# Patient Record
Sex: Female | Born: 1935
Health system: Southern US, Community
[De-identification: ages and names within clinical notes are randomized; demographics above are authoritative.]

## PROBLEM LIST (undated history)

## (undated) DIAGNOSIS — E7800 Pure hypercholesterolemia, unspecified: Secondary | ICD-10-CM

## (undated) DIAGNOSIS — J849 Interstitial pulmonary disease, unspecified: Secondary | ICD-10-CM

## (undated) DIAGNOSIS — N289 Disorder of kidney and ureter, unspecified: Secondary | ICD-10-CM

## (undated) DIAGNOSIS — J45909 Unspecified asthma, uncomplicated: Secondary | ICD-10-CM

## (undated) DIAGNOSIS — K219 Gastro-esophageal reflux disease without esophagitis: Secondary | ICD-10-CM

## (undated) DIAGNOSIS — J40 Bronchitis, not specified as acute or chronic: Secondary | ICD-10-CM

## (undated) DIAGNOSIS — E079 Disorder of thyroid, unspecified: Secondary | ICD-10-CM

## (undated) DIAGNOSIS — D509 Iron deficiency anemia, unspecified: Secondary | ICD-10-CM

## (undated) DIAGNOSIS — I219 Acute myocardial infarction, unspecified: Secondary | ICD-10-CM

## (undated) DIAGNOSIS — I10 Essential (primary) hypertension: Secondary | ICD-10-CM

## (undated) DIAGNOSIS — J449 Chronic obstructive pulmonary disease, unspecified: Secondary | ICD-10-CM

## (undated) DIAGNOSIS — I2510 Atherosclerotic heart disease of native coronary artery without angina pectoris: Secondary | ICD-10-CM

## (undated) HISTORY — DX: Iron deficiency anemia, unspecified: D50.9

## (undated) HISTORY — DX: Unspecified asthma, uncomplicated: J45.909

## (undated) HISTORY — PX: OTHER SURGICAL HISTORY: SHX169

## (undated) HISTORY — DX: Disorder of kidney and ureter, unspecified: N28.9

## (undated) HISTORY — DX: Acute myocardial infarction, unspecified: I21.9

## (undated) HISTORY — PX: CORONARY ANGIOPLASTY WITH STENT PLACEMENT: SHX49

## (undated) HISTORY — PX: APPENDECTOMY: SHX54

## (undated) HISTORY — PX: BREAST BIOPSY: SHX20

---

## 1978-10-22 HISTORY — PX: PARTIAL HYSTERECTOMY: SHX80

## 1979-09-22 HISTORY — PX: CORONARY ARTERY BYPASS GRAFT: SHX141

## 1998-12-27 ENCOUNTER — Ambulatory Visit (HOSPITAL_BASED_OUTPATIENT_CLINIC_OR_DEPARTMENT_OTHER): Admission: RE | Admit: 1998-12-27 | Discharge: 1998-12-27 | Payer: Self-pay | Admitting: Orthopedic Surgery

## 2000-07-25 ENCOUNTER — Ambulatory Visit (HOSPITAL_COMMUNITY): Admission: RE | Admit: 2000-07-25 | Discharge: 2000-07-25 | Payer: Self-pay | Admitting: Orthopedic Surgery

## 2000-07-25 ENCOUNTER — Encounter: Payer: Self-pay | Admitting: Orthopedic Surgery

## 2000-08-01 ENCOUNTER — Other Ambulatory Visit: Admission: RE | Admit: 2000-08-01 | Discharge: 2000-08-01 | Payer: Self-pay | Admitting: Orthopedic Surgery

## 2000-08-08 ENCOUNTER — Ambulatory Visit (HOSPITAL_COMMUNITY): Admission: RE | Admit: 2000-08-08 | Discharge: 2000-08-08 | Payer: Self-pay | Admitting: Orthopedic Surgery

## 2000-08-08 ENCOUNTER — Encounter: Payer: Self-pay | Admitting: Orthopedic Surgery

## 2000-08-22 ENCOUNTER — Encounter: Payer: Self-pay | Admitting: Orthopedic Surgery

## 2000-08-22 ENCOUNTER — Ambulatory Visit (HOSPITAL_COMMUNITY): Admission: RE | Admit: 2000-08-22 | Discharge: 2000-08-22 | Payer: Self-pay | Admitting: Orthopedic Surgery

## 2000-09-27 ENCOUNTER — Encounter: Payer: Self-pay | Admitting: Neurological Surgery

## 2000-10-01 ENCOUNTER — Inpatient Hospital Stay (HOSPITAL_COMMUNITY): Admission: RE | Admit: 2000-10-01 | Discharge: 2000-10-02 | Payer: Self-pay | Admitting: Neurological Surgery

## 2000-10-02 ENCOUNTER — Encounter: Payer: Self-pay | Admitting: Neurological Surgery

## 2011-10-23 ENCOUNTER — Encounter: Payer: Self-pay | Admitting: Emergency Medicine

## 2011-10-23 ENCOUNTER — Emergency Department (INDEPENDENT_AMBULATORY_CARE_PROVIDER_SITE_OTHER): Payer: Medicare Other

## 2011-10-23 ENCOUNTER — Emergency Department (HOSPITAL_BASED_OUTPATIENT_CLINIC_OR_DEPARTMENT_OTHER)
Admission: EM | Admit: 2011-10-23 | Discharge: 2011-10-23 | Disposition: A | Discharge status: Home / Self Care | Payer: Medicare Other | Attending: Emergency Medicine | Admitting: Emergency Medicine

## 2011-10-23 DIAGNOSIS — E7800 Pure hypercholesterolemia, unspecified: Secondary | ICD-10-CM | POA: Diagnosis not present

## 2011-10-23 DIAGNOSIS — I10 Essential (primary) hypertension: Secondary | ICD-10-CM | POA: Insufficient documentation

## 2011-10-23 DIAGNOSIS — R059 Cough, unspecified: Secondary | ICD-10-CM | POA: Diagnosis not present

## 2011-10-23 DIAGNOSIS — J449 Chronic obstructive pulmonary disease, unspecified: Secondary | ICD-10-CM | POA: Diagnosis not present

## 2011-10-23 DIAGNOSIS — K219 Gastro-esophageal reflux disease without esophagitis: Secondary | ICD-10-CM | POA: Diagnosis not present

## 2011-10-23 DIAGNOSIS — R5381 Other malaise: Secondary | ICD-10-CM | POA: Diagnosis not present

## 2011-10-23 DIAGNOSIS — I2510 Atherosclerotic heart disease of native coronary artery without angina pectoris: Secondary | ICD-10-CM | POA: Diagnosis not present

## 2011-10-23 DIAGNOSIS — R6889 Other general symptoms and signs: Secondary | ICD-10-CM

## 2011-10-23 DIAGNOSIS — E079 Disorder of thyroid, unspecified: Secondary | ICD-10-CM | POA: Insufficient documentation

## 2011-10-23 DIAGNOSIS — R05 Cough: Secondary | ICD-10-CM

## 2011-10-23 DIAGNOSIS — R5383 Other fatigue: Secondary | ICD-10-CM | POA: Diagnosis not present

## 2011-10-23 DIAGNOSIS — Z79899 Other long term (current) drug therapy: Secondary | ICD-10-CM | POA: Insufficient documentation

## 2011-10-23 DIAGNOSIS — R52 Pain, unspecified: Secondary | ICD-10-CM | POA: Diagnosis not present

## 2011-10-23 DIAGNOSIS — J4489 Other specified chronic obstructive pulmonary disease: Secondary | ICD-10-CM | POA: Insufficient documentation

## 2011-10-23 HISTORY — DX: Gastro-esophageal reflux disease without esophagitis: K21.9

## 2011-10-23 HISTORY — DX: Essential (primary) hypertension: I10

## 2011-10-23 HISTORY — DX: Chronic obstructive pulmonary disease, unspecified: J44.9

## 2011-10-23 HISTORY — DX: Atherosclerotic heart disease of native coronary artery without angina pectoris: I25.10

## 2011-10-23 HISTORY — DX: Bronchitis, not specified as acute or chronic: J40

## 2011-10-23 HISTORY — DX: Pure hypercholesterolemia, unspecified: E78.00

## 2011-10-23 HISTORY — DX: Disorder of thyroid, unspecified: E07.9

## 2011-10-23 MED ORDER — PREDNISONE 20 MG PO TABS: 40.0000 mg | ORAL_TABLET | Freq: Two times a day (BID) | ORAL | Status: DC

## 2011-10-23 MED ORDER — HYDROCOD POLST-CHLORPHEN POLST 10-8 MG/5ML PO LQCR: 5.0000 mL | Freq: Two times a day (BID) | ORAL | Status: DC | PRN

## 2011-10-23 NOTE — ED Notes (Addendum)
Runny nose, cough for several days.  Productive white sputum occasional.  Generalized aches.  Decreased energy.  No N/V/D.   Pt also states she has been having left lower arm pain for 2 months.

## 2011-10-23 NOTE — ED Provider Notes (Signed)
Medical screening examination/treatment/procedure(s) were performed by non-physician practitioner and as supervising physician I was immediately available for consultation/collaboration.   Forbes Cellar, MD 10/23/11 [phone]

## 2011-10-23 NOTE — ED Provider Notes (Signed)
History     CSN: [id number]  Arrival date & time 10/23/11  1423   First MD Initiated Contact with Patient 10/23/11 1529      Chief Complaint  Patient presents with  . Cough    (Consider location/radiation/quality/duration/timing/severity/associated sxs/prior treatment) HPI Comments: Pt states that she has a history of this and steroids and tessalon perles is usually what her doctor gives her:pt states that the perles are not working:pt states that she stopped smoking in 1981, but her husband is a smoker:pt states that she will intermittently have pain in her left forearm and it resolves on it own:pt is not having any pain today:pt states that they moved here about a month ago from [place] and they don't have a pcp yet  Patient is a 78 y.o. female presenting with cough. The history is provided by the patient. No language interpreter was used.  Cough This is a new problem. The current episode started more than 2 days ago. The problem occurs hourly. The problem has not changed since onset.The cough is productive of sputum. There has been no fever. Associated symptoms include rhinorrhea, sore throat and wheezing. Pertinent negatives include no headaches and no shortness of breath. She has tried decongestants and cough syrup for the symptoms. She is not a smoker.    Past Medical History  Diagnosis Date  . Bronchitis   . Hypertension   . Thyroid disease   . Coronary artery disease   . Hypercholesteremia   . GERD (gastroesophageal reflux disease)   . COPD (chronic obstructive pulmonary disease)     Past Surgical History  Procedure Date  . Coronary artery bypass graft   . Coronary angioplasty with stent placement     History reviewed. No pertinent family history.  History  Substance Use Topics  . Smoking status: Never Smoker   . Smokeless tobacco: Not on file  . Alcohol Use: Yes     occasional    OB History    Grav Para Term Preterm Abortions TAB SAB Ect Mult Living           Review of Systems  HENT: Positive for sore throat and rhinorrhea.   Respiratory: Positive for cough and wheezing. Negative for shortness of breath.   Neurological: Negative for headaches.  All other systems reviewed and are negative.    Allergies  Codeine  Home Medications   Current Outpatient Rx  Name Route Sig Dispense Refill  . IPRATROPIUM-ALBUTEROL 18-103 MCG/ACT IN AERO Inhalation Inhale 2 puffs into the lungs 4 (four) times daily.      [name] AMITRIPTYLINE HCL 25 MG PO TABS Oral Take 25 mg by mouth at bedtime.     . ASPIRIN 81 MG PO TABS Oral Take 160 mg by mouth daily.      Kimberlee Nearing PERLES PO Oral Take 1 capsule by mouth 2 (two) times daily as needed.      [name] CALCIUM CARBONATE 600 MG PO TABS Oral Take 600 mg by mouth 2 (two) times daily.      [name] CETIRIZINE HCL 10 MG PO TABS Oral Take 10 mg by mouth daily.      . COLESEVELAM HCL 625 MG PO TABS Oral Take 1,875 mg by mouth 2 (two) times daily with a meal.      . ESOMEPRAZOLE MAGNESIUM 40 MG PO CPDR Oral Take 40 mg by mouth 2 (two) times daily.      . FUROSEMIDE 40 MG PO TABS Oral Take 40 mg by mouth daily.  For fluid     . ISOSORBIDE MONONITRATE ER 60 MG PO TB24 Oral Take 60 mg by mouth daily.      [name] LEVOTHYROXINE SODIUM 150 MCG PO TABS Oral Take 150 mcg by mouth daily.      [name] METOPROLOL SUCCINATE ER 50 MG PO TB24 Oral Take 50 mg by mouth daily.      [name] POLYETHYL GLYCOL-PROPYL GLYCOL 0.4-0.3 % OP SOLN Ophthalmic Apply 1 drop to eye daily.      [name] PRAVASTATIN SODIUM 40 MG PO TABS Oral Take 40 mg by mouth at bedtime as needed.     [name] SPIRONOLACTONE-HCTZ 25-25 MG PO TABS Oral Take 1 tablet by mouth daily.      . TRAMADOL HCL 50 MG PO TABS Oral Take 50 mg by mouth 2 (two) times daily as needed. For pain. Maximum dose= 8 tablets per day    . VALSARTAN 320 MG PO TABS Oral Take 320 mg by mouth daily.        BP 135/66  Pulse 79  Temp(Src) 98.7 F (37.1 C) (Oral)  Resp 16  SpO2 96%  Physical Exam  Nursing note and vitals  reviewed. Constitutional: She is oriented to person, place, and time. She appears well-developed and well-nourished.  HENT:  Head: Normocephalic and atraumatic.  Right Ear: External ear normal.  Nose: Rhinorrhea present.  Neck: Neck supple.  Cardiovascular: Normal rate and regular rhythm.   Pulmonary/Chest: No respiratory distress. She has rales.  Musculoskeletal: Normal range of motion.  Neurological: She is alert and oriented to person, place, and time.  Skin: Skin is warm and dry.  Psychiatric: She has a normal mood and affect.    ED Course  Procedures (including critical care time)  Labs Reviewed - No data to display Dg Chest 2 View  10/23/2011  *RADIOLOGY REPORT*  Clinical Data: Cough.  Runny nose for several days.  Productive, white sputum.  Generalized aches.  Decreased energy.  History of bronchitis, hypertension, CAD, COPD.  CHEST - 2 VIEW  Comparison: None.  Findings: The patient has had median sternotomy.  Heart is upper limits normal in size.  There are no focal consolidations or pleural effusions.  No pulmonary edema.  Degenerative changes are seen in the spine.  IMPRESSION: No evidence for acute cardiopulmonary abnormality.  Original Report Authenticated By: Patterson Hammersmith, M.D.     1. Cough       MDM  No acute findings on chest x-ray:will treat symptomatically:no intervention need for the arm pain as pt is not having it today       Teressa Lower, NP 10/23/11 1609

## 2011-10-30 DIAGNOSIS — J449 Chronic obstructive pulmonary disease, unspecified: Secondary | ICD-10-CM | POA: Diagnosis not present

## 2011-10-30 DIAGNOSIS — J40 Bronchitis, not specified as acute or chronic: Secondary | ICD-10-CM | POA: Diagnosis not present

## 2011-10-30 DIAGNOSIS — I10 Essential (primary) hypertension: Secondary | ICD-10-CM | POA: Diagnosis not present

## 2011-10-30 DIAGNOSIS — I2510 Atherosclerotic heart disease of native coronary artery without angina pectoris: Secondary | ICD-10-CM | POA: Diagnosis not present

## 2011-10-30 DIAGNOSIS — E039 Hypothyroidism, unspecified: Secondary | ICD-10-CM | POA: Diagnosis not present

## 2011-10-30 DIAGNOSIS — E785 Hyperlipidemia, unspecified: Secondary | ICD-10-CM | POA: Diagnosis not present

## 2011-10-30 DIAGNOSIS — K219 Gastro-esophageal reflux disease without esophagitis: Secondary | ICD-10-CM | POA: Diagnosis not present

## 2011-11-13 DIAGNOSIS — I10 Essential (primary) hypertension: Secondary | ICD-10-CM | POA: Diagnosis not present

## 2011-11-13 DIAGNOSIS — J40 Bronchitis, not specified as acute or chronic: Secondary | ICD-10-CM | POA: Diagnosis not present

## 2012-02-12 DIAGNOSIS — L821 Other seborrheic keratosis: Secondary | ICD-10-CM | POA: Diagnosis not present

## 2012-02-12 DIAGNOSIS — D235 Other benign neoplasm of skin of trunk: Secondary | ICD-10-CM | POA: Diagnosis not present

## 2012-02-12 DIAGNOSIS — L820 Inflamed seborrheic keratosis: Secondary | ICD-10-CM | POA: Diagnosis not present

## 2012-02-12 DIAGNOSIS — L570 Actinic keratosis: Secondary | ICD-10-CM | POA: Diagnosis not present

## 2012-02-13 DIAGNOSIS — I10 Essential (primary) hypertension: Secondary | ICD-10-CM | POA: Diagnosis not present

## 2012-02-13 DIAGNOSIS — J449 Chronic obstructive pulmonary disease, unspecified: Secondary | ICD-10-CM | POA: Diagnosis not present

## 2012-02-13 DIAGNOSIS — J309 Allergic rhinitis, unspecified: Secondary | ICD-10-CM | POA: Diagnosis not present

## 2012-03-11 DIAGNOSIS — J449 Chronic obstructive pulmonary disease, unspecified: Secondary | ICD-10-CM | POA: Diagnosis not present

## 2012-03-11 DIAGNOSIS — R05 Cough: Secondary | ICD-10-CM | POA: Diagnosis not present

## 2012-03-11 DIAGNOSIS — D649 Anemia, unspecified: Secondary | ICD-10-CM | POA: Diagnosis not present

## 2012-03-11 DIAGNOSIS — I10 Essential (primary) hypertension: Secondary | ICD-10-CM | POA: Diagnosis not present

## 2012-03-11 DIAGNOSIS — R4189 Other symptoms and signs involving cognitive functions and awareness: Secondary | ICD-10-CM | POA: Diagnosis not present

## 2012-03-12 ENCOUNTER — Other Ambulatory Visit: Payer: Self-pay | Admitting: Family Medicine

## 2012-03-12 DIAGNOSIS — L570 Actinic keratosis: Secondary | ICD-10-CM | POA: Diagnosis not present

## 2012-03-12 DIAGNOSIS — R4189 Other symptoms and signs involving cognitive functions and awareness: Secondary | ICD-10-CM

## 2012-03-12 DIAGNOSIS — L821 Other seborrheic keratosis: Secondary | ICD-10-CM | POA: Diagnosis not present

## 2012-03-13 ENCOUNTER — Ambulatory Visit
Admission: RE | Admit: 2012-03-13 | Discharge: 2012-03-13 | Disposition: A | Discharge status: Home / Self Care | Payer: Medicare Other | Source: Ambulatory Visit | Attending: Family Medicine | Admitting: Family Medicine

## 2012-03-13 ENCOUNTER — Telehealth: Payer: Self-pay | Admitting: Oncology

## 2012-03-13 DIAGNOSIS — R4189 Other symptoms and signs involving cognitive functions and awareness: Secondary | ICD-10-CM

## 2012-03-13 DIAGNOSIS — I10 Essential (primary) hypertension: Secondary | ICD-10-CM | POA: Diagnosis not present

## 2012-03-13 DIAGNOSIS — I6350 Cerebral infarction due to unspecified occlusion or stenosis of unspecified cerebral artery: Secondary | ICD-10-CM | POA: Diagnosis not present

## 2012-03-13 DIAGNOSIS — I6529 Occlusion and stenosis of unspecified carotid artery: Secondary | ICD-10-CM | POA: Diagnosis not present

## 2012-03-13 DIAGNOSIS — F29 Unspecified psychosis not due to a substance or known physiological condition: Secondary | ICD-10-CM | POA: Diagnosis not present

## 2012-03-13 DIAGNOSIS — R51 Headache: Secondary | ICD-10-CM | POA: Diagnosis not present

## 2012-03-13 DIAGNOSIS — R413 Other amnesia: Secondary | ICD-10-CM | POA: Diagnosis not present

## 2012-03-13 NOTE — Telephone Encounter (Signed)
called pt and scheduled appt for 06/07.  faxed over a letter to Dr. Barbaraann Barthel

## 2012-03-14 ENCOUNTER — Telehealth: Payer: Self-pay | Admitting: Oncology

## 2012-03-14 NOTE — Telephone Encounter (Signed)
Referred by Dr. Barbaraann Barthel Dx- Chronic Anemia

## 2012-03-20 ENCOUNTER — Ambulatory Visit (INDEPENDENT_AMBULATORY_CARE_PROVIDER_SITE_OTHER): Payer: Medicare Other | Admitting: Pulmonary Disease

## 2012-03-20 ENCOUNTER — Encounter: Payer: Self-pay | Admitting: Oncology

## 2012-03-20 ENCOUNTER — Encounter: Payer: Self-pay | Admitting: Pulmonary Disease

## 2012-03-20 VITALS — BP 102/64 | HR 55 | Temp 98.1°F | Ht 63.0 in | Wt 200.2 lb

## 2012-03-20 DIAGNOSIS — R05 Cough: Secondary | ICD-10-CM | POA: Diagnosis not present

## 2012-03-20 DIAGNOSIS — N1832 Chronic kidney disease, stage 3b: Secondary | ICD-10-CM | POA: Insufficient documentation

## 2012-03-20 DIAGNOSIS — D509 Iron deficiency anemia, unspecified: Secondary | ICD-10-CM | POA: Insufficient documentation

## 2012-03-20 NOTE — Progress Notes (Signed)
  Subjective:    Patient ID: Cindy [name], female    DOB: Jul 22, 1932, 78 y.o.   MRN: [id number]  HPI PCP  [facility]- [name] 78 year old remote ex-smoker for evaluation of persistent wheezing and cough. pt c/o Wheezing and cough w/ occasional yellow-cream color phlem, sob w/ ctivity occasionally x 2 1/2 years. cough worse late afternoon and at night, wheezing last about all day. Had a CABG in 81 and takes occasional sublingual nitroglycerin at night but denies chest pain, orthopnea or paroxysmal nocturnal dyspnea. She is on Lasix for pedal edema . Tessalon Perles have not helped. Tussionex caused her to be drowsy in 911 had to be called. Chest x-ray from 03/11/2012 is reported normal. From 1/13 shows minimal scarring and prominent interstitium. She was recently given a Z-Pak. She's been using Advair for a week with some improvement. She uses Zyrtec for allergies and denies postnasal drip. She uses Nexium twice a day and denies obvious heartburn or belching.  Meds reviewed shows a regimen suggestive of heart failure. She smoked 1.5 packs per day for 25 years before quitting in 83 at the time of her bypass.  Denies occupational or environmental exposure to mold or dust. She has not been formally allergy tested. There is no childhood history of asthma  Past Medical History  Diagnosis Date  . Bronchitis   . Hypertension   . Thyroid disease   . Coronary artery disease     MI age 78  . Hypercholesteremia   . GERD (gastroesophageal reflux disease)   . COPD (chronic obstructive pulmonary disease)   . Iron deficiency anemia     used to see hem in [place], [place].   [name] Renal insufficiency   . Asthma   . Heart attack   . Skin cancer     Past Surgical History  Procedure Date  . Coronary artery bypass graft 09/1979  . Coronary angioplasty with stent placement   . Moh's surgeries     for squamous cell carcinoma  . Partial hysterectomy 10/1978  . Appendectomy     Allergies  Allergen  Reactions  . Codeine Other (See Comments)    Headache from heavy doses of codeine only     Review of Systems  Constitutional: Negative for fever, appetite change and unexpected weight change.  HENT: Positive for congestion and sneezing. Negative for ear pain, sore throat, trouble swallowing and dental problem.   Respiratory: Positive for cough, shortness of breath and wheezing.   Cardiovascular: Positive for leg swelling. Negative for chest pain and palpitations.  Gastrointestinal: Negative for abdominal pain.  Musculoskeletal: Positive for joint swelling.  Skin: Negative for rash.  Neurological: Negative for headaches.  Psychiatric/Behavioral: Negative for dysphoric mood. The patient is not nervous/anxious.        Objective:   Physical Exam  Gen. Pleasant, obese, in no distress, normal affect ENT - no lesions, no post nasal drip, class 2-3 airway Neck: No JVD, no thyromegaly, no carotid bruits Lungs: no use of accessory muscles, no dullness to percussion, decreased with bibasal L >> Rt rales no  rhonchi  Cardiovascular: Rhythm regular, heart sounds  normal, no murmurs or gallops, no peripheral edema Abdomen: soft and non-tender, no hepatosplenomegaly, BS normal. Musculoskeletal: No deformities, no cyanosis or clubbing Neuro:  alert, non focal, no tremors        Assessment & Plan:

## 2012-03-20 NOTE — Patient Instructions (Addendum)
[facility] Your cough & wheeze may be due to sinusitis or reflux or COPD You also have scarring at the bottom of oyur lungs CT chest Breathing test - PFTs Stay on advair twice daily in the meantime

## 2012-03-20 NOTE — Assessment & Plan Note (Signed)
Get records from Dr Barbaraann Barthel & dr Anne FuDeboraha Sprang - regarding her cardiac workup including echo Differential diagnosis of cough & wheeze includes upper airway cough syndrome/ sinusitis or nonacid reflux or COPD Bibasal crackles on exam and chest x-ray in the past showing prominent interstitial raises the possibility of pulmonary fibrosis. Hence a high resolution CT chest will be obtained looking for fibrosis or bronchiectasis PFTs will be obtained- airway obstruction may be due to COPD from her smoking in the past, restriction may be due to obesity a fibrosis.

## 2012-03-25 ENCOUNTER — Ambulatory Visit (INDEPENDENT_AMBULATORY_CARE_PROVIDER_SITE_OTHER)
Admission: RE | Admit: 2012-03-25 | Discharge: 2012-03-25 | Disposition: A | Discharge status: Home / Self Care | Payer: Medicare Other | Source: Ambulatory Visit | Attending: Pulmonary Disease | Admitting: Pulmonary Disease

## 2012-03-25 DIAGNOSIS — R05 Cough: Secondary | ICD-10-CM | POA: Diagnosis not present

## 2012-03-25 DIAGNOSIS — R918 Other nonspecific abnormal finding of lung field: Secondary | ICD-10-CM | POA: Diagnosis not present

## 2012-03-25 DIAGNOSIS — R062 Wheezing: Secondary | ICD-10-CM | POA: Diagnosis not present

## 2012-03-27 ENCOUNTER — Telehealth: Payer: Self-pay | Admitting: *Deleted

## 2012-03-27 ENCOUNTER — Telehealth: Payer: Self-pay | Admitting: Oncology

## 2012-03-27 DIAGNOSIS — R413 Other amnesia: Secondary | ICD-10-CM | POA: Diagnosis not present

## 2012-03-27 NOTE — Telephone Encounter (Signed)
Pt called,  States has conflict w/ her appt here tomorrow.  She cannot make it and needs to r/s.  Informed her to expect call from our scheduling dept to r/s.   I sent POF to scheduling to r/s new pt visit at pt's convenience.

## 2012-03-27 NOTE — Telephone Encounter (Signed)
called pts home [id number] that her appt on 06/07 was cancelled and to rtn call to r/s

## 2012-03-28 ENCOUNTER — Ambulatory Visit: Payer: Medicare Other | Admitting: Oncology

## 2012-03-28 ENCOUNTER — Other Ambulatory Visit (HOSPITAL_BASED_OUTPATIENT_CLINIC_OR_DEPARTMENT_OTHER): Payer: Medicare Other

## 2012-03-28 ENCOUNTER — Telehealth: Payer: Self-pay | Admitting: Oncology

## 2012-03-28 ENCOUNTER — Ambulatory Visit: Payer: Medicare Other

## 2012-03-28 ENCOUNTER — Encounter: Payer: Self-pay | Admitting: Oncology

## 2012-03-28 ENCOUNTER — Other Ambulatory Visit: Payer: Medicare Other | Admitting: Lab

## 2012-03-28 ENCOUNTER — Ambulatory Visit (HOSPITAL_BASED_OUTPATIENT_CLINIC_OR_DEPARTMENT_OTHER): Payer: Medicare Other | Admitting: Oncology

## 2012-03-28 VITALS — BP 137/74 | HR 65 | Temp 97.0°F | Ht 62.0 in | Wt 198.9 lb

## 2012-03-28 DIAGNOSIS — J984 Other disorders of lung: Secondary | ICD-10-CM | POA: Diagnosis not present

## 2012-03-28 DIAGNOSIS — I10 Essential (primary) hypertension: Secondary | ICD-10-CM

## 2012-03-28 DIAGNOSIS — N289 Disorder of kidney and ureter, unspecified: Secondary | ICD-10-CM

## 2012-03-28 DIAGNOSIS — N181 Chronic kidney disease, stage 1: Secondary | ICD-10-CM

## 2012-03-28 DIAGNOSIS — D649 Anemia, unspecified: Secondary | ICD-10-CM

## 2012-03-28 DIAGNOSIS — D509 Iron deficiency anemia, unspecified: Secondary | ICD-10-CM | POA: Diagnosis not present

## 2012-03-28 DIAGNOSIS — R413 Other amnesia: Secondary | ICD-10-CM | POA: Diagnosis not present

## 2012-03-28 DIAGNOSIS — D518 Other vitamin B12 deficiency anemias: Secondary | ICD-10-CM | POA: Diagnosis not present

## 2012-03-28 LAB — CBC WITH DIFFERENTIAL/PLATELET
BASO%: 0.8 % (ref 0.0–2.0)
Basophils Absolute: 0 10*3/uL (ref 0.0–0.1)
EOS%: 2.8 % (ref 0.0–7.0)
Eosinophils Absolute: 0.2 10*3/uL (ref 0.0–0.5)
HCT: 34.9 % (ref 34.8–46.6)
HGB: 12 g/dL (ref 11.6–15.9)
LYMPH%: 24.2 % (ref 14.0–49.7)
MCH: 31.9 pg (ref 25.1–34.0)
MCHC: 34.3 g/dL (ref 31.5–36.0)
MCV: 92.8 fL (ref 79.5–101.0)
MONO#: 0.6 10*3/uL (ref 0.1–0.9)
MONO%: 8.9 % (ref 0.0–14.0)
NEUT#: 4.2 10*3/uL (ref 1.5–6.5)
NEUT%: 63.3 % (ref 38.4–76.8)
Platelets: 209 10*3/uL (ref 145–400)
RBC: 3.76 10*6/uL (ref 3.70–5.45)
RDW: 13.1 % (ref 11.2–14.5)
WBC: 6.6 10*3/uL (ref 3.9–10.3)
lymph#: 1.6 10*3/uL (ref 0.9–3.3)

## 2012-03-28 LAB — MORPHOLOGY: PLT EST: ADEQUATE

## 2012-03-28 LAB — CHCC SMEAR

## 2012-03-28 NOTE — Progress Notes (Signed)
[facility]  [phone] [phone]     INITIAL HEMATOLOGY CONSULTATION    Referral MD:  Dr. Mayo Ao, M.D.  Reason for Referral: history of anemia of iron deficiency.     HPI:  [name] is a 78 year old woman with many medical issues.  She had history of iron deficiency and saw a hematologist in [facility] who gave her occasional iron injection.  She has previous EGD and colonoscopy which were reportedly negative.   She recently moved back to [facility] and established care with Dr. Luciana Axe.  She had regular CBC on 03/11/2012 which showed WBC 9.2; Hgb 11.9; Plt 178.  Her Cr was 1.36. Her TSH was 1.52; Ferrin was 158.  Given the mild anemia, she was kindly referred for evaluation.   Cindy [name] presented to the clinic for the first time to day with her husband.  She reported that she has mild fatigue; however, she is still independent of activities of daily living.  She has mild SOB and DOE.  She was recently seen by Pulmonary and was given a diagnosis of interstitial disease, NOS.  She has good appetite and has not had any weight loss.  In fact, she has problem losing weight.  She has mild nausea and dyspepsia depending on the kind of foods that she eats.   Patient denies headache, visual changes, confusion, drenching night sweats, palpable lymph node swelling, mucositis, odynophagia, dysphagia, nausea vomiting, jaundice, chest pain, palpitation, productive cough, gum bleeding, epistaxis, hematemesis, hemoptysis, abdominal pain, abdominal swelling, early satiety, melena, hematochezia, hematuria, skin rash, spontaneous bleeding, joint swelling, joint pain, heat or cold intolerance, bowel bladder incontinence, back pain, focal motor weakness, paresthesia, depression, suicidal or homocidal ideation, feeling hopelessness.   Past Medical History  Diagnosis Date  . Bronchitis   . Hypertension   . Thyroid disease   . Coronary artery disease     MI age  39  . Hypercholesteremia   . GERD (gastroesophageal reflux disease)   . COPD (chronic obstructive pulmonary disease)   . Iron deficiency anemia     used to see hem in [place], [place].   [name] Renal insufficiency   . Asthma   . Heart attack   . Skin cancer   :    Past Surgical History  Procedure Date  . Coronary artery bypass graft 09/1979  . Coronary angioplasty with stent placement   . Moh's surgeries     for squamous cell carcinoma  . Partial hysterectomy 10/1978  . Appendectomy   :   CURRENT MEDS: Current Outpatient Prescriptions  Medication Sig Dispense Refill  . ADVAIR DISKUS 250-50 MCG/DOSE AEPB 1 puff twice a day      . albuterol-ipratropium (COMBIVENT) 18-103 MCG/ACT inhaler Inhale 2 puffs into the lungs 4 (four) times daily.        [name] amitriptyline (ELAVIL) 25 MG tablet Take 25 mg by mouth at bedtime.       [name] aspirin 81 MG tablet Take 81 mg by mouth daily.       . calcium-vitamin D (OSCAL WITH D) 500-200 MG-UNIT per tablet Take 1 tablet by mouth daily.      . cetirizine (ZYRTEC) 10 MG tablet Take 10 mg by mouth daily.        . colesevelam (WELCHOL) 625 MG tablet Take 1,875 mg by mouth 2 (two) times daily with a meal.        . esomeprazole (NEXIUM) 40 MG capsule Take 40  mg by mouth 2 (two) times daily.        . fluticasone (FLONASE) 50 MCG/ACT nasal spray 2 puffs once a day      . furosemide (LASIX) 40 MG tablet Take 40 mg by mouth daily. For fluid       . isosorbide mononitrate (IMDUR) 60 MG 24 hr tablet Take 60 mg by mouth daily.        [name] levothyroxine (SYNTHROID, LEVOTHROID) 150 MCG tablet Take 150 mcg by mouth daily.        . metoprolol (TOPROL-XL) 50 MG 24 hr tablet Take 50 mg by mouth daily.        Bertram Gala Glycol-Propyl Glycol (SYSTANE) 0.4-0.3 % SOLN Apply 1 drop to eye daily.        . pravastatin (PRAVACHOL) 40 MG tablet Take 40 mg by mouth daily.       [name] spironolactone-hydrochlorothiazide (ALDACTAZIDE) 25-25 MG per tablet Take 1 tablet by mouth daily.        .  traMADol (ULTRAM) 50 MG tablet Take 50 mg by mouth 2 (two) times daily as needed. For pain. Maximum dose= 8 tablets per day      . valsartan (DIOVAN) 320 MG tablet Take 160 mg by mouth daily.           Allergies  Allergen Reactions  . Codeine Other (See Comments)    Headache from heavy doses of codeine only  :  Family History  Problem Relation Age of Onset  . Melanoma Sister     x2  . Heart disease Father   . Heart disease Mother   :  History   Social History  . Marital Status: Married    Spouse Name: N/A    Number of Children: 2  . Years of Education: N/A   Occupational History  . retired     retired   Social History Main Topics  . Smoking status: Former Smoker -- 1.5 packs/day for 22 years    Types: Cigarettes    Quit date: 10/23/1979  . Smokeless tobacco: Never Used  . Alcohol Use: Yes     occasional  . Drug Use: No  . Sexually Active: Not on file   Other Topics Concern  . Not on file   Social History Narrative  . No narrative on file  :  REVIEW OF SYSTEM:  The rest of the 14-point review of sytem was negative.   Exam: ECOG 1  General:  Moderately obese woman, in no acute distress.  Eyes:  no scleral icterus.  ENT:  There were no oropharyngeal lesions.  Neck was without thyromegaly.  Lymphatics:  Negative cervical, supraclavicular or axillary adenopathy.  Respiratory: lungs were clear bilaterally without wheezing or crackles.  Cardiovascular:  Regular rate and rhythm, S1/S2, without murmur, rub or gallop.  There was no pedal edema.  GI:  abdomen was soft, obese, nontender, nondistended, without organomegaly.  Muscoloskeletal:  no spinal tenderness of palpation of vertebral spine.  Skin exam was without echymosis, petichae.  Neuro exam was nonfocal.  Patient was able to get on and off exam table with minor assistance.  Gait was normal.  Patient was alerted and oriented.  Attention was good.   Language was appropriate.  Mood was normal without depression.   Speech was not pressured.  Thought content was not tangential.    LABS:  Lab Results  Component Value Date   WBC 6.6 03/28/2012   HGB 12.0 03/28/2012   HCT 34.9 03/28/2012  PLT 209 03/28/2012     Blood smear review:   I personally reviewed the patient's peripheral blood smear today.  There was isocytosis.  There was no peripheral blast.  There was no schistocytosis, spherocytosis, target cell, rouleaux formation, tear drop cell.  There was no giant platelets or platelet clumps.     ASSESSMENT AND PLAN:   1.  Anemia: - Differential:  History of iron deficiency anemia with negative GI work up.  However, her ferritin in 02/2012 was normal ruling out iron deficiency at this time.  Her Hgb today is within normal range.  Her recent slight elevation of Cr, she may have anemia of chronic kidney disease. - work up:  SPEP/light chain to rule out myeloma. - treatment:  As her Hgb is normal today, no further work up beside myeloma and treatment are needed.   2.  Chronic kidney disease stage I:  (new compared to before).  Most likely due to HTN.  Again, I sent out SPEP/light chain to rule out myeloma.  I advised her to control her BP tightly to prevent worsening of her renal function.    3.  HTN:  She is on Lasix, Metoprolol; Valsartan,  Aldactone/HCTZ per PCP.  4.  Hyperlipidemia:  She is on Pravastatin per PCP.   5.  CAD:  She is on ASA, Metoprolol; Valsartan,  Pravastatin per PCP.   6.  Interstitial lung disease:  Most likely due to history of smoking in the past; combined with possible restrictive lung disease due to obesity.  She follow up with Dr. Vassie Loll.  She is on inhalers prn.  7.  Subcm lung nodules on CT chest on 03/2012.  Recommendation per radiology was for CT chest in about 6 months.  I will defer this to Dr. Vassie Loll who follows her lung disease.     8.  Follow up:  [facility] in about 3, and 6 months.  Return visit in about 9 months.     Thank you for this referral.      The length of time of the face-to-face encounter was 45 minutes. More than 50% of time was spent counseling and coordination of care.

## 2012-03-28 NOTE — Patient Instructions (Signed)
A.  Chronic anemia:  Most likely anemia of chronic kidney disease.  You do not have evidence of iron deficiency anemia at this time (due to normal ferritin).   B.  Treatment for anemia of chronic kidney disease: - Observation with blood check in 3 and 6 months. - Follow up with [place] in about 9 months.

## 2012-03-28 NOTE — Telephone Encounter (Signed)
appts made and printed for pt aom °

## 2012-04-01 ENCOUNTER — Ambulatory Visit (INDEPENDENT_AMBULATORY_CARE_PROVIDER_SITE_OTHER): Payer: Medicare Other | Admitting: Pulmonary Disease

## 2012-04-01 DIAGNOSIS — R05 Cough: Secondary | ICD-10-CM

## 2012-04-01 LAB — KAPPA/LAMBDA LIGHT CHAINS
Kappa:Lambda Ratio: 1.29 (ref 0.26–1.65)
Lambda Free Lght Chn: 1.65 mg/dL (ref 0.57–2.63)

## 2012-04-01 LAB — PROTEIN ELECTROPHORESIS, SERUM
Albumin ELP: 59.6 % (ref 55.8–66.1)
Alpha-2-Globulin: 13.4 % — ABNORMAL HIGH (ref 7.1–11.8)
Beta 2: 4.5 % (ref 3.2–6.5)
Beta Globulin: 6.6 % (ref 4.7–7.2)
Total Protein, Serum Electrophoresis: 6.5 g/dL (ref 6.0–8.3)

## 2012-04-01 LAB — PULMONARY FUNCTION TEST

## 2012-04-01 NOTE — Progress Notes (Signed)
Please see note dated on 03/28/12

## 2012-04-01 NOTE — Progress Notes (Signed)
PFT done today. 

## 2012-04-02 DIAGNOSIS — R413 Other amnesia: Secondary | ICD-10-CM | POA: Diagnosis not present

## 2012-05-06 ENCOUNTER — Ambulatory Visit (INDEPENDENT_AMBULATORY_CARE_PROVIDER_SITE_OTHER): Payer: Medicare Other | Admitting: Pulmonary Disease

## 2012-05-06 ENCOUNTER — Encounter: Payer: Self-pay | Admitting: Pulmonary Disease

## 2012-05-06 VITALS — BP 104/60 | HR 70 | Temp 98.0°F | Ht 63.0 in | Wt 200.8 lb

## 2012-05-06 DIAGNOSIS — R911 Solitary pulmonary nodule: Secondary | ICD-10-CM

## 2012-05-06 DIAGNOSIS — J8410 Pulmonary fibrosis, unspecified: Secondary | ICD-10-CM

## 2012-05-06 DIAGNOSIS — R05 Cough: Secondary | ICD-10-CM

## 2012-05-06 DIAGNOSIS — J849 Interstitial pulmonary disease, unspecified: Secondary | ICD-10-CM

## 2012-05-06 NOTE — Patient Instructions (Addendum)
Stay on tessalon perles at night Can take DELSYM 2 tsp as needed STOP taking advair You have mild scarring at the bases of the lungs with bronchiectasis - this predisposes you to bronchitis - call us if you get a chest cold You have a small nodule in your right lower lung - repeat scan in 6 m You have a collapsed vertebra in your thoracic spine

## 2012-05-06 NOTE — Progress Notes (Signed)
  Subjective:    Patient ID: Cindy [name], female    DOB: 1932/01/08, 78 y.o.   MRN: [id number]  HPI  PCP [facility]  Cards- [name]   78 year old remote ex-smoker for evaluation of persistent wheezing and cough.  pt c/o Wheezing and cough w/ occasional yellow-cream color phlem, sob w/ ctivity occasionally x 2 1/2 years. cough worse late afternoon and at night, wheezing last about all day.  Had a CABG in 81 and takes occasional sublingual nitroglycerin at night but denies chest pain, orthopnea or paroxysmal nocturnal dyspnea. She is on Lasix for pedal edema . Tessalon Perles have not helped. Tussionex caused her to be drowsy in 911 had to be called. Chest x-ray from 03/11/2012 is reported normal. From 1/13 shows minimal scarring and prominent interstitium.  She was recently given a Z-Pak. She's been using Advair for a week with some improvement.  She uses Zyrtec for allergies and denies postnasal drip.  She uses Nexium twice a day and denies obvious heartburn or belching.  Meds reviewed shows a regimen suggestive of heart failure, no ACE.  She smoked 1.5 packs per day for 25 years before quitting in 81 at the time of her bypass.  Denies occupational or environmental exposure to mold or dust. She has not been formally allergy tested. There is no childhood history of asthma   05/06/2012 Pt states her cough is 75% better from last visit, still has some wheezing. breathing has been pretty good  HRCT Appearance of the lung parenchyma is compatible with an underlying interstitial lung disease. the pattern is nonspecific, and could represent either early usual interstitial pneumonia (UIP), or nonspecific interstitial pneumonia (NSIP). Atherosclerosis, 8mm RLL nodule PFTs no airway obstruction, preserved lung volumes, isolated decrease in DLCO to 56% corrects for Va.  Review of Systems Patient denies significant dyspnea,cough, hemoptysis,  chest pain, palpitations, pedal edema, orthopnea, paroxysmal  nocturnal dyspnea, lightheadedness, nausea, vomiting, abdominal or  leg pains      Objective:   Physical Exam  Gen. Pleasant, obese, in no distress ENT - no lesions, no post nasal drip Neck: No JVD, no thyromegaly, no carotid bruits Lungs: no use of accessory muscles, no dullness to percussion, decreased without rales or rhonchi  Cardiovascular: Rhythm regular, heart sounds  normal, no murmurs or gallops, no peripheral edema Musculoskeletal: No deformities, no cyanosis or clubbing , no tremors       Assessment & Plan:

## 2012-05-08 DIAGNOSIS — J849 Interstitial pulmonary disease, unspecified: Secondary | ICD-10-CM | POA: Insufficient documentation

## 2012-05-08 NOTE — Assessment & Plan Note (Signed)
Combination of GERD/ upper airway cough/ ? ILD vs bronchiectasis Ct nexium Tessalon prn

## 2012-05-08 NOTE — Assessment & Plan Note (Signed)
FU CT in 83m

## 2012-05-08 NOTE — Assessment & Plan Note (Addendum)
HRCT 6/13 suggestive of UIP/ NSIP with mild bronchiectasis PFTs no airway obstruction, preserved lung volumes, isolated decrease in DLCO to 56% corrects for Va Observe for now - rpt PFTs in 6 m for worsening OK to stop advair - since no obstruction

## 2012-05-13 DIAGNOSIS — J84115 Respiratory bronchiolitis interstitial lung disease: Secondary | ICD-10-CM | POA: Diagnosis not present

## 2012-05-13 DIAGNOSIS — D638 Anemia in other chronic diseases classified elsewhere: Secondary | ICD-10-CM | POA: Diagnosis not present

## 2012-05-13 DIAGNOSIS — I10 Essential (primary) hypertension: Secondary | ICD-10-CM | POA: Diagnosis not present

## 2012-05-13 DIAGNOSIS — E785 Hyperlipidemia, unspecified: Secondary | ICD-10-CM | POA: Diagnosis not present

## 2012-05-13 DIAGNOSIS — E039 Hypothyroidism, unspecified: Secondary | ICD-10-CM | POA: Diagnosis not present

## 2012-06-09 DIAGNOSIS — I10 Essential (primary) hypertension: Secondary | ICD-10-CM | POA: Diagnosis not present

## 2012-06-09 DIAGNOSIS — E785 Hyperlipidemia, unspecified: Secondary | ICD-10-CM | POA: Diagnosis not present

## 2012-06-09 DIAGNOSIS — I2510 Atherosclerotic heart disease of native coronary artery without angina pectoris: Secondary | ICD-10-CM | POA: Diagnosis not present

## 2012-06-25 ENCOUNTER — Other Ambulatory Visit: Payer: Medicare Other | Admitting: Lab

## 2012-07-09 DIAGNOSIS — E785 Hyperlipidemia, unspecified: Secondary | ICD-10-CM | POA: Diagnosis not present

## 2012-07-15 DIAGNOSIS — D044 Carcinoma in situ of skin of scalp and neck: Secondary | ICD-10-CM | POA: Diagnosis not present

## 2012-07-15 DIAGNOSIS — L570 Actinic keratosis: Secondary | ICD-10-CM | POA: Diagnosis not present

## 2012-07-15 DIAGNOSIS — L280 Lichen simplex chronicus: Secondary | ICD-10-CM | POA: Diagnosis not present

## 2012-07-15 DIAGNOSIS — L820 Inflamed seborrheic keratosis: Secondary | ICD-10-CM | POA: Diagnosis not present

## 2012-07-17 DIAGNOSIS — Z961 Presence of intraocular lens: Secondary | ICD-10-CM | POA: Diagnosis not present

## 2012-07-17 DIAGNOSIS — H4320 Crystalline deposits in vitreous body, unspecified eye: Secondary | ICD-10-CM | POA: Diagnosis not present

## 2012-07-17 DIAGNOSIS — H40019 Open angle with borderline findings, low risk, unspecified eye: Secondary | ICD-10-CM | POA: Diagnosis not present

## 2012-07-17 DIAGNOSIS — H04129 Dry eye syndrome of unspecified lacrimal gland: Secondary | ICD-10-CM | POA: Diagnosis not present

## 2012-08-02 IMAGING — CT CT CHEST W/O CM
3 of 7 series · 13 of 36 positions shown, 15 images · non-contrast
Comparison: No priors.

CLINICAL DATA: History of chronic cough and wheezing for the past
few years.  Evaluate for pulmonary fibrosis or bronchiectasis.

CT CHEST WITHOUT CONTRAST
TECHNIQUE: Multidetector CT imaging of the chest was performed
following the standard protocol without IV contrast.

[Series 5: lung · axial · 0.66mm/px · z∈[-234,-44]mm · 7 of 52 slices shown, 9 images]
[im 7/52  mediastinal]
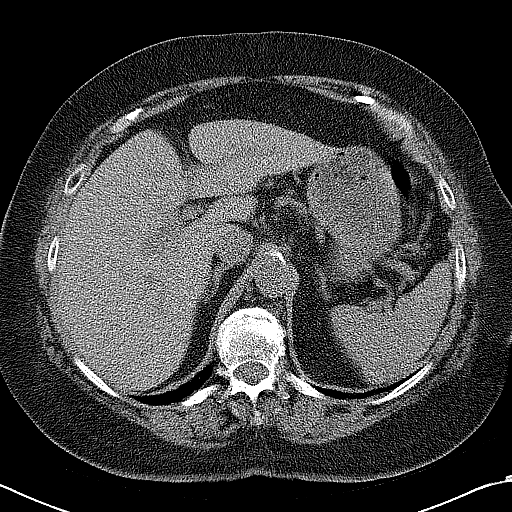
[im 7/52  lung]
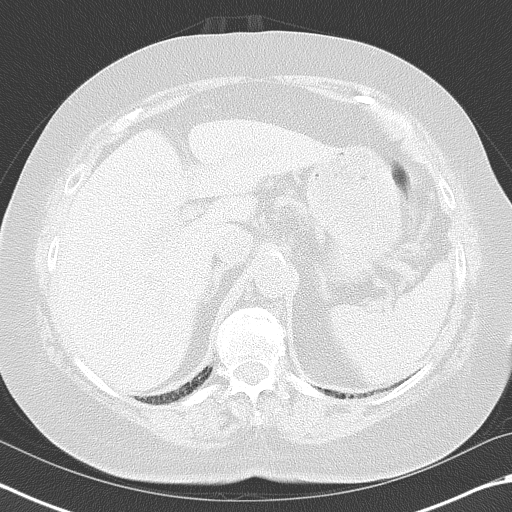
[im 13/52  lung]
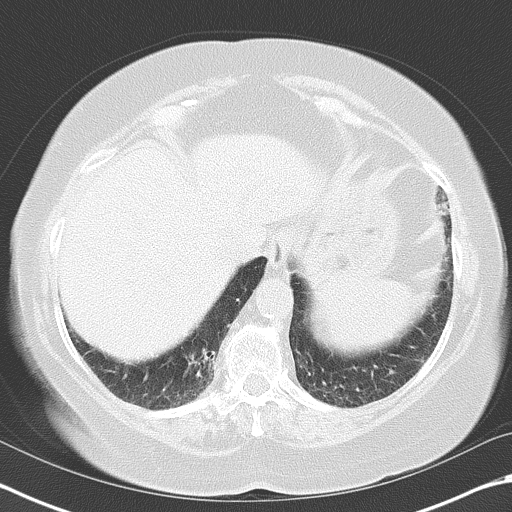
[im 20/52  lung]
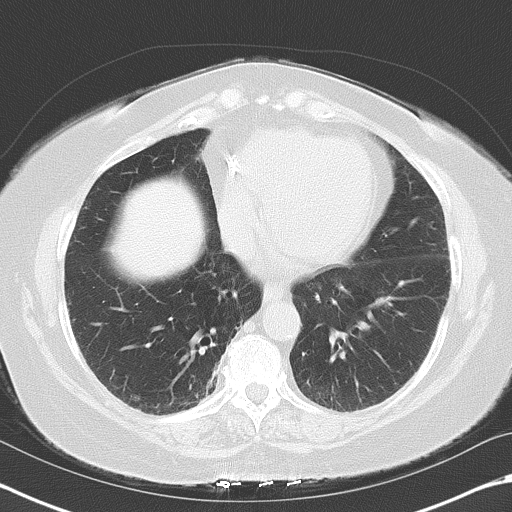
[im 26/52  lung]
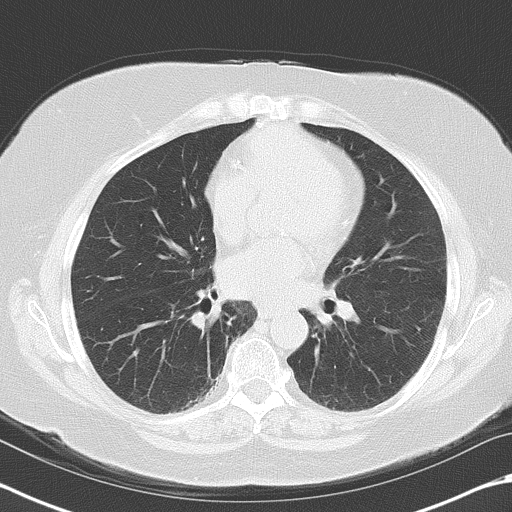
[im 32/52  mediastinal]
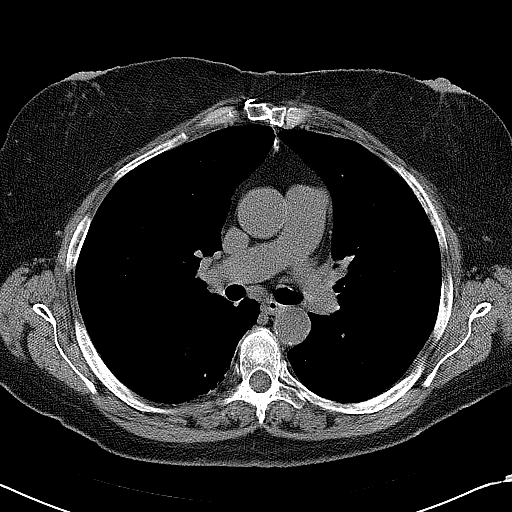
[im 32/52  lung]
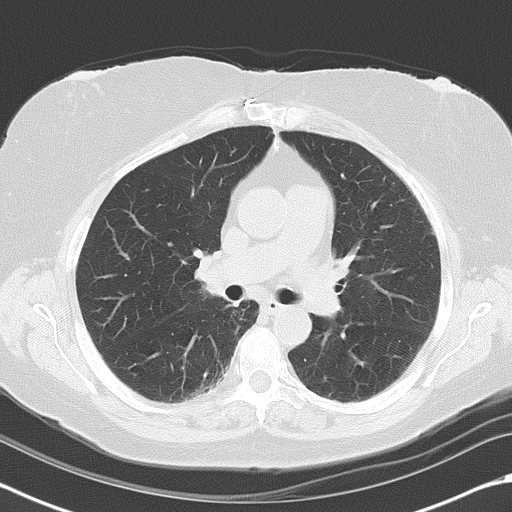
[im 39/52  lung]
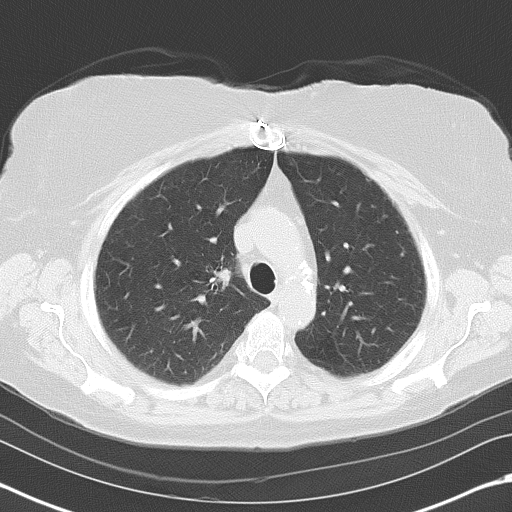
[im 45/52  lung]
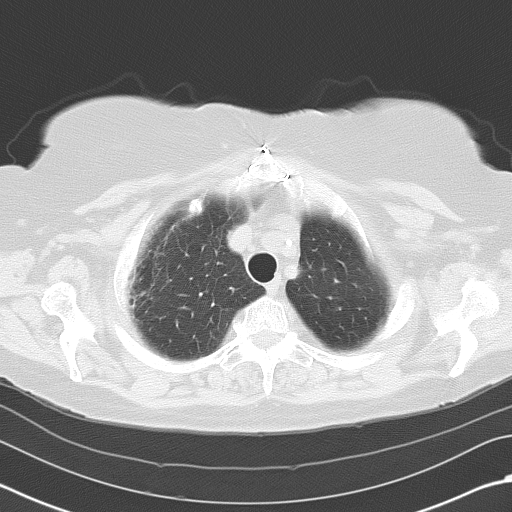

[Series 8: hires retro entire lungs · axial · 0.66mm/px · z∈[-204,-74]mm · 3 of 27 slices shown]
[im 7/27  lung]
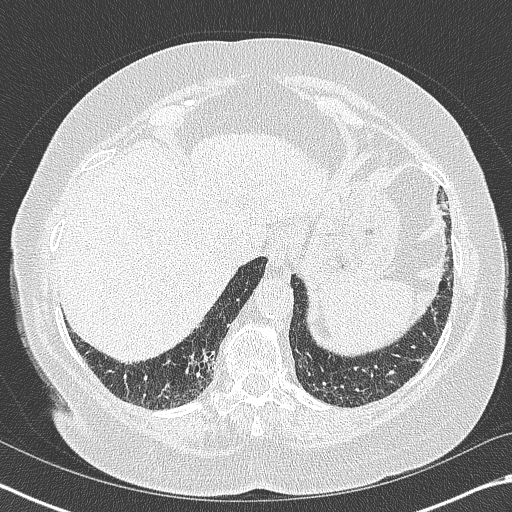
[im 14/27  lung]
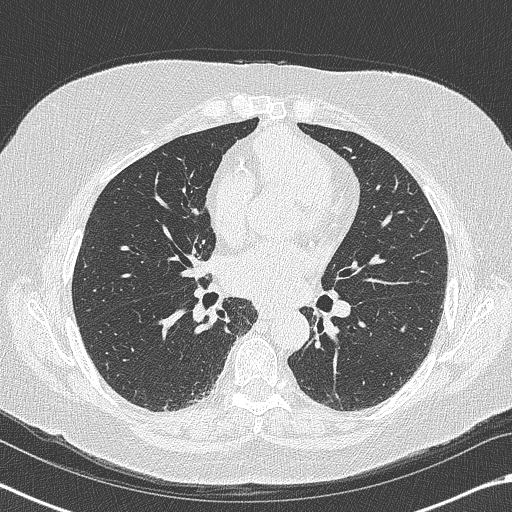
[im 20/27  lung]
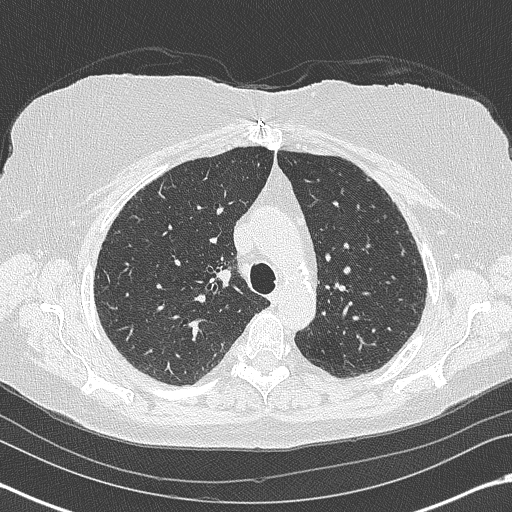

[Series 602: cor · coronal · 0.66mm/px · 3 of 108 slices shown]
[im 22/108  lung]
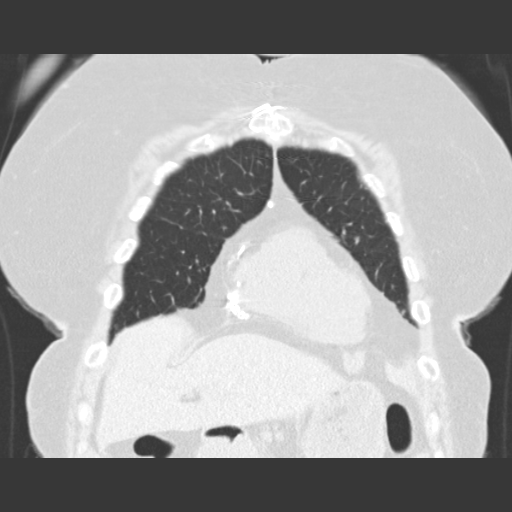
[im 43/108  lung]
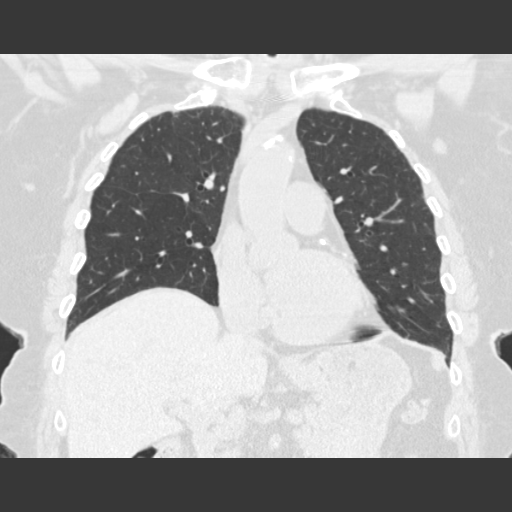
[im 65/108  lung]
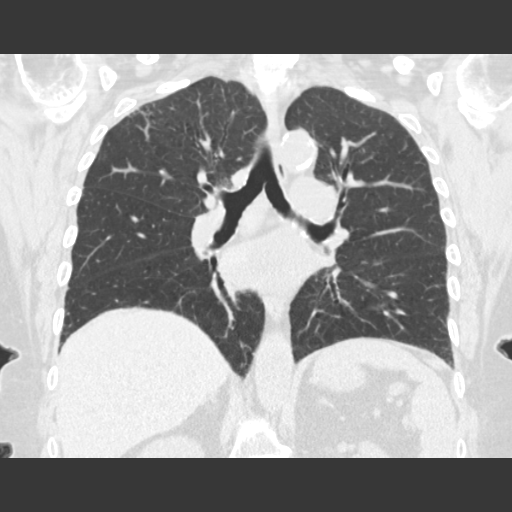

[13 of 36 positions shown; findings below may reference images not displayed]

FINDINGS: Mediastinum: Heart size is normal. There is no significant
pericardial fluid, thickening or pericardial calcification. There
is atherosclerosis of the thoracic aorta, the great vessels of the
mediastinum and the coronary arteries, including calcified
atherosclerotic plaque in the left main, left anterior descending,
left circumflex and right coronary arteries. The patient is status
post median sternotomy for CABG.  There is a bypass graft which has
a numerous stones within the graft extending to the RCA
distribution. No pathologically enlarged mediastinal or hilar lymph
nodes. Please note that accurate exclusion of hilar adenopathy is
limited on noncontrast CT scans.  Borderline enlarged low right
paratracheal lymph node measures 9 mm in short axis and
demonstrates a normal appearing fatty hilum.  Esophagus is
unremarkable in appearance.

Lungs/Pleura: There are patchy areas of peripheral subpleural
reticulation that has a craniocaudal gradient, most severe in the
lung bases bilaterally.  No areas of frank honeycombing are
appreciated at this time.  Some mild bibasilar traction
bronchiectasis and scattered areas of peripheral bronchiolectasis
are noted.  No significant regions of ground-glass attenuation are
appreciated.  There are a few scattered sub centimeter pulmonary
nodules, the largest of which is in the posterior aspect of the
right lower lobe (image 32 of series 5) measuring 8 mm.  No acute
consolidative airspace disease.  No pleural effusions.  Inspiratory
and excretory imaging is unremarkable.

Upper Abdomen: Atherosclerosis.  Otherwise, unremarkable.

Musculoskeletal: Median sternotomy wires. There are no aggressive
appearing lytic or blastic lesions noted in the visualized portions
of the skeleton.  On the sagittal reconstructions there is a
compression fracture of T12 with approximately 20% loss of anterior
vertebral body height.  Additionally, there is 8 mm of
retrolisthesis of T12 upon L1.
IMPRESSION: 1.  Appearance of the lung parenchyma is compatible with an
underlying interstitial lung disease.  At this point, the pattern
is nonspecific, and could represent either early usual interstitial
pneumonia (UIP), or nonspecific interstitial pneumonia (NSIP).

2. Atherosclerosis, including left main and three-vessel coronary
artery disease. Please note that although the presence of coronary
artery calcium documents the presence of coronary artery disease,
the severity of this disease and any potential stenosis cannot be
assessed on this non-gated CT examination. The patient is status
post median sternotomy for CABG.
3.  Old compression fracture at T12 with approximately 20% loss of
anterior vertebral body height, and 8 mm of retrolisthesis T12 upon
L1 is noted.
4.  Scattered small pulmonary nodules, largest of which measures 8
mm in the posterior aspect of the right lower lobe. If the patient
is at high risk for bronchogenic carcinoma, follow-up chest CT at 3-
6 months is recommended.  If the patient is at low risk for
bronchogenic carcinoma, follow-up chest CT at 6-12 months is
recommended.  This recommendation follows the consensus statement:
Guidelines for Management of Small Pulmonary Nodules Detected on CT
Scans: A Statement from the [HOSPITAL] as published in

## 2012-08-05 DIAGNOSIS — L280 Lichen simplex chronicus: Secondary | ICD-10-CM | POA: Diagnosis not present

## 2012-08-05 DIAGNOSIS — L988 Other specified disorders of the skin and subcutaneous tissue: Secondary | ICD-10-CM | POA: Diagnosis not present

## 2012-08-29 DIAGNOSIS — L570 Actinic keratosis: Secondary | ICD-10-CM | POA: Diagnosis not present

## 2012-09-15 ENCOUNTER — Telehealth: Payer: Self-pay | Admitting: Pulmonary Disease

## 2012-09-15 ENCOUNTER — Other Ambulatory Visit: Payer: Self-pay | Admitting: Family Medicine

## 2012-09-15 DIAGNOSIS — E785 Hyperlipidemia, unspecified: Secondary | ICD-10-CM | POA: Diagnosis not present

## 2012-09-15 DIAGNOSIS — Z23 Encounter for immunization: Secondary | ICD-10-CM | POA: Diagnosis not present

## 2012-09-15 DIAGNOSIS — E039 Hypothyroidism, unspecified: Secondary | ICD-10-CM | POA: Diagnosis not present

## 2012-09-15 DIAGNOSIS — J479 Bronchiectasis, uncomplicated: Secondary | ICD-10-CM | POA: Diagnosis not present

## 2012-09-15 DIAGNOSIS — D649 Anemia, unspecified: Secondary | ICD-10-CM | POA: Diagnosis not present

## 2012-09-15 DIAGNOSIS — I10 Essential (primary) hypertension: Secondary | ICD-10-CM | POA: Diagnosis not present

## 2012-09-15 DIAGNOSIS — I6529 Occlusion and stenosis of unspecified carotid artery: Secondary | ICD-10-CM | POA: Diagnosis not present

## 2012-09-15 NOTE — Telephone Encounter (Signed)
Pt last seen in July 2013. She c/o cough and congestion for several weeks now and when she saw Dr. Luciana Axe today, she was told to contact Dr. Vassie Loll. Pt is coming in to see KC in the morning, Tues., 09/16/12 @ 10:45. She will seek emergency help ig her sxs become worse in the meantime.

## 2012-09-16 ENCOUNTER — Encounter: Payer: Self-pay | Admitting: Pulmonary Disease

## 2012-09-16 ENCOUNTER — Ambulatory Visit (INDEPENDENT_AMBULATORY_CARE_PROVIDER_SITE_OTHER): Payer: Medicare Other | Admitting: Pulmonary Disease

## 2012-09-16 VITALS — BP 90/60 | HR 65 | Temp 97.7°F | Ht 64.0 in | Wt 194.4 lb

## 2012-09-16 DIAGNOSIS — J309 Allergic rhinitis, unspecified: Secondary | ICD-10-CM | POA: Insufficient documentation

## 2012-09-16 DIAGNOSIS — R05 Cough: Secondary | ICD-10-CM

## 2012-09-16 DIAGNOSIS — J310 Chronic rhinitis: Secondary | ICD-10-CM | POA: Diagnosis not present

## 2012-09-16 MED ORDER — BENZONATATE 100 MG PO CAPS: 100.0000 mg | ORAL_CAPSULE | Freq: Every day | ORAL | Status: DC

## 2012-09-16 NOTE — Assessment & Plan Note (Signed)
The patient's symptoms are most consistent with rhinitis, possibly allergic in origin.  I cannot rule out an acute sinusitis, but I would like to treat her conservatively first.  I have recommended changing her antihistamine, and using Nasonex on a more consistent basis.  I have also recommended sinus rinses with a neilmed kit.  She is to call us if she has purulence from her nose, or if her symptoms worsen.

## 2012-09-16 NOTE — Addendum Note (Signed)
Addended by: Ozella Almond R on: 09/16/2012 02:00 PM   Modules accepted: Orders

## 2012-09-16 NOTE — Progress Notes (Signed)
  Subjective:    Patient ID: Cindy [name], female    DOB: 25-Jan-1932, 78 y.o.   MRN: [id number]  HPI The patient comes in today for an acute sick visit.  She is normally followed by Dr. Vassie Loll, and gives a one-week history of itchy eyes and nose, worsening postnasal drip, rhinorrhea, and increased cough with classic upper airway pseudo-wheezing.  She has not had increased shortness of breath, nor does she feels she is developing a chest cold.  She will produce slightly discolored mucus at times from her throat, but she is not blowing purulent mucus from her nose.  She is having some sinus pressure.   Review of Systems  Constitutional: Negative for fever and unexpected weight change.  HENT: Positive for congestion, rhinorrhea, sneezing, dental problem and postnasal drip. Negative for ear pain, nosebleeds, sore throat, trouble swallowing and sinus pressure.   Eyes: Negative for redness and itching.  Respiratory: Positive for cough, shortness of breath and wheezing. Negative for chest tightness.   Cardiovascular: Negative for palpitations and leg swelling.  Gastrointestinal: Negative for nausea and vomiting.  Genitourinary: Negative for dysuria.  Musculoskeletal: Negative for joint swelling.  Skin: Negative for rash.  Neurological: Negative for headaches.  Hematological: Bruises/bleeds easily.  Psychiatric/Behavioral: Negative for dysphoric mood. The patient is not nervous/anxious.        Objective:   Physical Exam Overweight female in no acute distress Nose with deviated septum to the left with narrowing, inflamed mucosa bilaterally with no purulence Oropharynx clear with no exudates Neck without lymphadenopathy or thyromegaly Chest totally clear except for a few patchy crackles. Cardiac exam with regular rate and rhythm Lower extremities with mild edema, no cyanosis Alert and oriented, moves all 4 extremities.       Assessment & Plan:

## 2012-09-16 NOTE — Patient Instructions (Addendum)
Stay on nasonex nasal spray every day as directed. Stop zyrtec, and start chlorpheniramine 4mg  otc  And take 2 at bedtime and one at lunch each day. Try neilmed sinus rinses am and pm.  I have had a lot of good feedback from patients this really helps.  followup with Dr. Vassie Loll if you do not improve, and call us if you begin to produce discolored mucus from your nose.

## 2012-09-22 ENCOUNTER — Ambulatory Visit
Admission: RE | Admit: 2012-09-22 | Discharge: 2012-09-22 | Disposition: A | Discharge status: Home / Self Care | Payer: Medicare Other | Source: Ambulatory Visit | Attending: Family Medicine | Admitting: Family Medicine

## 2012-09-22 DIAGNOSIS — I6529 Occlusion and stenosis of unspecified carotid artery: Secondary | ICD-10-CM

## 2012-09-22 DIAGNOSIS — I658 Occlusion and stenosis of other precerebral arteries: Secondary | ICD-10-CM | POA: Diagnosis not present

## 2012-09-25 ENCOUNTER — Ambulatory Visit (INDEPENDENT_AMBULATORY_CARE_PROVIDER_SITE_OTHER)
Admission: RE | Admit: 2012-09-25 | Discharge: 2012-09-25 | Disposition: A | Discharge status: Home / Self Care | Payer: Medicare Other | Source: Ambulatory Visit | Attending: Pulmonary Disease | Admitting: Pulmonary Disease

## 2012-09-25 DIAGNOSIS — J8410 Pulmonary fibrosis, unspecified: Secondary | ICD-10-CM | POA: Diagnosis not present

## 2012-09-25 DIAGNOSIS — J438 Other emphysema: Secondary | ICD-10-CM | POA: Diagnosis not present

## 2012-09-25 DIAGNOSIS — R911 Solitary pulmonary nodule: Secondary | ICD-10-CM

## 2012-09-29 ENCOUNTER — Telehealth: Payer: Self-pay | Admitting: *Deleted

## 2012-09-29 NOTE — Telephone Encounter (Signed)
Pt called to cancel lab appt on 12/11.  She just had labs done at [facility] on 11/26 which included a CBC w/ diff, CMET and Ferritn.  Called pt back and asked her to request [facility] fax [place] the CBC for Dr. Lodema Pilot review.  Informed her will cancel lab here on 12/11.  Pt verbalized understanding.

## 2012-09-30 ENCOUNTER — Telehealth: Payer: Self-pay | Admitting: Oncology

## 2012-09-30 NOTE — Progress Notes (Signed)
Received lab results from [facility]; forwarded to Dr. Gaylyn Rong.

## 2012-09-30 NOTE — Telephone Encounter (Signed)
Per 12.9.13 pof cancel 12.11.13 lab...Marland Kitchenpof states that pt is aware

## 2012-10-01 ENCOUNTER — Other Ambulatory Visit: Payer: Medicare Other | Admitting: Lab

## 2012-10-08 ENCOUNTER — Telehealth: Payer: Self-pay | Admitting: Pulmonary Disease

## 2012-10-08 NOTE — Telephone Encounter (Signed)
Pl see result note 

## 2012-10-08 NOTE — Telephone Encounter (Signed)
Result Note     Similar bilateral pulmonary nodules. No new or enlarging nodules. follow-up at 6 months is recommended.    Will discuss more on OV   ---------  I spoke with patient about results and she verbalized understanding and had no questions.

## 2012-10-08 NOTE — Telephone Encounter (Signed)
Pt is requesting her CT results from 09/25/12. Please advise Dr. Vassie Loll thanks

## 2012-10-23 ENCOUNTER — Ambulatory Visit: Payer: Medicare Other | Admitting: Pulmonary Disease

## 2012-10-24 ENCOUNTER — Ambulatory Visit: Payer: Medicare Other | Admitting: Pulmonary Disease

## 2012-11-21 ENCOUNTER — Ambulatory Visit (INDEPENDENT_AMBULATORY_CARE_PROVIDER_SITE_OTHER): Payer: Medicare Other | Admitting: Pulmonary Disease

## 2012-11-21 ENCOUNTER — Encounter: Payer: Self-pay | Admitting: Pulmonary Disease

## 2012-11-21 VITALS — BP 126/72 | HR 64 | Temp 97.5°F | Ht 63.0 in | Wt 201.6 lb

## 2012-11-21 DIAGNOSIS — R911 Solitary pulmonary nodule: Secondary | ICD-10-CM | POA: Diagnosis not present

## 2012-11-21 DIAGNOSIS — J310 Chronic rhinitis: Secondary | ICD-10-CM

## 2012-11-21 DIAGNOSIS — J849 Interstitial pulmonary disease, unspecified: Secondary | ICD-10-CM

## 2012-11-21 DIAGNOSIS — J8410 Pulmonary fibrosis, unspecified: Secondary | ICD-10-CM | POA: Diagnosis not present

## 2012-11-21 NOTE — Progress Notes (Signed)
  Subjective:    Patient ID: Cindy [name], female    DOB: 1932/03/05, 79 y.o.   MRN: [id number]  HPI PCP [facility]  Cards- [name]   79 year old remote ex-smoker for FU of persistent wheezing and cough.  pt c/o Wheezing and cough w/ occasional yellow-cream color phlem, sob w/ activity occasionally x 2 1/2 years. cough worse late afternoon and at night, wheezing last about all day.  Had a CABG in 83 and takes occasional sublingual nitroglycerin at night but denies chest pain, orthopnea or paroxysmal nocturnal dyspnea. She is on Lasix for pedal edema . Tessalon Perles have not helped. Tussionex caused her to be drowsy in 911 had to be called. Chest x-ray from 03/11/2012 is reported normal. From 1/13 shows minimal scarring and prominent interstitium.  She was recently given a Z-Pak. She's been using Advair for a week with some improvement.  She uses Zyrtec for allergies and denies postnasal drip.  She uses Nexium twice a day and denies obvious heartburn or belching.  Meds reviewed shows a regimen suggestive of heart failure, no ACE.  She smoked 1.5 packs per day for 25 years before quitting in 83 at the time of her bypass.  Denies occupational or environmental exposure to mold or dust. She has not been formally allergy tested. There is no childhood history of asthma   05/06/2012  cough is 75% better from last visit, still has some wheezing. breathing has been pretty good  HRCT Appearance of the lung parenchyma is compatible with an underlying interstitial lung disease. the pattern is nonspecific, and could represent either early usual interstitial pneumonia (UIP), or nonspecific interstitial pneumonia (NSIP). Atherosclerosis, 8mm RLL nodule  PFTs no airway obstruction, preserved lung volumes, isolated decrease in DLCO to 56% corrects for Va.     11/21/2012 CT 12/13 Similar bilateral pulmonary nodules. No new or enlarging nodules  cough is better right now. c/o "terrible" wheezing per spouse,  runny nose "like a faucet", occasional PND( some days worse than others)..  No increase SOB  Past Surgical History  Procedure Date  . Coronary artery bypass graft 09/1979  . Coronary angioplasty with stent placement   . Moh's surgeries     for squamous cell carcinoma  . Partial hysterectomy 10/1978  . Appendectomy      Review of Systems neg for any significant sore throat, dysphagia, itching, sneezing, nasal congestion or excess/ purulent secretions, fever, chills, sweats, unintended wt loss, pleuritic or exertional cp, hempoptysis, orthopnea pnd or change in chronic leg swelling. Also denies presyncope, palpitations, heartburn, abdominal pain, nausea, vomiting, diarrhea or change in bowel or urinary habits, dysuria,hematuria, rash, arthralgias, visual complaints, headache, numbness weakness or ataxia.     Objective:   Physical Exam  Gen. Pleasant, obese, in no distress ENT - no lesions, no post nasal drip Neck: No JVD, no thyromegaly, no carotid bruits Lungs: no use of accessory muscles, no dullness to percussion, decreased without rales or rhonchi  Cardiovascular: Rhythm regular, heart sounds  normal, no murmurs or gallops, no peripheral edema Musculoskeletal: No deformities, no cyanosis or clubbing , no tremors       Assessment & Plan:

## 2012-11-21 NOTE — Patient Instructions (Signed)
Trial of nasonex each nare twice daily If this does not help- call us back for referral to ENT Nodules are stable- FU CT in sep 2014

## 2012-11-21 NOTE — Assessment & Plan Note (Signed)
Persistent upper airway wheezing Trial of nasonex instead of flonase - if persists will try atrovent for vasomotor symptoms & refer to ENT

## 2012-11-21 NOTE — Assessment & Plan Note (Signed)
HRCT 6/13 suggestive of UIP/ NSIP with mild bronchiectasis PFTs no airway obstruction, preserved lung volumes, isolated decrease in DLCO to 56% corrects for Va. No evidence of progression on fu CT

## 2012-11-21 NOTE — Assessment & Plan Note (Signed)
FU CT in 9/14

## 2012-12-08 DIAGNOSIS — I2510 Atherosclerotic heart disease of native coronary artery without angina pectoris: Secondary | ICD-10-CM | POA: Diagnosis not present

## 2012-12-08 DIAGNOSIS — I10 Essential (primary) hypertension: Secondary | ICD-10-CM | POA: Diagnosis not present

## 2012-12-08 DIAGNOSIS — E785 Hyperlipidemia, unspecified: Secondary | ICD-10-CM | POA: Diagnosis not present

## 2012-12-08 DIAGNOSIS — E669 Obesity, unspecified: Secondary | ICD-10-CM | POA: Diagnosis not present

## 2012-12-17 ENCOUNTER — Telehealth: Payer: Self-pay | Admitting: Pulmonary Disease

## 2012-12-17 MED ORDER — IPRATROPIUM-ALBUTEROL 20-100 MCG/ACT IN AERS: 1.0000 | INHALATION_SPRAY | Freq: Four times a day (QID) | RESPIRATORY_TRACT | Status: DC | PRN

## 2012-12-17 NOTE — Telephone Encounter (Signed)
I spoke with pt and is aware combivent has been sent. Nothing further was needed

## 2012-12-24 ENCOUNTER — Telehealth: Payer: Self-pay | Admitting: Oncology

## 2012-12-24 ENCOUNTER — Other Ambulatory Visit (HOSPITAL_BASED_OUTPATIENT_CLINIC_OR_DEPARTMENT_OTHER): Payer: Medicare Other | Admitting: Lab

## 2012-12-24 ENCOUNTER — Ambulatory Visit (HOSPITAL_BASED_OUTPATIENT_CLINIC_OR_DEPARTMENT_OTHER): Payer: Medicare Other | Admitting: Oncology

## 2012-12-24 VITALS — BP 142/73 | HR 72 | Temp 97.6°F | Resp 20 | Ht 63.0 in | Wt 197.4 lb

## 2012-12-24 DIAGNOSIS — D509 Iron deficiency anemia, unspecified: Secondary | ICD-10-CM | POA: Diagnosis not present

## 2012-12-24 DIAGNOSIS — I10 Essential (primary) hypertension: Secondary | ICD-10-CM

## 2012-12-24 DIAGNOSIS — R911 Solitary pulmonary nodule: Secondary | ICD-10-CM | POA: Diagnosis not present

## 2012-12-24 DIAGNOSIS — Z1231 Encounter for screening mammogram for malignant neoplasm of breast: Secondary | ICD-10-CM

## 2012-12-24 DIAGNOSIS — Z862 Personal history of diseases of the blood and blood-forming organs and certain disorders involving the immune mechanism: Secondary | ICD-10-CM | POA: Diagnosis not present

## 2012-12-24 DIAGNOSIS — N181 Chronic kidney disease, stage 1: Secondary | ICD-10-CM | POA: Diagnosis not present

## 2012-12-24 LAB — COMPREHENSIVE METABOLIC PANEL (CC13)
ALT: 15 U/L (ref 0–55)
Alkaline Phosphatase: 82 U/L (ref 40–150)
CO2: 28 mEq/L (ref 22–29)
Creatinine: 1.1 mg/dL (ref 0.6–1.1)
Sodium: 139 mEq/L (ref 136–145)
Total Bilirubin: 0.33 mg/dL (ref 0.20–1.20)
Total Protein: 7.2 g/dL (ref 6.4–8.3)

## 2012-12-24 LAB — CBC WITH DIFFERENTIAL/PLATELET
BASO%: 0.8 % (ref 0.0–2.0)
HCT: 36.6 % (ref 34.8–46.6)
LYMPH%: 31.2 % (ref 14.0–49.7)
MCH: 31.9 pg (ref 25.1–34.0)
MCHC: 35.3 g/dL (ref 31.5–36.0)
MCV: 90.6 fL (ref 79.5–101.0)
MONO#: 0.5 10*3/uL (ref 0.1–0.9)
MONO%: 9.9 % (ref 0.0–14.0)
NEUT%: 52.2 % (ref 38.4–76.8)
Platelets: 180 10*3/uL (ref 145–400)
RBC: 4.04 10*6/uL (ref 3.70–5.45)

## 2012-12-24 LAB — IRON AND TIBC
%SAT: 19 % — ABNORMAL LOW (ref 20–55)
Iron: 70 ug/dL (ref 42–145)
TIBC: 361 ug/dL (ref 250–470)

## 2012-12-24 LAB — FERRITIN: Ferritin: 114 ng/mL (ref 10–291)

## 2012-12-24 NOTE — Progress Notes (Signed)
[facility]  [phone] [phone]   OFFICE PROGRESS NOTE   Cc:  Beverley Fiedler, MD  DIAGNOSIS: history of iron-deficiency anemia.   PAST THERAPY:  IV iron.  She did not have response to oral iron.   CURRENT THERAPY:  watchful observation.   INTERVAL HISTORY: Cindy [name] 79 y.o. female returns for regular follow up with her husband.  She reports feeling slightly fatigue.  However, she is independent of all activities of daily living.  She denied ice-pica.  She denied any visible source of bleeding.   Patient denies fever, anorexia, weight loss, headache, visual changes, confusion, drenching night sweats, palpable lymph node swelling, mucositis, odynophagia, dysphagia, nausea vomiting, jaundice, chest pain, palpitation, shortness of breath, dyspnea on exertion, productive cough, gum bleeding, epistaxis, hematemesis, hemoptysis, abdominal pain, abdominal swelling, early satiety, melena, hematochezia, hematuria, skin rash, spontaneous bleeding, joint swelling, joint pain, heat or cold intolerance, bowel bladder incontinence, back pain, focal motor weakness, paresthesia, depression.    Past Medical History  Diagnosis Date  . Bronchitis   . Hypertension   . Thyroid disease   . Coronary artery disease     MI age 6  . Hypercholesteremia   . GERD (gastroesophageal reflux disease)   . COPD (chronic obstructive pulmonary disease)   . Iron deficiency anemia     used to see hem in [place], [place].   [name] Renal insufficiency   . Asthma   . Heart attack   . Skin cancer     Past Surgical History  Procedure Laterality Date  . Coronary artery bypass graft  09/1979  . Coronary angioplasty with stent placement    . Moh's surgeries      for squamous cell carcinoma  . Partial hysterectomy  10/1978  . Appendectomy      Current Outpatient Prescriptions  Medication Sig Dispense Refill  . amitriptyline (ELAVIL) 25 MG tablet Take 25 mg by mouth at bedtime.        [name] aspirin 81 MG tablet Take 81 mg by mouth daily.       . benzonatate (TESSALON) 100 MG capsule Take 1 capsule (100 mg total) by mouth at bedtime.  20 capsule  2  . calcium-vitamin D (OSCAL WITH D) 500-200 MG-UNIT per tablet Take 1 tablet by mouth daily.      . colesevelam (WELCHOL) 625 MG tablet 3 tablets twice a day      . esomeprazole (NEXIUM) 40 MG capsule Take 40 mg by mouth 2 (two) times daily.        . furosemide (LASIX) 40 MG tablet Take 40 mg by mouth daily. For fluid       . Ipratropium-Albuterol (COMBIVENT RESPIMAT) 20-100 MCG/ACT AERS respimat Inhale 1 puff into the lungs 4 (four) times daily as needed.  1 Inhaler  5  . isosorbide mononitrate (IMDUR) 60 MG 24 hr tablet Take 60 mg by mouth daily.        [name] levothyroxine (SYNTHROID, LEVOTHROID) 150 MCG tablet Take 150 mcg by mouth daily.        . metoprolol (TOPROL-XL) 50 MG 24 hr tablet Take 50 mg by mouth daily.        Bertram Gala Glycol-Propyl Glycol (SYSTANE) 0.4-0.3 % SOLN Apply 1 drop to eye daily.        . pravastatin (PRAVACHOL) 40 MG tablet Take 40 mg by mouth daily.       [name] spironolactone-hydrochlorothiazide (ALDACTAZIDE) 25-25 MG per tablet Take 1 tablet by mouth daily.        [name]  traMADol (ULTRAM) 50 MG tablet Take 50 mg by mouth 2 (two) times daily as needed. For pain. Maximum dose= 8 tablets per day      . valsartan (DIOVAN) 320 MG tablet Take 160 mg by mouth daily.       [name] ZETIA 10 MG tablet Take 10 mg by mouth daily.        No current facility-administered medications for this visit.    ALLERGIES:  is allergic to codeine.  REVIEW OF SYSTEMS:  The rest of the 14-point review of system was negative.   Filed Vitals:   12/24/12 1028  BP: 142/73  Pulse: 72  Temp: 97.6 F (36.4 C)  Resp: 20   Wt Readings from Last 3 Encounters:  12/24/12 197 lb 6.4 oz (89.54 kg)  11/21/12 201 lb 9.6 oz (91.445 kg)  09/16/12 194 lb 6.4 oz (88.179 kg)   ECOG Performance status: 1  PHYSICAL EXAMINATION:   General:   Mildly obese woman, in no acute distress.  Eyes:  no scleral icterus.  ENT:  There were no oropharyngeal lesions.  Neck was without thyromegaly.  Lymphatics:  Negative cervical, supraclavicular or axillary adenopathy.  Respiratory: lungs were clear bilaterally without wheezing or crackles.  Cardiovascular:  Regular rate and rhythm, S1/S2, without murmur, rub or gallop.  There was no pedal edema.  GI:  abdomen was soft, flat, nontender, nondistended, without organomegaly.  Muscoloskeletal:  no spinal tenderness of palpation of vertebral spine.  Skin exam was without echymosis, petichae.  Neuro exam was nonfocal.  Patient was able to get on and off exam table without assistance.  Gait was normal.  Patient was alerted and oriented.  Attention was good.   Language was appropriate.  Mood was normal without depression.  Speech was not pressured.  Thought content was not tangential.  Bilateral breast exam did not show any breast mass, erythema, purulent discharge, nipple inversion.      LABORATORY/RADIOLOGY DATA:  Lab Results  Component Value Date   WBC 5.5 12/24/2012   HGB 12.9 12/24/2012   HCT 36.6 12/24/2012   PLT 180 12/24/2012   GLUCOSE 92 12/24/2012   ALKPHOS 82 12/24/2012   ALT 15 12/24/2012   AST 22 12/24/2012   NA 139 12/24/2012   K 4.4 12/24/2012   CL 102 12/24/2012   CREATININE 1.1 12/24/2012   BUN 16.5 12/24/2012   CO2 28 12/24/2012     ASSESSMENT AND PLAN:   1.  History of iron deficiency anemia:  Resolved at this time.  Her Hgb is normal as is her ferritin.  She had negative GI work up when she was in [place].  She thinks that she is due for follow up colonoscopy this year.  I referred her to [facility] within one month.  She did not have response to oral iron.  Therefore, she will continue to be on observation with CBC and ferritin here at the [facility] every 4 months and IV iron if needed.   2.  Age-appropriate cancer screening:  Her last screening mammogram was about 2 years ago.  [facility]   3. Chronic kidney disease stage I: (new compared to before). Most likely due to HTN. Myeloma work up in 2013 were negative.   4. HTN: She is on Lasix, Metoprolol; Valsartan, Aldactone/HCTZ per PCP.   5. Hyperlipidemia: She is on Pravastatin per PCP.   6. History of CAD: She is on ASA, Metoprolol; Valsartan, Pravastatin  per PCP.   7. Interstitial lung disease: Most likely due to history of smoking in the past; combined with possible restrictive lung disease due to obesity. She follow up with Dr. Vassie Loll. She is on inhalers prn.   8.  Subcm lung nodules on CT chest on 03/2012. Follow up CT in 09/2012 were stable.  I defer to Dr. Vassie Loll to order more chest CT as he sees fit.   9.  Follow up:  In about 1 year.      The length of time of the face-to-face encounter was 25 minutes. More than 50% of time was spent counseling and coordination of care.

## 2012-12-24 NOTE — Patient Instructions (Addendum)
1.  History of iron deficiency anemia. 2.  Follow up:  Every 4 months lab test with CBC and ferritin level.  If Ferritin extremely low and also anemia, will repeat IV iron.  3.  Return visit in about 1 year.

## 2012-12-24 NOTE — Telephone Encounter (Signed)
Gave pt appt for labs and ML, schduled pt for mammogram and GI  appt with De. Schooler on 01/05/13 consultation  @ 2:15, gave referral tio HIM to fax to GI MD

## 2012-12-25 ENCOUNTER — Telehealth: Payer: Self-pay | Admitting: Oncology

## 2012-12-25 NOTE — Telephone Encounter (Signed)
Faxed pt medical records to Dr. Schooler °

## 2012-12-26 ENCOUNTER — Encounter: Payer: Self-pay | Admitting: Oncology

## 2012-12-31 ENCOUNTER — Telehealth: Payer: Self-pay | Admitting: Pulmonary Disease

## 2012-12-31 MED ORDER — MOMETASONE FUROATE 50 MCG/ACT NA SUSP: NASAL | Status: DC

## 2012-12-31 NOTE — Telephone Encounter (Signed)
i spoke with pt and is aware rx has been sent. Nothing further was needed 

## 2013-01-14 DIAGNOSIS — M19049 Primary osteoarthritis, unspecified hand: Secondary | ICD-10-CM | POA: Diagnosis not present

## 2013-01-14 DIAGNOSIS — M6530 Trigger finger, unspecified finger: Secondary | ICD-10-CM | POA: Diagnosis not present

## 2013-01-21 ENCOUNTER — Ambulatory Visit: Payer: Medicare Other

## 2013-02-05 ENCOUNTER — Ambulatory Visit
Admission: RE | Admit: 2013-02-05 | Discharge: 2013-02-05 | Disposition: A | Discharge status: Home / Self Care | Payer: Medicare Other | Source: Ambulatory Visit | Attending: Oncology | Admitting: Oncology

## 2013-02-05 DIAGNOSIS — Z1231 Encounter for screening mammogram for malignant neoplasm of breast: Secondary | ICD-10-CM | POA: Diagnosis not present

## 2013-02-11 DIAGNOSIS — K219 Gastro-esophageal reflux disease without esophagitis: Secondary | ICD-10-CM | POA: Diagnosis not present

## 2013-02-11 DIAGNOSIS — K648 Other hemorrhoids: Secondary | ICD-10-CM | POA: Diagnosis not present

## 2013-02-11 DIAGNOSIS — R12 Heartburn: Secondary | ICD-10-CM | POA: Diagnosis not present

## 2013-02-11 DIAGNOSIS — K5520 Angiodysplasia of colon without hemorrhage: Secondary | ICD-10-CM | POA: Diagnosis not present

## 2013-02-11 DIAGNOSIS — R198 Other specified symptoms and signs involving the digestive system and abdomen: Secondary | ICD-10-CM | POA: Diagnosis not present

## 2013-02-13 ENCOUNTER — Other Ambulatory Visit: Payer: Self-pay | Admitting: Oncology

## 2013-02-13 DIAGNOSIS — L821 Other seborrheic keratosis: Secondary | ICD-10-CM | POA: Diagnosis not present

## 2013-02-13 DIAGNOSIS — C44721 Squamous cell carcinoma of skin of unspecified lower limb, including hip: Secondary | ICD-10-CM | POA: Diagnosis not present

## 2013-02-13 DIAGNOSIS — L570 Actinic keratosis: Secondary | ICD-10-CM | POA: Diagnosis not present

## 2013-02-13 DIAGNOSIS — D0470 Carcinoma in situ of skin of unspecified lower limb, including hip: Secondary | ICD-10-CM | POA: Diagnosis not present

## 2013-03-20 DIAGNOSIS — D0470 Carcinoma in situ of skin of unspecified lower limb, including hip: Secondary | ICD-10-CM | POA: Diagnosis not present

## 2013-03-20 DIAGNOSIS — Z85828 Personal history of other malignant neoplasm of skin: Secondary | ICD-10-CM | POA: Diagnosis not present

## 2013-04-13 DIAGNOSIS — I2510 Atherosclerotic heart disease of native coronary artery without angina pectoris: Secondary | ICD-10-CM | POA: Diagnosis not present

## 2013-04-13 DIAGNOSIS — D638 Anemia in other chronic diseases classified elsewhere: Secondary | ICD-10-CM | POA: Diagnosis not present

## 2013-04-13 DIAGNOSIS — I10 Essential (primary) hypertension: Secondary | ICD-10-CM | POA: Diagnosis not present

## 2013-04-13 DIAGNOSIS — K219 Gastro-esophageal reflux disease without esophagitis: Secondary | ICD-10-CM | POA: Diagnosis not present

## 2013-04-13 DIAGNOSIS — M67919 Unspecified disorder of synovium and tendon, unspecified shoulder: Secondary | ICD-10-CM | POA: Diagnosis not present

## 2013-04-13 DIAGNOSIS — Z23 Encounter for immunization: Secondary | ICD-10-CM | POA: Diagnosis not present

## 2013-04-13 DIAGNOSIS — J479 Bronchiectasis, uncomplicated: Secondary | ICD-10-CM | POA: Diagnosis not present

## 2013-04-13 DIAGNOSIS — E785 Hyperlipidemia, unspecified: Secondary | ICD-10-CM | POA: Diagnosis not present

## 2013-04-13 DIAGNOSIS — E039 Hypothyroidism, unspecified: Secondary | ICD-10-CM | POA: Diagnosis not present

## 2013-04-14 ENCOUNTER — Telehealth: Payer: Self-pay | Admitting: Oncology

## 2013-04-15 ENCOUNTER — Other Ambulatory Visit: Payer: Self-pay | Admitting: Family Medicine

## 2013-04-15 DIAGNOSIS — E2839 Other primary ovarian failure: Secondary | ICD-10-CM

## 2013-04-23 ENCOUNTER — Other Ambulatory Visit: Payer: Medicare Other

## 2013-04-30 ENCOUNTER — Ambulatory Visit
Admission: RE | Admit: 2013-04-30 | Discharge: 2013-04-30 | Disposition: A | Discharge status: Home / Self Care | Payer: Medicare Other | Source: Ambulatory Visit | Attending: Family Medicine | Admitting: Family Medicine

## 2013-04-30 DIAGNOSIS — E2839 Other primary ovarian failure: Secondary | ICD-10-CM

## 2013-05-21 ENCOUNTER — Ambulatory Visit (INDEPENDENT_AMBULATORY_CARE_PROVIDER_SITE_OTHER): Payer: Medicare Other | Admitting: Adult Health

## 2013-05-21 ENCOUNTER — Encounter: Payer: Self-pay | Admitting: Adult Health

## 2013-05-21 VITALS — BP 104/60 | HR 62 | Temp 97.1°F | Ht 61.5 in | Wt 196.8 lb

## 2013-05-21 DIAGNOSIS — R911 Solitary pulmonary nodule: Secondary | ICD-10-CM

## 2013-05-21 DIAGNOSIS — J849 Interstitial pulmonary disease, unspecified: Secondary | ICD-10-CM

## 2013-05-21 DIAGNOSIS — R05 Cough: Secondary | ICD-10-CM | POA: Diagnosis not present

## 2013-05-21 DIAGNOSIS — J8410 Pulmonary fibrosis, unspecified: Secondary | ICD-10-CM | POA: Diagnosis not present

## 2013-05-21 NOTE — Assessment & Plan Note (Signed)
followup CT scan in September of this year. Follow with Dr. Vassie Loll in 6 months and as needed

## 2013-05-21 NOTE — Addendum Note (Signed)
Addended by: Boone Master E on: 05/21/2013 05:34 PM   Modules accepted: Orders

## 2013-05-21 NOTE — Patient Instructions (Addendum)
Begin Zyrtec 10mg  daily in am for nasal drip.  Begin Chlortrimeton 4mg  1 At bedtime  As needed  For drainage.  Add Pepcid 20mg  At bedtime   Restart Nexium Twice daily  (may use otc brand until drug assistance comes in)  follow up for CT chest in September as recommended.  follow up Dr. Vassie Loll  In 6 months and As needed

## 2013-05-21 NOTE — Progress Notes (Signed)
Subjective:    Patient ID: Cindy [name], female    DOB: July 17, 1932, 79 y.o.   MRN: [id number]  HPI  PCP [facility]  Cards- [name]   79 year old remote ex-smoker for FU of persistent wheezing and cough.  pt c/o Wheezing and cough w/ occasional yellow-cream color phlem, sob w/ activity occasionally x 2 1/2 years. cough worse late afternoon and at night, wheezing last about all day.  Had a CABG in 83 and takes occasional sublingual nitroglycerin at night but denies chest pain, orthopnea or paroxysmal nocturnal dyspnea. She is on Lasix for pedal edema . Tessalon Perles have not helped. Tussionex caused her to be drowsy in 911 had to be called. Chest x-ray from 03/11/2012 is reported normal. From 1/13 shows minimal scarring and prominent interstitium.  She was recently given a Z-Pak. She's been using Advair for a week with some improvement.  She uses Zyrtec for allergies and denies postnasal drip.  She uses Nexium twice a day and denies obvious heartburn or belching.  Meds reviewed shows a regimen suggestive of heart failure, no ACE.  She smoked 1.5 packs per day for 25 years before quitting in 83 at the time of her bypass.  Denies occupational or environmental exposure to mold or dust. She has not been formally allergy tested. There is no childhood history of asthma   05/06/2012  cough is 75% better from last visit, still has some wheezing. breathing has been pretty good  HRCT Appearance of the lung parenchyma is compatible with an underlying interstitial lung disease. the pattern is nonspecific, and could represent either early usual interstitial pneumonia (UIP), or nonspecific interstitial pneumonia (NSIP). Atherosclerosis, 8mm RLL nodule  PFTs no airway obstruction, preserved lung volumes, isolated decrease in DLCO to 56% corrects for Va.   11/21/12  CT 12/13 Similar bilateral pulmonary nodules. No new or enlarging nodules  cough is better right now. c/o "terrible" wheezing per spouse, runny  nose "like a faucet", occasional PND( some days worse than others)..  No increase SOB >>rec CT 06/2013 ,  Trial of nasonex.   05/21/2013 Follow up pulmonary nodules/ILD / Returns for follow up says she is doing okay except still can herself wheeze at night. Husband says he can wheezing when she lies down. Does have drippy nose.  Is currently in gap coverage for meds -very expensive. Has applied to drug assistance program.  Has bad reflux to point in throat , Is on nexium Twice daily  But off for a while due to expense.  Spirometry today shows preserved lung function w/ no airflow obstruction. Last CT 09/2012  Showed stable nodules.  No hemoptyiss, wt loss, fever  Or edema.     Past Surgical History  Procedure Laterality Date  . Coronary artery bypass graft  09/1979  . Coronary angioplasty with stent placement    . Moh's surgeries      for squamous cell carcinoma  . Partial hysterectomy  10/1978  . Appendectomy       Review of Systems  neg for any significant sore throat, dysphagia, itching, sneezing, nasal congestion or excess/ purulent secretions, fever, chills, sweats, unintended wt loss, pleuritic or exertional cp, hempoptysis, orthopnea pnd or change in chronic leg swelling. Also denies presyncope, palpitations, heartburn, abdominal pain, nausea, vomiting, diarrhea or change in bowel or urinary habits, dysuria,hematuria, rash, arthralgias, visual complaints, headache, numbness weakness or ataxia.     Objective:   Physical Exam   Gen. Pleasant, obese, in no distress ENT - no lesions,  no post nasal drip Neck: No JVD, no thyromegaly, no carotid bruits Lungs: no use of accessory muscles, no dullness to percussion, decreased without rales or rhonchi  Cardiovascular: Rhythm regular, heart sounds  normal, no murmurs or gallops, no peripheral edema Musculoskeletal: No deformities, no cyanosis or clubbing , no tremors       Assessment & Plan:

## 2013-05-21 NOTE — Assessment & Plan Note (Signed)
Spirometry today shows preserved lung function with a normal. FEV1 and ratio. Patient has an upcoming. CT scheduled for September 2014. Patient has had intermittent wheezing, but sounds more like upper airway irritation. Have recommended that she treats her triggers as postnasal drip and for reflux prevention.  Plan Begin Zyrtec 10mg  daily in am for nasal drip.  Begin Chlortrimeton 4mg  1 At bedtime  As needed  For drainage.  Add Pepcid 20mg  At bedtime   Restart Nexium Twice daily  (may use otc brand until drug assistance comes in)  follow up for CT chest in September as recommended.  follow up Dr. Vassie Loll  In 6 months and As needed

## 2013-06-04 DIAGNOSIS — M779 Enthesopathy, unspecified: Secondary | ICD-10-CM | POA: Diagnosis not present

## 2013-06-04 DIAGNOSIS — M25579 Pain in unspecified ankle and joints of unspecified foot: Secondary | ICD-10-CM | POA: Diagnosis not present

## 2013-06-04 DIAGNOSIS — G5760 Lesion of plantar nerve, unspecified lower limb: Secondary | ICD-10-CM | POA: Diagnosis not present

## 2013-06-04 DIAGNOSIS — M79609 Pain in unspecified limb: Secondary | ICD-10-CM | POA: Diagnosis not present

## 2013-06-08 DIAGNOSIS — E669 Obesity, unspecified: Secondary | ICD-10-CM | POA: Diagnosis not present

## 2013-06-08 DIAGNOSIS — I2510 Atherosclerotic heart disease of native coronary artery without angina pectoris: Secondary | ICD-10-CM | POA: Diagnosis not present

## 2013-06-08 DIAGNOSIS — I10 Essential (primary) hypertension: Secondary | ICD-10-CM | POA: Diagnosis not present

## 2013-06-08 DIAGNOSIS — E785 Hyperlipidemia, unspecified: Secondary | ICD-10-CM | POA: Diagnosis not present

## 2013-06-11 DIAGNOSIS — G5760 Lesion of plantar nerve, unspecified lower limb: Secondary | ICD-10-CM | POA: Diagnosis not present

## 2013-06-19 DIAGNOSIS — M65979 Unspecified synovitis and tenosynovitis, unspecified ankle and foot: Secondary | ICD-10-CM | POA: Diagnosis not present

## 2013-06-19 DIAGNOSIS — G5760 Lesion of plantar nerve, unspecified lower limb: Secondary | ICD-10-CM | POA: Diagnosis not present

## 2013-06-19 DIAGNOSIS — M659 Synovitis and tenosynovitis, unspecified: Secondary | ICD-10-CM | POA: Diagnosis not present

## 2013-06-19 DIAGNOSIS — M779 Enthesopathy, unspecified: Secondary | ICD-10-CM | POA: Diagnosis not present

## 2013-06-26 ENCOUNTER — Inpatient Hospital Stay: Admission: RE | Admit: 2013-06-26 | Payer: Medicare Other | Source: Ambulatory Visit

## 2013-06-30 ENCOUNTER — Other Ambulatory Visit: Payer: Medicare Other

## 2013-07-03 ENCOUNTER — Other Ambulatory Visit: Payer: Self-pay | Admitting: Pulmonary Disease

## 2013-07-03 ENCOUNTER — Ambulatory Visit (INDEPENDENT_AMBULATORY_CARE_PROVIDER_SITE_OTHER)
Admission: RE | Admit: 2013-07-03 | Discharge: 2013-07-03 | Disposition: A | Discharge status: Home / Self Care | Payer: Medicare Other | Source: Ambulatory Visit | Attending: Pulmonary Disease | Admitting: Pulmonary Disease

## 2013-07-03 DIAGNOSIS — R911 Solitary pulmonary nodule: Secondary | ICD-10-CM

## 2013-07-03 DIAGNOSIS — J984 Other disorders of lung: Secondary | ICD-10-CM | POA: Diagnosis not present

## 2013-07-03 DIAGNOSIS — J849 Interstitial pulmonary disease, unspecified: Secondary | ICD-10-CM

## 2013-08-25 ENCOUNTER — Telehealth: Payer: Self-pay | Admitting: Oncology

## 2013-08-25 NOTE — Telephone Encounter (Signed)
s.w. pt and r/s lab to the 1st of the year per pt request

## 2013-08-26 ENCOUNTER — Other Ambulatory Visit: Payer: Medicare Other

## 2013-08-26 DIAGNOSIS — M2040 Other hammer toe(s) (acquired), unspecified foot: Secondary | ICD-10-CM | POA: Diagnosis not present

## 2013-08-26 DIAGNOSIS — M779 Enthesopathy, unspecified: Secondary | ICD-10-CM | POA: Diagnosis not present

## 2013-08-26 DIAGNOSIS — G5760 Lesion of plantar nerve, unspecified lower limb: Secondary | ICD-10-CM | POA: Diagnosis not present

## 2013-09-02 ENCOUNTER — Encounter: Payer: Self-pay | Admitting: Interventional Cardiology

## 2013-09-02 DIAGNOSIS — Z951 Presence of aortocoronary bypass graft: Secondary | ICD-10-CM | POA: Diagnosis not present

## 2013-09-02 DIAGNOSIS — Z5320 Procedure and treatment not carried out because of patient's decision for unspecified reasons: Secondary | ICD-10-CM | POA: Diagnosis not present

## 2013-09-02 DIAGNOSIS — R079 Chest pain, unspecified: Secondary | ICD-10-CM | POA: Diagnosis not present

## 2013-09-02 DIAGNOSIS — Z9861 Coronary angioplasty status: Secondary | ICD-10-CM | POA: Diagnosis not present

## 2013-09-06 DIAGNOSIS — H00019 Hordeolum externum unspecified eye, unspecified eyelid: Secondary | ICD-10-CM | POA: Diagnosis not present

## 2013-09-07 DIAGNOSIS — H00019 Hordeolum externum unspecified eye, unspecified eyelid: Secondary | ICD-10-CM | POA: Diagnosis not present

## 2013-09-14 DIAGNOSIS — H04329 Acute dacryocystitis of unspecified lacrimal passage: Secondary | ICD-10-CM | POA: Diagnosis not present

## 2013-09-28 DIAGNOSIS — H04329 Acute dacryocystitis of unspecified lacrimal passage: Secondary | ICD-10-CM | POA: Diagnosis not present

## 2013-10-07 DIAGNOSIS — D638 Anemia in other chronic diseases classified elsewhere: Secondary | ICD-10-CM | POA: Diagnosis not present

## 2013-10-07 DIAGNOSIS — J8410 Pulmonary fibrosis, unspecified: Secondary | ICD-10-CM | POA: Diagnosis not present

## 2013-10-07 DIAGNOSIS — Z23 Encounter for immunization: Secondary | ICD-10-CM | POA: Diagnosis not present

## 2013-10-07 DIAGNOSIS — E039 Hypothyroidism, unspecified: Secondary | ICD-10-CM | POA: Diagnosis not present

## 2013-10-07 DIAGNOSIS — E785 Hyperlipidemia, unspecified: Secondary | ICD-10-CM | POA: Diagnosis not present

## 2013-10-07 DIAGNOSIS — I10 Essential (primary) hypertension: Secondary | ICD-10-CM | POA: Diagnosis not present

## 2013-10-23 ENCOUNTER — Telehealth: Payer: Self-pay | Admitting: Hematology and Oncology

## 2013-10-23 ENCOUNTER — Other Ambulatory Visit: Payer: Self-pay | Admitting: Hematology and Oncology

## 2013-10-23 NOTE — Telephone Encounter (Signed)
I sw pt at home number and adv per Dr Alvy Bimler labs good and no need to return shh

## 2013-10-23 NOTE — Telephone Encounter (Signed)
Dr Alvy Bimler handed me a note requesting I call pt and tell her no lab needed in future that her last labs werer god and there was no need for pt to come back shh

## 2013-10-26 ENCOUNTER — Other Ambulatory Visit: Payer: Medicare Other

## 2013-11-24 ENCOUNTER — Telehealth: Payer: Self-pay | Admitting: *Deleted

## 2013-11-24 MED ORDER — LOSARTAN POTASSIUM 100 MG PO TABS: 100.0000 mg | ORAL_TABLET | Freq: Every day | ORAL | Status: DC

## 2013-11-24 NOTE — Telephone Encounter (Signed)
Reviewed by Sally Putt Earl, Pharm D 

## 2013-12-02 ENCOUNTER — Telehealth: Payer: Self-pay | Admitting: Cardiology

## 2013-12-02 ENCOUNTER — Encounter: Payer: Self-pay | Admitting: *Deleted

## 2013-12-02 NOTE — Telephone Encounter (Signed)
New message    Husband picked up medications today and one of the medications was lorsartan.  She has never taken this medication before.  Did Dr Marlou Porch want her to take this medication and if yes, why?

## 2013-12-09 ENCOUNTER — Ambulatory Visit: Payer: Medicare Other | Admitting: Cardiology

## 2013-12-10 ENCOUNTER — Other Ambulatory Visit: Payer: Self-pay

## 2013-12-10 ENCOUNTER — Other Ambulatory Visit: Payer: Self-pay | Admitting: *Deleted

## 2013-12-10 MED ORDER — ISOSORBIDE MONONITRATE ER 60 MG PO TB24
60.0000 mg | ORAL_TABLET | Freq: Every day | ORAL | Status: DC
Start: 2013-12-10 — End: 2013-12-10

## 2013-12-10 MED ORDER — ISOSORBIDE MONONITRATE ER 60 MG PO TB24
60.0000 mg | ORAL_TABLET | Freq: Every day | ORAL | Status: DC
Start: 2013-12-10 — End: 2018-11-28

## 2013-12-10 NOTE — Telephone Encounter (Signed)
Spoke with patient and explained the medication change reviewed with Porfirio Oar, Pharm D, patient has a formulary change as noted 11/24/2013, changed diovan to losartan. She states understands that. Patient for refill on imdur.

## 2013-12-10 NOTE — Telephone Encounter (Signed)
I took care of this by talking with patient.

## 2013-12-11 ENCOUNTER — Ambulatory Visit (INDEPENDENT_AMBULATORY_CARE_PROVIDER_SITE_OTHER): Payer: Medicare Other | Admitting: Cardiology

## 2013-12-11 ENCOUNTER — Encounter: Payer: Self-pay | Admitting: Cardiology

## 2013-12-11 VITALS — BP 112/70 | HR 68 | Ht 61.5 in | Wt 199.0 lb

## 2013-12-11 DIAGNOSIS — I2510 Atherosclerotic heart disease of native coronary artery without angina pectoris: Secondary | ICD-10-CM | POA: Insufficient documentation

## 2013-12-11 DIAGNOSIS — E785 Hyperlipidemia, unspecified: Secondary | ICD-10-CM | POA: Insufficient documentation

## 2013-12-11 DIAGNOSIS — J849 Interstitial pulmonary disease, unspecified: Secondary | ICD-10-CM | POA: Insufficient documentation

## 2013-12-11 DIAGNOSIS — I208 Other forms of angina pectoris: Secondary | ICD-10-CM | POA: Diagnosis not present

## 2013-12-11 DIAGNOSIS — I252 Old myocardial infarction: Secondary | ICD-10-CM | POA: Diagnosis not present

## 2013-12-11 DIAGNOSIS — J8410 Pulmonary fibrosis, unspecified: Secondary | ICD-10-CM

## 2013-12-11 DIAGNOSIS — R0989 Other specified symptoms and signs involving the circulatory and respiratory systems: Secondary | ICD-10-CM

## 2013-12-11 DIAGNOSIS — Z87891 Personal history of nicotine dependence: Secondary | ICD-10-CM | POA: Diagnosis not present

## 2013-12-11 DIAGNOSIS — K219 Gastro-esophageal reflux disease without esophagitis: Secondary | ICD-10-CM

## 2013-12-11 NOTE — Patient Instructions (Signed)
Your physician recommends that you continue on your current medications as directed. Please refer to the Current Medication list given to you today.  Your physician has requested that you have a lexiscan myoview. For further information please visit [email or web address]. Please follow instruction sheet, as given.  Your physician has requested that you have a carotid duplex. This test is an ultrasound of the carotid arteries in your neck. It looks at blood flow through these arteries that supply the brain with blood. Allow one hour for this exam. There are no restrictions or special instructions.  Your physician wants you to follow-up in: 6 months with Dr. Marlou Porch. You will receive a reminder letter in the mail two months in advance. If you don't receive a letter, please call our office to schedule the follow-up appointment.

## 2013-12-11 NOTE — Progress Notes (Signed)
[facility] [id number], [address],   [address] Phone: [phone] Fax:  [phone]  Date:  12/11/2013   ID:  Cindy [name], DOB [birth year], MRN [id number]  PCP:  Milagros Evener, MD   History of Present Illness: Cindy [name] is a 80 y.o. female with coronary artery disease status post myocardial infarction at age 19, hypertension, hyperlipidemia, nonspecific interstitial lung disease, with bypass surgery in [place] in 1981 here for six-month followup. 2 previous stents were placed in 2005, kissing stents, one in 2009 and a PTCA in 2010. Had episode of jaw pain and back pain in [place]. Took NTG. No change. Went to ER. EKG and blood work normal. Did not want to stay overnight. Been OK since. At night will feel mild discomfort in chest. She has to sit up at times needing to sit up to eat. NTG can help.   Last stress test approximately 2008. She states that she has "been induced for her stress test ". Pharmacologic.   Wt Readings from Last 3 Encounters:  12/11/13 199 lb (90.266 kg)  05/21/13 196 lb 12.8 oz (89.268 kg)  12/24/12 197 lb 6.4 oz (89.54 kg)     Past Medical History  Diagnosis Date  . Bronchitis   . Hypertension   . Thyroid disease   . Coronary artery disease     MI age 40  . Hypercholesteremia   . GERD (gastroesophageal reflux disease)   . COPD (chronic obstructive pulmonary disease)   . Iron deficiency anemia     used to see hem in [place], [place].   [name] Renal insufficiency   . Asthma   . Heart attack   . Skin cancer     Past Surgical History  Procedure Laterality Date  . Coronary artery bypass graft  09/1979  . Coronary angioplasty with stent placement    . Moh's surgeries      for squamous cell carcinoma  . Partial hysterectomy  10/1978  . Appendectomy      Current Outpatient Prescriptions  Medication Sig Dispense Refill  . amitriptyline (ELAVIL) 25 MG tablet Take 25 mg by mouth at bedtime.       [name] aspirin 81 MG tablet Take 81  mg by mouth daily.       . benzonatate (TESSALON) 100 MG capsule Take 1 capsule (100 mg total) by mouth at bedtime.  20 capsule  2  . calcium-vitamin D (OSCAL WITH D) 500-200 MG-UNIT per tablet Take 1 tablet by mouth daily.      [name] esomeprazole (NEXIUM) 40 MG capsule Take 40 mg by mouth 2 (two) times daily.        . furosemide (LASIX) 40 MG tablet Take 40 mg by mouth daily. For fluid       . Ipratropium-Albuterol (COMBIVENT RESPIMAT) 20-100 MCG/ACT AERS respimat Inhale 1 puff into the lungs 4 (four) times daily as needed.  1 Inhaler  5  . isosorbide mononitrate (IMDUR) 60 MG 24 hr tablet Take 1 tablet (60 mg total) by mouth daily.  90 tablet  1  . levothyroxine (SYNTHROID, LEVOTHROID) 150 MCG tablet Take 150 mcg by mouth daily.        [name] losartan (COZAAR) 100 MG tablet Take 1 tablet (100 mg total) by mouth daily.  30 tablet  3  . metoprolol (TOPROL-XL) 50 MG 24 hr tablet Take 50 mg by mouth daily.        . mometasone (NASONEX) 50 MCG/ACT nasal spray  1 spray each nostril twice a day  52 g  3  . Polyethyl Glycol-Propyl Glycol (SYSTANE) 0.4-0.3 % SOLN Apply 1 drop to eye daily.        . pravastatin (PRAVACHOL) 40 MG tablet Take 80 mg by mouth daily.       [name] spironolactone-hydrochlorothiazide (ALDACTAZIDE) 25-25 MG per tablet Take 1 tablet by mouth daily.        . traMADol (ULTRAM) 50 MG tablet Take 50 mg by mouth 2 (two) times daily as needed. For pain. Maximum dose= 8 tablets per day       No current facility-administered medications for this visit.    Allergies:    Allergies  Allergen Reactions  . Codeine Other (See Comments)    Headache from heavy doses of codeine only    Social History:  The patient  reports that she quit smoking about 34 years ago. Her smoking use included Cigarettes. She has a 33 pack-year smoking history. She has never used smokeless tobacco. She reports that she drinks alcohol. She reports that she does not use illicit drugs.   ROS:  Please see the history of present  illness.   Denies any fevers, chills, orthopnea, PND.    PHYSICAL EXAM: VS:  BP 112/70  Pulse 68  Ht 5' 1.5" (1.562 m)  Wt 199 lb (90.266 kg)  BMI 37.00 kg/m2 Well nourished, well developed, in no acute distress HEENT: normal Neck: no JVDLeft sided bruit.  Cardiac:  normal S1, S2; RRR; soft systolic murmur Lungs:  clear to auscultation bilaterally, no wheezing, rhonchi or rales Abd: soft, nontender, no hepatomegalyOverweight Ext: no edema Skin: warm and dry Neuro: no focal abnormalities noted  EKG:  Sinus rhythm, first degree AV block, nonspecific intraventricular conduction delay, QRS duration 146 ms. Nonspecific ST-T wave changes.  LABS: 10/07/13- LDL 142, hemoglobin 12.5, creatinine 1.1  ASSESSMENT AND PLAN:  1. Coronary artery disease-status post bypass surgery in 1981. Subsequent stent placement.  2. Old myocardial infarction-age 71. Continue with aggressive secondary prevention. 3. History of tobacco use-quit smoking several years ago. 4. Angina-previously under good control. Currently on both metoprolol as well as isosorbide. However now is experiencing some central chest discomfort which may be GERD related or possible angina given her prior coronary artery disease. We will check a pharmacologic stress test. 5. Hyperlipidemia-LDL goal is 70. Currently on pravastatin 80 from 40. In the past, she took herself off of Lipitor thinking that this was contributing to inner thigh cramping. She is now not completely convinced that the Lipitor was causing her cramping. She still has some hand cramps. She does not take WelChol anymore because of cost. Dr. Zadie Rhine is repeating the profile seen. If LDL is not at goal, 70 which I do not believe that pravastatin we'll get HER-2, strongly encourage use of either atorvastatin or perhaps Crestor if necessary. 6. Obesity-encourage weight loss 7. GERD-continue with Nexium. Could be her symptoms at night. 8. First degree AVB - stable.  9. COPD-per  primary team.  Signed, Candee Furbish, MD [facility]  12/11/2013 10:20 AM

## 2013-12-21 ENCOUNTER — Ambulatory Visit (HOSPITAL_COMMUNITY): Payer: Medicare Other | Attending: Cardiology

## 2013-12-21 ENCOUNTER — Other Ambulatory Visit (HOSPITAL_COMMUNITY): Payer: Self-pay | Admitting: Cardiology

## 2013-12-21 DIAGNOSIS — I6529 Occlusion and stenosis of unspecified carotid artery: Secondary | ICD-10-CM

## 2013-12-21 DIAGNOSIS — R0989 Other specified symptoms and signs involving the circulatory and respiratory systems: Secondary | ICD-10-CM | POA: Diagnosis not present

## 2013-12-24 ENCOUNTER — Other Ambulatory Visit: Payer: Medicare Other

## 2013-12-24 ENCOUNTER — Ambulatory Visit: Payer: Medicare Other | Admitting: Oncology

## 2013-12-29 ENCOUNTER — Encounter (HOSPITAL_COMMUNITY): Payer: Medicare Other

## 2013-12-30 ENCOUNTER — Ambulatory Visit (HOSPITAL_COMMUNITY): Payer: Medicare Other | Attending: Cardiology | Admitting: Radiology

## 2013-12-30 VITALS — BP 102/54 | Ht 62.0 in | Wt 200.0 lb

## 2013-12-30 DIAGNOSIS — I2510 Atherosclerotic heart disease of native coronary artery without angina pectoris: Secondary | ICD-10-CM | POA: Diagnosis not present

## 2013-12-30 DIAGNOSIS — R079 Chest pain, unspecified: Secondary | ICD-10-CM | POA: Diagnosis not present

## 2013-12-30 DIAGNOSIS — I447 Left bundle-branch block, unspecified: Secondary | ICD-10-CM

## 2013-12-30 DIAGNOSIS — I2089 Other forms of angina pectoris: Secondary | ICD-10-CM | POA: Insufficient documentation

## 2013-12-30 DIAGNOSIS — I208 Other forms of angina pectoris: Secondary | ICD-10-CM | POA: Diagnosis not present

## 2013-12-30 MED ORDER — TECHNETIUM TC 99M SESTAMIBI GENERIC - CARDIOLITE
30.0000 | Freq: Once | INTRAVENOUS | Status: AC | PRN
  Administered 2013-12-30: 30 via INTRAVENOUS

## 2013-12-30 MED ORDER — TECHNETIUM TC 99M SESTAMIBI GENERIC - CARDIOLITE
10.0000 | Freq: Once | INTRAVENOUS | Status: AC | PRN
  Administered 2013-12-30: 10 via INTRAVENOUS

## 2013-12-30 MED ORDER — REGADENOSON 0.4 MG/5ML IV SOLN
0.4000 mg | Freq: Once | INTRAVENOUS | Status: AC
  Administered 2013-12-30: 0.4 mg via INTRAVENOUS

## 2013-12-30 NOTE — Progress Notes (Signed)
[facility] [address] [place], [place] [address] [phone]    Cardiology Nuclear Med Study  Cindy [name] is a 80 y.o. female     MRN : [id number]     DOB: 01-Jun-1932  Procedure Date: 12/30/2013  Nuclear Med Background Indication for Stress Test:  Evaluation for Ischemia, Graft Patency and Stent Patency History:  COPD and CAD-MI-Cath-CABG-STENT-PTCA, '08 [place] per pt interstitual lung Dz Cardiac Risk Factors: Carotid Disease, Family History - CAD, History of Smoking, Hypertension and Lipids  Symptoms:  Chest Pain   Nuclear Pre-Procedure Caffeine/Decaff Intake:  None > 12 hrs NPO After: 10:00pm   Lungs:  clear O2 Sat: 98% on room air. IV 0.9% NS with Angio Cath:  24g  IV Site: L Hand x 1, tolerated well IV Started by:  Irven Baltimore, RN  Chest Size (in):  40 Cup Size: C  Height: 5\' 2"  (1.575 m)  Weight:  200 lb (90.719 kg)  BMI:  Body mass index is 36.57 kg/(m^2). Tech Comments:  Took Toprol this am    Nuclear Med Study 1 or 2 day study: 1 day  Stress Test Type:  Lexiscan  Reading MD: N/A  Order Authorizing Provider:  Candee Furbish, MD  Resting Radionuclide: Technetium 51m Sestamibi  Resting Radionuclide Dose: 11.0 mCi   Stress Radionuclide:  Technetium 74m Sestamibi  Stress Radionuclide Dose: 33.0 mCi           Stress Protocol Rest HR: 58 Stress HR: 74  Rest BP: 102/54 Stress BP: 133/68  Exercise Time (min): n/a METS: n/a   Predicted Max HR: 141 bpm % Max HR: 52.48 bpm Rate Pressure Product: 9842   Dose of Adenosine (mg):  n/a Dose of Lexiscan: 0.4 mg  Dose of Atropine (mg): n/a Dose of Dobutamine: n/a mcg/kg/min (at max HR)  Stress Test Technologist: Perrin Maltese, EMT-P  Nuclear Technologist:  Charlton Amor, CNMT     Rest Procedure:  Myocardial perfusion imaging was performed at rest 45 minutes following the intravenous administration of Technetium 70m Sestamibi. Rest ECG: NSR-LBBB  Stress Procedure:  The patient  received IV Lexiscan 0.4 mg over 15-seconds.  Technetium 25m Sestamibi injected at 30-seconds. This patient had sob, felt weird, chest pressure/tightness, and had a headache with the Lexiscan injection. Quantitative spect images were obtained after a 45 minute delay. Stress ECG: No significant change from baseline ECG  QPS Raw Data Images:  There is interference from nuclear activity from structures below the diaphragm. This does not affect the ability to read the study. Stress Images:  There is moderately reduced tracer uptake in the inferior wall. There is mildly reduced uptake in the apex. Rest Images:  There is limited improvement at the periphery of the inferior wall defect. Subtraction (SDS):  There is a fixed defect that is most consistent with a previous infarction. There is mild peri-infarct ischemia Transient Ischemic Dilatation (Normal <1.22):  0.95 Lung/Heart Ratio (Normal <0.45):  0.37  Quantitative Gated Spect Images QGS EDV:  84 ml QGS ESV:  27 ml  Impression Exercise Capacity:  Lexiscan with no exercise. BP Response:  Normal blood pressure response. Clinical Symptoms:  No significant symptoms noted. ECG Impression:  No significant ECG changes with Lexiscan. Comparison with Prior Nuclear Study: No previous nuclear study performed  Overall Impression:  Low risk stress nuclear study with a moderate inferior wall scar and a mild apical scar. There is limited per-infarct ischemia..  LV Ejection Fraction: 68%.  LV  Wall Motion:  Mild inferior wall hypokinesis. Normal overall systolic function.  Sanda Klein, MD, [facility] [phone] office [phone] pager

## 2014-01-01 ENCOUNTER — Telehealth: Payer: Self-pay | Admitting: Cardiology

## 2014-01-01 NOTE — Telephone Encounter (Signed)
Follow up    Pt is going out of town Sunday 3/15 and would like to know the results of Stress test 3/11 if possible.  Please call cell if returning call on Mon 3/17.

## 2014-01-01 NOTE — Telephone Encounter (Signed)
Preliminary report of stress test was reviewed with DOD Dr Lovena Le, no new ischemia noted. Pt was informed she will get a call with final report once Dr Etter Sjogren reviewed/ pt agreed to plan.

## 2014-01-04 NOTE — Telephone Encounter (Signed)
Agree. Reassuring stress test. Low risk.

## 2014-01-05 NOTE — Telephone Encounter (Signed)
Spoke with patient advised that Echo was reassuring and low risk.

## 2014-01-11 ENCOUNTER — Ambulatory Visit (INDEPENDENT_AMBULATORY_CARE_PROVIDER_SITE_OTHER): Payer: Medicare Other | Admitting: Pulmonary Disease

## 2014-01-11 ENCOUNTER — Encounter: Payer: Self-pay | Admitting: Pulmonary Disease

## 2014-01-11 VITALS — BP 104/62 | HR 57 | Temp 97.7°F | Wt 200.6 lb

## 2014-01-11 DIAGNOSIS — R911 Solitary pulmonary nodule: Secondary | ICD-10-CM

## 2014-01-11 DIAGNOSIS — J849 Interstitial pulmonary disease, unspecified: Secondary | ICD-10-CM

## 2014-01-11 DIAGNOSIS — I208 Other forms of angina pectoris: Secondary | ICD-10-CM | POA: Diagnosis not present

## 2014-01-11 DIAGNOSIS — J8410 Pulmonary fibrosis, unspecified: Secondary | ICD-10-CM

## 2014-01-11 NOTE — Assessment & Plan Note (Signed)
1 yr FU CT chest prior to next visit

## 2014-01-11 NOTE — Assessment & Plan Note (Signed)
Appear stable by history. Can monitor FVC here. Continue antitussive therapy

## 2014-01-11 NOTE — Patient Instructions (Signed)
Call if breathing or cough worse CT chest prior to next visit

## 2014-01-11 NOTE — Progress Notes (Signed)
   Subjective:    Patient ID: Cindy [name], female    DOB: 07-06-1932, 80 y.o.   MRN: [id number]  HPI  PCP [facility]  Cards- Skains   80 year old remote ex-smoker for FU of ILD & stable RLL pulmonary nodule. She presented in 2013 with persistent wheezing and cough.  x 2 1/2 years.  Had a CABG in 74, Tessalon Perles have not helped. Tussionex caused her to be drowsy in 911 had to be called. Chest x-ray from 03/11/2012 is reported normal. From 10/2011 shows minimal scarring and prominent interstitium.  She smoked 1.5 packs per day for 25 years before quitting in '81 at the time of her bypass.   HRCT 03/2012 Appearance of the lung parenchyma is compatible with an underlying interstitial lung disease. the pattern is nonspecific, and could represent either early usual interstitial pneumonia (UIP), or nonspecific interstitial pneumonia (NSIP). 55mm RLL nodule  PFTs no airway obstruction, preserved lung volumes, isolated decrease in DLCO to 56% corrects for Va.    01/11/2014  Husband says he can hear wheezing when she lies down. Has bad reflux to point in throat , was on nexium Twice daily  Spirometry 04/2013 - preserved lung function w/ no airflow obstruction.  Cough improved - tessalon helps CT chest 06/2013 Unchanged nodules  & scarring Does not desatn on walking  Chief Complaint  Patient presents with  . Follow-up    Pt reports her breathing has been good. Has not had much coughing lately. C/o wheezing all the time. Occas chest tx.      Review of Systems neg for any significant sore throat, dysphagia, itching, sneezing, nasal congestion or excess/ purulent secretions, fever, chills, sweats, unintended wt loss, pleuritic or exertional cp, hempoptysis, orthopnea pnd or change in chronic leg swelling. Also denies presyncope, palpitations, heartburn, abdominal pain, nausea, vomiting, diarrhea or change in bowel or urinary habits, dysuria,hematuria, rash, arthralgias, visual complaints, headache,  numbness weakness or ataxia.     Objective:   Physical Exam  Gen. Pleasant, obese, in no distress ENT - no lesions, no post nasal drip Neck: No JVD, no thyromegaly, no carotid bruits Lungs: no use of accessory muscles, no dullness to percussion, decreased without rales or rhonchi  Cardiovascular: Rhythm regular, heart sounds  normal, no murmurs or gallops, no peripheral edema Musculoskeletal: No deformities, no cyanosis or clubbing , no tremors       Assessment & Plan:

## 2014-01-14 DIAGNOSIS — H43819 Vitreous degeneration, unspecified eye: Secondary | ICD-10-CM | POA: Diagnosis not present

## 2014-01-18 ENCOUNTER — Encounter (INDEPENDENT_AMBULATORY_CARE_PROVIDER_SITE_OTHER): Payer: Medicare Other | Admitting: Ophthalmology

## 2014-01-18 DIAGNOSIS — E039 Hypothyroidism, unspecified: Secondary | ICD-10-CM | POA: Diagnosis not present

## 2014-01-18 DIAGNOSIS — H4320 Crystalline deposits in vitreous body, unspecified eye: Secondary | ICD-10-CM

## 2014-01-18 DIAGNOSIS — J449 Chronic obstructive pulmonary disease, unspecified: Secondary | ICD-10-CM | POA: Diagnosis not present

## 2014-01-18 DIAGNOSIS — E785 Hyperlipidemia, unspecified: Secondary | ICD-10-CM | POA: Diagnosis not present

## 2014-01-18 DIAGNOSIS — H3530 Unspecified macular degeneration: Secondary | ICD-10-CM | POA: Diagnosis not present

## 2014-01-18 DIAGNOSIS — K429 Umbilical hernia without obstruction or gangrene: Secondary | ICD-10-CM | POA: Diagnosis not present

## 2014-01-18 DIAGNOSIS — D638 Anemia in other chronic diseases classified elsewhere: Secondary | ICD-10-CM | POA: Diagnosis not present

## 2014-01-18 DIAGNOSIS — H35039 Hypertensive retinopathy, unspecified eye: Secondary | ICD-10-CM

## 2014-01-18 DIAGNOSIS — I10 Essential (primary) hypertension: Secondary | ICD-10-CM | POA: Diagnosis not present

## 2014-01-18 DIAGNOSIS — H43819 Vitreous degeneration, unspecified eye: Secondary | ICD-10-CM

## 2014-01-19 ENCOUNTER — Other Ambulatory Visit: Payer: Self-pay | Admitting: Family Medicine

## 2014-01-19 ENCOUNTER — Telehealth: Payer: Self-pay | Admitting: Pulmonary Disease

## 2014-01-19 DIAGNOSIS — K429 Umbilical hernia without obstruction or gangrene: Secondary | ICD-10-CM

## 2014-01-19 NOTE — Telephone Encounter (Signed)
I spoke with the pt and she states her CT was scheduled for sept and I advised that per dr. Elsworth Soho last note this is correct. Nothing further needed. [place], CMA

## 2014-01-20 DIAGNOSIS — D638 Anemia in other chronic diseases classified elsewhere: Secondary | ICD-10-CM | POA: Diagnosis not present

## 2014-01-20 DIAGNOSIS — E039 Hypothyroidism, unspecified: Secondary | ICD-10-CM | POA: Diagnosis not present

## 2014-01-20 DIAGNOSIS — E785 Hyperlipidemia, unspecified: Secondary | ICD-10-CM | POA: Diagnosis not present

## 2014-01-20 DIAGNOSIS — I10 Essential (primary) hypertension: Secondary | ICD-10-CM | POA: Diagnosis not present

## 2014-01-20 DIAGNOSIS — K429 Umbilical hernia without obstruction or gangrene: Secondary | ICD-10-CM | POA: Diagnosis not present

## 2014-01-20 DIAGNOSIS — J449 Chronic obstructive pulmonary disease, unspecified: Secondary | ICD-10-CM | POA: Diagnosis not present

## 2014-01-22 ENCOUNTER — Other Ambulatory Visit: Payer: Self-pay | Admitting: Family Medicine

## 2014-01-22 ENCOUNTER — Ambulatory Visit
Admission: RE | Admit: 2014-01-22 | Discharge: 2014-01-22 | Disposition: A | Discharge status: Home / Self Care | Payer: Medicare Other | Source: Ambulatory Visit | Attending: Family Medicine | Admitting: Family Medicine

## 2014-01-22 DIAGNOSIS — R1909 Other intra-abdominal and pelvic swelling, mass and lump: Secondary | ICD-10-CM | POA: Diagnosis not present

## 2014-01-22 DIAGNOSIS — K429 Umbilical hernia without obstruction or gangrene: Secondary | ICD-10-CM

## 2014-02-04 ENCOUNTER — Other Ambulatory Visit (INDEPENDENT_AMBULATORY_CARE_PROVIDER_SITE_OTHER): Payer: Self-pay

## 2014-02-04 ENCOUNTER — Encounter (INDEPENDENT_AMBULATORY_CARE_PROVIDER_SITE_OTHER): Payer: Self-pay | Admitting: General Surgery

## 2014-02-04 ENCOUNTER — Ambulatory Visit (INDEPENDENT_AMBULATORY_CARE_PROVIDER_SITE_OTHER): Payer: Medicare Other | Admitting: General Surgery

## 2014-02-04 VITALS — BP 130/75 | HR 61 | Temp 97.6°F | Resp 16 | Ht 61.5 in | Wt 205.4 lb

## 2014-02-04 DIAGNOSIS — K429 Umbilical hernia without obstruction or gangrene: Secondary | ICD-10-CM | POA: Diagnosis not present

## 2014-02-04 DIAGNOSIS — N6452 Nipple discharge: Secondary | ICD-10-CM

## 2014-02-04 MED ORDER — HYDROCORTISONE ACETATE 25 MG RE SUPP: 25.0000 mg | Freq: Two times a day (BID) | RECTAL | Status: DC

## 2014-02-04 NOTE — Progress Notes (Signed)
Patient ID: Cindy [name], female   DOB: Mar 09, 1932, 80 y.o.   MRN: [id number]  Chief Complaint  Patient presents with  . Umbilical Hernia    HPI Cindy [name] is a 80 y.o. female.   HPI  She is referred by Dr. Dahlia Bailiff for further evaluation and treatment of an umbilical hernia. She has had occasional pain and swelling from the site. Ultrasound was performed which demonstrated fatty tissue but no intestine. She doesn't have to strain to urinate or to have a bowel movement.  Past Medical History  Diagnosis Date  . Bronchitis   . Hypertension   . Thyroid disease   . Coronary artery disease     MI age 3  . Hypercholesteremia   . GERD (gastroesophageal reflux disease)   . COPD (chronic obstructive pulmonary disease)   . Iron deficiency anemia     used to see hem in [place], [place].   [name] Renal insufficiency   . Asthma   . Heart attack   . Skin cancer     Past Surgical History  Procedure Laterality Date  . Coronary artery bypass graft  09/1979  . Coronary angioplasty with stent placement    . Moh's surgeries      for squamous cell carcinoma  . Partial hysterectomy  10/1978  . Appendectomy      Family History  Problem Relation Age of Onset  . Melanoma Sister     x2  . Heart disease Father   . Heart disease Mother     Social History History  Substance Use Topics  . Smoking status: Former Smoker -- 1.50 packs/day for 22 years    Types: Cigarettes    Quit date: 10/23/1979  . Smokeless tobacco: Never Used  . Alcohol Use: Yes     Comment: occasional    Allergies  Allergen Reactions  . Codeine Other (See Comments)    Headache from heavy doses of codeine only    Current Outpatient Prescriptions  Medication Sig Dispense Refill  . amitriptyline (ELAVIL) 25 MG tablet Take 25 mg by mouth at bedtime.       [name] aspirin 81 MG tablet Take 81 mg by mouth daily.       . calcium-vitamin D (OSCAL WITH D) 500-200 MG-UNIT per tablet Take 1 tablet by mouth daily.      [name] esomeprazole  (NEXIUM) 40 MG capsule Take 40 mg by mouth 2 (two) times daily.        . furosemide (LASIX) 40 MG tablet Take 40 mg by mouth daily. For fluid       . Ipratropium-Albuterol (COMBIVENT RESPIMAT) 20-100 MCG/ACT AERS respimat Inhale 1 puff into the lungs 4 (four) times daily as needed.  1 Inhaler  5  . isosorbide mononitrate (IMDUR) 60 MG 24 hr tablet Take 1 tablet (60 mg total) by mouth daily.  90 tablet  1  . levothyroxine (SYNTHROID, LEVOTHROID) 150 MCG tablet Take 150 mcg by mouth daily.        . metoprolol (TOPROL-XL) 50 MG 24 hr tablet Take 50 mg by mouth daily.        . mometasone (NASONEX) 50 MCG/ACT nasal spray As needed      . pravastatin (PRAVACHOL) 40 MG tablet Take 80 mg by mouth daily.       [name] spironolactone-hydrochlorothiazide (ALDACTAZIDE) 25-25 MG per tablet Take 1 tablet by mouth daily.        . traMADol (ULTRAM) 50 MG tablet Take 50 mg by mouth  2 (two) times daily as needed. For pain. Maximum dose= 8 tablets per day      . hydrocortisone (ANUSOL-HC) 25 MG suppository Place 1 suppository (25 mg total) rectally 2 (two) times daily.  12 suppository  0   No current facility-administered medications for this visit.    Review of Systems Review of Systems  Constitutional: Negative.   HENT: Positive for congestion.   Respiratory: Positive for wheezing.   Cardiovascular: Positive for leg swelling.  Gastrointestinal:       Small anal mass that she noted yesterday.  Musculoskeletal: Positive for arthralgias.  Hematological: Bruises/bleeds easily.    Blood pressure 130/75, pulse 61, temperature 97.6 F (36.4 C), resp. rate 16, height 5' 1.5" (1.562 m), weight 205 lb 6.4 oz (93.169 kg).  Physical Exam Physical Exam  Constitutional:  Obese elderly female. She is here with her husband.  Cardiovascular: Normal rate and regular rhythm.   Abdominal: Soft.  There is a tender reducible umbilical bulge. There is a small fascial defect.  Genitourinary:  No palpable inguinal  hernias.  Anorectal-reducible, prolapsed right anterior hemorrhoid.    Data Reviewed Note from Dr. Radene Ou' office.  Assessment    1. Umbilical hernia that is reducible and intermittently symptomatic. Hernia contains fat.  2. Prolapsing right anterior internal hemorrhoid.     Plan    We discussed repairing the umbilical hernia versus expected management. I. Recommendation to her was that it was uncomfortable and interfering with her lifestyle that she have it repaired. We discussed the procedure and risks.  Risks include but are not limited to bleeding, infection, wound problems, anesthesia, recurrence, injury to intestine, mesh problems.   She is not sure she wants to have it done at this time. She will call back if she is interested in surgery. We talked about the small chance of incarceration.  As for her prolapsing internal hemorrhoid, I recommended Anusol suppositories and will prescribe these for her.       Rhunette Croft Alajah Witman 02/04/2014, 10:53 AM

## 2014-02-04 NOTE — Patient Instructions (Signed)
Take a suppository for your hemorrhoid as directed. If you decide you want to have hernia surgery, call us back.

## 2014-02-24 DIAGNOSIS — L84 Corns and callosities: Secondary | ICD-10-CM | POA: Diagnosis not present

## 2014-02-24 DIAGNOSIS — M2040 Other hammer toe(s) (acquired), unspecified foot: Secondary | ICD-10-CM | POA: Diagnosis not present

## 2014-02-24 DIAGNOSIS — M7750 Other enthesopathy of unspecified foot: Secondary | ICD-10-CM | POA: Diagnosis not present

## 2014-03-17 DIAGNOSIS — H0019 Chalazion unspecified eye, unspecified eyelid: Secondary | ICD-10-CM | POA: Diagnosis not present

## 2014-03-17 DIAGNOSIS — M7750 Other enthesopathy of unspecified foot: Secondary | ICD-10-CM | POA: Diagnosis not present

## 2014-03-24 ENCOUNTER — Telehealth: Payer: Self-pay

## 2014-03-24 DIAGNOSIS — H0019 Chalazion unspecified eye, unspecified eyelid: Secondary | ICD-10-CM | POA: Diagnosis not present

## 2014-03-24 NOTE — Telephone Encounter (Signed)
[facility] records show pt was not on losartan, but it is on her medication list here. I am not sure if she should be taking this medication or not. We will need to confirm with pt that she is taking medication and then check with Dr. Marlou Porch after we speak with patient.

## 2014-04-07 ENCOUNTER — Telehealth: Payer: Self-pay | Admitting: Cardiology

## 2014-04-07 DIAGNOSIS — E785 Hyperlipidemia, unspecified: Secondary | ICD-10-CM | POA: Diagnosis not present

## 2014-04-07 DIAGNOSIS — K429 Umbilical hernia without obstruction or gangrene: Secondary | ICD-10-CM | POA: Diagnosis not present

## 2014-04-07 MED ORDER — LOSARTAN POTASSIUM 100 MG PO TABS: 100.0000 mg | ORAL_TABLET | Freq: Every day | ORAL | Status: DC

## 2014-04-07 NOTE — Telephone Encounter (Signed)
New Prob    Pt has some questions regarding her medications. Please call.

## 2014-04-07 NOTE — Telephone Encounter (Signed)
Patient called to find out why the pharmacy hasn't refilled her losartan. Patient was taking this when she came for her visit with Dr. Marlou Porch in February. He made no changes to this medications during that visit. On 4/16 losartan was removed from the medication list as a discontinued medication when patient was at appointment to have her umbilical hernia evaluated.   Losartan 100 mg once daily re ordered at this time. Will forward to Dr. Marlou Porch to review.

## 2014-04-07 NOTE — Telephone Encounter (Signed)
Agree. Wells Mabe, MD  

## 2014-04-14 DIAGNOSIS — C44529 Squamous cell carcinoma of skin of other part of trunk: Secondary | ICD-10-CM | POA: Diagnosis not present

## 2014-04-14 DIAGNOSIS — L570 Actinic keratosis: Secondary | ICD-10-CM | POA: Diagnosis not present

## 2014-04-14 DIAGNOSIS — D235 Other benign neoplasm of skin of trunk: Secondary | ICD-10-CM | POA: Diagnosis not present

## 2014-04-14 DIAGNOSIS — L820 Inflamed seborrheic keratosis: Secondary | ICD-10-CM | POA: Diagnosis not present

## 2014-05-07 ENCOUNTER — Telehealth: Payer: Self-pay | Admitting: Pulmonary Disease

## 2014-05-07 ENCOUNTER — Other Ambulatory Visit: Payer: Self-pay | Admitting: Pulmonary Disease

## 2014-05-07 MED ORDER — IPRATROPIUM-ALBUTEROL 20-100 MCG/ACT IN AERS: 1.0000 | INHALATION_SPRAY | Freq: Four times a day (QID) | RESPIRATORY_TRACT | Status: DC | PRN

## 2014-05-07 NOTE — Telephone Encounter (Signed)
Spoke with the pt to verify msg  Rx was sent to pharm for combivent  Nothing further needed per pt

## 2014-05-14 DIAGNOSIS — Z85828 Personal history of other malignant neoplasm of skin: Secondary | ICD-10-CM | POA: Diagnosis not present

## 2014-05-14 DIAGNOSIS — L570 Actinic keratosis: Secondary | ICD-10-CM | POA: Diagnosis not present

## 2014-05-14 DIAGNOSIS — L820 Inflamed seborrheic keratosis: Secondary | ICD-10-CM | POA: Diagnosis not present

## 2014-06-16 ENCOUNTER — Other Ambulatory Visit: Payer: Self-pay

## 2014-06-16 DIAGNOSIS — Z1231 Encounter for screening mammogram for malignant neoplasm of breast: Secondary | ICD-10-CM

## 2014-07-07 ENCOUNTER — Ambulatory Visit: Payer: Medicare Other

## 2014-07-08 ENCOUNTER — Ambulatory Visit
Admission: RE | Admit: 2014-07-08 | Discharge: 2014-07-08 | Disposition: A | Discharge status: Home / Self Care | Payer: Medicare Other | Source: Ambulatory Visit

## 2014-07-08 DIAGNOSIS — Z1231 Encounter for screening mammogram for malignant neoplasm of breast: Secondary | ICD-10-CM | POA: Diagnosis not present

## 2014-07-09 ENCOUNTER — Other Ambulatory Visit: Payer: Self-pay | Admitting: Family Medicine

## 2014-07-09 DIAGNOSIS — R928 Other abnormal and inconclusive findings on diagnostic imaging of breast: Secondary | ICD-10-CM

## 2014-07-14 ENCOUNTER — Ambulatory Visit: Payer: Medicare Other | Admitting: Adult Health

## 2014-07-14 ENCOUNTER — Other Ambulatory Visit: Payer: Medicare Other

## 2014-07-19 ENCOUNTER — Ambulatory Visit
Admission: RE | Admit: 2014-07-19 | Discharge: 2014-07-19 | Disposition: A | Discharge status: Home / Self Care | Payer: Medicare Other | Source: Ambulatory Visit | Attending: Family Medicine | Admitting: Family Medicine

## 2014-07-19 ENCOUNTER — Ambulatory Visit (INDEPENDENT_AMBULATORY_CARE_PROVIDER_SITE_OTHER)
Admission: RE | Admit: 2014-07-19 | Discharge: 2014-07-19 | Disposition: A | Discharge status: Home / Self Care | Payer: Medicare Other | Source: Ambulatory Visit | Attending: Pulmonary Disease | Admitting: Pulmonary Disease

## 2014-07-19 DIAGNOSIS — R928 Other abnormal and inconclusive findings on diagnostic imaging of breast: Secondary | ICD-10-CM

## 2014-07-19 DIAGNOSIS — R911 Solitary pulmonary nodule: Secondary | ICD-10-CM | POA: Diagnosis not present

## 2014-07-19 DIAGNOSIS — N649 Disorder of breast, unspecified: Secondary | ICD-10-CM | POA: Diagnosis not present

## 2014-07-19 DIAGNOSIS — J8410 Pulmonary fibrosis, unspecified: Secondary | ICD-10-CM | POA: Diagnosis not present

## 2014-07-19 DIAGNOSIS — R918 Other nonspecific abnormal finding of lung field: Secondary | ICD-10-CM | POA: Diagnosis not present

## 2014-07-21 ENCOUNTER — Telehealth: Payer: Self-pay | Admitting: *Deleted

## 2014-07-21 ENCOUNTER — Other Ambulatory Visit: Payer: Medicare Other

## 2014-07-21 NOTE — Telephone Encounter (Signed)
OK to fill - pt has a HX of CAD.

## 2014-07-21 NOTE — Telephone Encounter (Signed)
Patient requests nitro refill. This is not on her med list. Ok to refill? Please advise. Thanks, MI

## 2014-07-22 ENCOUNTER — Other Ambulatory Visit: Payer: Self-pay | Admitting: *Deleted

## 2014-07-22 MED ORDER — NITROGLYCERIN 0.4 MG SL SUBL: 0.4000 mg | SUBLINGUAL_TABLET | SUBLINGUAL | Status: DC | PRN

## 2014-07-23 ENCOUNTER — Ambulatory Visit (INDEPENDENT_AMBULATORY_CARE_PROVIDER_SITE_OTHER): Payer: Medicare Other | Admitting: Adult Health

## 2014-07-23 ENCOUNTER — Encounter: Payer: Self-pay | Admitting: Adult Health

## 2014-07-23 VITALS — BP 112/62 | HR 65 | Temp 97.4°F | Wt 199.2 lb

## 2014-07-23 DIAGNOSIS — R059 Cough, unspecified: Secondary | ICD-10-CM

## 2014-07-23 DIAGNOSIS — J309 Allergic rhinitis, unspecified: Secondary | ICD-10-CM | POA: Diagnosis not present

## 2014-07-23 DIAGNOSIS — J849 Interstitial pulmonary disease, unspecified: Secondary | ICD-10-CM

## 2014-07-23 DIAGNOSIS — R05 Cough: Secondary | ICD-10-CM

## 2014-07-23 DIAGNOSIS — R911 Solitary pulmonary nodule: Secondary | ICD-10-CM

## 2014-07-23 DIAGNOSIS — Z23 Encounter for immunization: Secondary | ICD-10-CM

## 2014-07-23 DIAGNOSIS — I208 Other forms of angina pectoris: Secondary | ICD-10-CM

## 2014-07-23 NOTE — Patient Instructions (Signed)
Flu shot Begin Delsym 2 tsp Twice daily  For cough  Tessalon Three times a day  As needed  Cough.  Continue  Zyrtec 10mg  daily in am for nasal drip.  Continue Chlortrimeton 4mg  1 At bedtime  As needed  For drainage.  Add Pepcid 20mg  At bedtime   Continue Nexium Twice daily  (may use otc brand until drug assistance comes in)  follow up Dr. Elsworth Soho  In 6 weeks and As needed

## 2014-07-23 NOTE — Progress Notes (Signed)
   Subjective:    Patient ID: Cindy [name], female    DOB: [birth year], 80 y.o.   MRN: [id number]  HPI   PCP [facility]  Cards- Skains   80 year-old remote ex-smoker for FU of ILD & stable RLL pulmonary nodule. She presented in 2013 with persistent wheezing and cough.  x 2 1/2 years.  Had a CABG in 64, Tessalon Perles have not helped. Tussionex caused her to be drowsy in 911 had to be called. Chest x-ray from 03/11/2012 is reported normal. From 10/2011 shows minimal scarring and prominent interstitium.  She smoked 1.5 packs per day for 25 years before quitting in '81 at the time of her bypass.   HRCT 03/2012 Appearance of the lung parenchyma is compatible with an underlying interstitial lung disease. the pattern is nonspecific, and could represent either early usual interstitial pneumonia (UIP), or nonspecific interstitial pneumonia (NSIP). 46mm RLL nodule  PFTs no airway obstruction, preserved lung volumes, isolated decrease in DLCO to 56% corrects for [place].      Husband says he can hear wheezing when she lies down. Has bad reflux to point in throat , was on nexium Twice daily  Spirometry 04/2013 - preserved lung function w/ no airflow obstruction.  Cough improved - tessalon helps CT chest 06/2013 Unchanged nodules  & scarring Does not desatn on walking  07/23/2014 Follow ILD and CT results  Returns for 6 month follow up for ILD and to review CT results .  Pt has hx of Mild ILD changes on CT w/ previous PFT showing preserved lung volumes. O2 sats have been normal .  Pt underwent CT chest 07/19/14 that showed mild subpleural fibroiss unchanged from 03/2012. Scattered pulmonary nodules are unchanged (considered benign with 2 yr serial follow up ) .  Says overall she is doing well , does have daily dry cough, audible wheezing in throat on/off. Does complain of constant nasal drip and drainage.  No chest pain, orthopnea, edema , hemoptysis, n/v/d.  Does have bad reflux takes nexium Twice daily    Was recommeneded to add chlortab and pepcid but did not do this.      Review of Systems  neg for any significant sore throat, dysphagia, itching, sneezing, nasal congestion or excess/ purulent secretions, fever, chills, sweats, unintended wt loss, pleuritic or exertional cp, hempoptysis, orthopnea pnd or change in chronic leg swelling. Also denies presyncope, palpitations, heartburn, abdominal pain, nausea, vomiting, diarrhea or change in bowel or urinary habits, dysuria,hematuria, rash, arthralgias, visual complaints, headache, numbness weakness or ataxia.     Objective:   Physical Exam   Gen. Pleasant, obese, in no distress ENT - no lesions, no post nasal drip Neck: No JVD, no thyromegaly, no carotid bruits Lungs: no use of accessory muscles, no dullness to percussion, decreased without rales or rhonchi  Cardiovascular: Rhythm regular, heart sounds  normal, no murmurs or gallops, no peripheral edema Musculoskeletal: No deformities, no cyanosis or clubbing , no tremors       Assessment & Plan:

## 2014-07-23 NOTE — Assessment & Plan Note (Addendum)
Stable changes on CT  No desaturations noted.  Symptomatic cough ? Triggers of AR/GERD making worse.   Plan  Flu shot Begin Delsym 2 tsp Twice daily  For cough  Tessalon Three times a day  As needed  Cough.  Continue  Zyrtec 10mg  daily in am for nasal drip.  Continue Chlortrimeton 4mg  1 At bedtime  As needed  For drainage.  Add Pepcid 20mg  At bedtime   Continue Nexium Twice daily  (may use otc brand until drug assistance comes in)  follow up Dr. Elsworth Soho  In 6 weeks and As needed

## 2014-07-23 NOTE — Assessment & Plan Note (Signed)
Continue  Zyrtec 10mg  daily in am for nasal drip.  Continue Chlortrimeton 4mg  1 At bedtime  As needed  For drainage.  follow up Dr. Elsworth Soho  In 6 weeks and As needed

## 2014-07-23 NOTE — Assessment & Plan Note (Signed)
Combination of GERD/ upper airway cough/ ? ILD vs bronchiectasis tx for triggers  Delsym /zyrtec /chlortabl and PPI /pepcid  If not improving will need further consideration

## 2014-07-24 NOTE — Progress Notes (Signed)
Reviewed & agree with plan  

## 2014-07-29 ENCOUNTER — Ambulatory Visit: Payer: Medicare Other | Admitting: Cardiology

## 2014-07-29 ENCOUNTER — Other Ambulatory Visit: Payer: Medicare Other

## 2014-08-18 DIAGNOSIS — H3531 Nonexudative age-related macular degeneration: Secondary | ICD-10-CM | POA: Diagnosis not present

## 2014-09-03 ENCOUNTER — Encounter: Payer: Self-pay | Admitting: Cardiology

## 2014-09-03 ENCOUNTER — Ambulatory Visit (INDEPENDENT_AMBULATORY_CARE_PROVIDER_SITE_OTHER): Payer: Medicare Other | Admitting: Cardiology

## 2014-09-03 VITALS — BP 122/54 | HR 59 | Ht 61.5 in | Wt 179.0 lb

## 2014-09-03 DIAGNOSIS — I252 Old myocardial infarction: Secondary | ICD-10-CM | POA: Diagnosis not present

## 2014-09-03 DIAGNOSIS — I2510 Atherosclerotic heart disease of native coronary artery without angina pectoris: Secondary | ICD-10-CM

## 2014-09-03 DIAGNOSIS — J849 Interstitial pulmonary disease, unspecified: Secondary | ICD-10-CM

## 2014-09-03 DIAGNOSIS — Z87891 Personal history of nicotine dependence: Secondary | ICD-10-CM | POA: Diagnosis not present

## 2014-09-03 DIAGNOSIS — I208 Other forms of angina pectoris: Secondary | ICD-10-CM

## 2014-09-03 NOTE — Progress Notes (Signed)
[name]. [address], [address], [place]  [address] Phone: [phone] Fax:  [phone]  Date:  09/03/2014   ID:  Cindy [name], DOB 08-Jan-1932, MRN [id number]  PCP:  Milagros Evener, MD   History of Present Illness: Cindy [name] is a 80 y.o. female with coronary artery disease status post myocardial infarction at age 87, hypertension, hyperlipidemia, nonspecific interstitial lung disease, with bypass surgery in [place] in 1981 here for six-month followup.  2 previous stents were placed in 2005, kissing stents, one in 2009 and a PTCA in 2010.  Had episode of jaw pain and back pain in [place]. Took NTG. No change. Went to ER. EKG and blood work normal. Did not want to stay overnight. Been OK since.   At night will feel mild discomfort in chest (GERD). She has to sit up at times needing to sit up to eat. NTG can help.   She has lost approximate 10 pounds. Doing great. Keep up the good work.    Wt Readings from Last 3 Encounters:  09/03/14 179 lb (81.194 kg)  07/23/14 199 lb 3.2 oz (90.357 kg)  02/04/14 205 lb 6.4 oz (93.169 kg)     Past Medical History  Diagnosis Date  . Bronchitis   . Hypertension   . Thyroid disease   . Coronary artery disease     MI age 44  . Hypercholesteremia   . GERD (gastroesophageal reflux disease)   . COPD (chronic obstructive pulmonary disease)   . Iron deficiency anemia     used to see hem in [place], [place].   [name] Renal insufficiency   . Asthma   . Heart attack   . Skin cancer     Past Surgical History  Procedure Laterality Date  . Coronary artery bypass graft  09/1979  . Coronary angioplasty with stent placement    . Moh's surgeries      for squamous cell carcinoma  . Partial hysterectomy  10/1978  . Appendectomy      Current Outpatient Prescriptions  Medication Sig Dispense Refill  . amitriptyline (ELAVIL) 25 MG tablet Take 25 mg by mouth at bedtime.     [name] aspirin 81 MG tablet Take 81 mg by mouth daily.       . calcium-vitamin D (OSCAL WITH D) 500-200 MG-UNIT per tablet Take 1 tablet by mouth daily.    . COMBIVENT RESPIMAT 20-100 MCG/ACT AERS respimat INHALE ONE PUFF INTO THEN LUNGS 4 TIMES DAILY AS NEEDED 1 Inhaler 3  . CRESTOR 20 MG tablet Take 1 tablet by mouth daily.    [name] esomeprazole (NEXIUM) 40 MG capsule Take 40 mg by mouth 2 (two) times daily.      . furosemide (LASIX) 40 MG tablet Take 40 mg by mouth daily. For fluid     . isosorbide mononitrate (IMDUR) 60 MG 24 hr tablet Take 1 tablet (60 mg total) by mouth daily. 90 tablet 1  . levothyroxine (SYNTHROID, LEVOTHROID) 150 MCG tablet Take 150 mcg by mouth daily.      [name] losartan (COZAAR) 100 MG tablet Take 1 tablet (100 mg total) by mouth daily. 90 tablet 3  . metoprolol (TOPROL-XL) 50 MG 24 hr tablet Take 50 mg by mouth daily.      . nitroGLYCERIN (NITROSTAT) 0.4 MG SL tablet Place 1 tablet (0.4 mg total) under the tongue every 5 (five) minutes as needed for chest pain. 25 tablet 1  . spironolactone-hydrochlorothiazide (ALDACTAZIDE) 25-25 MG per  tablet Take 1 tablet by mouth daily.      . traMADol (ULTRAM) 50 MG tablet Take 50 mg by mouth 2 (two) times daily as needed. For pain. Maximum dose= 8 tablets per day     No current facility-administered medications for this visit.    Allergies:    Allergies  Allergen Reactions  . Codeine Other (See Comments)    Headache from heavy doses of codeine only    Social History:  The patient  reports that she quit smoking about 34 years ago. Her smoking use included Cigarettes. She has a 33 pack-year smoking history. She has never used smokeless tobacco. She reports that she drinks alcohol. She reports that she does not use illicit drugs.   ROS:  Please see the history of present illness.   Denies any fevers, chills, orthopnea, PND.    PHYSICAL EXAM: VS:  BP 122/54 mmHg  Pulse 59  Ht 5' 1.5" (1.562 m)  Wt 179 lb (81.194 kg)  BMI 33.28 kg/m2 Well nourished, well developed, in no acute  distress HEENT: normal Neck: no JVDLeft sided bruit.  Cardiac:  normal S1, S2; RRR; soft systolic murmur Lungs:  clear to auscultation bilaterally, no wheezing, rhonchi or rales Abd: soft, nontender, no hepatomegalyOverweight Ext: no edema Skin: warm and dry Neuro: no focal abnormalities noted  EKG:  Sinus rhythm, first degree AV block, nonspecific intraventricular conduction delay, QRS duration 146 ms. Nonspecific ST-T wave changes.  Nuclear stress test: 12/31/13-moderate inferior scar as well as apical scar. No significant ischemia.  LABS: 10/07/13- LDL 142, hemoglobin 12.5, creatinine 1.1  ASSESSMENT AND PLAN:  1. Coronary artery disease-status post bypass surgery in 1981. Subsequent stent placement. Nuclear stress test demonstrated inferior apical scar. No ischemia. Continue with medical management. 2. Old myocardial infarction-age 60. Continue with aggressive secondary prevention. 3. History of tobacco use-quit smoking several years ago. 4. Angina- under good control.  5. Hyperlipidemia-LDL goal is 70. Back on Crestor. Dr. Radene Ou has checked her LDL. Excellent. 6. Obesity-encourage weight loss 7. GERD-continue with Nexium. Likely her prior symptoms at night. 8. First degree AVB - stable.  9. COPD/ILD-per primary team. Stable. Reviewed pulmonology note.  Signed, Candee Furbish, MD [facility]  09/03/2014 11:37 AM

## 2014-09-03 NOTE — Patient Instructions (Signed)
The current medical regimen is effective;  continue present plan and medications.  Follow up in 6 months with Dr. Skains.  You will receive a letter in the mail 2 months before you are due.  Please call us when you receive this letter to schedule your follow up appointment.  

## 2014-09-08 ENCOUNTER — Ambulatory Visit (INDEPENDENT_AMBULATORY_CARE_PROVIDER_SITE_OTHER): Payer: Medicare Other | Admitting: Pulmonary Disease

## 2014-09-08 ENCOUNTER — Encounter: Payer: Self-pay | Admitting: Pulmonary Disease

## 2014-09-08 ENCOUNTER — Encounter (INDEPENDENT_AMBULATORY_CARE_PROVIDER_SITE_OTHER): Payer: Self-pay

## 2014-09-08 VITALS — BP 102/62 | HR 63 | Ht 61.5 in | Wt 199.4 lb

## 2014-09-08 DIAGNOSIS — J849 Interstitial pulmonary disease, unspecified: Secondary | ICD-10-CM | POA: Diagnosis not present

## 2014-09-08 DIAGNOSIS — I208 Other forms of angina pectoris: Secondary | ICD-10-CM | POA: Diagnosis not present

## 2014-09-08 DIAGNOSIS — R911 Solitary pulmonary nodule: Secondary | ICD-10-CM | POA: Diagnosis not present

## 2014-09-08 DIAGNOSIS — R05 Cough: Secondary | ICD-10-CM | POA: Diagnosis not present

## 2014-09-08 DIAGNOSIS — R059 Cough, unspecified: Secondary | ICD-10-CM

## 2014-09-08 MED ORDER — PANTOPRAZOLE SODIUM 40 MG PO TBEC: 40.0000 mg | DELAYED_RELEASE_TABLET | Freq: Two times a day (BID) | ORAL | Status: DC

## 2014-09-08 NOTE — Progress Notes (Signed)
   Subjective:    Patient ID: Cindy [name], female    DOB: 01/27/1932, 80 y.o.   MRN: [id number]  HPI  PCP [facility]  Cards- Skains   80 year-old remote ex-smoker for FU of ILD & stable RLL pulmonary nodule. She presented in 2013 with persistent wheezing and cough x 2 1/2 years.  Had a CABG in 36, Tessalon Perles have not helped. Tussionex caused her to be drowsy in 911 had to be called.  She smoked 1.5 packs per day for 25 years before quitting in '81 at the time of her bypass.    Significant tests/ events  HRCT 03/2012 Appearance of the lung parenchyma is compatible with an underlying interstitial lung disease. the pattern is nonspecific, and could represent either early usual interstitial pneumonia (UIP), or nonspecific interstitial pneumonia (NSIP). 67mm RLL nodule  PFTs 03/2012 no airway obstruction, preserved lung volumes, isolated decrease in DLCO to 56% corrects for [place].   Spirometry 04/2013 - preserved lung function w/ no airflow obstruction.  CT chest 06/2013 Unchanged nodules & scarring  CT chest 07/19/14 -mild subpleural fibroiss unchanged from 03/2012. Scattered pulmonary nodules are unchanged (considered benign with 2 yr serial follow up ) .   09/08/2014  Chief Complaint  Patient presents with  . Follow-up    F/U Pulmonary Nodule; wheezing, was told it is coming from her throat and not lungs; some cough   Had bad reflux , was on nexium Twice daily  Will not be able to get nexium anymore  Says overall she is doing well , does have daily dry cough, audible wheezing in throat on/off. Does complain of constant nasal drip and drainage.  No chest pain, orthopnea, edema , hemoptysis, n/v/d.    Review of Systems neg for any significant sore throat, dysphagia, itching, sneezing, nasal congestion or excess/ purulent secretions, fever, chills, sweats, unintended wt loss, pleuritic or exertional cp, hempoptysis, orthopnea pnd or change in chronic leg swelling. Also denies  presyncope, palpitations, heartburn, abdominal pain, nausea, vomiting, diarrhea or change in bowel or urinary habits, dysuria,hematuria, rash, arthralgias, visual complaints, headache, numbness weakness or ataxia.     Objective:   Physical Exam  Gen. Pleasant, obese, in no distress ENT - no lesions, no post nasal drip Neck: No JVD, no thyromegaly, no carotid bruits Lungs: no use of accessory muscles, no dullness to percussion, decreased without rales or rhonchi  Cardiovascular: Rhythm regular, heart sounds  normal, no murmurs or gallops, no peripheral edema Musculoskeletal: No deformities, no cyanosis or clubbing , no tremors       Assessment & Plan:

## 2014-09-08 NOTE — Assessment & Plan Note (Signed)
Appears stable  - ? Post inflammatory scarring vs early IPD, appears to be no progressive

## 2014-09-08 NOTE — Assessment & Plan Note (Signed)
Protonix 40 twice daily x 1 month x 3 refills -instead of nexium

## 2014-09-08 NOTE — Patient Instructions (Signed)
Protonix 40 twice daily x 1 month x 3 refills Scar tissue in your lungs is stable

## 2014-09-08 NOTE — Assessment & Plan Note (Signed)
Stable x 2 yrs, no further imaging required

## 2014-11-01 DIAGNOSIS — E039 Hypothyroidism, unspecified: Secondary | ICD-10-CM | POA: Diagnosis not present

## 2014-11-01 DIAGNOSIS — E785 Hyperlipidemia, unspecified: Secondary | ICD-10-CM | POA: Diagnosis not present

## 2014-11-01 DIAGNOSIS — Z23 Encounter for immunization: Secondary | ICD-10-CM | POA: Diagnosis not present

## 2014-11-01 DIAGNOSIS — M67439 Ganglion, unspecified wrist: Secondary | ICD-10-CM | POA: Diagnosis not present

## 2014-11-29 DIAGNOSIS — N189 Chronic kidney disease, unspecified: Secondary | ICD-10-CM | POA: Diagnosis not present

## 2014-12-15 DIAGNOSIS — X32XXXD Exposure to sunlight, subsequent encounter: Secondary | ICD-10-CM | POA: Diagnosis not present

## 2014-12-15 DIAGNOSIS — D225 Melanocytic nevi of trunk: Secondary | ICD-10-CM | POA: Diagnosis not present

## 2014-12-15 DIAGNOSIS — L570 Actinic keratosis: Secondary | ICD-10-CM | POA: Diagnosis not present

## 2015-01-06 DIAGNOSIS — M19072 Primary osteoarthritis, left ankle and foot: Secondary | ICD-10-CM | POA: Diagnosis not present

## 2015-03-04 ENCOUNTER — Ambulatory Visit (INDEPENDENT_AMBULATORY_CARE_PROVIDER_SITE_OTHER): Payer: Medicare Other | Admitting: Cardiology

## 2015-03-04 ENCOUNTER — Encounter: Payer: Self-pay | Admitting: Cardiology

## 2015-03-04 VITALS — BP 148/62 | HR 66 | Ht 61.5 in | Wt 189.8 lb

## 2015-03-04 DIAGNOSIS — I208 Other forms of angina pectoris: Secondary | ICD-10-CM | POA: Diagnosis not present

## 2015-03-04 DIAGNOSIS — J849 Interstitial pulmonary disease, unspecified: Secondary | ICD-10-CM | POA: Diagnosis not present

## 2015-03-04 DIAGNOSIS — I2510 Atherosclerotic heart disease of native coronary artery without angina pectoris: Secondary | ICD-10-CM | POA: Diagnosis not present

## 2015-03-04 DIAGNOSIS — I252 Old myocardial infarction: Secondary | ICD-10-CM

## 2015-03-04 NOTE — Progress Notes (Signed)
[facility] [address], [address] [place], [place]  [address] Phone: [phone] Fax:  [phone]  Date:  03/04/2015   ID:  Cindy [name], DOB [birth year], MRN [id number]  PCP:  Milagros Evener, MD   History of Present Illness: Cindy [name] is a 81 y.o. female with coronary artery disease status post myocardial infarction at age 46, hypertension, hyperlipidemia, nonspecific interstitial lung disease, with bypass surgery in [place] in 1981 here for six-month followup.  2 previous stents were placed in 2005, kissing stents, one in 2009 and a PTCA in 2010.  Had episode of jaw pain and back pain in [place]. Took NTG. No change. Went to ER. EKG and blood work normal. Did not want to stay overnight. Been OK since.   At night will feel mild discomfort in chest (GERD). She has to sit up at times needing to sit up to eat. NTG can help.   She has lost approximate 10 pounds. Doing great. Keep up the good work.  Lost son 58. Died. Sudden death. He was resuscitated briefly but then died in the ambulance.    Wt Readings from Last 3 Encounters:  03/04/15 189 lb 12.8 oz (86.093 kg)  09/08/14 199 lb 6.4 oz (90.447 kg)  09/03/14 179 lb (81.194 kg)     Past Medical History  Diagnosis Date  . Bronchitis   . Hypertension   . Thyroid disease   . Coronary artery disease     MI age 72  . Hypercholesteremia   . GERD (gastroesophageal reflux disease)   . COPD (chronic obstructive pulmonary disease)   . Iron deficiency anemia     used to see hem in [place], [place].   [name] Renal insufficiency   . Asthma   . Heart attack   . Skin cancer     Past Surgical History  Procedure Laterality Date  . Coronary artery bypass graft  09/1979  . Coronary angioplasty with stent placement    . Moh's surgeries      for squamous cell carcinoma  . Partial hysterectomy  10/1978  . Appendectomy      Current Outpatient Prescriptions  Medication Sig Dispense Refill  . amitriptyline (ELAVIL) 25 MG  tablet Take 25 mg by mouth at bedtime.     [name] aspirin 81 MG tablet Take 81 mg by mouth daily.     . calcium-vitamin D (OSCAL WITH D) 500-200 MG-UNIT per tablet Take 1 tablet by mouth daily.    . CRESTOR 20 MG tablet Take 1 tablet by mouth daily.    [name] esomeprazole (NEXIUM) 40 MG capsule Take 40 mg by mouth 2 (two) times daily.      . furosemide (LASIX) 40 MG tablet Take 40 mg by mouth daily. For fluid     . isosorbide mononitrate (IMDUR) 60 MG 24 hr tablet Take 1 tablet (60 mg total) by mouth daily. 90 tablet 1  . levothyroxine (SYNTHROID, LEVOTHROID) 150 MCG tablet Take 150 mcg by mouth daily.      [name] losartan (COZAAR) 100 MG tablet Take 1 tablet (100 mg total) by mouth daily. 90 tablet 3  . metoprolol (TOPROL-XL) 50 MG 24 hr tablet Take 50 mg by mouth daily.      . nitroGLYCERIN (NITROSTAT) 0.4 MG SL tablet Place 1 tablet (0.4 mg total) under the tongue every 5 (five) minutes as needed for chest pain. 25 tablet 1  . pantoprazole (PROTONIX) 40 MG tablet Take 1 tablet (40 mg total)  by mouth 2 (two) times daily. 60 tablet 5  . spironolactone-hydrochlorothiazide (ALDACTAZIDE) 25-25 MG per tablet Take 1 tablet by mouth daily.      . traMADol (ULTRAM) 50 MG tablet Take 50 mg by mouth 2 (two) times daily. For pain. Maximum dose= 8 tablets per day     No current facility-administered medications for this visit.    Allergies:    Allergies  Allergen Reactions  . Codeine Other (See Comments)    Headache from heavy doses of codeine only    Social History:  The patient  reports that she quit smoking about 35 years ago. Her smoking use included Cigarettes. She has a 33 pack-year smoking history. She has never used smokeless tobacco. She reports that she drinks alcohol. She reports that she does not use illicit drugs.   ROS:  Please see the history of present illness.   Denies any fevers, chills, orthopnea, PND.    PHYSICAL EXAM: VS:  BP 148/62 mmHg  Pulse 66  Ht 5' 1.5" (1.562 m)  Wt 189 lb 12.8  oz (86.093 kg)  BMI 35.29 kg/m2 Well nourished, well developed, in no acute distress HEENT: normal Neck: no JVDLeft sided bruit.  Cardiac:  normal S1, S2; RRR; soft systolic murmur Lungs:  clear to auscultation bilaterally, no wheezing, rhonchi or rales Abd: soft, nontender, no hepatomegalyOverweight Ext: no edema Skin: warm and dry Neuro: no focal abnormalities noted  EKG:  03/04/15-sinus rhythm, first-degree AV block, left bundle branch block.-Her Sinus rhythm, first degree AV block, nonspecific intraventricular conduction delay, QRS duration 146 ms. Nonspecific ST-T wave changes.  Nuclear stress test: 12/31/13-moderate inferior scar as well as apical scar. No significant ischemia.  LABS: 10/07/13- LDL 142, hemoglobin 12.5, creatinine 1.1  ASSESSMENT AND PLAN:  1. Coronary artery disease-status post bypass surgery in 1981. Subsequent stent placement. Nuclear stress test demonstrated inferior apical scar. No ischemia. Continue with medical management. 2. Old myocardial infarction-age 35. Continue with aggressive secondary prevention. 3. History of tobacco use-quit smoking several years ago. 4. Angina- under good control.  5. Hyperlipidemia-LDL goal is 70. Back on Crestor. Dr. Radene Ou has checked her LDL. Excellent. 6. Obesity-encourage weight loss 7. GERD-continue with Nexium. Likely her prior symptoms at night. 8. First degree AVB - stable. Left bundle branch block. No syncope. 9. COPD/ILD-per primary team. Stable. Reviewed pulmonology note.  Long discussion with she and her husband about the loss of her son. My condolences.  Signed, Candee Furbish, MD [facility]  03/04/2015 4:12 PM

## 2015-03-04 NOTE — Patient Instructions (Signed)
Medication Instructions:  Your physician recommends that you continue on your current medications as directed. Please refer to the Current Medication list given to you today.  Follow-Up: Follow up in 6 months with Dr. Skains.  You will receive a letter in the mail 2 months before you are due.  Please call us when you receive this letter to schedule your follow up appointment.  Thank you for choosing  [facility]!!     

## 2015-03-10 DIAGNOSIS — R195 Other fecal abnormalities: Secondary | ICD-10-CM | POA: Diagnosis not present

## 2015-03-11 DIAGNOSIS — N289 Disorder of kidney and ureter, unspecified: Secondary | ICD-10-CM | POA: Diagnosis not present

## 2015-03-11 DIAGNOSIS — E860 Dehydration: Secondary | ICD-10-CM | POA: Diagnosis not present

## 2015-03-11 DIAGNOSIS — R197 Diarrhea, unspecified: Secondary | ICD-10-CM | POA: Diagnosis not present

## 2015-03-14 DIAGNOSIS — R197 Diarrhea, unspecified: Secondary | ICD-10-CM | POA: Diagnosis not present

## 2015-03-14 DIAGNOSIS — N189 Chronic kidney disease, unspecified: Secondary | ICD-10-CM | POA: Diagnosis not present

## 2015-03-14 DIAGNOSIS — N289 Disorder of kidney and ureter, unspecified: Secondary | ICD-10-CM | POA: Diagnosis not present

## 2015-03-18 ENCOUNTER — Ambulatory Visit: Payer: Medicare Other | Admitting: Adult Health

## 2015-03-25 ENCOUNTER — Other Ambulatory Visit: Payer: Self-pay | Admitting: *Deleted

## 2015-03-25 MED ORDER — LOSARTAN POTASSIUM 100 MG PO TABS: 100.0000 mg | ORAL_TABLET | Freq: Every day | ORAL | Status: DC

## 2015-03-30 DIAGNOSIS — N189 Chronic kidney disease, unspecified: Secondary | ICD-10-CM | POA: Diagnosis not present

## 2015-03-30 DIAGNOSIS — E871 Hypo-osmolality and hyponatremia: Secondary | ICD-10-CM | POA: Diagnosis not present

## 2015-03-30 DIAGNOSIS — R197 Diarrhea, unspecified: Secondary | ICD-10-CM | POA: Diagnosis not present

## 2015-04-01 ENCOUNTER — Encounter: Payer: Self-pay | Admitting: Adult Health

## 2015-04-01 ENCOUNTER — Ambulatory Visit (INDEPENDENT_AMBULATORY_CARE_PROVIDER_SITE_OTHER): Payer: Medicare Other | Admitting: Adult Health

## 2015-04-01 VITALS — BP 112/58 | HR 72 | Temp 97.6°F | Wt 185.0 lb

## 2015-04-01 DIAGNOSIS — J849 Interstitial pulmonary disease, unspecified: Secondary | ICD-10-CM

## 2015-04-01 DIAGNOSIS — I208 Other forms of angina pectoris: Secondary | ICD-10-CM | POA: Diagnosis not present

## 2015-04-01 NOTE — Progress Notes (Signed)
   Subjective:    Patient ID: Cindy [name], female    DOB: [birth year], 81 y.o.   MRN: [id number]  HPI  PCP [facility]  Cards- Skains   81 year-old remote ex-smoker for FU of ILD & stable RLL pulmonary nodule. She presented in 2013 with persistent wheezing and cough x 2 1/2 years.  Had a CABG in 45, Tessalon Perles have not helped. Tussionex caused her to be drowsy in 911 had to be called.  She smoked 1.5 packs per day for 25 years before quitting in '81 at the time of her bypass.    Significant tests/ events  HRCT 03/2012 Appearance of the lung parenchyma is compatible with an underlying interstitial lung disease. the pattern is nonspecific, and could represent either early usual interstitial pneumonia (UIP), or nonspecific interstitial pneumonia (NSIP). 23mm RLL nodule  PFTs 03/2012 no airway obstruction, preserved lung volumes, isolated decrease in DLCO to 56% corrects for [place].   Spirometry 04/2013 - preserved lung function w/ no airflow obstruction.  CT chest 06/2013 Unchanged nodules & scarring  CT chest 07/19/14 -mild subpleural fibroiss unchanged from 03/2012. Scattered pulmonary nodules are unchanged (considered benign with 2 yr serial follow up ) .   09/08/2014  Chief Complaint  Patient presents with  . Follow-up    F/U Pulmonary Nodule; wheezing, was told it is coming from her throat and not lungs; some cough   Had bad reflux , was on nexium Twice daily  Will not be able to get nexium anymore  Says overall she is doing well , does have daily dry cough, audible wheezing in throat on/off. Does complain of constant nasal drip and drainage.  No chest pain, orthopnea, edema , hemoptysis, n/v/d.    04/01/2015 Follow up : ILD  Patient returns for six-month follow-up She says overall that her breathing is doing about the same She does get winded intermittently with activity. No flare of cough or shortness of breath. CT chest in September showed mild subpleural fibrosis Scattered  pulmonary nodules were unchanged from 2013. She denies any chest pain, orthopnea, PND, leg swelling or orthopnea.   Review of Systems neg for any significant sore throat, dysphagia, itching, sneezing, nasal congestion or excess/ purulent secretions, fever, chills, sweats, unintended wt loss, pleuritic or exertional cp, hempoptysis, orthopnea pnd or change in chronic leg swelling. Also denies presyncope, palpitations, heartburn, abdominal pain, nausea, vomiting, diarrhea or change in bowel or urinary habits, dysuria,hematuria, rash, arthralgias, visual complaints, headache, numbness weakness or ataxia.     Objective:   Physical Exam  Gen. Pleasant, obese, in no distress ENT - no lesions, no post nasal drip Neck: No JVD, no thyromegaly, no carotid bruits Lungs: no use of accessory muscles, no dullness to percussion, decreased without rales or rhonchi  Cardiovascular: Rhythm regular, heart sounds  normal, no murmurs or gallops,tr  peripheral edema Musculoskeletal: No deformities, no cyanosis or clubbing , no tremors       Assessment & Plan:

## 2015-04-01 NOTE — Assessment & Plan Note (Signed)
Compensated without flare   Plan Cont on current regimen  

## 2015-04-01 NOTE — Patient Instructions (Signed)
Continue on current regimen .  follow up Dr. Elsworth Soho  In 6 months

## 2015-04-04 DIAGNOSIS — D638 Anemia in other chronic diseases classified elsewhere: Secondary | ICD-10-CM | POA: Diagnosis not present

## 2015-04-04 DIAGNOSIS — E039 Hypothyroidism, unspecified: Secondary | ICD-10-CM | POA: Diagnosis not present

## 2015-04-04 DIAGNOSIS — I2510 Atherosclerotic heart disease of native coronary artery without angina pectoris: Secondary | ICD-10-CM | POA: Diagnosis not present

## 2015-04-04 DIAGNOSIS — N189 Chronic kidney disease, unspecified: Secondary | ICD-10-CM | POA: Diagnosis not present

## 2015-04-04 DIAGNOSIS — J449 Chronic obstructive pulmonary disease, unspecified: Secondary | ICD-10-CM | POA: Diagnosis not present

## 2015-04-04 DIAGNOSIS — R197 Diarrhea, unspecified: Secondary | ICD-10-CM | POA: Diagnosis not present

## 2015-04-04 DIAGNOSIS — E785 Hyperlipidemia, unspecified: Secondary | ICD-10-CM | POA: Diagnosis not present

## 2015-04-04 DIAGNOSIS — K219 Gastro-esophageal reflux disease without esophagitis: Secondary | ICD-10-CM | POA: Diagnosis not present

## 2015-04-04 DIAGNOSIS — I10 Essential (primary) hypertension: Secondary | ICD-10-CM | POA: Diagnosis not present

## 2015-04-04 NOTE — Progress Notes (Signed)
Reviewed & agree with plan  

## 2015-04-11 DIAGNOSIS — I10 Essential (primary) hypertension: Secondary | ICD-10-CM | POA: Diagnosis not present

## 2015-04-11 DIAGNOSIS — E039 Hypothyroidism, unspecified: Secondary | ICD-10-CM | POA: Diagnosis not present

## 2015-04-11 DIAGNOSIS — N189 Chronic kidney disease, unspecified: Secondary | ICD-10-CM | POA: Diagnosis not present

## 2015-04-11 DIAGNOSIS — E785 Hyperlipidemia, unspecified: Secondary | ICD-10-CM | POA: Diagnosis not present

## 2015-04-11 DIAGNOSIS — D638 Anemia in other chronic diseases classified elsewhere: Secondary | ICD-10-CM | POA: Diagnosis not present

## 2015-04-18 ENCOUNTER — Other Ambulatory Visit: Payer: Self-pay

## 2015-04-30 DIAGNOSIS — R600 Localized edema: Secondary | ICD-10-CM | POA: Diagnosis not present

## 2015-05-11 DIAGNOSIS — J849 Interstitial pulmonary disease, unspecified: Secondary | ICD-10-CM | POA: Diagnosis not present

## 2015-05-11 DIAGNOSIS — E039 Hypothyroidism, unspecified: Secondary | ICD-10-CM | POA: Diagnosis not present

## 2015-05-11 DIAGNOSIS — K219 Gastro-esophageal reflux disease without esophagitis: Secondary | ICD-10-CM | POA: Diagnosis not present

## 2015-05-11 DIAGNOSIS — D638 Anemia in other chronic diseases classified elsewhere: Secondary | ICD-10-CM | POA: Diagnosis not present

## 2015-05-11 DIAGNOSIS — E785 Hyperlipidemia, unspecified: Secondary | ICD-10-CM | POA: Diagnosis not present

## 2015-05-11 DIAGNOSIS — N189 Chronic kidney disease, unspecified: Secondary | ICD-10-CM | POA: Diagnosis not present

## 2015-05-11 DIAGNOSIS — I6529 Occlusion and stenosis of unspecified carotid artery: Secondary | ICD-10-CM | POA: Diagnosis not present

## 2015-05-11 DIAGNOSIS — I10 Essential (primary) hypertension: Secondary | ICD-10-CM | POA: Diagnosis not present

## 2015-06-10 ENCOUNTER — Other Ambulatory Visit: Payer: Self-pay | Admitting: Pulmonary Disease

## 2015-06-11 ENCOUNTER — Other Ambulatory Visit: Payer: Self-pay | Admitting: Pulmonary Disease

## 2015-06-16 ENCOUNTER — Other Ambulatory Visit: Payer: Self-pay | Admitting: Pulmonary Disease

## 2015-06-16 MED ORDER — PANTOPRAZOLE SODIUM 40 MG PO TBEC
40.0000 mg | DELAYED_RELEASE_TABLET | Freq: Two times a day (BID) | ORAL | Status: DC
Start: 2015-06-16 — End: 2015-12-29

## 2015-06-16 NOTE — Telephone Encounter (Signed)
Rx refilled  Nothing further needed.  

## 2015-06-22 DIAGNOSIS — K429 Umbilical hernia without obstruction or gangrene: Secondary | ICD-10-CM | POA: Diagnosis not present

## 2015-06-22 DIAGNOSIS — J309 Allergic rhinitis, unspecified: Secondary | ICD-10-CM | POA: Diagnosis not present

## 2015-06-22 DIAGNOSIS — Z23 Encounter for immunization: Secondary | ICD-10-CM | POA: Diagnosis not present

## 2015-06-22 DIAGNOSIS — R05 Cough: Secondary | ICD-10-CM | POA: Diagnosis not present

## 2015-06-22 DIAGNOSIS — H578 Other specified disorders of eye and adnexa: Secondary | ICD-10-CM | POA: Diagnosis not present

## 2015-07-28 ENCOUNTER — Other Ambulatory Visit: Payer: Self-pay

## 2015-07-28 DIAGNOSIS — Z1231 Encounter for screening mammogram for malignant neoplasm of breast: Secondary | ICD-10-CM

## 2015-08-19 ENCOUNTER — Ambulatory Visit: Payer: Medicare Other

## 2015-08-24 ENCOUNTER — Other Ambulatory Visit: Payer: Self-pay | Admitting: *Deleted

## 2015-08-24 ENCOUNTER — Ambulatory Visit
Admission: RE | Admit: 2015-08-24 | Discharge: 2015-08-24 | Disposition: A | Discharge status: Home / Self Care | Payer: Medicare Other | Source: Ambulatory Visit

## 2015-08-24 DIAGNOSIS — Z1231 Encounter for screening mammogram for malignant neoplasm of breast: Secondary | ICD-10-CM

## 2015-08-24 NOTE — Telephone Encounter (Signed)
recieved refill request for losartan 50 mg qd, not in pt record that this was changed, Magda Paganini said Dr. Mardene Sayer called it in , found that this was [facility], advised her to send the request to them.

## 2015-09-22 DIAGNOSIS — M315 Giant cell arteritis with polymyalgia rheumatica: Secondary | ICD-10-CM | POA: Diagnosis not present

## 2015-09-22 DIAGNOSIS — H04123 Dry eye syndrome of bilateral lacrimal glands: Secondary | ICD-10-CM | POA: Diagnosis not present

## 2015-09-23 ENCOUNTER — Encounter: Payer: Self-pay | Admitting: Cardiology

## 2015-09-23 ENCOUNTER — Ambulatory Visit (INDEPENDENT_AMBULATORY_CARE_PROVIDER_SITE_OTHER): Payer: Medicare Other | Admitting: Cardiology

## 2015-09-23 VITALS — BP 128/76 | HR 66 | Ht 61.5 in | Wt 182.4 lb

## 2015-09-23 DIAGNOSIS — I2510 Atherosclerotic heart disease of native coronary artery without angina pectoris: Secondary | ICD-10-CM

## 2015-09-23 DIAGNOSIS — I252 Old myocardial infarction: Secondary | ICD-10-CM

## 2015-09-23 DIAGNOSIS — I208 Other forms of angina pectoris: Secondary | ICD-10-CM | POA: Diagnosis not present

## 2015-09-23 DIAGNOSIS — E785 Hyperlipidemia, unspecified: Secondary | ICD-10-CM

## 2015-09-23 DIAGNOSIS — R51 Headache: Secondary | ICD-10-CM | POA: Diagnosis not present

## 2015-09-23 NOTE — Patient Instructions (Signed)

## 2015-09-23 NOTE — Progress Notes (Signed)
[name]. [address], [address] [place], [place]  [address] Phone: [phone] Fax:  [phone]  Date:  09/23/2015   ID:  Cindy [name], DOB 05-May-1932, MRN [id number]  PCP:  Aretta Nip, MD   History of Present Illness: Cindy [name] is a 81 y.o. female with coronary artery disease status post myocardial infarction at age 12, hypertension, hyperlipidemia, nonspecific interstitial lung disease, with bypass surgery in [place] in 1981 here for six-month followup.  2 previous stents were placed in 2005, kissing stents, one in 2009 and a PTCA in 2010.  Had episode of jaw pain and back pain in [place]. Took NTG. No change. Went to ER. EKG and blood work normal. Did not want to stay overnight. Been OK since.   At night will feel mild discomfort in chest (GERD). She has to sit up at times needing to sit up to eat. NTG can help. [place] uses this.  Lost son 37. Died. Sudden death. He was resuscitated briefly but then died in the ambulance.   Saw eye doctor, headaches, left temporal discomfort, setting up for evaluation with Dr. Harrington Challenger for exclusion of temporal arteritis.  Wt Readings from Last 3 Encounters:  09/23/15 182 lb 6.4 oz (82.736 kg)  04/01/15 185 lb (83.915 kg)  03/04/15 189 lb 12.8 oz (86.093 kg)     Past Medical History  Diagnosis Date  . Bronchitis   . Hypertension   . Thyroid disease   . Coronary artery disease     MI age 49  . Hypercholesteremia   . GERD (gastroesophageal reflux disease)   . COPD (chronic obstructive pulmonary disease) ([place])   . Iron deficiency anemia     used to see hem in [place], [place].   [name] Renal insufficiency   . Asthma   . Heart attack ([place])   . Skin cancer     Past Surgical History  Procedure Laterality Date  . Coronary artery bypass graft  09/1979  . Coronary angioplasty with stent placement    . Moh's surgeries      for squamous cell carcinoma  . Partial hysterectomy  10/1978  . Appendectomy      Current  Outpatient Prescriptions  Medication Sig Dispense Refill  . amitriptyline (ELAVIL) 25 MG tablet Take 25 mg by mouth at bedtime.     [name] aspirin 81 MG tablet Take 81 mg by mouth daily.     [name] atorvastatin (LIPITOR) 40 MG tablet     . calcium-vitamin D (OSCAL WITH D) 500-200 MG-UNIT per tablet Take 1 tablet by mouth daily.    . Cetirizine HCl (ZYRTEC ALLERGY PO) Take 10 mg by mouth daily.    . isosorbide mononitrate (IMDUR) 60 MG 24 hr tablet Take 1 tablet (60 mg total) by mouth daily. 90 tablet 1  . levothyroxine (SYNTHROID, LEVOTHROID) 150 MCG tablet Take 150 mcg by mouth daily.      [name] losartan (COZAAR) 100 MG tablet Take 1 tablet (100 mg total) by mouth daily. 90 tablet 3  . losartan (COZAAR) 50 MG tablet     . metoprolol (TOPROL-XL) 50 MG 24 hr tablet Take 50 mg by mouth daily.      . Multiple Vitamins-Minerals (PRESERVISION AREDS 2 PO) Take 1 capsule by mouth 2 (two) times daily.    . nitroGLYCERIN (NITROSTAT) 0.4 MG SL tablet Place 1 tablet (0.4 mg total) under the tongue every 5 (five) minutes as needed for chest pain. 25 tablet 1  .  pantoprazole (PROTONIX) 40 MG tablet Take 1 tablet (40 mg total) by mouth 2 (two) times daily. 60 tablet 5  . spironolactone-hydrochlorothiazide (ALDACTAZIDE) 25-25 MG per tablet Take 1 tablet by mouth daily.      . traMADol (ULTRAM) 50 MG tablet Take 50 mg by mouth 2 (two) times daily. For pain. Maximum dose= 8 tablets per day     No current facility-administered medications for this visit.    Allergies:    Allergies  Allergen Reactions  . Codeine Other (See Comments)    Headache from heavy doses of codeine only    Social History:  The patient  reports that she quit smoking about 35 years ago. Her smoking use included Cigarettes. She has a 33 pack-year smoking history. She has never used smokeless tobacco. She reports that she drinks alcohol. She reports that she does not use illicit drugs.   ROS:  Please see the history of present illness.   Denies any  fevers, chills, orthopnea, PND.    PHYSICAL EXAM: VS:  BP 128/76 mmHg  Pulse 66  Ht 5' 1.5" (1.562 m)  Wt 182 lb 6.4 oz (82.736 kg)  BMI 33.91 kg/m2  SpO2 97% Well nourished, well developed, in no acute distress HEENT: normal, very minimal tenderness over left temporal region Neck: no JVDLeft sided bruit.  Cardiac:  normal S1, S2; RRR; soft systolic murmur Lungs:  clear to auscultation bilaterally, no wheezing, rhonchi or rales Abd: soft, nontender, no hepatomegalyOverweight Ext: no edema Skin: warm and dry Neuro: no focal abnormalities noted  EKG:  03/04/15-sinus rhythm, first-degree AV block, left bundle branch block.-Her Sinus rhythm, first degree AV block, nonspecific intraventricular conduction delay, QRS duration 146 ms. Nonspecific ST-T wave changes.  Nuclear stress test: 12/31/13-moderate inferior scar as well as apical scar. No significant ischemia.  LABS: 10/07/13- LDL 142, hemoglobin 12.5, creatinine 1.1  ASSESSMENT AND PLAN:  1. Coronary artery disease-status post bypass surgery in 1981. Subsequent stent placement. Nuclear stress test demonstrated inferior apical scar. No ischemia. Continue with medical management. 2. Old myocardial infarction-age 50. Continue with aggressive secondary prevention. 3. History of tobacco use-quit smoking several years ago. 4. Angina- under good control.  5. Hyperlipidemia-LDL goal is 70. Back on Crestor. Dr. Radene Ou has checked her LDL. Excellent. 6. Obesity-encourage weight loss 7. GERD-continue with Nexium. Likely her prior symptoms at night. 8. First degree AVB - stable. Left bundle branch block. No syncope. 9. COPD/ILD-per primary team. Stable. Reviewed pulmonology note. 10. Headaches, mild left temporal pain. Mild temporal pain upon palpation. Saw the eye doctor earlier today who is referring to Dr. Harrington Challenger to make sure that she does not have any evidence of temporal arteritis.  Long discussion previously with she and her husband about  the loss of her son. My condolences. Her husband is a patient of Dr. Thompson Caul. Gave him a DVD [place] about [place] heart [place]  Signed, Candee Furbish, MD [facility]  09/23/2015 11:15 AM

## 2015-10-09 ENCOUNTER — Encounter (HOSPITAL_COMMUNITY): Payer: Self-pay | Admitting: *Deleted

## 2015-10-09 ENCOUNTER — Emergency Department (HOSPITAL_COMMUNITY): Payer: Medicare Other

## 2015-10-09 ENCOUNTER — Emergency Department (HOSPITAL_COMMUNITY)
Admission: EM | Admit: 2015-10-09 | Discharge: 2015-10-09 | Disposition: A | Discharge status: Home / Self Care | Payer: Medicare Other | Attending: Emergency Medicine | Admitting: Emergency Medicine

## 2015-10-09 DIAGNOSIS — R42 Dizziness and giddiness: Secondary | ICD-10-CM | POA: Diagnosis present

## 2015-10-09 DIAGNOSIS — Z7982 Long term (current) use of aspirin: Secondary | ICD-10-CM | POA: Diagnosis not present

## 2015-10-09 DIAGNOSIS — Z951 Presence of aortocoronary bypass graft: Secondary | ICD-10-CM | POA: Insufficient documentation

## 2015-10-09 DIAGNOSIS — Z79899 Other long term (current) drug therapy: Secondary | ICD-10-CM | POA: Diagnosis not present

## 2015-10-09 DIAGNOSIS — K219 Gastro-esophageal reflux disease without esophagitis: Secondary | ICD-10-CM | POA: Diagnosis not present

## 2015-10-09 DIAGNOSIS — Z9861 Coronary angioplasty status: Secondary | ICD-10-CM | POA: Diagnosis not present

## 2015-10-09 DIAGNOSIS — Z87891 Personal history of nicotine dependence: Secondary | ICD-10-CM | POA: Insufficient documentation

## 2015-10-09 DIAGNOSIS — E079 Disorder of thyroid, unspecified: Secondary | ICD-10-CM | POA: Diagnosis not present

## 2015-10-09 DIAGNOSIS — I252 Old myocardial infarction: Secondary | ICD-10-CM | POA: Diagnosis not present

## 2015-10-09 DIAGNOSIS — Z85828 Personal history of other malignant neoplasm of skin: Secondary | ICD-10-CM | POA: Diagnosis not present

## 2015-10-09 DIAGNOSIS — E7800 Pure hypercholesterolemia, unspecified: Secondary | ICD-10-CM | POA: Insufficient documentation

## 2015-10-09 DIAGNOSIS — R531 Weakness: Secondary | ICD-10-CM | POA: Diagnosis not present

## 2015-10-09 DIAGNOSIS — I10 Essential (primary) hypertension: Secondary | ICD-10-CM | POA: Insufficient documentation

## 2015-10-09 DIAGNOSIS — J449 Chronic obstructive pulmonary disease, unspecified: Secondary | ICD-10-CM | POA: Diagnosis not present

## 2015-10-09 DIAGNOSIS — D649 Anemia, unspecified: Secondary | ICD-10-CM | POA: Diagnosis not present

## 2015-10-09 DIAGNOSIS — R110 Nausea: Secondary | ICD-10-CM | POA: Diagnosis not present

## 2015-10-09 DIAGNOSIS — I2510 Atherosclerotic heart disease of native coronary artery without angina pectoris: Secondary | ICD-10-CM | POA: Diagnosis not present

## 2015-10-09 LAB — CBC WITH DIFFERENTIAL/PLATELET
Basophils Absolute: 0 10*3/uL (ref 0.0–0.1)
Basophils Relative: 0 %
EOS PCT: 6 %
Eosinophils Absolute: 0.4 10*3/uL (ref 0.0–0.7)
HCT: 32.7 % — ABNORMAL LOW (ref 36.0–46.0)
Hemoglobin: 10.7 g/dL — ABNORMAL LOW (ref 12.0–15.0)
LYMPHS ABS: 1 10*3/uL (ref 0.7–4.0)
Lymphocytes Relative: 18 %
MCH: 30.7 pg (ref 26.0–34.0)
MCHC: 32.7 g/dL (ref 30.0–36.0)
MCV: 93.7 fL (ref 78.0–100.0)
MONO ABS: 0.6 10*3/uL (ref 0.1–1.0)
Monocytes Relative: 10 %
Neutro Abs: 3.8 10*3/uL (ref 1.7–7.7)
Neutrophils Relative %: 66 %
PLATELETS: 148 10*3/uL — AB (ref 150–400)
RBC: 3.49 MIL/uL — ABNORMAL LOW (ref 3.87–5.11)
RDW: 13 % (ref 11.5–15.5)
WBC: 5.8 10*3/uL (ref 4.0–10.5)

## 2015-10-09 LAB — I-STAT TROPONIN, ED
TROPONIN I, POC: 0.01 ng/mL (ref 0.00–0.08)
TROPONIN I, POC: 0 ng/mL (ref 0.00–0.08)

## 2015-10-09 LAB — URINALYSIS, ROUTINE W REFLEX MICROSCOPIC
Bilirubin Urine: NEGATIVE
GLUCOSE, UA: NEGATIVE mg/dL
Hgb urine dipstick: NEGATIVE
Ketones, ur: NEGATIVE mg/dL
NITRITE: NEGATIVE
Protein, ur: NEGATIVE mg/dL
Specific Gravity, Urine: 1.011 (ref 1.005–1.030)
pH: 6.5 (ref 5.0–8.0)

## 2015-10-09 LAB — COMPREHENSIVE METABOLIC PANEL
ALT: 12 U/L — ABNORMAL LOW (ref 14–54)
ANION GAP: 8 (ref 5–15)
AST: 20 U/L (ref 15–41)
Albumin: 3.5 g/dL (ref 3.5–5.0)
Alkaline Phosphatase: 59 U/L (ref 38–126)
BUN: 28 mg/dL — ABNORMAL HIGH (ref 6–20)
CHLORIDE: 101 mmol/L (ref 101–111)
CO2: 28 mmol/L (ref 22–32)
CREATININE: 1.48 mg/dL — AB (ref 0.44–1.00)
Calcium: 9.9 mg/dL (ref 8.9–10.3)
GFR, EST AFRICAN AMERICAN: 37 mL/min — AB (ref >60)
GFR, EST NON AFRICAN AMERICAN: 32 mL/min — AB (ref >60)
Glucose, Bld: 108 mg/dL — ABNORMAL HIGH (ref 65–99)
POTASSIUM: 4.5 mmol/L (ref 3.5–5.1)
Sodium: 137 mmol/L (ref 135–145)
Total Bilirubin: 0.6 mg/dL (ref 0.3–1.2)
Total Protein: 6.7 g/dL (ref 6.5–8.1)

## 2015-10-09 LAB — URINE MICROSCOPIC-ADD ON

## 2015-10-09 LAB — LIPASE, BLOOD: Lipase: 36 U/L (ref 11–51)

## 2015-10-09 MED ORDER — SODIUM CHLORIDE 0.9 % IV BOLUS (SEPSIS)
500.0000 mL | Freq: Once | INTRAVENOUS | Status: AC
  Administered 2015-10-09: 500 mL via INTRAVENOUS

## 2015-10-09 NOTE — ED Notes (Signed)
Pt able to ambulate to bathroom with stand-by assistance.  States her weakness is greatly improved.

## 2015-10-09 NOTE — ED Notes (Signed)
Pt reports feeling fine this am, then progressively having increase in generalized weakness. Reports recent diarrhea x 2 days, denies vomiting. Pt had onset of nausea at time of weakness and felt near syncopal. Hx of similar in past which led to cardiac stent. ekg done at triage.

## 2015-10-09 NOTE — ED Provider Notes (Addendum)
Pt seen and examined. Describ[place] lightheaded in church.  Now after IVF feels "great". Had diarrhea for 2 days, ended yesterday.  EKG with left bundle branch block. Most recent comparison 2001 does not show bundle. However, her cardiologist note from November of this year describes prior left bundle-branch block. She states this does not in any way remind her of her previous cardiac difficulties. She is ambulatory here in the emergency room without symptoms. [place]. troponin planned. If negative would be acceptable for discharge.    Tanna Furry, MD 10/09/15 1431  Tanna Furry, MD 10/19/15 [phone]

## 2015-10-09 NOTE — ED Provider Notes (Signed)
CSN: [id number]     Arrival date & time 10/09/15  1031 History   First MD Initiated Contact with Patient 10/09/15 1055     Chief Complaint  Patient presents with  . Dizziness  . Nausea     (Consider location/radiation/quality/duration/timing/severity/associated sxs/prior Treatment)  Patient is a 81 y.o. female presenting with dizziness.  Dizziness Quality:  Lightheadedness Severity:  Mild Onset quality:  Sudden Progression:  Improving Chronicity:  New Risk factors: heart disease and multiple medications   Risk factors: no hx of stroke and no new medications      Cindy [name] is a 81 y.o. female with PMH significant for HTN, CAD s/p MI age 34, HLD, GERD, COPD, IDA, renal insufficiency, asthma who presents with generalized weakness.  Patient reports that around 9 AM this morning she felt "a hunger pain" (as she points to to her epigastric region) and then felt nauseated.  She then went to church, and throughout the service she began to feel generally weak and lightheaded.  She states that she felt like she was going to pass out; however, she never syncopized or had LOC.  She denies slurred speech, facial droop, unilateral weakness, gait abnormalities, vertigo, CP, SOB, bloody stools, fever, chills, dizziness, hematuria, or dysuria.  Denies recent medication changes.  She reports she recently finished a course of amoxicillin, and now 2 day history of diarrhea.  She reports she only had eaten half a banana this morning when her symptoms began.   Past Medical History  Diagnosis Date  . Bronchitis   . Hypertension   . Thyroid disease   . Coronary artery disease     MI age 26  . Hypercholesteremia   . GERD (gastroesophageal reflux disease)   . COPD (chronic obstructive pulmonary disease) ([place])   . Iron deficiency anemia     used to see hem in [place], [place].   [name] Renal insufficiency   . Asthma   . Heart attack ([place])   . Skin cancer    Past Surgical History  Procedure Laterality  Date  . Coronary artery bypass graft  09/1979  . Coronary angioplasty with stent placement    . Moh's surgeries      for squamous cell carcinoma  . Partial hysterectomy  10/1978  . Appendectomy     Family History  Problem Relation Age of Onset  . Melanoma Sister     x2  . Heart disease Father   . Heart disease Mother    Social History  Substance Use Topics  . Smoking status: Former Smoker -- 1.50 packs/day for 22 years    Types: Cigarettes    Quit date: 10/23/1979  . Smokeless tobacco: Never Used  . Alcohol Use: Yes     Comment: occasional   OB History    No data available     Review of Systems  Neurological: Positive for dizziness.   All other systems negative unless otherwise stated in HPI    Allergies  Codeine  Home Medications   Prior to Admission medications   Medication Sig Start Date End Date Taking? Authorizing Provider  amitriptyline (ELAVIL) 25 MG tablet Take 25 mg by mouth at bedtime.    Yes Historical Provider, MD  aspirin 81 MG tablet Take 81 mg by mouth daily.    Yes Historical Provider, MD  atorvastatin (LIPITOR) 40 MG tablet Take 40 mg by mouth daily at 6 PM.  07/08/15  Yes Historical Provider, MD  benzonatate (TESSALON) 100 MG capsule Take  100 mg by mouth 3 (three) times daily.   Yes Historical Provider, MD  Calcium Carbonate-Vitamin D (CALCIUM 600+D) 600-200 MG-UNIT TABS Take 1 tablet by mouth 2 (two) times daily.   Yes Historical Provider, MD  Cetirizine HCl (ZYRTEC ALLERGY PO) Take 10 mg by mouth daily.   Yes Historical Provider, MD  ferrous sulfate 325 (65 FE) MG tablet Take 325 mg by mouth daily with breakfast.   Yes Historical Provider, MD  isosorbide mononitrate (IMDUR) 60 MG 24 hr tablet Take 1 tablet (60 mg total) by mouth daily. 12/10/13  Yes Jerline Pain, MD  levothyroxine (SYNTHROID, LEVOTHROID) 150 MCG tablet Take 150 mcg by mouth daily.     Yes Historical Provider, MD  losartan (COZAAR) 100 MG tablet Take 1 tablet (100 mg total) by mouth  daily. 03/25/15  Yes Jerline Pain, MD  metoprolol (TOPROL-XL) 50 MG 24 hr tablet Take 50 mg by mouth daily.     Yes Historical Provider, MD  Multiple Vitamins-Minerals (PRESERVISION AREDS 2 PO) Take 1 capsule by mouth 2 (two) times daily.   Yes Historical Provider, MD  nitroGLYCERIN (NITROSTAT) 0.4 MG SL tablet Place 1 tablet (0.4 mg total) under the tongue every 5 (five) minutes as needed for chest pain. 07/22/14  Yes Jerline Pain, MD  pantoprazole (PROTONIX) 40 MG tablet Take 1 tablet (40 mg total) by mouth 2 (two) times daily. 06/16/15  Yes Rigoberto Noel, MD  spironolactone-hydrochlorothiazide (ALDACTAZIDE) 25-25 MG per tablet Take 1 tablet by mouth daily.     Yes Historical Provider, MD  traMADol (ULTRAM) 50 MG tablet Take 50 mg by mouth 2 (two) times daily. For pain. Maximum dose= 8 tablets per day   Yes Historical Provider, MD   BP 136/49 mmHg  Pulse 64  Temp(Src) 97.5 F (36.4 C) (Oral)  Resp 20  SpO2 95% Physical Exam  Constitutional: She is oriented to person, place, and time. She appears well-developed and well-nourished.  Non-toxic appearance. She does not have a sickly appearance. She does not appear ill. No distress.  HENT:  Head: Normocephalic and atraumatic.  Mouth/Throat: Oropharynx is clear and moist.  Eyes: Conjunctivae are normal. Pupils are equal, round, and reactive to light.  Neck: Normal range of motion. Neck supple.  Cardiovascular: Normal rate, regular rhythm, normal heart sounds and intact distal pulses.   No murmur heard. Pulmonary/Chest: Effort normal and breath sounds normal. No accessory muscle usage or stridor. No respiratory distress. She has no wheezes. She has no rhonchi. She has no rales.  Abdominal: Soft. Bowel sounds are normal. She exhibits no distension. There is no tenderness. There is no rebound and no guarding.  Musculoskeletal: Normal range of motion.  Lymphadenopathy:    She has no cervical adenopathy.  Neurological: She is alert and oriented to  person, place, and time.  Mental Status:   AOx3.  Speech clear without dysarthria. Cranial Nerves:  I-not tested  II-PERRLA  III, IV, VI-EOMs intact  V-temporal and masseter strength intact  VII-symmetrical facial movements intact, no facial droop  VIII-hearing grossly intact bilaterally  IX, X-gag intact  XI-strength of sternomastoid and trapezius muscles 5/5  XII-tongue midline Motor:   Good muscle bulk and tone  Strength 5/5 bilaterally in upper and lower extremities   Cerebellar--RAMs, finger to nose intact without dysmetria  No pronator drift Sensory:  Intact in upper and lower extremities   Skin: Skin is warm and dry.  Psychiatric: She has a normal mood and affect. Her behavior is  normal.    ED Course  Procedures (including critical care time) Labs Review Labs Reviewed  CBC WITH DIFFERENTIAL/PLATELET - Abnormal; Notable for the following:    RBC 3.49 (*)    Hemoglobin 10.7 (*)    HCT 32.7 (*)    Platelets 148 (*)    All other components within normal limits  COMPREHENSIVE METABOLIC PANEL - Abnormal; Notable for the following:    Glucose, Bld 108 (*)    BUN 28 (*)    Creatinine, Ser 1.48 (*)    ALT 12 (*)    GFR calc non Af Amer 32 (*)    GFR calc Af Amer 37 (*)    All other components within normal limits  URINALYSIS, ROUTINE W REFLEX MICROSCOPIC (NOT AT [facility]) - Abnormal; Notable for the following:    Leukocytes, UA SMALL (*)    All other components within normal limits  URINE MICROSCOPIC-ADD ON - Abnormal; Notable for the following:    Squamous Epithelial / LPF 0-5 (*)    Bacteria, UA RARE (*)    Casts HYALINE CASTS (*)    All other components within normal limits  C DIFFICILE QUICK SCREEN W PCR REFLEX  LIPASE, BLOOD  POCT CBG (FASTING - GLUCOSE)-MANUAL ENTRY  I-STAT TROPOININ, ED  I-STAT TROPOININ, ED    Imaging Review Dg Chest 2 View  10/09/2015  CLINICAL DATA:  Dizziness and nausea EXAM: CHEST - 2 VIEW COMPARISON:  07/19/2014 FINDINGS: Cardiac  shadow is within normal limits. The lungs are well aerated bilaterally with mild interstitial changes. No focal infiltrate or sizable effusion is seen. IMPRESSION: No acute abnormality noted. Electronically Signed   By: Inez Catalina M.D.   On: 10/09/2015 13:04   I have personally reviewed and evaluated these images and lab results as part of my medical decision-making.   EKG Interpretation   Date/Time:  Sunday October 09 2015 10:37:04 EST Ventricular Rate:  59 PR Interval:  222 QRS Duration: 150 QT Interval:  478 QTC Calculation: 473 R Axis:   46 Text Interpretation:  Sinus bradycardia with 1st degree A-V block Left  bundle branch block Abnormal ECG Confirmed by Jeneen Rinks  MD, [place] Neighbors ([id number]) on  10/09/2015 2:24:59 PM      MDM   Final diagnoses:  Lightheadedness  Nausea    Patient presents with feeling lightheaded with nausea and generalized weakness that began this morning about 9 AM.  No facial droop, slurred speech, gait abnormality, vertigo, CP, SOB, or fever.  Blood pressure 106/49, pulse 58, temperature 97.5 F (36.4 C), temperature source Oral, resp. rate 18, SpO2 94 %.  On exam, heart RRR, lungs CTAB, abdomen soft and benign.  Neuro exam unremarkable without focal findings.  DDx includes cardiac, infectious, hypoglycemia, electrolyte.  Doubt CVA/TIA.  Will obtain labs, EKG, CXR.  Patient much improved after receiving fluids.  Will delta troponin at 3 PM, and anticipate discharge.  EKG shows sinus bradycardia with 1st degree AV block, LBBB.  Troponin x 2, 0.00. CXR negative. CBC shows hgb 10.7 and WBC 5.8 CMP shows Cr 1.48, previous reference 1.1 from 2 years ago. UA negative.   Evaluation does not show pathology requring ongoing emergent intervention or admission. Pt is hemodynamically stable and mentating appropriately. Discussed findings/results and plan with patient/guardian, who agrees with plan. All questions answered. Return precautions discussed and outpatient follow up  given.   Case has been discussed with and seen by Dr. Jeneen Rinks who agrees with the above plan for discharge.  Gloriann Loan, PA-C 10/09/15 1555  Tanna Furry, MD 10/19/15 [phone]

## 2015-10-09 NOTE — Discharge Instructions (Signed)
Nausea, Adult Nausea is the feeling that you have an upset stomach or have to vomit. Nausea by itself is not likely a serious concern, but it may be an early sign of more serious medical problems. As nausea gets worse, it can lead to vomiting. If vomiting develops, there is the risk of dehydration.  CAUSES   Viral infections.  Food poisoning.  Medicines.  Pregnancy.  Motion sickness.  Migraine headaches.  Emotional distress.  Severe pain from any source.  Alcohol intoxication. HOME CARE INSTRUCTIONS  Get plenty of rest.  Ask your caregiver about specific rehydration instructions.  Eat small amounts of food and sip liquids more often.  Take all medicines as told by your caregiver. SEEK MEDICAL CARE IF:  You have not improved after 2 days, or you get worse.  You have a headache. SEEK IMMEDIATE MEDICAL CARE IF:   You have a fever.  You faint.  You keep vomiting or have blood in your vomit.  You are extremely weak or dehydrated.  You have dark or bloody stools.  You have severe chest or abdominal pain. MAKE SURE YOU:  Understand these instructions.  Will watch your condition.  Will get help right away if you are not doing well or get worse.   This information is not intended to replace advice given to you by your health care provider. Make sure you discuss any questions you have with your health care provider.   Document Released: 11/15/2004 Document Revised: 10/29/2014 Document Reviewed: 06/20/2011 Elsevier Interactive Patient Education 2016 [employer].  Near-Syncope Near-syncope (commonly known as near fainting) is sudden weakness, dizziness, or feeling like you might pass out. During an episode of near-syncope, you may also develop pale skin, have tunnel vision, or feel sick to your stomach (nauseous). Near-syncope may occur when getting up after sitting or while standing for a long time. It is caused by a sudden decrease in blood flow to the brain.  This decrease can result from various causes or triggers, most of which are not serious. However, because near-syncope can sometimes be a sign of something serious, a medical evaluation is required. The specific cause is often not determined. HOME CARE INSTRUCTIONS  Monitor your condition for any changes. The following actions may help to alleviate any discomfort you are experiencing:  Have someone stay with you until you feel stable.  Lie down right away and prop your feet up if you start feeling like you might faint. Breathe deeply and steadily. Wait until all the symptoms have passed. Most of these episodes last only a few minutes. You may feel tired for several hours.   Drink enough fluids to keep your urine clear or pale yellow.   If you are taking blood pressure or heart medicine, get up slowly when seated or lying down. Take several minutes to sit and then stand. This can reduce dizziness.  Follow up with your health care provider as directed. SEEK IMMEDIATE MEDICAL CARE IF:   You have a severe headache.   You have unusual pain in the chest, abdomen, or back.   You are bleeding from the mouth or rectum, or you have black or tarry stool.   You have an irregular or very fast heartbeat.   You have repeated fainting or have seizure-like jerking during an episode.   You faint when sitting or lying down.   You have confusion.   You have difficulty walking.   You have severe weakness.   You have vision problems.  MAKE SURE YOU:   Understand these instructions.  Will watch your condition.  Will get help right away if you are not doing well or get worse.   This information is not intended to replace advice given to you by your health care provider. Make sure you discuss any questions you have with your health care provider.   Document Released: 10/08/2005 Document Revised: 10/13/2013 Document Reviewed: 03/13/2013 Elsevier Interactive Patient Education [employer]

## 2015-11-25 DIAGNOSIS — K429 Umbilical hernia without obstruction or gangrene: Secondary | ICD-10-CM | POA: Diagnosis not present

## 2015-11-25 DIAGNOSIS — R159 Full incontinence of feces: Secondary | ICD-10-CM | POA: Diagnosis not present

## 2015-11-28 ENCOUNTER — Ambulatory Visit: Payer: Medicare Other | Admitting: Pulmonary Disease

## 2015-12-21 ENCOUNTER — Ambulatory Visit: Payer: Medicare Other | Admitting: Pulmonary Disease

## 2015-12-29 ENCOUNTER — Other Ambulatory Visit: Payer: Self-pay | Admitting: Pulmonary Disease

## 2016-02-11 ENCOUNTER — Other Ambulatory Visit: Payer: Self-pay | Admitting: Pulmonary Disease

## 2016-02-15 DIAGNOSIS — Z08 Encounter for follow-up examination after completed treatment for malignant neoplasm: Secondary | ICD-10-CM | POA: Diagnosis not present

## 2016-02-15 DIAGNOSIS — D0422 Carcinoma in situ of skin of left ear and external auricular canal: Secondary | ICD-10-CM | POA: Diagnosis not present

## 2016-02-15 DIAGNOSIS — L820 Inflamed seborrheic keratosis: Secondary | ICD-10-CM | POA: Diagnosis not present

## 2016-02-15 DIAGNOSIS — Z1283 Encounter for screening for malignant neoplasm of skin: Secondary | ICD-10-CM | POA: Diagnosis not present

## 2016-02-15 DIAGNOSIS — L570 Actinic keratosis: Secondary | ICD-10-CM | POA: Diagnosis not present

## 2016-02-15 DIAGNOSIS — Z85828 Personal history of other malignant neoplasm of skin: Secondary | ICD-10-CM | POA: Diagnosis not present

## 2016-02-15 DIAGNOSIS — X32XXXD Exposure to sunlight, subsequent encounter: Secondary | ICD-10-CM | POA: Diagnosis not present

## 2016-02-21 ENCOUNTER — Ambulatory Visit (INDEPENDENT_AMBULATORY_CARE_PROVIDER_SITE_OTHER): Payer: Medicare Other | Admitting: Cardiology

## 2016-02-21 ENCOUNTER — Encounter: Payer: Self-pay | Admitting: Cardiology

## 2016-02-21 VITALS — BP 118/70 | HR 60 | Ht 61.0 in | Wt 179.1 lb

## 2016-02-21 DIAGNOSIS — Z87891 Personal history of nicotine dependence: Secondary | ICD-10-CM

## 2016-02-21 DIAGNOSIS — I208 Other forms of angina pectoris: Secondary | ICD-10-CM

## 2016-02-21 DIAGNOSIS — E785 Hyperlipidemia, unspecified: Secondary | ICD-10-CM | POA: Diagnosis not present

## 2016-02-21 DIAGNOSIS — I2510 Atherosclerotic heart disease of native coronary artery without angina pectoris: Secondary | ICD-10-CM | POA: Diagnosis not present

## 2016-02-21 DIAGNOSIS — I252 Old myocardial infarction: Secondary | ICD-10-CM

## 2016-02-21 NOTE — Patient Instructions (Signed)
Medication Instructions:  The current medical regimen is effective;  continue present plan and medications.  Testing/Procedures: Your physician has requested that you have a lexiscan myoview. For further information please visit [email or web address]. Please follow instruction sheet, as given.  Follow-Up: Follow up in 6 months with Dr. Skains.  You will receive a letter in the mail 2 months before you are due.  Please call us when you receive this letter to schedule your follow up appointment.  If you need a refill on your cardiac medications before your next appointment, please call your pharmacy.  Thank you for choosing [facility]!!     

## 2016-02-21 NOTE — Progress Notes (Signed)
[facility]. [address], [address], [place]  [address] Phone: [phone] Fax:  [phone]  Date:  02/21/2016   ID:  Cindy [name], DOB 11/07/1931, MRN [id number]  PCP:  Harle Battiest, MD   History of Present Illness: Cindy [name] is a 82 y.o. female with coronary artery disease status post myocardial infarction at age 19, hypertension, hyperlipidemia, nonspecific interstitial lung disease, with bypass surgery in [place] in 1981 here for six-month followup.  2 previous stents were placed in 2005, kissing stents, one in 2009 and a PTCA in 2010.  Had episode of jaw pain and back pain in [place]. Took NTG. No change. Went to ER. EKG and blood work normal. Did not want to stay overnight. Been OK since.   At night will feel mild discomfort in chest (GERD). She has to sit up at times needing to sit up to eat. NTG can help. Sparingly uses this. Sometimes pain in back between shoulder blades and below. NTG would ease. Happened 2 times last week, harder than usual. Sometimes she also has right posterior back pain when she is standing up, doing ironing for instance or laundry. His been consistent over the past several years.  Lost son 22. Died. Sudden death. He was resuscitated briefly but then died in the ambulance.    Wt Readings from Last 3 Encounters:  02/21/16 179 lb 1.9 oz (81.248 kg)  09/23/15 182 lb 6.4 oz (82.736 kg)  04/01/15 185 lb (83.915 kg)     Past Medical History  Diagnosis Date  . Bronchitis   . Hypertension   . Thyroid disease   . Coronary artery disease     MI age 43  . Hypercholesteremia   . GERD (gastroesophageal reflux disease)   . COPD (chronic obstructive pulmonary disease) ([place])   . Iron deficiency anemia     used to see hem in [place], [place].   [name] Renal insufficiency   . Asthma   . Heart attack ([place])   . Skin cancer     Past Surgical History  Procedure Laterality Date  . Coronary artery bypass graft  09/1979  . Coronary angioplasty with  stent placement    . Moh's surgeries      for squamous cell carcinoma  . Partial hysterectomy  10/1978  . Appendectomy      Current Outpatient Prescriptions  Medication Sig Dispense Refill  . aspirin 81 MG tablet Take 81 mg by mouth daily.     [name] atorvastatin (LIPITOR) 40 MG tablet Take 40 mg by mouth daily at 6 PM.     . benzonatate (TESSALON) 100 MG capsule Take 100 mg by mouth 3 (three) times daily.    . Calcium Carbonate-Vitamin D (CALCIUM 600+D) 600-200 MG-UNIT TABS Take 1 tablet by mouth 2 (two) times daily.    . Cetirizine HCl (ZYRTEC ALLERGY PO) Take 10 mg by mouth daily.    . ferrous sulfate 325 (65 FE) MG tablet Take 325 mg by mouth daily with breakfast.    . isosorbide mononitrate (IMDUR) 60 MG 24 hr tablet Take 1 tablet (60 mg total) by mouth daily. 90 tablet 1  . levothyroxine (SYNTHROID, LEVOTHROID) 150 MCG tablet Take 150 mcg by mouth daily.      [name] losartan (COZAAR) 100 MG tablet Take 1 tablet (100 mg total) by mouth daily. 90 tablet 3  . metoprolol (TOPROL-XL) 50 MG 24 hr tablet Take 50 mg by mouth daily.      [name]  Multiple Vitamins-Minerals (PRESERVISION AREDS 2 PO) Take 1 capsule by mouth 2 (two) times daily.    . nitroGLYCERIN (NITROSTAT) 0.4 MG SL tablet Place 1 tablet (0.4 mg total) under the tongue every 5 (five) minutes as needed for chest pain. 25 tablet 1  . spironolactone-hydrochlorothiazide (ALDACTAZIDE) 25-25 MG per tablet Take 1 tablet by mouth daily.      . traMADol (ULTRAM) 50 MG tablet Take 50 mg by mouth 2 (two) times daily. For pain. Maximum dose= 8 tablets per day     No current facility-administered medications for this visit.    Allergies:    Allergies  Allergen Reactions  . Codeine Other (See Comments)    Headache from heavy doses of codeine only    Social History:  The patient  reports that she quit smoking about 36 years ago. Her smoking use included Cigarettes. She has a 33 pack-year smoking history. She has never used smokeless tobacco. She  reports that she drinks alcohol. She reports that she does not use illicit drugs.   ROS:  Please see the history of present illness.   Denies any fevers, chills, orthopnea, PND.    PHYSICAL EXAM: VS:  BP 118/70 mmHg  Pulse 60  Ht 5\' 1"  (1.549 m)  Wt 179 lb 1.9 oz (81.248 kg)  BMI 33.86 kg/m2 Well nourished, well developed, in no acute distress HEENT: normal, very minimal tenderness over left temporal region Neck: no JVDLeft sided bruit.  Cardiac:  normal S1, S2; RRR; soft systolic murmur Lungs:  clear to auscultation bilaterally, no wheezing, rhonchi or rales Abd: soft, nontender, no hepatomegalyOverweight Ext: no edema Skin: warm and dry Neuro: no focal abnormalities noted  EKG:  03/04/15-sinus rhythm, first-degree AV block, left bundle branch block.-Her Sinus rhythm, first degree AV block, nonspecific intraventricular conduction delay, QRS duration 146 ms. Nonspecific ST-T wave changes.  Nuclear stress test: 12/31/13-moderate inferior scar as well as apical scar. No significant ischemia.  LABS: 10/07/13- LDL 142, hemoglobin 12.5, creatinine 1.1  ASSESSMENT AND PLAN:  1. Coronary artery disease-status post bypass surgery in 1981. Subsequent stent placement. Nuclear stress test demonstrated inferior apical scar. No ischemia. Continue with medical management. 2. Old myocardial infarction-age 47. Continue with aggressive secondary prevention. 3. History of tobacco use-quit smoking several years ago. 4. Angina- she's been experiencing more chest discomfort recently, sitting up at night, relieved with nitroglycerin. Sometimes radiates up the center of her chest to her jaw. She has experienced GERD in the past as well as back pain. She has a small hiatal hernia. It is hard for her to discern the difference between the 2. Last stress test showed inferior scar but no ischemia. I think it would be reasonable given her recent increase in symptoms to repeat stress test.  5. Hyperlipidemia-LDL  goal is 70. Back on Crestor.  LDL. Excellent. 6. Obesity-encourage weight loss 7. GERD-continue with PPI Likely her prior symptoms at night. 8. First degree AVB - stable. Left bundle branch block. No syncope. 9. COPD/ILD-per primary team. Stable. Reviewed pulmonology note. 10. Six-month follow-up   Signed, Candee Furbish, MD [facility]  02/21/2016 3:15 PM

## 2016-02-22 ENCOUNTER — Telehealth (HOSPITAL_COMMUNITY): Payer: Self-pay | Admitting: *Deleted

## 2016-02-22 NOTE — Telephone Encounter (Signed)
Patient given detailed instructions per Myocardial Perfusion Study Information Sheet for the test on 02/28/16 Patient notified to arrive 15 minutes early and that it is imperative to arrive on time for appointment to keep from having the test rescheduled.  If you need to cancel or reschedule your appointment, please call the office within 24 hours of your appointment. Failure to do so may result in a cancellation of your appointment, and a $50 no show fee. Patient verbalized understanding. Nguyet Mercer J Annalisse Minkoff, RN  

## 2016-02-28 ENCOUNTER — Ambulatory Visit (HOSPITAL_COMMUNITY): Payer: Medicare Other | Attending: Cardiology

## 2016-02-28 DIAGNOSIS — R079 Chest pain, unspecified: Secondary | ICD-10-CM | POA: Insufficient documentation

## 2016-02-28 DIAGNOSIS — I2510 Atherosclerotic heart disease of native coronary artery without angina pectoris: Secondary | ICD-10-CM | POA: Diagnosis not present

## 2016-02-28 DIAGNOSIS — I10 Essential (primary) hypertension: Secondary | ICD-10-CM | POA: Diagnosis not present

## 2016-02-28 LAB — MYOCARDIAL PERFUSION IMAGING
CHL CUP RESTING HR STRESS: 69 {beats}/min
LHR: 0.32
LV dias vol: 84 mL (ref 46–106)
LVSYSVOL: 32 mL
Peak HR: 88 {beats}/min
SDS: 5
SRS: 6
SSS: 9
TID: 0.84

## 2016-02-28 MED ORDER — TECHNETIUM TC 99M SESTAMIBI GENERIC - CARDIOLITE
10.4000 | Freq: Once | INTRAVENOUS | Status: AC | PRN
  Administered 2016-02-28: 10 via INTRAVENOUS

## 2016-02-28 MED ORDER — TECHNETIUM TC 99M SESTAMIBI GENERIC - CARDIOLITE
31.3000 | Freq: Once | INTRAVENOUS | Status: AC | PRN
  Administered 2016-02-28: 31.3 via INTRAVENOUS

## 2016-02-28 MED ORDER — REGADENOSON 0.4 MG/5ML IV SOLN
0.4000 mg | Freq: Once | INTRAVENOUS | Status: AC
  Administered 2016-02-28: 0.4 mg via INTRAVENOUS

## 2016-02-28 MED ORDER — AMINOPHYLLINE 25 MG/ML IV SOLN
75.0000 mg | Freq: Once | INTRAVENOUS | Status: AC
  Administered 2016-02-28: 75 mg via INTRAVENOUS

## 2016-02-28 MED ORDER — AMINOPHYLLINE 25 MG/ML IV SOLN
150.0000 mg | Freq: Once | INTRAVENOUS | Status: AC
  Administered 2016-02-28: 150 mg via INTRAVENOUS

## 2016-02-29 ENCOUNTER — Encounter (HOSPITAL_COMMUNITY): Payer: Medicare Other

## 2016-03-21 ENCOUNTER — Telehealth: Payer: Self-pay | Admitting: Cardiology

## 2016-03-21 NOTE — Telephone Encounter (Signed)
I returned call to pt. She is requesting Dr. Marlou Porch call her.

## 2016-03-21 NOTE — Telephone Encounter (Signed)
Discussed with her stress test. Inferior infarct pattern. No ischemia. Overall reassuring study with no change from prior.  Called her on the phone personally to discuss.  Candee Furbish, MD

## 2016-03-21 NOTE — Telephone Encounter (Signed)
New message   Pt wants to speak to Dr.Skains about a stress test results

## 2016-03-27 ENCOUNTER — Ambulatory Visit (INDEPENDENT_AMBULATORY_CARE_PROVIDER_SITE_OTHER): Payer: Medicare Other | Admitting: Pulmonary Disease

## 2016-03-27 ENCOUNTER — Encounter: Payer: Self-pay | Admitting: Pulmonary Disease

## 2016-03-27 VITALS — BP 144/76 | HR 50 | Ht 61.5 in | Wt 184.2 lb

## 2016-03-27 DIAGNOSIS — J849 Interstitial pulmonary disease, unspecified: Secondary | ICD-10-CM

## 2016-03-27 DIAGNOSIS — I208 Other forms of angina pectoris: Secondary | ICD-10-CM

## 2016-03-27 MED ORDER — BENZONATATE 100 MG PO CAPS: 100.0000 mg | ORAL_CAPSULE | Freq: Three times a day (TID) | ORAL | Status: DC | PRN

## 2016-03-27 NOTE — Assessment & Plan Note (Signed)
Refills on Tessalon for 3 months with 3 refills Stay on reflux medication Call us if worse

## 2016-03-27 NOTE — Progress Notes (Signed)
   Subjective:    Patient ID: Cindy [name], female    DOB: 03-Oct-1932, 81 y.o.   MRN: [id number]  HPI  PCP [facility]- [facility]   62@ year-old remote ex-smoker for FU of ILD, chronic cough  & stable RLL pulmonary nodule. She presented in 2013 with persistent wheezing and cough x 2 1/2 years.2 Had a CABG in 77, Tessalon Perles have not helped. Tussionex caused her to be drowsy & 911 had to be called.  She smoked 1.5 packs per day for 25 years before quitting in '81 at the time of her bypass.    03/27/2016  Chief Complaint  Patient presents with  . Follow-up    breathing has been doing okay, coughing a lot, sometimes productive.  a lot of mucus build up in throat and chest.  wants to switch her medications to 90 day supply instead of 60.    Her cough persists-this occasionally flares up and seems to respond to [employer] Her breathing has been stable She has pedal edema and is on a diuretic, she is concerned about her renal function Her sister who is a smoker was diagnosed with lung cancer and underwent surgery  Chest x-ray from 09/2015 was reviewed which does not show any increase in scarring  Significant tests/ events  HRCT 03/2012 ILD pattern is nonspecific, and could represent either early usual interstitial pneumonia (UIP), or nonspecific interstitial pneumonia (NSIP). 45mm RLL nodule  PFTs 03/2012 no airway obstruction, preserved lung volumes, isolated decrease in DLCO to 56% corrects for [place].   Spirometry 04/2013 - preserved lung function w/ no airflow obstruction.  CT chest 06/2013 Unchanged nodules & scarring  CT chest 07/19/14 -mild subpleural fibroiss unchanged from 03/2012. Scattered pulmonary nodules are unchanged (considered benign with 2 yr serial follow up ) .     Review of Systems Patient denies significant dyspnea,cough, hemoptysis,  chest pain, palpitations, pedal edema, orthopnea, paroxysmal nocturnal dyspnea, lightheadedness, nausea, vomiting, abdominal or   leg pains      Objective:   Physical Exam  Gen. Pleasant, well-nourished, in no distress ENT - no lesions, no post nasal drip Neck: No JVD, no thyromegaly, no carotid bruits Lungs: no use of accessory muscles, no dullness to percussion,bibasal scattered rales, no rhonchi  Cardiovascular: Rhythm regular, heart sounds  normal, no murmurs or gallops, 1+ peripheral edema Musculoskeletal: No deformities, no cyanosis or clubbing        Assessment & Plan:

## 2016-03-27 NOTE — Assessment & Plan Note (Signed)
Appears stable by imaging and history

## 2016-03-27 NOTE — Patient Instructions (Signed)
Refills on Tessalon for 3 months with 3 refills Stay on reflux medication Call us if worse

## 2016-03-28 ENCOUNTER — Telehealth: Payer: Self-pay | Admitting: Pulmonary Disease

## 2016-03-28 MED ORDER — PANTOPRAZOLE SODIUM 40 MG PO TBEC: 40.0000 mg | DELAYED_RELEASE_TABLET | Freq: Two times a day (BID) | ORAL | Status: DC

## 2016-03-28 NOTE — Telephone Encounter (Signed)
Rx for 90 days supply of protonix was sent  Pt aware and nothing further needed

## 2016-04-09 DIAGNOSIS — M25519 Pain in unspecified shoulder: Secondary | ICD-10-CM | POA: Diagnosis not present

## 2016-04-09 DIAGNOSIS — M1990 Unspecified osteoarthritis, unspecified site: Secondary | ICD-10-CM | POA: Diagnosis not present

## 2016-04-09 DIAGNOSIS — J3489 Other specified disorders of nose and nasal sinuses: Secondary | ICD-10-CM | POA: Diagnosis not present

## 2016-05-02 DIAGNOSIS — M25511 Pain in right shoulder: Secondary | ICD-10-CM | POA: Diagnosis not present

## 2016-05-08 DIAGNOSIS — M25511 Pain in right shoulder: Secondary | ICD-10-CM | POA: Diagnosis not present

## 2016-05-08 DIAGNOSIS — M25611 Stiffness of right shoulder, not elsewhere classified: Secondary | ICD-10-CM | POA: Diagnosis not present

## 2016-05-09 DIAGNOSIS — M25511 Pain in right shoulder: Secondary | ICD-10-CM | POA: Diagnosis not present

## 2016-05-09 DIAGNOSIS — M25611 Stiffness of right shoulder, not elsewhere classified: Secondary | ICD-10-CM | POA: Diagnosis not present

## 2016-05-14 DIAGNOSIS — M25511 Pain in right shoulder: Secondary | ICD-10-CM | POA: Diagnosis not present

## 2016-05-14 DIAGNOSIS — M25611 Stiffness of right shoulder, not elsewhere classified: Secondary | ICD-10-CM | POA: Diagnosis not present

## 2016-05-17 DIAGNOSIS — M25511 Pain in right shoulder: Secondary | ICD-10-CM | POA: Diagnosis not present

## 2016-05-17 DIAGNOSIS — M25611 Stiffness of right shoulder, not elsewhere classified: Secondary | ICD-10-CM | POA: Diagnosis not present

## 2016-06-13 DIAGNOSIS — M19011 Primary osteoarthritis, right shoulder: Secondary | ICD-10-CM | POA: Diagnosis not present

## 2016-06-19 DIAGNOSIS — M25611 Stiffness of right shoulder, not elsewhere classified: Secondary | ICD-10-CM | POA: Diagnosis not present

## 2016-06-19 DIAGNOSIS — M25511 Pain in right shoulder: Secondary | ICD-10-CM | POA: Diagnosis not present

## 2016-06-27 DIAGNOSIS — M25511 Pain in right shoulder: Secondary | ICD-10-CM | POA: Diagnosis not present

## 2016-06-27 DIAGNOSIS — M25611 Stiffness of right shoulder, not elsewhere classified: Secondary | ICD-10-CM | POA: Diagnosis not present

## 2016-06-28 DIAGNOSIS — M25511 Pain in right shoulder: Secondary | ICD-10-CM | POA: Diagnosis not present

## 2016-06-28 DIAGNOSIS — M25611 Stiffness of right shoulder, not elsewhere classified: Secondary | ICD-10-CM | POA: Diagnosis not present

## 2016-07-02 DIAGNOSIS — M25611 Stiffness of right shoulder, not elsewhere classified: Secondary | ICD-10-CM | POA: Diagnosis not present

## 2016-07-02 DIAGNOSIS — M25511 Pain in right shoulder: Secondary | ICD-10-CM | POA: Diagnosis not present

## 2016-07-09 DIAGNOSIS — M25511 Pain in right shoulder: Secondary | ICD-10-CM | POA: Diagnosis not present

## 2016-07-09 DIAGNOSIS — M25611 Stiffness of right shoulder, not elsewhere classified: Secondary | ICD-10-CM | POA: Diagnosis not present

## 2016-07-11 DIAGNOSIS — M25511 Pain in right shoulder: Secondary | ICD-10-CM | POA: Diagnosis not present

## 2016-07-11 DIAGNOSIS — M25611 Stiffness of right shoulder, not elsewhere classified: Secondary | ICD-10-CM | POA: Diagnosis not present

## 2016-07-17 DIAGNOSIS — Z0000 Encounter for general adult medical examination without abnormal findings: Secondary | ICD-10-CM | POA: Diagnosis not present

## 2016-07-17 DIAGNOSIS — R51 Headache: Secondary | ICD-10-CM | POA: Diagnosis not present

## 2016-07-17 DIAGNOSIS — H6123 Impacted cerumen, bilateral: Secondary | ICD-10-CM | POA: Diagnosis not present

## 2016-07-17 DIAGNOSIS — E039 Hypothyroidism, unspecified: Secondary | ICD-10-CM | POA: Diagnosis not present

## 2016-07-17 DIAGNOSIS — M25511 Pain in right shoulder: Secondary | ICD-10-CM | POA: Diagnosis not present

## 2016-07-17 DIAGNOSIS — E785 Hyperlipidemia, unspecified: Secondary | ICD-10-CM | POA: Diagnosis not present

## 2016-07-17 DIAGNOSIS — I10 Essential (primary) hypertension: Secondary | ICD-10-CM | POA: Diagnosis not present

## 2016-07-17 DIAGNOSIS — M25611 Stiffness of right shoulder, not elsewhere classified: Secondary | ICD-10-CM | POA: Diagnosis not present

## 2016-07-17 DIAGNOSIS — R040 Epistaxis: Secondary | ICD-10-CM | POA: Diagnosis not present

## 2016-07-17 DIAGNOSIS — Z23 Encounter for immunization: Secondary | ICD-10-CM | POA: Diagnosis not present

## 2016-07-17 DIAGNOSIS — H6120 Impacted cerumen, unspecified ear: Secondary | ICD-10-CM | POA: Diagnosis not present

## 2016-07-18 DIAGNOSIS — M25611 Stiffness of right shoulder, not elsewhere classified: Secondary | ICD-10-CM | POA: Diagnosis not present

## 2016-07-18 DIAGNOSIS — M25511 Pain in right shoulder: Secondary | ICD-10-CM | POA: Diagnosis not present

## 2016-07-19 DIAGNOSIS — Z23 Encounter for immunization: Secondary | ICD-10-CM | POA: Diagnosis not present

## 2016-07-19 DIAGNOSIS — H6120 Impacted cerumen, unspecified ear: Secondary | ICD-10-CM | POA: Diagnosis not present

## 2016-07-19 DIAGNOSIS — I10 Essential (primary) hypertension: Secondary | ICD-10-CM | POA: Diagnosis not present

## 2016-07-19 DIAGNOSIS — R040 Epistaxis: Secondary | ICD-10-CM | POA: Diagnosis not present

## 2016-07-19 DIAGNOSIS — E039 Hypothyroidism, unspecified: Secondary | ICD-10-CM | POA: Diagnosis not present

## 2016-07-19 DIAGNOSIS — R51 Headache: Secondary | ICD-10-CM | POA: Diagnosis not present

## 2016-07-19 DIAGNOSIS — Z0000 Encounter for general adult medical examination without abnormal findings: Secondary | ICD-10-CM | POA: Diagnosis not present

## 2016-07-19 DIAGNOSIS — E785 Hyperlipidemia, unspecified: Secondary | ICD-10-CM | POA: Diagnosis not present

## 2016-07-24 DIAGNOSIS — L570 Actinic keratosis: Secondary | ICD-10-CM | POA: Diagnosis not present

## 2016-07-24 DIAGNOSIS — L723 Sebaceous cyst: Secondary | ICD-10-CM | POA: Diagnosis not present

## 2016-07-24 DIAGNOSIS — X32XXXD Exposure to sunlight, subsequent encounter: Secondary | ICD-10-CM | POA: Diagnosis not present

## 2016-07-25 DIAGNOSIS — M25511 Pain in right shoulder: Secondary | ICD-10-CM | POA: Diagnosis not present

## 2016-08-31 ENCOUNTER — Ambulatory Visit: Payer: Medicare Other | Admitting: Cardiology

## 2016-08-31 NOTE — Progress Notes (Deleted)
[facility]. [address] [place], [place]  [address] Phone: [phone] Fax:  [phone]  Date:  08/31/2016   ID:  Cindy [name], DOB 29-Jul-1932, MRN [id number]  PCP:  Melinda Crutch, MD   History of Present Illness: Cindy [name] is a 82 y.o. female with coronary artery disease status post myocardial infarction at age 17, hypertension, hyperlipidemia, nonspecific interstitial lung disease, with bypass surgery in [place] in 1981 here for six-month followup.  2 previous stents were placed in 2005, kissing stents, one in 2009 and a PTCA in 2010.  Had episode of jaw pain and back pain in [place]. Took NTG. No change. Went to ER. EKG and blood work normal. Did not want to stay overnight. Been OK since.   At night will feel mild discomfort in chest (GERD). She has to sit up at times needing to sit up to eat. NTG can help. Sparingly uses this. Sometimes pain in back between shoulder blades and below. NTG would ease. Happened 2 times last week, harder than usual. Sometimes she also has right posterior back pain when she is standing up, doing ironing for instance or laundry. His been consistent over the past several years.  Lost son 26. Died. Sudden death. He was resuscitated briefly but then died in the ambulance.    Wt Readings from Last 3 Encounters:  03/27/16 184 lb 3.2 oz (83.6 kg)  02/21/16 179 lb 1.9 oz (81.2 kg)  09/23/15 182 lb 6.4 oz (82.7 kg)     Past Medical History:  Diagnosis Date  . Asthma   . Bronchitis   . COPD (chronic obstructive pulmonary disease) ([place])   . Coronary artery disease    MI age 21  . GERD (gastroesophageal reflux disease)   . Heart attack   . Hypercholesteremia   . Hypertension   . Iron deficiency anemia    used to see hem in [place], [place].   [name] Renal insufficiency   . Skin cancer   . Thyroid disease     Past Surgical History:  Procedure Laterality Date  . APPENDECTOMY    . CORONARY ANGIOPLASTY WITH STENT PLACEMENT    .  CORONARY ARTERY BYPASS GRAFT  09/1979  . Moh's Surgeries     for squamous cell carcinoma  . PARTIAL HYSTERECTOMY  10/1978    Current Outpatient Prescriptions  Medication Sig Dispense Refill  . aspirin 81 MG tablet Take 81 mg by mouth daily.     [name] atorvastatin (LIPITOR) 40 MG tablet     . benzonatate (TESSALON) 100 MG capsule Take 1 capsule (100 mg total) by mouth 3 (three) times daily as needed for cough. 270 capsule 3  . Calcium Carbonate-Vitamin D (CALCIUM 600+D) 600-200 MG-UNIT TABS Take 1 tablet by mouth 2 (two) times daily.    . Cetirizine HCl (ZYRTEC ALLERGY PO) Take 10 mg by mouth daily.    . ferrous sulfate 325 (65 FE) MG tablet Take 325 mg by mouth daily with breakfast.    . isosorbide mononitrate (IMDUR) 60 MG 24 hr tablet Take 1 tablet (60 mg total) by mouth daily. 90 tablet 1  . levothyroxine (SYNTHROID, LEVOTHROID) 150 MCG tablet Take 150 mcg by mouth daily.      [name] losartan (COZAAR) 100 MG tablet Take 1 tablet (100 mg total) by mouth daily. (Patient taking differently: Take 50 mg by mouth daily. ) 90 tablet 3  . metoprolol (TOPROL-XL) 50 MG 24 hr tablet Take 50 mg  by mouth daily.      . Multiple Vitamins-Minerals (PRESERVISION AREDS 2 PO) Take 1 capsule by mouth 2 (two) times daily.    . nitroGLYCERIN (NITROSTAT) 0.4 MG SL tablet Place 1 tablet (0.4 mg total) under the tongue every 5 (five) minutes as needed for chest pain. 25 tablet 1  . pantoprazole (PROTONIX) 40 MG tablet Take 1 tablet (40 mg total) by mouth 2 (two) times daily. 180 tablet 1  . spironolactone-hydrochlorothiazide (ALDACTAZIDE) 25-25 MG per tablet Take 1 tablet by mouth daily.      . traMADol (ULTRAM) 50 MG tablet Take 50 mg by mouth 2 (two) times daily. For pain. Maximum dose= 8 tablets per day     No current facility-administered medications for this visit.     Allergies:    Allergies  Allergen Reactions  . Codeine Other (See Comments)    Headache from heavy doses of codeine only    Social History:   The patient  reports that she quit smoking about 36 years ago. Her smoking use included Cigarettes. She has a 33.00 pack-year smoking history. She has never used smokeless tobacco. She reports that she drinks alcohol. She reports that she does not use drugs.   ROS:  Please see the history of present illness.   Denies any fevers, chills, orthopnea, PND.    PHYSICAL EXAM: VS:  There were no vitals taken for this visit. Well nourished, well developed, in no acute distress HEENT: normal, very minimal tenderness over left temporal region Neck: no JVDLeft sided bruit.  Cardiac:  normal S1, S2; RRR; soft systolic murmur Lungs:  clear to auscultation bilaterally, no wheezing, rhonchi or rales Abd: soft, nontender, no hepatomegalyOverweight Ext: no edema Skin: warm and dry Neuro: no focal abnormalities noted  EKG:  03/04/15-sinus rhythm, first-degree AV block, left bundle branch block.-Her Sinus rhythm, first degree AV block, nonspecific intraventricular conduction delay, QRS duration 146 ms. Nonspecific ST-T wave changes.  Nuclear stress test: 12/31/13 and 02/2016-moderate inferior scar as well as apical scar. No significant ischemia.  LABS: 10/07/13- LDL 142, hemoglobin 12.5, creatinine 1.1  ASSESSMENT AND PLAN:  1. Coronary artery disease-status post bypass surgery in 1981. Subsequent stent placement. Nuclear stress test demonstrated inferior apical scar. No ischemia. Continue with medical management. 2. Old myocardial infarction-age 27. Continue with aggressive secondary prevention. 3. History of tobacco use-quit smoking several years ago. 4. Angina- she's been experiencing more chest discomfort recently, sitting up at night, relieved with nitroglycerin. Sometimes radiates up the center of her chest to her jaw. She has experienced GERD in the past as well as back pain. She has a small hiatal hernia. It is hard for her to discern the difference between the two. Two prior stress tests showed  inferior scar but no ischemia.  5. Hyperlipidemia-LDL goal is 70. Back on Crestor.  LDL. Excellent. 6. Obesity-encourage weight loss 7. GERD-continue with PPI Likely her prior symptoms at night. 8. First degree AVB - stable. Left bundle branch block. No syncope. 9. COPD/ILD-per primary team. Stable. Reviewed pulmonology note. 10. Six-month follow-up   Signed, Candee Furbish, MD [facility]  08/31/2016 6:45 AM

## 2016-09-03 DIAGNOSIS — R05 Cough: Secondary | ICD-10-CM | POA: Diagnosis not present

## 2016-09-22 ENCOUNTER — Other Ambulatory Visit: Payer: Self-pay | Admitting: Pulmonary Disease

## 2016-09-26 ENCOUNTER — Ambulatory Visit: Payer: Medicare Other | Admitting: Adult Health

## 2016-10-17 ENCOUNTER — Other Ambulatory Visit: Payer: Self-pay | Admitting: Family Medicine

## 2016-10-17 DIAGNOSIS — Z1231 Encounter for screening mammogram for malignant neoplasm of breast: Secondary | ICD-10-CM

## 2016-11-20 ENCOUNTER — Telehealth: Payer: Self-pay | Admitting: Cardiology

## 2016-11-20 NOTE — Telephone Encounter (Signed)
Agree, sounds musculoskeletal. Candee Furbish, MD

## 2016-11-20 NOTE — Telephone Encounter (Signed)
Spoke with pt who is reporting a few weeks ago she had a throbbing pain in her elbow down to her wrist.  She rubbed it with BenGay and had almost immediate relief.  Today she has had the same thing occur.  She play games on her iPad a lot - holding it with her left hand/arm.  She did this today and then peeled potatoes and it occurred.  It has resolved mostly on its own today.  She denies any SOB, CP, diaphoresis or increased fatigue.  She reports the only time she takes SL Nitro is when she feels like she has indigestion and it reliefs it.  She has scheduled an appt for f/u.  She will c/b if further s/s.  Aware I will review this information with Dr Marlou Porch and c/b if further instructions.

## 2016-11-20 NOTE — Telephone Encounter (Signed)
Cindy [name] is calling because she has a pain that it going down to her elbow down . Please call

## 2016-11-26 ENCOUNTER — Ambulatory Visit
Admission: RE | Admit: 2016-11-26 | Discharge: 2016-11-26 | Disposition: A | Discharge status: Home / Self Care | Payer: Medicare Other | Source: Ambulatory Visit | Attending: Family Medicine | Admitting: Family Medicine

## 2016-11-26 DIAGNOSIS — Z1231 Encounter for screening mammogram for malignant neoplasm of breast: Secondary | ICD-10-CM

## 2016-12-11 ENCOUNTER — Ambulatory Visit (INDEPENDENT_AMBULATORY_CARE_PROVIDER_SITE_OTHER): Payer: Medicare Other | Admitting: Cardiology

## 2016-12-11 ENCOUNTER — Encounter: Payer: Self-pay | Admitting: Cardiology

## 2016-12-11 VITALS — BP 124/86 | HR 56 | Ht 61.5 in | Wt 188.2 lb

## 2016-12-11 DIAGNOSIS — I2510 Atherosclerotic heart disease of native coronary artery without angina pectoris: Secondary | ICD-10-CM

## 2016-12-11 DIAGNOSIS — I252 Old myocardial infarction: Secondary | ICD-10-CM

## 2016-12-11 DIAGNOSIS — E7800 Pure hypercholesterolemia, unspecified: Secondary | ICD-10-CM | POA: Diagnosis not present

## 2016-12-11 DIAGNOSIS — J849 Interstitial pulmonary disease, unspecified: Secondary | ICD-10-CM | POA: Diagnosis not present

## 2016-12-11 NOTE — Patient Instructions (Addendum)
Medication Instructions:  Please hold your atorvastatin for 1 month to see if this helps your leg pain (3/21).  Call us back to let us know. [phone]. Continue all other medications as listed.  Follow-Up: Follow up in 6 months with Dr. Marlou Porch.  You will receive a letter in the mail 2 months before you are due.  Please call us when you receive this letter to schedule your follow up appointment.  If you need a refill on your cardiac medications before your next appointment, please call your pharmacy.  Thank you for choosing [facility]!!

## 2016-12-11 NOTE — Progress Notes (Signed)
[facility]. [address] [place], [place]  [address] Phone: [phone] Fax:  [phone]  Date:  12/11/2016   ID:  Cindy [name], DOB November 20, 1931, MRN [id number]  PCP:  Melinda Crutch, MD   History of Present Illness: Cindy [name] is a 83 y.o. female with coronary artery disease status post myocardial infarction at age 79, hypertension, hyperlipidemia, nonspecific interstitial lung disease, with bypass surgery in [place] in 1981 here for six-month followup.  2 previous stents were placed in 2005, kissing stents, one in 2009 and a PTCA in 2010.  Overall she's been worried about some leg weakness bilaterally. No claudication/burning. We are giving her statin holiday. No chest pain, no shortness of breath, no syncope  Left bundle branch block has been chronic. Nuclear stress test in 2017 showed no ischemia.  Lost son 35. Died. Sudden death. He was resuscitated briefly but then died in the ambulance.     Wt Readings from Last 3 Encounters:  12/11/16 188 lb 3.2 oz (85.4 kg)  03/27/16 184 lb 3.2 oz (83.6 kg)  02/21/16 179 lb 1.9 oz (81.2 kg)     Past Medical History:  Diagnosis Date  . Asthma   . Bronchitis   . COPD (chronic obstructive pulmonary disease) ([place])   . Coronary artery disease    MI age 18  . GERD (gastroesophageal reflux disease)   . Heart attack   . Hypercholesteremia   . Hypertension   . Iron deficiency anemia    used to see hem in [place], [place].   [name] Renal insufficiency   . Thyroid disease     Past Surgical History:  Procedure Laterality Date  . APPENDECTOMY    . BREAST BIOPSY Right    benign  . CORONARY ANGIOPLASTY WITH STENT PLACEMENT    . CORONARY ARTERY BYPASS GRAFT  09/1979  . Moh's Surgeries     for squamous cell carcinoma  . PARTIAL HYSTERECTOMY  10/1978    Current Outpatient Prescriptions  Medication Sig Dispense Refill  . aspirin 81 MG tablet Take 81 mg by mouth daily.     [name] atorvastatin (LIPITOR) 40 MG tablet     .  benzonatate (TESSALON) 100 MG capsule Take 1 capsule (100 mg total) by mouth 3 (three) times daily as needed for cough. 270 capsule 3  . Calcium Carbonate-Vitamin D (CALCIUM 600+D) 600-200 MG-UNIT TABS Take 1 tablet by mouth 2 (two) times daily.    . Cetirizine HCl (ZYRTEC ALLERGY PO) Take 10 mg by mouth daily.    . ferrous sulfate 325 (65 FE) MG tablet Take 325 mg by mouth daily with breakfast.    . isosorbide mononitrate (IMDUR) 60 MG 24 hr tablet Take 1 tablet (60 mg total) by mouth daily. 90 tablet 1  . levothyroxine (SYNTHROID, LEVOTHROID) 150 MCG tablet Take 150 mcg by mouth daily.      [name] losartan (COZAAR) 50 MG tablet Take 50 mg by mouth daily.    . metoprolol (TOPROL-XL) 50 MG 24 hr tablet Take 50 mg by mouth daily.      . Multiple Vitamins-Minerals (PRESERVISION AREDS 2 PO) Take 1 capsule by mouth 2 (two) times daily.    . nitroGLYCERIN (NITROSTAT) 0.4 MG SL tablet Place 1 tablet (0.4 mg total) under the tongue every 5 (five) minutes as needed for chest pain. 25 tablet 1  . pantoprazole (PROTONIX) 40 MG tablet TAKE ONE TABLET BY MOUTH TWICE DAILY 180 tablet 1  . spironolactone-hydrochlorothiazide (  ALDACTAZIDE) 25-25 MG per tablet Take 1 tablet by mouth daily.      . traMADol (ULTRAM) 50 MG tablet Take 50 mg by mouth 2 (two) times daily. For pain. Maximum dose= 8 tablets per day     No current facility-administered medications for this visit.     Allergies:    Allergies  Allergen Reactions  . Codeine Other (See Comments)    Headache from heavy doses of codeine only    Social History:  The patient  reports that she quit smoking about 37 years ago. Her smoking use included Cigarettes. She has a 33.00 pack-year smoking history. She has never used smokeless tobacco. She reports that she drinks alcohol. She reports that she does not use drugs.   ROS:  Please see the history of present illness.   Denies any fevers, chills, orthopnea, PND.    PHYSICAL EXAM: VS:  BP 124/86   Pulse (!)  56   Ht 5' 1.5" (1.562 m)   Wt 188 lb 3.2 oz (85.4 kg)   LMP  (LMP Unknown)   BMI 34.98 kg/m  Well nourished, well developed, in no acute distress HEENT: normal, very minimal tenderness over left temporal region Neck: no JVDLeft sided bruit.  Cardiac:  normal S1, S2; RRR; soft systolic murmur Lungs:  Mild Velcro-like crackles bases bilaterally Abd: soft, nontender, no hepatomegalyOverweight Ext: no edema Skin: warm and dry Neuro: no focal abnormalities noted  EKG:  EKG today 12/11/16-sinus bradycardia rate 56 with nonspecific interventricular conduction delay, left bundle branch block like appearance personally viewed 03/04/15-sinus rhythm, first-degree AV block, left bundle branch block.-Her Sinus rhythm, first degree AV block, nonspecific intraventricular conduction delay, QRS duration 146 ms. Nonspecific ST-T wave changes.  Nuclear stress test: 12/31/13-moderate inferior scar as well as apical scar. No significant ischemia.   02/28/16:Nuclear stress EF: 62%.  The study is normal.  This is a low risk study.  The left ventricular ejection fraction is normal (55-65%). Paradoxical septal motion consistent with LBBB.      LABS: 10/07/13- LDL 142, hemoglobin 12.5, creatinine 1.1  ASSESSMENT AND PLAN:  1. Coronary artery disease-status post bypass surgery in 1981. Subsequent stent placement. Nuclear stress test demonstrated inferior apical scar. No ischemia. Continue with medical management. 2. Old myocardial infarction-age 50. Continue with aggressive secondary prevention. 3. History of tobacco use-quit smoking several years ago. 4. Hyperlipidemia-LDL goal is 70. Back on atorvastatin.  LDL. Excellent. She has been having some leg weakness she states or she just has to stop, she continues she feels as if she is staggering. I will give her a statin holiday for the next month and she will call me up to let me know if any changes occurred. We will likely restart the statin at 20  mg. 5. Obesity-encourage weight loss 6. GERD-continue with PPI Likely her prior symptoms at night. 7. First degree AVB - stable. Left bundle branch block. No syncope. May need a pacemaker if conduction disorder deteriorates in the future, discussed. 8. COPD/ILD-per primary team. Stable. Reviewed pulmonology note. Dr. Elsworth Soho 9. 45-month follow-up   Signed, Candee Furbish, MD [facility]  12/11/2016 3:18 PM

## 2017-01-05 ENCOUNTER — Emergency Department (HOSPITAL_COMMUNITY): Payer: Medicare Other

## 2017-01-05 ENCOUNTER — Encounter (HOSPITAL_COMMUNITY): Payer: Self-pay

## 2017-01-05 ENCOUNTER — Inpatient Hospital Stay (HOSPITAL_COMMUNITY)
Admission: EM | Admit: 2017-01-05 | Discharge: 2017-01-07 | DRG: 871 | Disposition: A | Discharge status: Home / Self Care | Payer: Medicare Other | Attending: Internal Medicine | Admitting: Internal Medicine

## 2017-01-05 DIAGNOSIS — D649 Anemia, unspecified: Secondary | ICD-10-CM | POA: Diagnosis present

## 2017-01-05 DIAGNOSIS — R918 Other nonspecific abnormal finding of lung field: Secondary | ICD-10-CM | POA: Diagnosis not present

## 2017-01-05 DIAGNOSIS — Z885 Allergy status to narcotic agent status: Secondary | ICD-10-CM

## 2017-01-05 DIAGNOSIS — J449 Chronic obstructive pulmonary disease, unspecified: Secondary | ICD-10-CM | POA: Diagnosis present

## 2017-01-05 DIAGNOSIS — E039 Hypothyroidism, unspecified: Secondary | ICD-10-CM | POA: Diagnosis not present

## 2017-01-05 DIAGNOSIS — J849 Interstitial pulmonary disease, unspecified: Secondary | ICD-10-CM | POA: Diagnosis not present

## 2017-01-05 DIAGNOSIS — R402411 Glasgow coma scale score 13-15, in the field [EMT or ambulance]: Secondary | ICD-10-CM | POA: Diagnosis not present

## 2017-01-05 DIAGNOSIS — G92 Toxic encephalopathy: Secondary | ICD-10-CM | POA: Diagnosis not present

## 2017-01-05 DIAGNOSIS — N183 Chronic kidney disease, stage 3 (moderate): Secondary | ICD-10-CM | POA: Diagnosis present

## 2017-01-05 DIAGNOSIS — N390 Urinary tract infection, site not specified: Secondary | ICD-10-CM | POA: Diagnosis present

## 2017-01-05 DIAGNOSIS — Z79899 Other long term (current) drug therapy: Secondary | ICD-10-CM

## 2017-01-05 DIAGNOSIS — K219 Gastro-esophageal reflux disease without esophagitis: Secondary | ICD-10-CM | POA: Diagnosis present

## 2017-01-05 DIAGNOSIS — I2510 Atherosclerotic heart disease of native coronary artery without angina pectoris: Secondary | ICD-10-CM | POA: Diagnosis present

## 2017-01-05 DIAGNOSIS — I129 Hypertensive chronic kidney disease with stage 1 through stage 4 chronic kidney disease, or unspecified chronic kidney disease: Secondary | ICD-10-CM | POA: Diagnosis present

## 2017-01-05 DIAGNOSIS — R109 Unspecified abdominal pain: Secondary | ICD-10-CM | POA: Diagnosis not present

## 2017-01-05 DIAGNOSIS — Z96653 Presence of artificial knee joint, bilateral: Secondary | ICD-10-CM | POA: Diagnosis present

## 2017-01-05 DIAGNOSIS — R4182 Altered mental status, unspecified: Secondary | ICD-10-CM

## 2017-01-05 DIAGNOSIS — G9340 Encephalopathy, unspecified: Secondary | ICD-10-CM | POA: Diagnosis not present

## 2017-01-05 DIAGNOSIS — Z90711 Acquired absence of uterus with remaining cervical stump: Secondary | ICD-10-CM

## 2017-01-05 DIAGNOSIS — D696 Thrombocytopenia, unspecified: Secondary | ICD-10-CM | POA: Diagnosis not present

## 2017-01-05 DIAGNOSIS — A419 Sepsis, unspecified organism: Secondary | ICD-10-CM | POA: Diagnosis not present

## 2017-01-05 DIAGNOSIS — Z886 Allergy status to analgesic agent status: Secondary | ICD-10-CM

## 2017-01-05 DIAGNOSIS — I252 Old myocardial infarction: Secondary | ICD-10-CM

## 2017-01-05 DIAGNOSIS — Z87891 Personal history of nicotine dependence: Secondary | ICD-10-CM

## 2017-01-05 DIAGNOSIS — Z8249 Family history of ischemic heart disease and other diseases of the circulatory system: Secondary | ICD-10-CM

## 2017-01-05 DIAGNOSIS — Z9049 Acquired absence of other specified parts of digestive tract: Secondary | ICD-10-CM

## 2017-01-05 DIAGNOSIS — Z808 Family history of malignant neoplasm of other organs or systems: Secondary | ICD-10-CM

## 2017-01-05 DIAGNOSIS — R000 Tachycardia, unspecified: Secondary | ICD-10-CM | POA: Diagnosis present

## 2017-01-05 DIAGNOSIS — Z7982 Long term (current) use of aspirin: Secondary | ICD-10-CM

## 2017-01-05 DIAGNOSIS — Z955 Presence of coronary angioplasty implant and graft: Secondary | ICD-10-CM

## 2017-01-05 DIAGNOSIS — Z951 Presence of aortocoronary bypass graft: Secondary | ICD-10-CM

## 2017-01-05 LAB — COMPREHENSIVE METABOLIC PANEL
ALK PHOS: 49 U/L (ref 38–126)
ALT: 15 U/L (ref 14–54)
ANION GAP: 10 (ref 5–15)
AST: 24 U/L (ref 15–41)
Albumin: 3.7 g/dL (ref 3.5–5.0)
BILIRUBIN TOTAL: 0.4 mg/dL (ref 0.3–1.2)
BUN: 27 mg/dL — ABNORMAL HIGH (ref 6–20)
CALCIUM: 9.8 mg/dL (ref 8.9–10.3)
CO2: 26 mmol/L (ref 22–32)
CREATININE: 1.37 mg/dL — AB (ref 0.44–1.00)
Chloride: 101 mmol/L (ref 101–111)
GFR, EST AFRICAN AMERICAN: 40 mL/min — AB (ref >60)
GFR, EST NON AFRICAN AMERICAN: 35 mL/min — AB (ref >60)
Glucose, Bld: 114 mg/dL — ABNORMAL HIGH (ref 65–99)
Potassium: 4.4 mmol/L (ref 3.5–5.1)
SODIUM: 137 mmol/L (ref 135–145)
TOTAL PROTEIN: 6.3 g/dL — AB (ref 6.5–8.1)

## 2017-01-05 LAB — I-STAT CHEM 8, ED
BUN: 29 mg/dL — ABNORMAL HIGH (ref 6–20)
CREATININE: 1.4 mg/dL — AB (ref 0.44–1.00)
Calcium, Ion: 1.24 mmol/L (ref 1.15–1.40)
Chloride: 104 mmol/L (ref 101–111)
Glucose, Bld: 112 mg/dL — ABNORMAL HIGH (ref 65–99)
HEMATOCRIT: 32 % — AB (ref 36.0–46.0)
HEMOGLOBIN: 10.9 g/dL — AB (ref 12.0–15.0)
POTASSIUM: 4.4 mmol/L (ref 3.5–5.1)
Sodium: 140 mmol/L (ref 135–145)
TCO2: 26 mmol/L (ref 0–100)

## 2017-01-05 LAB — CBC WITH DIFFERENTIAL/PLATELET
Basophils Absolute: 0 10*3/uL (ref 0.0–0.1)
Basophils Relative: 0 %
EOS ABS: 0.2 10*3/uL (ref 0.0–0.7)
Eosinophils Relative: 3 %
HEMATOCRIT: 33 % — AB (ref 36.0–46.0)
HEMOGLOBIN: 11.3 g/dL — AB (ref 12.0–15.0)
LYMPHS ABS: 0.8 10*3/uL (ref 0.7–4.0)
LYMPHS PCT: 12 %
MCH: 32.2 pg (ref 26.0–34.0)
MCHC: 34.2 g/dL (ref 30.0–36.0)
MCV: 94 fL (ref 78.0–100.0)
MONOS PCT: 10 %
Monocytes Absolute: 0.7 10*3/uL (ref 0.1–1.0)
NEUTROS PCT: 75 %
Neutro Abs: 4.9 10*3/uL (ref 1.7–7.7)
Platelets: 136 10*3/uL — ABNORMAL LOW (ref 150–400)
RBC: 3.51 MIL/uL — AB (ref 3.87–5.11)
RDW: 12.9 % (ref 11.5–15.5)
WBC: 6.6 10*3/uL (ref 4.0–10.5)

## 2017-01-05 LAB — URINALYSIS, ROUTINE W REFLEX MICROSCOPIC
Bilirubin Urine: NEGATIVE
Glucose, UA: NEGATIVE mg/dL
Hgb urine dipstick: NEGATIVE
Ketones, ur: NEGATIVE mg/dL
Nitrite: NEGATIVE
PROTEIN: NEGATIVE mg/dL
Specific Gravity, Urine: 1.013 (ref 1.005–1.030)
pH: 7 (ref 5.0–8.0)

## 2017-01-05 LAB — I-STAT TROPONIN, ED: Troponin i, poc: 0.01 ng/mL (ref 0.00–0.08)

## 2017-01-05 LAB — I-STAT CG4 LACTIC ACID, ED: LACTIC ACID, VENOUS: 2.49 mmol/L — AB (ref 0.5–1.9)

## 2017-01-05 LAB — LACTIC ACID, PLASMA: Lactic Acid, Venous: 0.8 mmol/L (ref 0.5–1.9)

## 2017-01-05 MED ORDER — IOPAMIDOL (ISOVUE-300) INJECTION 61%: 75.0000 mL | Freq: Once | INTRAVENOUS | Status: DC | PRN

## 2017-01-05 MED ORDER — VANCOMYCIN HCL IN DEXTROSE 1-5 GM/200ML-% IV SOLN
1000.0000 mg | Freq: Once | INTRAVENOUS | Status: DC
  Filled 2017-01-05: qty 200

## 2017-01-05 MED ORDER — VANCOMYCIN HCL 10 G IV SOLR
2000.0000 mg | Freq: Once | INTRAVENOUS | Status: AC
  Administered 2017-01-05: 2000 mg via INTRAVENOUS
  Filled 2017-01-05: qty 2000

## 2017-01-05 MED ORDER — PIPERACILLIN-TAZOBACTAM 3.375 G IVPB 30 MIN
3.3750 g | Freq: Once | INTRAVENOUS | Status: AC
  Administered 2017-01-05: 3.375 g via INTRAVENOUS
  Filled 2017-01-05: qty 50

## 2017-01-05 MED ORDER — ACETAMINOPHEN 325 MG PO TABS
650.0000 mg | ORAL_TABLET | Freq: Once | ORAL | Status: AC
  Administered 2017-01-05: 650 mg via ORAL
  Filled 2017-01-05: qty 2

## 2017-01-05 MED ORDER — VANCOMYCIN HCL IN DEXTROSE 750-5 MG/150ML-% IV SOLN
750.0000 mg | Freq: Two times a day (BID) | INTRAVENOUS | Status: DC
  Administered 2017-01-06 (×2): 750 mg via INTRAVENOUS
  Filled 2017-01-05 (×3): qty 150

## 2017-01-05 MED ORDER — SODIUM CHLORIDE 0.9 % IV BOLUS (SEPSIS)
1000.0000 mL | Freq: Once | INTRAVENOUS | Status: AC
Start: 2017-01-05 — End: 2017-01-05
  Administered 2017-01-05: 1000 mL via INTRAVENOUS

## 2017-01-05 MED ORDER — SODIUM CHLORIDE 0.9 % IV SOLN: Freq: Once | INTRAVENOUS | Status: DC

## 2017-01-05 MED ORDER — PIPERACILLIN-TAZOBACTAM 3.375 G IVPB
3.3750 g | Freq: Three times a day (TID) | INTRAVENOUS | Status: DC
  Administered 2017-01-06 – 2017-01-07 (×4): 3.375 g via INTRAVENOUS
  Filled 2017-01-05 (×5): qty 50

## 2017-01-05 NOTE — H&P (Addendum)
History and Physical    Cindy [name] BTD:[id number] DOB: Feb 13, 1932 DOA: 01/05/2017  Referring MD/NP/PA: Jeannett Senior, PA-C PCP: Melinda Crutch, MD  Patient coming from: home via EMS  Chief Complaint: Disoriented  HPI: Cindy [name] is a 83 y.o. female with medical history significant of HTN, asthma/COPD, CAD, hypothyroidism, and CKD stage III; presents after being found acutely disoriented.  At baseline she has no history of dementia. History is obtained from the patient's husband and she does not recall events in question. Her husband notes that she was confused and agitated this afternoon and it progressively worsened. When he went wake her up from a nap found her shaking with chills and very disoriented. She was complaining of having to use the restroom. He assisted her to the restroom, but would not get off the toilet seat and did not appear well. Therefore he called EMS. Associated symptoms include a chronic cough (present for at least one year), subjective fever, poor oral intake of fluids, and urinary urgency. denies any complaints of chest pain, diarrhea, focal weakness, slurred speech, nausea, or vomiting.   ED Course: Upon admission to the emergency department patient was noted to have a temperature up to 103.73F, pulse 86-100s, respirations 21-26, and all other vitals are relatively maintained. Lab work revealed WBC 6.6, hemoglobin 11.3, platelets 136, BUN 27, creatinine 1.37, lactic acid 2.49. Urinalysis showed many bacteria with trace leukocyte estrace, negative nitrites, 0-5 squamous epithelial cells, and 0-5 WBCs. CT scan of her abdomen showed no acute abnormalities. She was empirically started on vancomycin and Zosyn as initially there was no clear source of infection.  Review of Systems: As per HPI otherwise 10 point review of systems negative.   Past Medical History:  Diagnosis Date  . Asthma   . Bronchitis   . COPD (chronic obstructive pulmonary disease) ([place])   .  Coronary artery disease    MI age 76  . GERD (gastroesophageal reflux disease)   . Heart attack   . Hypercholesteremia   . Hypertension   . Iron deficiency anemia    used to see hem in [place], [place].   [name] Renal insufficiency   . Thyroid disease     Past Surgical History:  Procedure Laterality Date  . APPENDECTOMY    . BREAST BIOPSY Right    benign  . CORONARY ANGIOPLASTY WITH STENT PLACEMENT    . CORONARY ARTERY BYPASS GRAFT  09/1979  . Moh's Surgeries     for squamous cell carcinoma  . PARTIAL HYSTERECTOMY  10/1978     reports that she quit smoking about 37 years ago. Her smoking use included Cigarettes. She has a 33.00 pack-year smoking history. She has never used smokeless tobacco. She reports that she drinks alcohol. She reports that she does not use drugs.  Allergies  Allergen Reactions  . Codeine Other (See Comments)    Headache from heavy doses of codeine only    Family History  Problem Relation Age of Onset  . Melanoma Sister     x2  . Heart disease Father   . Heart disease Mother     Prior to Admission medications   Medication Sig Start Date End Date Taking? Authorizing Provider  aspirin 81 MG tablet Take 81 mg by mouth daily.     Historical Provider, MD  atorvastatin (LIPITOR) 40 MG tablet  12/29/15   Historical Provider, MD  benzonatate (TESSALON) 100 MG capsule Take 1 capsule (100 mg total) by mouth 3 (three) times daily as  needed for cough. 03/27/16   Rigoberto Noel, MD  Calcium Carbonate-Vitamin D (CALCIUM 600+D) 600-200 MG-UNIT TABS Take 1 tablet by mouth 2 (two) times daily.    Historical Provider, MD  Cetirizine HCl (ZYRTEC ALLERGY PO) Take 10 mg by mouth daily.    Historical Provider, MD  ferrous sulfate 325 (65 FE) MG tablet Take 325 mg by mouth daily with breakfast.    Historical Provider, MD  isosorbide mononitrate (IMDUR) 60 MG 24 hr tablet Take 1 tablet (60 mg total) by mouth daily. 12/10/13   Jerline Pain, MD  levothyroxine (SYNTHROID, LEVOTHROID) 150 MCG  tablet Take 150 mcg by mouth daily.      Historical Provider, MD  losartan (COZAAR) 50 MG tablet Take 50 mg by mouth daily.    Historical Provider, MD  metoprolol (TOPROL-XL) 50 MG 24 hr tablet Take 50 mg by mouth daily.      Historical Provider, MD  Multiple Vitamins-Minerals (PRESERVISION AREDS 2 PO) Take 1 capsule by mouth 2 (two) times daily.    Historical Provider, MD  nitroGLYCERIN (NITROSTAT) 0.4 MG SL tablet Place 1 tablet (0.4 mg total) under the tongue every 5 (five) minutes as needed for chest pain. 07/22/14   Jerline Pain, MD  pantoprazole (PROTONIX) 40 MG tablet TAKE ONE TABLET BY MOUTH TWICE DAILY 09/25/16   Rigoberto Noel, MD  spironolactone-hydrochlorothiazide (ALDACTAZIDE) 25-25 MG per tablet Take 1 tablet by mouth daily.      Historical Provider, MD  traMADol (ULTRAM) 50 MG tablet Take 50 mg by mouth 2 (two) times daily. For pain. Maximum dose= 8 tablets per day    Historical Provider, MD    Physical Exam:   Constitutional: Elderly female who appears acutely sick, but nontoxic and follow commands  Vitals:   01/05/17 2034 01/05/17 2115 01/05/17 2201 01/05/17 2204  BP:  (!) 151/56    Pulse:  93  86  Resp:  (!) 26  (!) 22  Temp:   100.2 F (37.9 C)   TempSrc:   Oral   SpO2:  97%  92%  Weight: 83.9 kg (185 lb)      Eyes: PERRL, lids and conjunctivae normal ENMT: Mucous membranes are dry. Posterior pharynx clear of any exudate or lesions.  Neck: normal, supple, no masses, no thyromegaly Respiratory: tachypneic with no significant wheezes appreciated Cardiovascular: Regular rate and rhythm, no murmurs / rubs / gallops. Trace extremity edema. 2+ pedal pulses. No carotid bruits.  Abdomen: Lower abdominal tenderness noted, no masses palpated. No hepatosplenomegaly. Bowel sounds positive.  Musculoskeletal: no clubbing / cyanosis. No joint deformity upper and lower extremities. Good ROM, no contractures. Normal muscle tone.  Skin: no rashes, lesions, ulcers. No  induration Neurologic: CN 2-12 grossly intact. Sensation intact, DTR normal. Strength 5/5 in all 4.  Psychiatric: Normal judgment and insight. Lethargic, but easily arousable and oriented x 3. Normal mood.     Labs on Admission: I have personally reviewed following labs and imaging studies  CBC:  Recent Labs Lab 01/05/17 1950 01/05/17 2010  WBC 6.6  --   NEUTROABS 4.9  --   HGB 11.3* 10.9*  HCT 33.0* 32.0*  MCV 94.0  --   PLT 136*  --    Basic Metabolic Panel:  Recent Labs Lab 01/05/17 1950 01/05/17 2010  NA 137 140  K 4.4 4.4  CL 101 104  CO2 26  --   GLUCOSE 114* 112*  BUN 27* 29*  CREATININE 1.37* 1.40*  CALCIUM 9.8  --  GFR: Estimated Creatinine Clearance: 30.8 mL/min (A) (by C-G formula based on SCr of 1.4 mg/dL (H)). Liver Function Tests:  Recent Labs Lab 01/05/17 1950  AST 24  ALT 15  ALKPHOS 49  BILITOT 0.4  PROT 6.3*  ALBUMIN 3.7   No results for input(s): LIPASE, AMYLASE in the last 168 hours. No results for input(s): AMMONIA in the last 168 hours. Coagulation Profile: No results for input(s): INR, PROTIME in the last 168 hours. Cardiac Enzymes: No results for input(s): CKTOTAL, CKMB, CKMBINDEX, TROPONINI in the last 168 hours. BNP (last 3 results) No results for input(s): PROBNP in the last 8760 hours. HbA1C: No results for input(s): HGBA1C in the last 72 hours. CBG: No results for input(s): GLUCAP in the last 168 hours. Lipid Profile: No results for input(s): CHOL, HDL, LDLCALC, TRIG, CHOLHDL, LDLDIRECT in the last 72 hours. Thyroid Function Tests: No results for input(s): TSH, T4TOTAL, FREET4, T3FREE, THYROIDAB in the last 72 hours. Anemia Panel: No results for input(s): VITAMINB12, FOLATE, FERRITIN, TIBC, IRON, RETICCTPCT in the last 72 hours. Urine analysis:    Component Value Date/Time   COLORURINE YELLOW 01/05/2017 2018   APPEARANCEUR CLEAR 01/05/2017 2018   LABSPEC 1.013 01/05/2017 2018   PHURINE 7.0 01/05/2017 2018    GLUCOSEU NEGATIVE 01/05/2017 2018   HGBUR NEGATIVE 01/05/2017 2018   BILIRUBINUR NEGATIVE 01/05/2017 2018   KETONESUR NEGATIVE 01/05/2017 2018   PROTEINUR NEGATIVE 01/05/2017 2018   NITRITE NEGATIVE 01/05/2017 2018   LEUKOCYTESUR TRACE (A) 01/05/2017 2018   Sepsis Labs: No results found for this or any previous visit (from the past 240 hour(s)).   Radiological Exams on Admission: Dg Chest 2 View  Result Date: 01/05/2017 CLINICAL DATA:  Reason for exam: AMS, confusion and increased agitation. Medical hx: asthma, HTN, renal insufficiency, bronchitis, CAD, COPD, MI. EXAM: CHEST  2 VIEW COMPARISON:  10/09/2015 FINDINGS: Sternotomy wires overlie normal cardiac silhouette. No effusion, infiltrate pneumothorax. Degenerative osteophytosis of the spine. Mild interstitial pattern similar prior. IMPRESSION: No clear acute findings. Mild chronic bronchitic markings similar prior. Electronically Signed   By: Suzy Bouchard M.D.   On: 01/05/2017 21:58   Ct Abdomen Pelvis W Contrast  Result Date: 01/05/2017 CLINICAL DATA:  83 year old female with altered mental status and abdominal pain. EXAM: CT ABDOMEN AND PELVIS WITH CONTRAST TECHNIQUE: Multidetector CT imaging of the abdomen and pelvis was performed using the standard protocol following bolus administration of intravenous contrast. CONTRAST:  75 cc Isovue 300 COMPARISON:  None. FINDINGS: Lower chest: Mild emphysematous changes of the visualized lung bases with mild chronic interstitial coarsening. No focal consolidation. There is coronary vascular calcification. No intra-abdominal free air or free fluid. Hepatobiliary: No focal liver abnormality is seen. No gallstones, gallbladder wall thickening, or biliary dilatation. Pancreas: Unremarkable. No pancreatic ductal dilatation or surrounding inflammatory changes. Spleen: Normal in size without focal abnormality. Adrenals/Urinary Tract: The adrenal glands are unremarkable. The kidneys, visualized ureters, and  urinary bladder appear unremarkable as well. There is symmetric uptake and excretion of the intravenous contrast by kidneys. Stomach/Bowel: There is thickened appearance of the proximal stomach likely related to underdistention. No associated inflammatory changes. There is no evidence of bowel obstruction or active inflammation. Multiple normal caliber fecalized loops of distal small bowel, likely representing chronic stasis and increased transit time. There is no evidence of bowel obstruction or active inflammation. Appendectomy. Vascular/Lymphatic: There is advanced aortoiliac atherosclerotic disease. The origins of the celiac axis, SMA, IMA as well as the origins of the renal arteries appear patent.  The SMV, splenic vein, and main portal vein are patent. No portal venous gas identified. There is no adenopathy. Reproductive: Hysterectomy. Other: Small fat containing umbilical hernia. Musculoskeletal: Osteopenia with degenerative changes of the spine and hips. No acute fracture. Grade 1 L1-L2 retrolisthesis with near complete disc space height at this level. Grade 1 L5-S1 anterolisthesis. There is a transitional vertebra with lumbarization of the S1. IMPRESSION: No acute intra-abdominopelvic pathology. No evidence of bowel obstruction or active inflammation. Electronically Signed   By: Anner Crete M.D.   On: 01/05/2017 23:06    EKG: Independently reviewed. Sinus rhythm with signs of LVH  Assessment/Plan Sepsis 2/2 possible urinary tract infection: Acute. History of urinary urgency with abnormal UA. Patient presents with fever up to 103.59F, tachycardia, tachypnea, and lactic acid elevated at 2.49. Sepsis protocol was initiated patient received broad-spectrum antibiotics. Suspect possible urinary tract infection as cause but not totally sure at this time. Influenza screen pending. - Admit to telemetry attributed  - Follow-up blood/urine cultures and respiratory studies  - Continue empiric antibiotics  of vancomycin and Zosyn, de-escalate antibiotics when medically appropriate  - Tylenol prn fever - Husband would like patient's PCP notified on Monday if patient still hospitalized  Acute encephalopathy: Now resolved. After initial therapies patient more so back to baseline. - Continue to monitor   Essential hypertension - Continue metoprolol   Chronic kidney disease stage III: Patient presents with Cr 1.37 and BUN 27.  Husband gives history of decreased fluid intake at baseline. Question if there is an aspect of dehydration. - IVF NS at 75 ml/hr - Follow-up for repeat bmp in a.m.  History of COPD/asthma/ILD: Family reports patient with moreso of a chronic cough over the last year. - Continue Claritin, Mucinex,  - Duonebs prn sob/wheezing  Hypothyroidism  - Check TSH - Continue levothyroxine  Coronary artery disease - ASA   Anemia hemoglobin 11.3 on admission. - Continue to monitor  Thrombocytopenia: Platelet count on admission 136, but no active signs of bleeding. - continue to monitor DVT prophylaxis: Lovenox Code Status: Full  Family Communication: Discussed plan of care with the patient and family present at bedside Disposition Plan:  Likely discharge home once medically stable Consults called: none Admission status: Inpatient  Norval Morton MD [facility] Pager [phone]  If 7PM-7AM, please contact night-coverage [email or web address] Password [facility]  01/05/2017, 11:34 PM

## 2017-01-05 NOTE — ED Notes (Signed)
Pt to xray via stretcher

## 2017-01-05 NOTE — ED Provider Notes (Signed)
8:46 PM Patient seen together with pain McDonald. Patient with confusion, fever, urinary symptoms from home. Patient is noted to be febrile presentation, 102 rectally. She is alert and oriented at this time, however according to her husband was confused and combative at home which is unusual for her. The only complaint patient has at this time is generalized malaise and urinary urgency. We will check labs, urinalysis, chest x-ray. Patient does meet sepsis criteria, will place order set including antibiotics and blood cultures. Will hydrate carefully.  11:36 PM On reassessment patient did have some abdominal tenderness. CT abdomen and pelvis was obtained and is unremarkable. She does have many bacteria on her urinalysis. Patient already received vancomycin and Zosyn for unknown source of infection during sepsis order set. Patient's temperature improved with Tylenol. She is feeling slightly improved. I will admit her for UTI and altered mental status.  Spoke with [facility], will admit.    Cindy Senior, PA-C 01/06/17 [place], DO 01/07/17 0011

## 2017-01-05 NOTE — ED Provider Notes (Signed)
[place] DEPT Provider Note   CSN: [id number] Arrival date & time: 01/05/17  1935  History   Chief Complaint Chief Complaint  Patient presents with  . Altered Mental Status    HPI Cindy [name] is a 83 y.o. female who presents to the Emergency Department by EMS for altered mental status. Her husband reports increasing agitation and confusion that began this afternoon and worsened throughout the evening. He noted that she repeatedly turned on the fireplace in their living room after he would turn it off because she felt cold. EMS was called after she told her husband several times that she needed to void, but could not remember that she had just been to the restroom and became increasingly agitated.   PMH includes CAD, MI x2, CKD stage II, hypothyroidism, HTN, COPD, arthritis  Allergic to codeine. Surgical hx include bilateral TKA, partial hysterectomy, and coronary angioplasty. She is a former smoker and quit in 1981.   HPI  Past Medical History:  Diagnosis Date  . Asthma   . Bronchitis   . COPD (chronic obstructive pulmonary disease) ([place])   . Coronary artery disease    MI age 43  . GERD (gastroesophageal reflux disease)   . Heart attack   . Hypercholesteremia   . Hypertension   . Iron deficiency anemia    used to see hem in [place], [place].   [name] Renal insufficiency   . Thyroid disease     Patient Active Problem List   Diagnosis Date Noted  . Sepsis ([place]) 01/06/2017  . Umbilical hernia 51/88/4166  . Coronary atherosclerosis of native coronary artery 12/11/2013  . Old MI (myocardial infarction) 12/11/2013  . History of tobacco use 12/11/2013  . Angina decubitus ([place]) 12/11/2013  . Hyperlipidemia 12/11/2013  . Rhinitis 09/16/2012  . ILD (interstitial lung disease) ([place]) 05/08/2012  . Pulmonary nodule, right 05/06/2012  . Cough 03/20/2012  . Iron deficiency anemia   . Renal insufficiency     Past Surgical History:  Procedure Laterality Date  . APPENDECTOMY    .  BREAST BIOPSY Right    benign  . CORONARY ANGIOPLASTY WITH STENT PLACEMENT    . CORONARY ARTERY BYPASS GRAFT  09/1979  . Moh's Surgeries     for squamous cell carcinoma  . PARTIAL HYSTERECTOMY  10/1978    OB History    No data available     Home Medications    Prior to Admission medications   Medication Sig Start Date End Date Taking? Authorizing Provider  aspirin 81 MG tablet Take 81 mg by mouth daily.    Yes Historical Provider, MD  atorvastatin (LIPITOR) 40 MG tablet Take 40 mg by mouth every evening.  12/29/15  Yes Historical Provider, MD  benzonatate (TESSALON) 100 MG capsule Take 1 capsule (100 mg total) by mouth 3 (three) times daily as needed for cough. 03/27/16  Yes Rigoberto Noel, MD  Calcium Carbonate-Vitamin D (CALCIUM 600+D) 600-200 MG-UNIT TABS Take 1 tablet by mouth 2 (two) times daily.   Yes Historical Provider, MD  Cetirizine HCl (ZYRTEC ALLERGY PO) Take 10 mg by mouth daily.   Yes Historical Provider, MD  ferrous sulfate 325 (65 FE) MG tablet Take 325 mg by mouth daily with breakfast.   Yes Historical Provider, MD  guaiFENesin (MUCINEX) 600 MG 12 hr tablet Take 600 mg by mouth 2 (two) times daily as needed for cough or to loosen phlegm.   Yes Historical Provider, MD  isosorbide mononitrate (IMDUR) 60 MG 24 hr  tablet Take 1 tablet (60 mg total) by mouth daily. 12/10/13  Yes Jerline Pain, MD  levothyroxine (SYNTHROID, LEVOTHROID) 150 MCG tablet Take 175 mcg by mouth daily.    Yes Historical Provider, MD  losartan (COZAAR) 50 MG tablet Take 50 mg by mouth daily.   Yes Historical Provider, MD  metoprolol (TOPROL-XL) 50 MG 24 hr tablet Take 50 mg by mouth daily.     Yes Historical Provider, MD  Multiple Vitamins-Minerals (PRESERVISION AREDS 2 PO) Take 1 capsule by mouth 2 (two) times daily.   Yes Historical Provider, MD  nitroGLYCERIN (NITROSTAT) 0.4 MG SL tablet Place 1 tablet (0.4 mg total) under the tongue every 5 (five) minutes as needed for chest pain. 07/22/14  Yes Jerline Pain, MD  pantoprazole (PROTONIX) 40 MG tablet TAKE ONE TABLET BY MOUTH TWICE DAILY 09/25/16  Yes Rigoberto Noel, MD  Polyethyl Glycol-Propyl Glycol (SYSTANE OP) Apply 1-2 drops to eye at bedtime.   Yes Historical Provider, MD  spironolactone-hydrochlorothiazide (ALDACTAZIDE) 25-25 MG per tablet Take 1 tablet by mouth daily.     Yes Historical Provider, MD  traMADol (ULTRAM) 50 MG tablet Take 50 mg by mouth 2 (two) times daily. For pain. Maximum dose= 8 tablets per day   Yes Historical Provider, MD    Family History Family History  Problem Relation Age of Onset  . Melanoma Sister     x2  . Heart disease Father   . Heart disease Mother     Social History Social History  Substance Use Topics  . Smoking status: Former Smoker    Packs/day: 1.50    Years: 22.00    Types: Cigarettes    Quit date: 10/23/1979  . Smokeless tobacco: Never Used  . Alcohol use Yes     Comment: occasional   Allergies   Codeine  Review of Systems Review of Systems  Constitutional: Positive for activity change and fever.  HENT: Negative for congestion.   Respiratory: Positive for cough (chronic). Negative for shortness of breath.   Cardiovascular: Negative for chest pain and leg swelling.  Gastrointestinal: Negative for abdominal distention and abdominal pain.  Genitourinary: Positive for difficulty urinating and urgency.       Urinary frequency and hesitancy  Musculoskeletal: Positive for arthralgias (chronic).  Skin: Negative for rash.  Neurological: Negative for syncope and headaches.  Psychiatric/Behavioral: Positive for agitation and confusion.   Physical Exam Updated Vital Signs BP (!) 151/56   Pulse 86   Temp 100.2 F (37.9 C) (Oral)   Resp (!) 22   Wt 83.9 kg   LMP  (LMP Unknown)   SpO2 92%   BMI 34.39 kg/m   Physical Exam  Constitutional: She is oriented to person, place, and time.  HENT:  Head: Normocephalic and atraumatic.  Eyes: Conjunctivae are normal. Pupils are equal,  round, and reactive to light.  Cardiovascular: Normal rate, regular rhythm, normal heart sounds and intact distal pulses.  Exam reveals no gallop and no friction rub.   No murmur heard. Pulmonary/Chest: Effort normal and breath sounds normal. No respiratory distress. She has no wheezes. She has no rales. She exhibits no tenderness.  Abdominal: Soft. She exhibits no distension. There is tenderness in the epigastric area and periumbilical area.  Neurological: She is alert and oriented to person, place, and time. She has normal strength. No sensory deficit. She exhibits normal muscle tone. She displays no seizure activity. Coordination normal. GCS eye subscore is 4. GCS verbal subscore is 5. GCS  motor subscore is 6.  Patient was initially alert and oriented x2, which improved to a&o x3 through the ED.   Skin: No rash noted.  Nursing note and vitals reviewed.  ED Treatments / Results  Labs (all labs ordered are listed, but only abnormal results are displayed) Labs Reviewed  CBC WITH DIFFERENTIAL/PLATELET - Abnormal; Notable for the following:       Result Value   RBC 3.51 (*)    Hemoglobin 11.3 (*)    HCT 33.0 (*)    Platelets 136 (*)    All other components within normal limits  COMPREHENSIVE METABOLIC PANEL - Abnormal; Notable for the following:    Glucose, Bld 114 (*)    BUN 27 (*)    Creatinine, Ser 1.37 (*)    Total Protein 6.3 (*)    GFR calc non Af Amer 35 (*)    GFR calc Af Amer 40 (*)    All other components within normal limits  URINALYSIS, ROUTINE W REFLEX MICROSCOPIC - Abnormal; Notable for the following:    Leukocytes, UA TRACE (*)    Bacteria, UA MANY (*)    Squamous Epithelial / LPF 0-5 (*)    All other components within normal limits  I-STAT CHEM 8, ED - Abnormal; Notable for the following:    BUN 29 (*)    Creatinine, Ser 1.40 (*)    Glucose, Bld 112 (*)    Hemoglobin 10.9 (*)    HCT 32.0 (*)    All other components within normal limits  I-STAT CG4 LACTIC ACID,  ED - Abnormal; Notable for the following:    Lactic Acid, Venous 2.49 (*)    All other components within normal limits  URINE CULTURE  CULTURE, BLOOD (ROUTINE X 2)  CULTURE, BLOOD (ROUTINE X 2)  LACTIC ACID, PLASMA  INFLUENZA PANEL BY PCR (TYPE A & B)  PROCALCITONIN  CBC  BASIC METABOLIC PANEL  TSH  I-STAT TROPOININ, ED   EKG  EKG Interpretation  Date/Time:  Saturday January 05 2017 19:45:48 EDT Ventricular Rate:  95 PR Interval:    QRS Duration: 141 QT Interval:  339 QTC Calculation: 427 R Axis:   81 Text Interpretation:  Sinus rhythm Borderline prolonged PR interval LVH with secondary repolarization abnormality Anterior Q waves, possibly due to LVH No significant change since last tracing Confirmed by FLOYD MD, DANIEL [phone]) on 01/05/2017 7:49:14 PM      Radiology Dg Chest 2 View  Result Date: 01/05/2017 CLINICAL DATA:  Reason for exam: AMS, confusion and increased agitation. Medical hx: asthma, HTN, renal insufficiency, bronchitis, CAD, COPD, MI. EXAM: CHEST  2 VIEW COMPARISON:  10/09/2015 FINDINGS: Sternotomy wires overlie normal cardiac silhouette. No effusion, infiltrate pneumothorax. Degenerative osteophytosis of the spine. Mild interstitial pattern similar prior. IMPRESSION: No clear acute findings. Mild chronic bronchitic markings similar prior. Electronically Signed   By: Suzy Bouchard M.D.   On: 01/05/2017 21:58   Ct Abdomen Pelvis W Contrast  Result Date: 01/05/2017 CLINICAL DATA:  83 year old female with altered mental status and abdominal pain. EXAM: CT ABDOMEN AND PELVIS WITH CONTRAST TECHNIQUE: Multidetector CT imaging of the abdomen and pelvis was performed using the standard protocol following bolus administration of intravenous contrast. CONTRAST:  75 cc Isovue 300 COMPARISON:  None. FINDINGS: Lower chest: Mild emphysematous changes of the visualized lung bases with mild chronic interstitial coarsening. No focal consolidation. There is coronary vascular  calcification. No intra-abdominal free air or free fluid. Hepatobiliary: No focal liver abnormality is seen. No  gallstones, gallbladder wall thickening, or biliary dilatation. Pancreas: Unremarkable. No pancreatic ductal dilatation or surrounding inflammatory changes. Spleen: Normal in size without focal abnormality. Adrenals/Urinary Tract: The adrenal glands are unremarkable. The kidneys, visualized ureters, and urinary bladder appear unremarkable as well. There is symmetric uptake and excretion of the intravenous contrast by kidneys. Stomach/Bowel: There is thickened appearance of the proximal stomach likely related to underdistention. No associated inflammatory changes. There is no evidence of bowel obstruction or active inflammation. Multiple normal caliber fecalized loops of distal small bowel, likely representing chronic stasis and increased transit time. There is no evidence of bowel obstruction or active inflammation. Appendectomy. Vascular/Lymphatic: There is advanced aortoiliac atherosclerotic disease. The origins of the celiac axis, SMA, IMA as well as the origins of the renal arteries appear patent. The SMV, splenic vein, and main portal vein are patent. No portal venous gas identified. There is no adenopathy. Reproductive: Hysterectomy. Other: Small fat containing umbilical hernia. Musculoskeletal: Osteopenia with degenerative changes of the spine and hips. No acute fracture. Grade 1 L1-L2 retrolisthesis with near complete disc space height at this level. Grade 1 L5-S1 anterolisthesis. There is a transitional vertebra with lumbarization of the S1. IMPRESSION: No acute intra-abdominopelvic pathology. No evidence of bowel obstruction or active inflammation. Electronically Signed   By: Anner Crete M.D.   On: 01/05/2017 23:06    Procedures Procedures (including critical care time)  Medications Ordered in ED Medications  piperacillin-tazobactam (ZOSYN) IVPB 3.375 g (not administered)    vancomycin (VANCOCIN) IVPB 750 mg/150 ml premix (not administered)  iopamidol (ISOVUE-300) 61 % injection 75 mL (not administered)  guaiFENesin (MUCINEX) 12 hr tablet 600 mg (not administered)  pantoprazole (PROTONIX) EC tablet 40 mg (not administered)  benzonatate (TESSALON) capsule 200 mg (not administered)  cetirizine (ZYRTEC) tablet 10 mg (not administered)  nitroGLYCERIN (NITROSTAT) SL tablet 0.4 mg (not administered)  levothyroxine (SYNTHROID, LEVOTHROID) tablet 175 mcg (not administered)  metoprolol succinate (TOPROL-XL) 24 hr tablet 50 mg (not administered)  aspirin EC tablet 81 mg (not administered)  enoxaparin (LOVENOX) injection 40 mg (not administered)  ondansetron (ZOFRAN) tablet 4 mg (not administered)    Or  ondansetron (ZOFRAN) injection 4 mg (not administered)  acetaminophen (TYLENOL) tablet 650 mg (not administered)    Or  acetaminophen (TYLENOL) suppository 650 mg (not administered)  0.9 %  sodium chloride infusion (not administered)  ipratropium-albuterol (DUONEB) 0.5-2.5 (3) MG/3ML nebulizer solution 3 mL (not administered)  sodium chloride 0.9 % bolus 1,000 mL (0 mLs Intravenous Stopped 01/05/17 2200)  piperacillin-tazobactam (ZOSYN) IVPB 3.375 g (0 g Intravenous Stopped 01/05/17 2200)  acetaminophen (TYLENOL) tablet 650 mg (650 mg Oral Given 01/05/17 2015)  vancomycin (VANCOCIN) 2,000 mg in sodium chloride 0.9 % 500 mL IVPB (2,000 mg Intravenous New Bag/Given 01/05/17 2201)   Initial Impression / Assessment and Plan / ED Course  I have reviewed the triage vital signs and the nursing notes.  Pertinent labs & imaging results that were available during my care of the patient were reviewed by me and considered in my medical decision making (see chart for details).     - 20:10 Code Sepsis initiated.   CRITICAL CARE Performed by: Christ Kick Total critical care time: 30 minutes Critical care time was exclusive of separately billable procedures and treating  other patients. Critical care was necessary to treat or prevent imminent or life-threatening deterioration. Critical care was time spent personally by me on the following activities: development of treatment plan with patient and/or surrogate as well as nursing,  discussions with consultants, evaluation of patient's response to treatment, examination of patient, obtaining history from patient or surrogate, ordering and performing treatments and interventions, ordering and review of laboratory studies, ordering and review of radiographic studies, pulse oximetry and re-evaluation of patient's condition.  Final Clinical Impressions(s) / ED Diagnoses   Final diagnoses:  Altered mental status, unspecified altered mental status type  Lower urinary tract infectious disease   83 y.o. female presents to the Emergency Department with altered mental status.  Husband reports agitation and confusion that began this afternoon and continued to worsen this evening. No history of dementia.   Upon arrival to the ED, the patient was noted to have a rectal temperature of 103.4 F, tachycardia, and systolic BP in the 324M. The patient met the sepsis criteria, and the order set was placed, including antibiotics and blood cultures.     Review of systems was positive for urinary urgency and frequency. Lab work up revealed lactic acid 2.49, WBC 6.6. UA showed trace leukocyte esterase and many bacteria. Nitrite negative. CT A/P and CXR were unremarkable. Zosyn and Vancomycin were initiated in the ED for unknown source of infection during sepsis work up.   The patient's fever improved with Tylenol. Lactic acid improved to 0.8 in the ED.   Will admit the patient with [facility] for continued work up of UTI and altered mental status.    New Prescriptions New Prescriptions   No medications on file     [place], PA-C 01/06/17 [place], DO 01/07/17 0011

## 2017-01-05 NOTE — Progress Notes (Signed)
Pharmacy Antibiotic Note  Cindy [name] is a 83 y.o. female admitted on 01/05/2017 with sepsis.  Pharmacy has been consulted for vancomycin/zosyn dosing. SCr 1.4 on admit, CrCl~41.  Plan: Zosyn 3.375g IV (14min inf) x1; then 3.375g IV q8h (4h inf) Vancomycin 2g IV x1; then 750mg  IV q12h Monitor clinical progress, c/s, renal function, abx plan/LOT Vancomycin trough as indicated      No data recorded.   Recent Labs Lab 01/05/17 2010  CREATININE 1.40*    CrCl cannot be calculated (Unknown ideal weight.).    Allergies  Allergen Reactions  . Codeine Other (See Comments)    Headache from heavy doses of codeine only    Elicia Lamp, PharmD, BCPS Clinical Pharmacist 01/05/2017 8:39 PM

## 2017-01-05 NOTE — ED Triage Notes (Signed)
Patient comes from home for new onset agitation and confusion.  Patient is warm to the touch and has periods of being confused and then able to answer questions appropriately. Husband is on the way, he can help with better history.

## 2017-01-06 DIAGNOSIS — D649 Anemia, unspecified: Secondary | ICD-10-CM | POA: Diagnosis present

## 2017-01-06 DIAGNOSIS — D696 Thrombocytopenia, unspecified: Secondary | ICD-10-CM | POA: Diagnosis present

## 2017-01-06 DIAGNOSIS — Z951 Presence of aortocoronary bypass graft: Secondary | ICD-10-CM | POA: Diagnosis not present

## 2017-01-06 DIAGNOSIS — R4182 Altered mental status, unspecified: Secondary | ICD-10-CM | POA: Diagnosis not present

## 2017-01-06 DIAGNOSIS — J449 Chronic obstructive pulmonary disease, unspecified: Secondary | ICD-10-CM | POA: Diagnosis present

## 2017-01-06 DIAGNOSIS — Z87891 Personal history of nicotine dependence: Secondary | ICD-10-CM | POA: Diagnosis not present

## 2017-01-06 DIAGNOSIS — N390 Urinary tract infection, site not specified: Secondary | ICD-10-CM | POA: Diagnosis not present

## 2017-01-06 DIAGNOSIS — I252 Old myocardial infarction: Secondary | ICD-10-CM | POA: Diagnosis not present

## 2017-01-06 DIAGNOSIS — K219 Gastro-esophageal reflux disease without esophagitis: Secondary | ICD-10-CM | POA: Diagnosis present

## 2017-01-06 DIAGNOSIS — Z90711 Acquired absence of uterus with remaining cervical stump: Secondary | ICD-10-CM | POA: Diagnosis not present

## 2017-01-06 DIAGNOSIS — I129 Hypertensive chronic kidney disease with stage 1 through stage 4 chronic kidney disease, or unspecified chronic kidney disease: Secondary | ICD-10-CM | POA: Diagnosis present

## 2017-01-06 DIAGNOSIS — G9340 Encephalopathy, unspecified: Secondary | ICD-10-CM | POA: Diagnosis present

## 2017-01-06 DIAGNOSIS — Z79899 Other long term (current) drug therapy: Secondary | ICD-10-CM | POA: Diagnosis not present

## 2017-01-06 DIAGNOSIS — G92 Toxic encephalopathy: Secondary | ICD-10-CM | POA: Diagnosis present

## 2017-01-06 DIAGNOSIS — Z9049 Acquired absence of other specified parts of digestive tract: Secondary | ICD-10-CM | POA: Diagnosis not present

## 2017-01-06 DIAGNOSIS — J849 Interstitial pulmonary disease, unspecified: Secondary | ICD-10-CM | POA: Diagnosis present

## 2017-01-06 DIAGNOSIS — Z7982 Long term (current) use of aspirin: Secondary | ICD-10-CM | POA: Diagnosis not present

## 2017-01-06 DIAGNOSIS — A419 Sepsis, unspecified organism: Secondary | ICD-10-CM | POA: Diagnosis not present

## 2017-01-06 DIAGNOSIS — I2510 Atherosclerotic heart disease of native coronary artery without angina pectoris: Secondary | ICD-10-CM | POA: Diagnosis not present

## 2017-01-06 DIAGNOSIS — Z8249 Family history of ischemic heart disease and other diseases of the circulatory system: Secondary | ICD-10-CM | POA: Diagnosis not present

## 2017-01-06 DIAGNOSIS — Z808 Family history of malignant neoplasm of other organs or systems: Secondary | ICD-10-CM | POA: Diagnosis not present

## 2017-01-06 DIAGNOSIS — Z955 Presence of coronary angioplasty implant and graft: Secondary | ICD-10-CM | POA: Diagnosis not present

## 2017-01-06 DIAGNOSIS — Z886 Allergy status to analgesic agent status: Secondary | ICD-10-CM | POA: Diagnosis not present

## 2017-01-06 DIAGNOSIS — R000 Tachycardia, unspecified: Secondary | ICD-10-CM | POA: Diagnosis present

## 2017-01-06 DIAGNOSIS — N183 Chronic kidney disease, stage 3 (moderate): Secondary | ICD-10-CM | POA: Diagnosis present

## 2017-01-06 DIAGNOSIS — E039 Hypothyroidism, unspecified: Secondary | ICD-10-CM | POA: Diagnosis present

## 2017-01-06 LAB — BASIC METABOLIC PANEL
Anion gap: 6 (ref 5–15)
BUN: 24 mg/dL — AB (ref 6–20)
CHLORIDE: 109 mmol/L (ref 101–111)
CO2: 24 mmol/L (ref 22–32)
CREATININE: 1.32 mg/dL — AB (ref 0.44–1.00)
Calcium: 9.2 mg/dL (ref 8.9–10.3)
GFR calc Af Amer: 42 mL/min — ABNORMAL LOW (ref >60)
GFR calc non Af Amer: 36 mL/min — ABNORMAL LOW (ref >60)
Glucose, Bld: 98 mg/dL (ref 65–99)
Potassium: 3.9 mmol/L (ref 3.5–5.1)
Sodium: 139 mmol/L (ref 135–145)

## 2017-01-06 LAB — CBC
HEMATOCRIT: 30.6 % — AB (ref 36.0–46.0)
HEMOGLOBIN: 10.3 g/dL — AB (ref 12.0–15.0)
MCH: 31.6 pg (ref 26.0–34.0)
MCHC: 33.7 g/dL (ref 30.0–36.0)
MCV: 93.9 fL (ref 78.0–100.0)
Platelets: 125 10*3/uL — ABNORMAL LOW (ref 150–400)
RBC: 3.26 MIL/uL — ABNORMAL LOW (ref 3.87–5.11)
RDW: 12.7 % (ref 11.5–15.5)
WBC: 6.5 10*3/uL (ref 4.0–10.5)

## 2017-01-06 LAB — TSH: TSH: 1.053 u[IU]/mL (ref 0.350–4.500)

## 2017-01-06 LAB — INFLUENZA PANEL BY PCR (TYPE A & B)
INFLBPCR: NEGATIVE
Influenza A By PCR: NEGATIVE

## 2017-01-06 LAB — PROCALCITONIN: Procalcitonin: 0.4 ng/mL

## 2017-01-06 MED ORDER — METOPROLOL SUCCINATE ER 50 MG PO TB24
50.0000 mg | ORAL_TABLET | Freq: Every day | ORAL | Status: DC
  Administered 2017-01-06 – 2017-01-07 (×2): 50 mg via ORAL
  Filled 2017-01-06 (×2): qty 1

## 2017-01-06 MED ORDER — ENOXAPARIN SODIUM 40 MG/0.4ML ~~LOC~~ SOLN
40.0000 mg | SUBCUTANEOUS | Status: DC
  Administered 2017-01-06: 40 mg via SUBCUTANEOUS
  Filled 2017-01-06 (×2): qty 0.4

## 2017-01-06 MED ORDER — ONDANSETRON HCL 4 MG PO TABS: 4.0000 mg | ORAL_TABLET | Freq: Four times a day (QID) | ORAL | Status: DC | PRN

## 2017-01-06 MED ORDER — NITROGLYCERIN 0.4 MG SL SUBL: 0.4000 mg | SUBLINGUAL_TABLET | SUBLINGUAL | Status: DC | PRN

## 2017-01-06 MED ORDER — ONDANSETRON HCL 4 MG/2ML IJ SOLN: 4.0000 mg | Freq: Four times a day (QID) | INTRAMUSCULAR | Status: DC | PRN

## 2017-01-06 MED ORDER — PANTOPRAZOLE SODIUM 40 MG PO TBEC
40.0000 mg | DELAYED_RELEASE_TABLET | Freq: Two times a day (BID) | ORAL | Status: DC
  Administered 2017-01-06 – 2017-01-07 (×3): 40 mg via ORAL
  Filled 2017-01-06 (×3): qty 1

## 2017-01-06 MED ORDER — ACETAMINOPHEN 325 MG PO TABS
650.0000 mg | ORAL_TABLET | Freq: Four times a day (QID) | ORAL | Status: DC | PRN
Start: 2017-01-06 — End: 2017-01-06

## 2017-01-06 MED ORDER — ASPIRIN EC 81 MG PO TBEC
81.0000 mg | DELAYED_RELEASE_TABLET | Freq: Every day | ORAL | Status: DC
  Administered 2017-01-06 – 2017-01-07 (×2): 81 mg via ORAL
  Filled 2017-01-06 (×2): qty 1

## 2017-01-06 MED ORDER — BENZONATATE 100 MG PO CAPS
200.0000 mg | ORAL_CAPSULE | Freq: Three times a day (TID) | ORAL | Status: DC | PRN
  Administered 2017-01-06: 200 mg via ORAL
  Filled 2017-01-06: qty 2

## 2017-01-06 MED ORDER — ACETAMINOPHEN 650 MG RE SUPP: 650.0000 mg | Freq: Four times a day (QID) | RECTAL | Status: DC | PRN

## 2017-01-06 MED ORDER — GUAIFENESIN ER 600 MG PO TB12: 600.0000 mg | ORAL_TABLET | Freq: Two times a day (BID) | ORAL | Status: DC | PRN

## 2017-01-06 MED ORDER — CETIRIZINE HCL 10 MG PO TABS
10.0000 mg | ORAL_TABLET | Freq: Every day | ORAL | Status: DC
  Administered 2017-01-06 – 2017-01-07 (×2): 10 mg via ORAL
  Filled 2017-01-06 (×2): qty 1

## 2017-01-06 MED ORDER — SODIUM CHLORIDE 0.9 % IV SOLN
INTRAVENOUS | Status: DC
  Administered 2017-01-06: 03:00:00 via INTRAVENOUS

## 2017-01-06 MED ORDER — IPRATROPIUM-ALBUTEROL 0.5-2.5 (3) MG/3ML IN SOLN: 3.0000 mL | RESPIRATORY_TRACT | Status: DC | PRN

## 2017-01-06 MED ORDER — LEVOTHYROXINE SODIUM 175 MCG PO TABS
175.0000 ug | ORAL_TABLET | Freq: Every day | ORAL | Status: DC
  Administered 2017-01-06 – 2017-01-07 (×2): 175 ug via ORAL
  Filled 2017-01-06 (×2): qty 1

## 2017-01-06 NOTE — Progress Notes (Signed)
PROGRESS NOTE                                                                                                                                                                                                             Patient Demographics:    Cindy [name], is a 83 y.o. female, DOB - 14-Feb-1932, OZD:[id number]  Admit date - 01/05/2017   Admitting Physician Norval Morton, MD  Outpatient Primary MD for the patient is Melinda Crutch, MD  LOS - 0  Chief Complaint  Patient presents with  . Altered Mental Status       Brief Narrative  Cindy [name] is a 83 y.o. female with medical history significant of HTN, asthma/COPD, CAD, hypothyroidism, and CKD stage III; presents after being found acutely disoriented, is found to have sepsis due to UTI in the ER.   Subjective:    Cindy [name] today has, No headache, No chest pain, No abdominal pain - No Nausea, No new weakness tingling or numbness, No Cough - SOB.     Assessment  & Plan :     1.Sepsis due to UTI. Much improved with supportive care which includes IV fluids and empiric IV antibiotics, sepsis physiology has resolved, monitor blood cultures and urine cultures, advance activity.  2. Toxic encephalopathy. Due to #1 above. Resolved.  3. Essential hypertension. Stable on beta blocker.  4. CK D3. Creatinine close to baseline monitor.  5. History of COPD, asthma and interstitial lung disease. No acute issues. No wheezing or shortness of breath, continue supportive care with nebulizer treatments and as needed oxygen.  6. Hypothyroidism. Continue home dose Synthroid. TSH stable.  7. CAD. Chest pain-free, no acute issues, continue aspirin and beta blocker for secondary prevention.  8. GERD. On PPI continue.    Diet : Diet Heart Room service appropriate? Yes; Fluid consistency: Thin    Family Communication  :  Son and husband  Code Status :  Full  Disposition Plan   :  HHPT in am  Consults  :  None  Procedures  :  CT Abdominal and pelvis. Nonacute.  DVT Prophylaxis  :  Lovenox   Lab Results  Component Value Date   PLT 125 (L) 01/06/2017    Inpatient Medications  Scheduled Meds: . aspirin EC  81 mg Oral Daily  . cetirizine  10 mg Oral Daily  . enoxaparin (LOVENOX) injection  40 mg Subcutaneous Q24H  . levothyroxine  175 mcg Oral QAC breakfast  . metoprolol succinate  50 mg Oral Daily  . pantoprazole  40 mg Oral BID  . piperacillin-tazobactam (ZOSYN)  IV  3.375 g Intravenous Q8H  . vancomycin  750 mg Intravenous Q12H   Continuous Infusions: . sodium chloride 75 mL/hr at 01/06/17 0248   PRN Meds:.acetaminophen **OR** acetaminophen, benzonatate, guaiFENesin, iopamidol, ipratropium-albuterol, nitroGLYCERIN, ondansetron **OR** ondansetron (ZOFRAN) IV  Antibiotics  :    Anti-infectives    Start     Dose/Rate Route Frequency Ordered Stop   01/06/17 1000  vancomycin (VANCOCIN) IVPB 750 mg/150 ml premix     750 mg 150 mL/hr over 60 Minutes Intravenous Every 12 hours 01/05/17 2025     01/06/17 0600  piperacillin-tazobactam (ZOSYN) IVPB 3.375 g     3.375 g 12.5 mL/hr over 240 Minutes Intravenous Every 8 hours 01/05/17 2025     01/05/17 2030  vancomycin (VANCOCIN) 2,000 mg in sodium chloride 0.9 % 500 mL IVPB     2,000 mg 250 mL/hr over 120 Minutes Intravenous  Once 01/05/17 2021 01/06/17 0159   01/05/17 2015  piperacillin-tazobactam (ZOSYN) IVPB 3.375 g     3.375 g 100 mL/hr over 30 Minutes Intravenous  Once 01/05/17 2009 01/05/17 2200   01/05/17 2015  vancomycin (VANCOCIN) IVPB 1000 mg/200 mL premix  Status:  Discontinued     1,000 mg 200 mL/hr over 60 Minutes Intravenous  Once 01/05/17 2009 01/05/17 2021         Objective:   Vitals:   01/06/17 0200 01/06/17 0249 01/06/17 0700 01/06/17 0755  BP: (!) 127/48 (!) 144/53  (!) 150/36  Pulse: (!) 40 62 71 64  Resp: 17 18 (!) 21 16  Temp:  98.2 F (36.8 C)    TempSrc:  Oral  Oral    SpO2: 95% 95% 95% 97%  Weight:  86.9 kg (191 lb 8 oz)    Height:  5\' 1"  (1.549 m)      Wt Readings from Last 3 Encounters:  01/06/17 86.9 kg (191 lb 8 oz)  12/11/16 85.4 kg (188 lb 3.2 oz)  03/27/16 83.6 kg (184 lb 3.2 oz)     Intake/Output Summary (Last 24 hours) at 01/06/17 1212 Last data filed at 01/06/17 1000  Gross per 24 hour  Intake             2430 ml  Output              519 ml  Net             1911 ml     Physical Exam  Awake Alert, Oriented X 3, No new F.N deficits, Normal affect Watson.AT,PERRAL Supple Neck,No JVD, No cervical lymphadenopathy appriciated.  Symmetrical Chest wall movement, Good air movement bilaterally, CTAB RRR,No Gallops,Rubs or new Murmurs, No Parasternal Heave +ve B.Sounds, Abd Soft, No tenderness, No organomegaly appriciated, No rebound - guarding or rigidity. No Cyanosis, Clubbing or edema, No new Rash or bruise      Data Review:    CBC  Recent Labs Lab 01/05/17 1950 01/05/17 2010 01/06/17 0433  WBC 6.6  --  6.5  HGB 11.3* 10.9* 10.3*  HCT 33.0* 32.0* 30.6*  PLT 136*  --  125*  MCV 94.0  --  93.9  MCH 32.2  --  31.6  MCHC 34.2  --  33.7  RDW 12.9  --  12.7  LYMPHSABS 0.8  --   --   MONOABS 0.7  --   --   EOSABS 0.2  --   --   BASOSABS 0.0  --   --     Chemistries   Recent Labs Lab 01/05/17 1950 01/05/17 2010 01/06/17 0433  NA 137 140 139  K 4.4 4.4 3.9  CL 101 104 109  CO2 26  --  24  GLUCOSE 114* 112* 98  BUN 27* 29* 24*  CREATININE 1.37* 1.40* 1.32*  CALCIUM 9.8  --  9.2  AST 24  --   --   ALT 15  --   --   ALKPHOS 49  --   --   BILITOT 0.4  --   --    ------------------------------------------------------------------------------------------------------------------ No results for input(s): CHOL, HDL, LDLCALC, TRIG, CHOLHDL, LDLDIRECT in the last 72 hours.  No results found for:  HGBA1C ------------------------------------------------------------------------------------------------------------------  Recent Labs  01/06/17 0433  TSH 1.053   ------------------------------------------------------------------------------------------------------------------ No results for input(s): VITAMINB12, FOLATE, FERRITIN, TIBC, IRON, RETICCTPCT in the last 72 hours.  Coagulation profile No results for input(s): INR, PROTIME in the last 168 hours.  No results for input(s): DDIMER in the last 72 hours.  Cardiac Enzymes No results for input(s): CKMB, TROPONINI, MYOGLOBIN in the last 168 hours.  Invalid input(s): CK ------------------------------------------------------------------------------------------------------------------ No results found for: BNP  Micro Results No results found for this or any previous visit (from the past 240 hour(s)).  Radiology Reports Dg Chest 2 View  Result Date: 01/05/2017 CLINICAL DATA:  Reason for exam: AMS, confusion and increased agitation. Medical hx: asthma, HTN, renal insufficiency, bronchitis, CAD, COPD, MI. EXAM: CHEST  2 VIEW COMPARISON:  10/09/2015 FINDINGS: Sternotomy wires overlie normal cardiac silhouette. No effusion, infiltrate pneumothorax. Degenerative osteophytosis of the spine. Mild interstitial pattern similar prior. IMPRESSION: No clear acute findings. Mild chronic bronchitic markings similar prior. Electronically Signed   By: Suzy Bouchard M.D.   On: 01/05/2017 21:58   Ct Abdomen Pelvis W Contrast  Result Date: 01/05/2017 CLINICAL DATA:  83 year old female with altered mental status and abdominal pain. EXAM: CT ABDOMEN AND PELVIS WITH CONTRAST TECHNIQUE: Multidetector CT imaging of the abdomen and pelvis was performed using the standard protocol following bolus administration of intravenous contrast. CONTRAST:  75 cc Isovue 300 COMPARISON:  None. FINDINGS: Lower chest: Mild emphysematous changes of the visualized lung  bases with mild chronic interstitial coarsening. No focal consolidation. There is coronary vascular calcification. No intra-abdominal free air or free fluid. Hepatobiliary: No focal liver abnormality is seen. No gallstones, gallbladder wall thickening, or biliary dilatation. Pancreas: Unremarkable. No pancreatic ductal dilatation or surrounding inflammatory changes. Spleen: Normal in size without focal abnormality. Adrenals/Urinary Tract: The adrenal glands are unremarkable. The kidneys, visualized ureters, and urinary bladder appear unremarkable as well. There is symmetric uptake and excretion of the intravenous contrast by kidneys. Stomach/Bowel: There is thickened appearance of the proximal stomach likely related to underdistention. No associated inflammatory changes. There is no evidence of bowel obstruction or active inflammation. Multiple normal caliber fecalized loops of distal small bowel, likely representing chronic stasis and increased transit time. There is no evidence of bowel obstruction or active inflammation. Appendectomy. Vascular/Lymphatic: There is advanced aortoiliac atherosclerotic disease. The origins of the celiac axis, SMA, IMA as well as the origins of the renal arteries appear patent. The SMV, splenic vein, and main portal vein are patent. No portal venous gas identified. There is no adenopathy. Reproductive: Hysterectomy. Other: Small fat containing umbilical hernia. Musculoskeletal: Osteopenia  with degenerative changes of the spine and hips. No acute fracture. Grade 1 L1-L2 retrolisthesis with near complete disc space height at this level. Grade 1 L5-S1 anterolisthesis. There is a transitional vertebra with lumbarization of the S1. IMPRESSION: No acute intra-abdominopelvic pathology. No evidence of bowel obstruction or active inflammation. Electronically Signed   By: Anner Crete M.D.   On: 01/05/2017 23:06    Time Spent in minutes  30   Noriel Guthrie K M.D on 01/06/2017 at 12:12  PM  Between 7am to 7pm - Pager - [phone]  After 7pm go to [email or web address] - password [facility]  [facility] -  Office  [phone]

## 2017-01-06 NOTE — Evaluation (Signed)
Physical Therapy Evaluation Patient Details Name: Cindy [name] MRN: [id number] DOB: January 23, 1932 Today's Date: 01/06/2017   History of Present Illness  Cindy [name] is a 83 y.o. female with medical history significant of HTN, asthma/COPD, CAD, MI, anemia, Bil TKA, hypothyroidism, and CKD stage III.  She was admitted 01/05/17 with AMS, is found to have sepsis due to UTI and anemia.  Clinical Impression  Patient is functioning at Mod I to Independent level with all mobility and gait.  Good balance during gait.  No acute PT needs identified - PT will sign off.  Encouraged continued ambulation.    Follow Up Recommendations No PT follow up;Supervision - Intermittent    Equipment Recommendations  None recommended by PT    Recommendations for Other Services       Precautions / Restrictions Precautions Precautions: None Restrictions Weight Bearing Restrictions: No      Mobility  Bed Mobility Overal bed mobility: Modified Independent             General bed mobility comments: Increased time  Transfers Overall transfer level: Modified independent Equipment used: None             General transfer comment: Increased time.  Able to move sit > stand without use of UE's.  Ambulation/Gait Ambulation/Gait assistance: Modified independent (Device/Increase time) Ambulation Distance (Feet): 46 Feet Assistive device: None Gait Pattern/deviations: Step-through pattern;Decreased stride length   Gait velocity interpretation: at or above normal speed for age/gender General Gait Details: Patient with good gait pattern, balance, and speed.  Limited distance to stay near bathroom (diarrhea).  Patient ambulated earlier on entire unit with no assistive device..  Stairs            Wheelchair Mobility    Modified Rankin (Stroke Patients Only)       Balance Overall balance assessment: No apparent balance deficits (not formally assessed)         Standing balance support:  No upper extremity supported Standing balance-Leahy Scale: Normal Standing balance comment: Patient with no loss of balance during gait, or with high level balance activities.         Rhomberg - Eyes Opened: 30 Rhomberg - Eyes Closed: 30 (no sway) High level balance activites: Direction changes;Turns;Sudden stops;Head turns High Level Balance Comments: No loss of balance with high level balance activities.             Pertinent Vitals/Pain Pain Assessment: 0-10 Pain Score: 2  Pain Location: Rt low back and hip after longer walks Pain Descriptors / Indicators: Aching Pain Intervention(s): Monitored during session    Home Living Family/patient expects to be discharged to:: Private residence Living Arrangements: Spouse/significant other Available Help at Discharge: Family;Available PRN/intermittently Type of Home: House Home Access: Level entry     Home Layout: One level Home Equipment: Cane - single point;Walker - 2 wheels      Prior Function Level of Independence: Independent;Needs assistance   Gait / Transfers Assistance Needed: Patient ambulates independently.  ADL's / Homemaking Assistance Needed: Some assist with dressing (socks and bra).  Husband does most of the cooking.  Patient can fix meals if needed.  Does light cleaning/laundry.  Has home cleaned 1x/month.  Comments: Patient drives.  Goes to grocery or other store on her own.     Hand Dominance        Extremity/Trunk Assessment   Upper Extremity Assessment Upper Extremity Assessment: RUE deficits/detail RUE Deficits / Details: Prior rotator cuff injury limiting ROM to 80* flex/abd.  Lower Extremity Assessment Lower Extremity Assessment: Overall WFL for tasks assessed       Communication   Communication: No difficulties  Cognition Arousal/Alertness: Awake/alert Behavior During Therapy: WFL for tasks assessed/performed Overall Cognitive Status: Within Functional Limits for tasks assessed                       General Comments      Exercises     Assessment/Plan    PT Assessment Patent does not need any further PT services  PT Problem List         PT Treatment Interventions      PT Goals (Current goals can be found in the Care Plan section)  Acute Rehab PT Goals PT Goal Formulation: All assessment and education complete, DC therapy    Frequency     Barriers to discharge        Co-evaluation               End of Session   Activity Tolerance: Patient tolerated treatment well Patient left: in bed;with call bell/phone within reach;with family/visitor present (sitting EOB) Nurse Communication: Mobility status (No PT needs.) PT Visit Diagnosis: Difficulty in walking, not elsewhere classified (R26.2);Muscle weakness (generalized) (M62.81)         Time: 7414-2395 PT Time Calculation (min) (ACUTE ONLY): 21 min   Charges:   PT Evaluation $PT Eval Moderate Complexity: 1 Procedure     PT G Codes:         Despina Pole January 08, 2017, 7:02 PM Carita Pian. Sanjuana Kava, [place] Pager [phone]

## 2017-01-07 DIAGNOSIS — G9340 Encephalopathy, unspecified: Secondary | ICD-10-CM

## 2017-01-07 DIAGNOSIS — R4182 Altered mental status, unspecified: Secondary | ICD-10-CM

## 2017-01-07 DIAGNOSIS — A419 Sepsis, unspecified organism: Principal | ICD-10-CM

## 2017-01-07 LAB — URINE CULTURE

## 2017-01-07 MED ORDER — CEFPODOXIME PROXETIL 200 MG PO TABS: 200.0000 mg | ORAL_TABLET | Freq: Two times a day (BID) | ORAL | 0 refills | Status: DC

## 2017-01-07 MED ORDER — OXYMETAZOLINE HCL 0.05 % NA SOLN
2.0000 | Freq: Once | NASAL | Status: DC
  Filled 2017-01-07: qty 15

## 2017-01-07 NOTE — Consult Note (Signed)
           [facility] CM Primary Care Navigator  01/07/2017  Cindy [name] 1932-08-05 [id number]   [place] see patient today at the bedside to identify possible discharge needs but staff reports that she was already discharged.  Patient was discharged home earlier today. Primary care provider's office called [name]) to notify of patient's discharge and possible need for post hospital follow-up and transition of care.  Made aware to refer patient to [facility] care management if deemed appropriate for services.  For questions, please contact:  Dannielle Huh, BSN, RN- [facility] Primary Care Navigator  Telephone: [phone] [place]

## 2017-01-07 NOTE — Discharge Instructions (Signed)
Follow with Primary MD Melinda Crutch, MD in 2-3 days, follow final blood culture results that visit.  Get CBC, CMP, 2 view Chest X ray checked  by Primary MD in 2-3 days ( we routinely change or add medications that can affect your baseline labs and fluid status, therefore we recommend that you get the mentioned basic workup next visit with your PCP, your PCP may decide not to get them or add new tests based on their clinical decision)  Activity: As tolerated with Full fall precautions use walker/cane & assistance as needed  Disposition Home    Diet: Heart Healthy   For Heart failure patients - Check your Weight same time everyday, if you gain over 2 pounds, or you develop in leg swelling, experience more shortness of breath or chest pain, call your Primary MD immediately. Follow Cardiac Low Salt Diet and 1.5 lit/day fluid restriction.  On your next visit with your primary care physician please Get Medicines reviewed and adjusted.  Please request your Prim.MD to go over all Hospital Tests and Procedure/Radiological results at the follow up, please get all Hospital records sent to your Prim MD by signing hospital release before you go home.  If you experience worsening of your admission symptoms, develop shortness of breath, life threatening emergency, suicidal or homicidal thoughts you must seek medical attention immediately by calling 911 or calling your MD immediately  if symptoms less severe.  You Must read complete instructions/literature along with all the possible adverse reactions/side effects for all the Medicines you take and that have been prescribed to you. Take any new Medicines after you have completely understood and accpet all the possible adverse reactions/side effects.   Do not drive, operate heavy machinery, perform activities at heights, swimming or participation in water activities or provide baby sitting services if your were admitted for syncope or siezures until you have seen  by Primary MD or a Neurologist and advised to do so again.  Do not drive when taking Pain medications.    Do not take more than prescribed Pain, Sleep and Anxiety Medications  Special Instructions: If you have smoked or chewed Tobacco  in the last 2 yrs please stop smoking, stop any regular Alcohol  and or any Recreational drug use.  Wear Seat belts while driving.   Please note  You were cared for by a hospitalist during your hospital stay. If you have any questions about your discharge medications or the care you received while you were in the hospital after you are discharged, you can call the unit and asked to speak with the hospitalist on call if the hospitalist that took care of you is not available. Once you are discharged, your primary care physician will handle any further medical issues. Please note that NO REFILLS for any discharge medications will be authorized once you are discharged, as it is imperative that you return to your primary care physician (or establish a relationship with a primary care physician if you do not have one) for your aftercare needs so that they can reassess your need for medications and monitor your lab values.

## 2017-01-07 NOTE — Care Management Note (Signed)
Case Management Note  Patient Details  Name: Cindy [name] MRN: [id number] Date of Birth: [birth year]  Subjective/Objective: Pt presented for Sepsis. Plan will be to d/c home today. Pt is not homebound and still drives. Will not qualify for [place] at this time.                    Action/Plan: Pt is refusing HH Services at this time as well. Pt is aware that she can contact her PCP if she needs HH in the future. No further needs.  Expected Discharge Date:  01/07/17               Expected Discharge Plan:  Home/Self Care  In-House Referral:  NA  Discharge planning Services  CM Consult  Post Acute Care Choice:  NA Choice offered to:  NA  DME Arranged:  N/A DME Agency:  NA  HH Arranged:  NA HH Agency:  NA  Status of Service:  Completed, signed off  If discussed at [employer] [place] of Stay Meetings, dates discussed:    Additional Comments:  Bethena Roys, RN 01/07/2017, 1:10 PM

## 2017-01-07 NOTE — Plan of Care (Signed)
Problem: Education: Goal: Knowledge of [facility] Education information/materials will improve Outcome: Completed/Met Date Met: 01/07/17 Pt and family given discharge instructions with understanding

## 2017-01-07 NOTE — Discharge Summary (Signed)
[name] FVC:[id number] DOB: 06-08-1932 DOA: 01/05/2017  PCP: Melinda Crutch, MD  Admit date: 01/05/2017  Discharge date: 01/07/2017  Admitted From: Home   Disposition:  Home   Recommendations for Outpatient Follow-up:   Follow up with PCP in 1-2 weeks  PCP Please obtain BMP/CBC, 2 view CXR in 1week,  (see Discharge instructions)   PCP Please follow up on the following pending results: Follow final blood culture results   Home Health: None   Equipment/Devices: None  Consultations: None Discharge Condition: Stable   CODE STATUS: Full   Diet Recommendation:  Heart Healthy    Chief Complaint  Patient presents with  . Altered Mental Status     Brief history of present illness from the day of admission and additional interim summary    Cindy [name] a 83 y.o.femalewith medical history significant of HTN,asthma/COPD, CAD, hypothyroidism, and CKD stage III; presents after being found acutely disoriented, is found to have sepsis due to UTI in the ER.                                                                 Hospital Course    1.Sepsis due to UTI. Much improved with supportive care which included IV fluids and empiric IV antibiotics, sepsis physiology has completely resolved, She tolerated activity very well, was seen by PT, mild generalized weakness for which I will order home PT. Discussed her blood cultures with micro-lab today -48 hours, we will place her on oral Vantin for a total 10 day course, request PCP to check final blood culture results next visit. He is completely symptom free any good to go home.  2. Toxic encephalopathy. Due to #1 above. Resolved.  3. Essential hypertension. Stable on beta blocker.  4. CK D3. Creatinine close to baseline monitor.  5. History of COPD, asthma and  interstitial lung disease. No acute issues. No wheezing or shortness of breath, continue supportive care with nebulizer treatments and as needed oxygen.  6. Hypothyroidism. Continue home dose Synthroid. TSH stable.  7. CAD. Chest pain-free, no acute issues, continue aspirin and beta blocker for secondary prevention.  8. GERD. On PPI continue.   Discharge diagnosis     Principal Problem:   Sepsis ([place]) Active Problems:   Coronary atherosclerosis of native coronary artery   Thrombocytopenia ([facility])   Lower urinary tract infectious disease   Encephalopathy acute   Hypothyroidism   Anemia   Altered mental status    Discharge instructions    Discharge Instructions    Diet - low sodium heart healthy    Complete by:  As directed    Discharge instructions    Complete by:  As directed    Follow with Primary MD Melinda Crutch, MD in 2-3 days, follow final blood culture results that  visit.  Get CBC, CMP, 2 view Chest X ray checked  by Primary MD in 2-3 days ( we routinely change or add medications that can affect your baseline labs and fluid status, therefore we recommend that you get the mentioned basic workup next visit with your PCP, your PCP may decide not to get them or add new tests based on their clinical decision)  Activity: As tolerated with Full fall precautions use walker/cane & assistance as needed  Disposition Home    Diet: Heart Healthy   For Heart failure patients - Check your Weight same time everyday, if you gain over 2 pounds, or you develop in leg swelling, experience more shortness of breath or chest pain, call your Primary MD immediately. Follow Cardiac Low Salt Diet and 1.5 lit/day fluid restriction.  On your next visit with your primary care physician please Get Medicines reviewed and adjusted.  Please request your Prim.MD to go over all Hospital Tests and Procedure/Radiological results at the follow up, please get all Hospital records sent to your Prim MD by  signing hospital release before you go home.  If you experience worsening of your admission symptoms, develop shortness of breath, life threatening emergency, suicidal or homicidal thoughts you must seek medical attention immediately by calling 911 or calling your MD immediately  if symptoms less severe.  You Must read complete instructions/literature along with all the possible adverse reactions/side effects for all the Medicines you take and that have been prescribed to you. Take any new Medicines after you have completely understood and accpet all the possible adverse reactions/side effects.   Do not drive, operate heavy machinery, perform activities at heights, swimming or participation in water activities or provide baby sitting services if your were admitted for syncope or siezures until you have seen by Primary MD or a Neurologist and advised to do so again.  Do not drive when taking Pain medications.    Do not take more than prescribed Pain, Sleep and Anxiety Medications  Special Instructions: If you have smoked or chewed Tobacco  in the last 2 yrs please stop smoking, stop any regular Alcohol  and or any Recreational drug use.  Wear Seat belts while driving.   Please note  You were cared for by a hospitalist during your hospital stay. If you have any questions about your discharge medications or the care you received while you were in the hospital after you are discharged, you can call the unit and asked to speak with the hospitalist on call if the hospitalist that took care of you is not available. Once you are discharged, your primary care physician will handle any further medical issues. Please note that NO REFILLS for any discharge medications will be authorized once you are discharged, as it is imperative that you return to your primary care physician (or establish a relationship with a primary care physician if you do not have one) for your aftercare needs so that they can reassess  your need for medications and monitor your lab values.   Increase activity slowly    Complete by:  As directed       Discharge Medications   Allergies as of 01/07/2017      Reactions   Codeine Other (See Comments)   Headache from heavy doses of codeine only      Medication List    TAKE these medications   aspirin 81 MG tablet Take 81 mg by mouth daily.   atorvastatin 40 MG tablet Commonly known  as:  LIPITOR Take 40 mg by mouth every evening.   benzonatate 100 MG capsule Commonly known as:  TESSALON Take 1 capsule (100 mg total) by mouth 3 (three) times daily as needed for cough.   CALCIUM 600+D 600-200 MG-UNIT Tabs Generic drug:  Calcium Carbonate-Vitamin D Take 1 tablet by mouth 2 (two) times daily.   cefpodoxime 200 MG tablet Commonly known as:  VANTIN Take 1 tablet (200 mg total) by mouth 2 (two) times daily.   ferrous sulfate 325 (65 FE) MG tablet Take 325 mg by mouth daily with breakfast.   guaiFENesin 600 MG 12 hr tablet Commonly known as:  MUCINEX Take 600 mg by mouth 2 (two) times daily as needed for cough or to loosen phlegm.   isosorbide mononitrate 60 MG 24 hr tablet Commonly known as:  IMDUR Take 1 tablet (60 mg total) by mouth daily.   levothyroxine 150 MCG tablet Commonly known as:  SYNTHROID, LEVOTHROID Take 175 mcg by mouth daily.   losartan 50 MG tablet Commonly known as:  COZAAR Take 50 mg by mouth daily.   metoprolol succinate 50 MG 24 hr tablet Commonly known as:  TOPROL-XL Take 50 mg by mouth daily.   nitroGLYCERIN 0.4 MG SL tablet Commonly known as:  NITROSTAT Place 1 tablet (0.4 mg total) under the tongue every 5 (five) minutes as needed for chest pain.   pantoprazole 40 MG tablet Commonly known as:  PROTONIX TAKE ONE TABLET BY MOUTH TWICE DAILY   PRESERVISION AREDS 2 PO Take 1 capsule by mouth 2 (two) times daily.   spironolactone-hydrochlorothiazide 25-25 MG tablet Commonly known as:  ALDACTAZIDE Take 1 tablet by mouth  daily.   SYSTANE OP Apply 1-2 drops to eye at bedtime.   traMADol 50 MG tablet Commonly known as:  ULTRAM Take 50 mg by mouth 2 (two) times daily. For pain. Maximum dose= 8 tablets per day   ZYRTEC ALLERGY PO Take 10 mg by mouth daily.       Follow-up Information    Melinda Crutch, MD. Schedule an appointment as soon as possible for a visit in 2 day(s).   Specialty:  Family Medicine Why:  Follow final blood culture results that visit Contact information: [place] [address] [phone]           Major procedures and Radiology Reports - PLEASE review detailed and final reports thoroughly  -         Dg Chest 2 View  Result Date: 01/05/2017 CLINICAL DATA:  Reason for exam: AMS, confusion and increased agitation. Medical hx: asthma, HTN, renal insufficiency, bronchitis, CAD, COPD, MI. EXAM: CHEST  2 VIEW COMPARISON:  10/09/2015 FINDINGS: Sternotomy wires overlie normal cardiac silhouette. No effusion, infiltrate pneumothorax. Degenerative osteophytosis of the spine. Mild interstitial pattern similar prior. IMPRESSION: No clear acute findings. Mild chronic bronchitic markings similar prior. Electronically Signed   By: Suzy Bouchard M.D.   On: 01/05/2017 21:58   Ct Abdomen Pelvis W Contrast  Result Date: 01/05/2017 CLINICAL DATA:  83 year old female with altered mental status and abdominal pain. EXAM: CT ABDOMEN AND PELVIS WITH CONTRAST TECHNIQUE: Multidetector CT imaging of the abdomen and pelvis was performed using the standard protocol following bolus administration of intravenous contrast. CONTRAST:  75 cc Isovue 300 COMPARISON:  None. FINDINGS: Lower chest: Mild emphysematous changes of the visualized lung bases with mild chronic interstitial coarsening. No focal consolidation. There is coronary vascular calcification. No intra-abdominal free air or free fluid. Hepatobiliary: No focal  liver abnormality is seen. No gallstones, gallbladder wall thickening, or  biliary dilatation. Pancreas: Unremarkable. No pancreatic ductal dilatation or surrounding inflammatory changes. Spleen: Normal in size without focal abnormality. Adrenals/Urinary Tract: The adrenal glands are unremarkable. The kidneys, visualized ureters, and urinary bladder appear unremarkable as well. There is symmetric uptake and excretion of the intravenous contrast by kidneys. Stomach/Bowel: There is thickened appearance of the proximal stomach likely related to underdistention. No associated inflammatory changes. There is no evidence of bowel obstruction or active inflammation. Multiple normal caliber fecalized loops of distal small bowel, likely representing chronic stasis and increased transit time. There is no evidence of bowel obstruction or active inflammation. Appendectomy. Vascular/Lymphatic: There is advanced aortoiliac atherosclerotic disease. The origins of the celiac axis, SMA, IMA as well as the origins of the renal arteries appear patent. The SMV, splenic vein, and main portal vein are patent. No portal venous gas identified. There is no adenopathy. Reproductive: Hysterectomy. Other: Small fat containing umbilical hernia. Musculoskeletal: Osteopenia with degenerative changes of the spine and hips. No acute fracture. Grade 1 L1-L2 retrolisthesis with near complete disc space height at this level. Grade 1 L5-S1 anterolisthesis. There is a transitional vertebra with lumbarization of the S1. IMPRESSION: No acute intra-abdominopelvic pathology. No evidence of bowel obstruction or active inflammation. Electronically Signed   By: Anner Crete M.D.   On: 01/05/2017 23:06    Micro Results     Recent Results (from the past 240 hour(s))  Blood Culture (routine x 2)     Status: None (Preliminary result)   Collection Time: 01/05/17  8:33 PM  Result Value Ref Range Status   Specimen Description BLOOD RIGHT WRIST  Final   Special Requests IN PEDIATRIC BOTTLE [place]  Final   Culture NO GROWTH < 24  HOURS  Final   Report Status PENDING  Incomplete  Blood Culture (routine x 2)     Status: None (Preliminary result)   Collection Time: 01/05/17  8:50 PM  Result Value Ref Range Status   Specimen Description BLOOD RIGHT HAND  Final   Special Requests IN PEDIATRIC BOTTLE 3CC  Final   Culture NO GROWTH < 24 HOURS  Final   Report Status PENDING  Incomplete    Today   Subjective    Cindy [name] today has no headache,no chest abdominal pain,no new weakness tingling or numbness, feels much better wants to go home today.     Objective   Blood pressure (!) 177/45, pulse 78, temperature 98.4 F (36.9 C), temperature source Oral, resp. rate 18, height 5\' 1"  (1.549 m), weight 84.9 kg (187 lb 3.2 oz), SpO2 99 %.   Intake/Output Summary (Last 24 hours) at 01/07/17 1044 Last data filed at 01/07/17 0832  Gross per 24 hour  Intake           1087.5 ml  Output              350 ml  Net            737.5 ml    Exam Awake Alert, Oriented x 3, No new F.N deficits, Normal affect .AT,PERRAL Supple Neck,No JVD, No cervical lymphadenopathy appriciated.  Symmetrical Chest wall movement, Good air movement bilaterally, CTAB RRR,No Gallops,Rubs or new Murmurs, No Parasternal Heave +ve B.Sounds, Abd Soft, Non tender, No organomegaly appriciated, No rebound -guarding or rigidity. No Cyanosis, Clubbing or edema, No new Rash or bruise   Data Review   CBC w Diff: Lab Results  Component Value Date  WBC 6.5 01/06/2017   HGB 10.3 (L) 01/06/2017   HGB 12.9 12/24/2012   HCT 30.6 (L) 01/06/2017   HCT 36.6 12/24/2012   PLT 125 (L) 01/06/2017   PLT 180 12/24/2012   LYMPHOPCT 12 01/05/2017   LYMPHOPCT 31.2 12/24/2012   MONOPCT 10 01/05/2017   MONOPCT 9.9 12/24/2012   EOSPCT 3 01/05/2017   EOSPCT 5.9 12/24/2012   BASOPCT 0 01/05/2017   BASOPCT 0.8 12/24/2012    CMP: Lab Results  Component Value Date   NA 139 01/06/2017   NA 139 12/24/2012   K 3.9 01/06/2017   K 4.4 12/24/2012   CL 109  01/06/2017   CL 102 12/24/2012   CO2 24 01/06/2017   CO2 28 12/24/2012   BUN 24 (H) 01/06/2017   BUN 16.5 12/24/2012   CREATININE 1.32 (H) 01/06/2017   CREATININE 1.1 12/24/2012   PROT 6.3 (L) 01/05/2017   PROT 7.2 12/24/2012   ALBUMIN 3.7 01/05/2017   ALBUMIN 3.8 12/24/2012   BILITOT 0.4 01/05/2017   BILITOT 0.33 12/24/2012   ALKPHOS 49 01/05/2017   ALKPHOS 82 12/24/2012   AST 24 01/05/2017   AST 22 12/24/2012   ALT 15 01/05/2017   ALT 15 12/24/2012  .   Total Time in preparing paper work, data evaluation and todays exam - 35 minutes  Thurnell Lose M.D on 01/07/2017 at 10:44 AM  [facility]   Office  [phone]

## 2017-01-09 DIAGNOSIS — E039 Hypothyroidism, unspecified: Secondary | ICD-10-CM | POA: Diagnosis not present

## 2017-01-09 DIAGNOSIS — A419 Sepsis, unspecified organism: Secondary | ICD-10-CM | POA: Diagnosis not present

## 2017-01-09 DIAGNOSIS — M1990 Unspecified osteoarthritis, unspecified site: Secondary | ICD-10-CM | POA: Diagnosis not present

## 2017-01-09 DIAGNOSIS — G894 Chronic pain syndrome: Secondary | ICD-10-CM | POA: Diagnosis not present

## 2017-01-09 DIAGNOSIS — Z09 Encounter for follow-up examination after completed treatment for conditions other than malignant neoplasm: Secondary | ICD-10-CM | POA: Diagnosis not present

## 2017-01-09 DIAGNOSIS — M549 Dorsalgia, unspecified: Secondary | ICD-10-CM | POA: Diagnosis not present

## 2017-01-10 LAB — CULTURE, BLOOD (ROUTINE X 2)
CULTURE: NO GROWTH
Culture: NO GROWTH

## 2017-01-19 ENCOUNTER — Emergency Department (HOSPITAL_COMMUNITY)
Admission: EM | Admit: 2017-01-19 | Discharge: 2017-01-19 | Disposition: A | Discharge status: Home / Self Care | Payer: Medicare Other | Attending: Emergency Medicine | Admitting: Emergency Medicine

## 2017-01-19 ENCOUNTER — Encounter (HOSPITAL_COMMUNITY): Payer: Self-pay | Admitting: Emergency Medicine

## 2017-01-19 DIAGNOSIS — I10 Essential (primary) hypertension: Secondary | ICD-10-CM | POA: Insufficient documentation

## 2017-01-19 DIAGNOSIS — I252 Old myocardial infarction: Secondary | ICD-10-CM | POA: Diagnosis not present

## 2017-01-19 DIAGNOSIS — Z7982 Long term (current) use of aspirin: Secondary | ICD-10-CM | POA: Diagnosis not present

## 2017-01-19 DIAGNOSIS — Z951 Presence of aortocoronary bypass graft: Secondary | ICD-10-CM | POA: Insufficient documentation

## 2017-01-19 DIAGNOSIS — I2510 Atherosclerotic heart disease of native coronary artery without angina pectoris: Secondary | ICD-10-CM | POA: Insufficient documentation

## 2017-01-19 DIAGNOSIS — Z87891 Personal history of nicotine dependence: Secondary | ICD-10-CM | POA: Diagnosis not present

## 2017-01-19 DIAGNOSIS — Z79899 Other long term (current) drug therapy: Secondary | ICD-10-CM | POA: Diagnosis not present

## 2017-01-19 DIAGNOSIS — J449 Chronic obstructive pulmonary disease, unspecified: Secondary | ICD-10-CM | POA: Insufficient documentation

## 2017-01-19 DIAGNOSIS — E039 Hypothyroidism, unspecified: Secondary | ICD-10-CM | POA: Diagnosis not present

## 2017-01-19 DIAGNOSIS — Z955 Presence of coronary angioplasty implant and graft: Secondary | ICD-10-CM | POA: Diagnosis not present

## 2017-01-19 DIAGNOSIS — R040 Epistaxis: Secondary | ICD-10-CM | POA: Insufficient documentation

## 2017-01-19 LAB — CBC
HEMATOCRIT: 34.6 % — AB (ref 36.0–46.0)
Hemoglobin: 11.8 g/dL — ABNORMAL LOW (ref 12.0–15.0)
MCH: 31.6 pg (ref 26.0–34.0)
MCHC: 34.1 g/dL (ref 30.0–36.0)
MCV: 92.5 fL (ref 78.0–100.0)
Platelets: 170 10*3/uL (ref 150–400)
RBC: 3.74 MIL/uL — ABNORMAL LOW (ref 3.87–5.11)
RDW: 12.5 % (ref 11.5–15.5)
WBC: 6.1 10*3/uL (ref 4.0–10.5)

## 2017-01-19 MED ORDER — FLUTICASONE PROPIONATE 50 MCG/ACT NA SUSP: 1.0000 | Freq: Every day | NASAL | 0 refills | Status: DC

## 2017-01-19 MED ORDER — TRANEXAMIC ACID 1000 MG/10ML IV SOLN
500.0000 mg | Freq: Once | INTRAVENOUS | Status: AC
  Administered 2017-01-19: 500 mg via TOPICAL
  Filled 2017-01-19: qty 10

## 2017-01-19 NOTE — ED Notes (Signed)
Pt ambulated to bathroom 

## 2017-01-19 NOTE — ED Triage Notes (Signed)
Pt states she blew her nose around 2330 tonight and L nostril started bleeding.  Pt tried to blow her nose again and spray Afrin to get it to stop without success.  States nose started bleeding 2 weeks ago while being discharged from hospital and they had told her if it started bleeding again to blow it hard and then use nasal spray.  Still bleeding at present.

## 2017-01-19 NOTE — ED Notes (Signed)
ED Provider at bedside. 

## 2017-01-19 NOTE — ED Notes (Signed)
Pt departed in NAD, refused use of wheelchair.  

## 2017-01-19 NOTE — ED Provider Notes (Signed)
[facility] DEPT Provider Note   CSN: [id number] Arrival date & time: 01/19/17  0019   By signing my name below, I, Delton Prairie, attest that this documentation has been prepared under the direction and in the presence of Orpah Greek, MD  Electronically Signed: Delton Prairie, ED Scribe. 01/19/17. 1:09 AM.   History   Chief Complaint Chief Complaint  Patient presents with  . Epistaxis    HPI Comments:  Cindy [name] is a 83 y.o. female, with a PMHx of COPD and HTN, who presents to the Emergency Department complaining of acute onset, moderate nosebleed from her left nostril which began around 11:30 yesterday. Pt states she blew her nose and used Afrin with no significant relief. She notes she experienced a similar episode about 2 weeks ago. Pt denies any associated symptoms or any other modifying factors. No other complaints noted.   The history is provided by the patient. No language interpreter was used.    Past Medical History:  Diagnosis Date  . Asthma   . Bronchitis   . COPD (chronic obstructive pulmonary disease) ([place])   . Coronary artery disease    MI age 89  . GERD (gastroesophageal reflux disease)   . Heart attack   . Hypercholesteremia   . Hypertension   . Iron deficiency anemia    used to see hem in [place], [place].   [name] Renal insufficiency   . Thyroid disease     Patient Active Problem List   Diagnosis Date Noted  . Altered mental status   . Sepsis ([place]) 01/06/2017  . Thrombocytopenia ([place]) 01/06/2017  . Lower urinary tract infectious disease 01/06/2017  . Encephalopathy acute 01/06/2017  . Hypothyroidism 01/06/2017  . Anemia 01/06/2017  . Umbilical hernia 67/61/9509  . Coronary atherosclerosis of native coronary artery 12/11/2013  . Old MI (myocardial infarction) 12/11/2013  . History of tobacco use 12/11/2013  . Angina decubitus ([place]) 12/11/2013  . Hyperlipidemia 12/11/2013  . Rhinitis 09/16/2012  . ILD (interstitial lung disease) ([place])  05/08/2012  . Pulmonary nodule, right 05/06/2012  . Cough 03/20/2012  . Iron deficiency anemia   . Renal insufficiency     Past Surgical History:  Procedure Laterality Date  . APPENDECTOMY    . BREAST BIOPSY Right    benign  . CORONARY ANGIOPLASTY WITH STENT PLACEMENT    . CORONARY ARTERY BYPASS GRAFT  09/1979  . Moh's Surgeries     for squamous cell carcinoma  . PARTIAL HYSTERECTOMY  10/1978    OB History    No data available       Home Medications    Prior to Admission medications   Medication Sig Start Date End Date Taking? Authorizing Provider  aspirin 81 MG tablet Take 81 mg by mouth daily.     Historical Provider, MD  atorvastatin (LIPITOR) 40 MG tablet Take 40 mg by mouth every evening.  12/29/15   Historical Provider, MD  benzonatate (TESSALON) 100 MG capsule Take 1 capsule (100 mg total) by mouth 3 (three) times daily as needed for cough. 03/27/16   Rigoberto Noel, MD  Calcium Carbonate-Vitamin D (CALCIUM 600+D) 600-200 MG-UNIT TABS Take 1 tablet by mouth 2 (two) times daily.    Historical Provider, MD  cefpodoxime (VANTIN) 200 MG tablet Take 1 tablet (200 mg total) by mouth 2 (two) times daily. 01/07/17   Thurnell Lose, MD  Cetirizine HCl (ZYRTEC ALLERGY PO) Take 10 mg by mouth daily.    Historical Provider, MD  ferrous  sulfate 325 (65 FE) MG tablet Take 325 mg by mouth daily with breakfast.    Historical Provider, MD  guaiFENesin (MUCINEX) 600 MG 12 hr tablet Take 600 mg by mouth 2 (two) times daily as needed for cough or to loosen phlegm.    Historical Provider, MD  isosorbide mononitrate (IMDUR) 60 MG 24 hr tablet Take 1 tablet (60 mg total) by mouth daily. 12/10/13   Jerline Pain, MD  levothyroxine (SYNTHROID, LEVOTHROID) 150 MCG tablet Take 175 mcg by mouth daily.     Historical Provider, MD  losartan (COZAAR) 50 MG tablet Take 50 mg by mouth daily.    Historical Provider, MD  metoprolol (TOPROL-XL) 50 MG 24 hr tablet Take 50 mg by mouth daily.      Historical  Provider, MD  Multiple Vitamins-Minerals (PRESERVISION AREDS 2 PO) Take 1 capsule by mouth 2 (two) times daily.    Historical Provider, MD  nitroGLYCERIN (NITROSTAT) 0.4 MG SL tablet Place 1 tablet (0.4 mg total) under the tongue every 5 (five) minutes as needed for chest pain. 07/22/14   Jerline Pain, MD  pantoprazole (PROTONIX) 40 MG tablet TAKE ONE TABLET BY MOUTH TWICE DAILY 09/25/16   Rigoberto Noel, MD  Polyethyl Glycol-Propyl Glycol (SYSTANE OP) Apply 1-2 drops to eye at bedtime.    Historical Provider, MD  spironolactone-hydrochlorothiazide (ALDACTAZIDE) 25-25 MG per tablet Take 1 tablet by mouth daily.      Historical Provider, MD  traMADol (ULTRAM) 50 MG tablet Take 50 mg by mouth 2 (two) times daily. For pain. Maximum dose= 8 tablets per day    Historical Provider, MD    Family History Family History  Problem Relation Age of Onset  . Melanoma Sister     x2  . Heart disease Father   . Heart disease Mother     Social History Social History  Substance Use Topics  . Smoking status: Former Smoker    Packs/day: 1.50    Years: 22.00    Types: Cigarettes    Quit date: 10/23/1979  . Smokeless tobacco: Never Used  . Alcohol use Yes     Comment: occasional     Allergies   Codeine   Review of Systems Review of Systems  HENT: Positive for nosebleeds.   Neurological: Negative for light-headedness.  All other systems reviewed and are negative.   Physical Exam Updated Vital Signs BP (!) 194/66 (BP Location: Left Arm)   Pulse 68   Resp 18   LMP  (LMP Unknown)   SpO2 99%   Physical Exam  Constitutional: She is oriented to person, place, and time. She appears well-developed and well-nourished. No distress.  HENT:  Head: Normocephalic and atraumatic.  Right Ear: Hearing normal.  Left Ear: Hearing normal.  Nose: Nose normal.  Mouth/Throat: Oropharynx is clear and moist and mucous membranes are normal.  Small blood clot to anterior left nare without any active  bleeding.  Abnormally dilated vessels distal anterior nasal septal mucosa    Eyes: Conjunctivae and EOM are normal. Pupils are equal, round, and reactive to light.  Neck: Normal range of motion. Neck supple.  Cardiovascular: Regular rhythm, S1 normal and S2 normal.  Exam reveals no gallop and no friction rub.   No murmur heard. Pulmonary/Chest: Effort normal and breath sounds normal. No respiratory distress. She exhibits no tenderness.  Abdominal: Soft. Normal appearance and bowel sounds are normal. There is no hepatosplenomegaly. There is no tenderness. There is no rebound, no guarding, no tenderness  at McBurney's point and negative Murphy's sign. No hernia.  Musculoskeletal: Normal range of motion.  Neurological: She is alert and oriented to person, place, and time. She has normal strength. No cranial nerve deficit or sensory deficit. Coordination normal. GCS eye subscore is 4. GCS verbal subscore is 5. GCS motor subscore is 6.  Skin: Skin is warm, dry and intact. No rash noted. No cyanosis.  Psychiatric: She has a normal mood and affect. Her speech is normal and behavior is normal. Thought content normal.  Nursing note and vitals reviewed.    ED Treatments / Results  DIAGNOSTIC STUDIES:  Oxygen Saturation is 99% on RA, normal by my interpretation.    COORDINATION OF CARE:  1:06 AM Discussed treatment plan with pt at bedside and pt agreed to plan.  Labs (all labs ordered are listed, but only abnormal results are displayed) Labs Reviewed  CBC - Abnormal; Notable for the following:       Result Value   RBC 3.74 (*)    Hemoglobin 11.8 (*)    HCT 34.6 (*)    All other components within normal limits    EKG  EKG Interpretation None       Radiology No results found.  Procedures .Epistaxis Management Date/Time: 01/19/2017 2:39 AM Performed by: Orpah Greek Authorized by: Orpah Greek   Consent:    Consent obtained:  Verbal   Consent given by:   Patient   Risks discussed:  Bleeding and pain   Alternatives discussed:  No treatment Universal protocol:    Procedure explained and questions answered to patient or proxy's satisfaction: yes     Site/side marked: yes     Time out called: yes     Patient identity confirmed:  Verbally with patient Anesthesia (see MAR for exact dosages):    Anesthesia method:  Topical application   Topical anesthesia: 4% Lidocaine. Procedure details:    Treatment site:  L anterior   Treatment method:  Silver nitrate   Treatment complexity:  Limited   Treatment episode: initial   Post-procedure details:    Assessment:  Bleeding stopped   Patient tolerance of procedure:  Tolerated well, no immediate complications   (including critical care time)  Medications Ordered in ED Medications  tranexamic acid (CYKLOKAPRON) injection 500 mg (500 mg Topical Given 01/19/17 0139)     Initial Impression / Assessment and Plan / ED Course  I have reviewed the triage vital signs and the nursing notes.  Pertinent labs & imaging results that were available during my care of the patient were reviewed by me and considered in my medical decision making (see chart for details).     Patient presents with complaints of nosebleed. She has had previous nosebleed approximately 2 weeks ago that did not require any significant intervention. Reviewing her records reveals that she does have history of mild thrombocytopenia. Platelets today, however, are normal. Patient did not have active bleeding at arrival. Examination did reveal small area of abnormal vasculature on the anterior nasal septum that was treated with silver nitrate.  Final Clinical Impressions(s) / ED Diagnoses   Final diagnoses:  Epistaxis    New Prescriptions New Prescriptions   No medications on file  I personally performed the services described in this documentation, which was scribed in my presence. The recorded information has been reviewed and is  accurate.     Orpah Greek, MD 01/19/17 [phone]

## 2017-01-24 DIAGNOSIS — G894 Chronic pain syndrome: Secondary | ICD-10-CM | POA: Diagnosis not present

## 2017-01-24 DIAGNOSIS — E039 Hypothyroidism, unspecified: Secondary | ICD-10-CM | POA: Diagnosis not present

## 2017-01-24 DIAGNOSIS — A419 Sepsis, unspecified organism: Secondary | ICD-10-CM | POA: Diagnosis not present

## 2017-01-24 DIAGNOSIS — M549 Dorsalgia, unspecified: Secondary | ICD-10-CM | POA: Diagnosis not present

## 2017-01-24 DIAGNOSIS — Z09 Encounter for follow-up examination after completed treatment for conditions other than malignant neoplasm: Secondary | ICD-10-CM | POA: Diagnosis not present

## 2017-01-24 DIAGNOSIS — M1990 Unspecified osteoarthritis, unspecified site: Secondary | ICD-10-CM | POA: Diagnosis not present

## 2017-03-22 DIAGNOSIS — L708 Other acne: Secondary | ICD-10-CM | POA: Diagnosis not present

## 2017-03-22 DIAGNOSIS — L570 Actinic keratosis: Secondary | ICD-10-CM | POA: Diagnosis not present

## 2017-03-22 DIAGNOSIS — X32XXXD Exposure to sunlight, subsequent encounter: Secondary | ICD-10-CM | POA: Diagnosis not present

## 2017-04-02 ENCOUNTER — Other Ambulatory Visit: Payer: Self-pay | Admitting: Pulmonary Disease

## 2017-04-08 ENCOUNTER — Telehealth: Payer: Self-pay | Admitting: Pulmonary Disease

## 2017-04-08 NOTE — Telephone Encounter (Signed)
Called and spoke with pt and she is aware that she will need to be seen for further refills.  Pt is scheduled with TP on July 12 at 4:30.  Nothing further is needed.

## 2017-04-17 DIAGNOSIS — H16103 Unspecified superficial keratitis, bilateral: Secondary | ICD-10-CM | POA: Diagnosis not present

## 2017-04-17 DIAGNOSIS — H353121 Nonexudative age-related macular degeneration, left eye, early dry stage: Secondary | ICD-10-CM | POA: Diagnosis not present

## 2017-04-17 DIAGNOSIS — H5712 Ocular pain, left eye: Secondary | ICD-10-CM | POA: Diagnosis not present

## 2017-04-17 DIAGNOSIS — H43391 Other vitreous opacities, right eye: Secondary | ICD-10-CM | POA: Diagnosis not present

## 2017-04-17 DIAGNOSIS — H53021 Refractive amblyopia, right eye: Secondary | ICD-10-CM | POA: Diagnosis not present

## 2017-04-17 DIAGNOSIS — H353112 Nonexudative age-related macular degeneration, right eye, intermediate dry stage: Secondary | ICD-10-CM | POA: Diagnosis not present

## 2017-04-17 DIAGNOSIS — H35033 Hypertensive retinopathy, bilateral: Secondary | ICD-10-CM | POA: Diagnosis not present

## 2017-05-02 ENCOUNTER — Ambulatory Visit (INDEPENDENT_AMBULATORY_CARE_PROVIDER_SITE_OTHER): Payer: Medicare Other | Admitting: Adult Health

## 2017-05-02 ENCOUNTER — Encounter: Payer: Self-pay | Admitting: Adult Health

## 2017-05-02 DIAGNOSIS — J849 Interstitial pulmonary disease, unspecified: Secondary | ICD-10-CM | POA: Diagnosis not present

## 2017-05-02 DIAGNOSIS — R05 Cough: Secondary | ICD-10-CM | POA: Diagnosis not present

## 2017-05-02 DIAGNOSIS — R059 Cough, unspecified: Secondary | ICD-10-CM

## 2017-05-02 DIAGNOSIS — I2510 Atherosclerotic heart disease of native coronary artery without angina pectoris: Secondary | ICD-10-CM

## 2017-05-02 MED ORDER — PANTOPRAZOLE SODIUM 40 MG PO TBEC: 40.0000 mg | DELAYED_RELEASE_TABLET | Freq: Two times a day (BID) | ORAL | 3 refills | Status: DC

## 2017-05-02 NOTE — Assessment & Plan Note (Signed)
Cont on current regimen  gERD control with PPI

## 2017-05-02 NOTE — Progress Notes (Signed)
@Patient  ID: Cindy [name], female    DOB: 1931/11/30, 83 y.o.   MRN: [id number]  Chief Complaint  Patient presents with  . Follow-up    ILD     Referring provider: Lawerance Cruel, MD  HPI: 83 year old female former smoker followed for mild ILD, chronic cough and stable pulmonary nodules.   Significant tests/ events  HRCT 03/2012 ILD pattern is nonspecific, and could represent either early usual interstitial pneumonia (UIP), or nonspecific interstitial pneumonia (NSIP). 29mm RLL nodule  PFTs 03/2012 no airway obstruction, preserved lung volumes, isolated decrease in DLCO to 56% corrects for [place].   Spirometry 04/2013 - preserved lung function w/ no airflow obstruction.  CT chest 06/2013 Unchanged nodules & scarring  CT chest 07/19/14 -mild subpleural fibroiss unchanged from 03/2012. Scattered pulmonary nodules are unchanged (considered benign with 2 yr serial follow up ) .   05/02/2017 Follow up: ILD Albin Fischer /GERD  Patient presents for one-year follow-up. Patient says overall her breathing has been doing good since last year. She denies any increased cough or shortness of breath. She had a chest x-ray in March that showed no acute changes.  She denies any chest pain, orthopnea, PND, or increased leg swelling She gets a little winded with walking long periods of time. Otherwise she denies any shortness of breath at rest.  Patient is somewhat tearful that her sister recently died from metastatic lung cancer Allergies  Allergen Reactions  . Codeine Other (See Comments)    Headache from heavy doses of codeine only    Immunization History  Administered Date(s) Administered  . Influenza Split 07/22/2013  . Influenza Whole 09/15/2012  . Influenza,inj,Quad PF,36+ Mos 07/23/2014  . Influenza-Unspecified 08/23/2015  . Pneumococcal Conjugate-13 08/23/2011  . Tdap 09/15/2012    Past Medical History:  Diagnosis Date  . Asthma   . Bronchitis   . COPD (chronic obstructive  pulmonary disease) ([place])   . Coronary artery disease    MI age 25  . GERD (gastroesophageal reflux disease)   . Heart attack ([place])   . Hypercholesteremia   . Hypertension   . Iron deficiency anemia    used to see hem in [place], [place].   [name] Renal insufficiency   . Thyroid disease     Tobacco History: History  Smoking Status  . Former Smoker  . Packs/day: 1.50  . Years: 22.00  . Types: Cigarettes  . Quit date: 10/23/1979  Smokeless Tobacco  . Never Used   Counseling given: Not Answered   Outpatient Encounter Prescriptions as of 05/02/2017  Medication Sig  . aspirin 81 MG tablet Take 81 mg by mouth daily.   [name] atorvastatin (LIPITOR) 40 MG tablet Take 40 mg by mouth every evening.   . benzonatate (TESSALON) 100 MG capsule Take 1 capsule (100 mg total) by mouth 3 (three) times daily as needed for cough.  . Calcium Carbonate-Vitamin D (CALCIUM 600+D) 600-200 MG-UNIT TABS Take 1 tablet by mouth 2 (two) times daily.  . Cetirizine HCl (ZYRTEC ALLERGY PO) Take 10 mg by mouth daily.  . ferrous sulfate 325 (65 FE) MG tablet Take 325 mg by mouth daily with breakfast.  . guaiFENesin (MUCINEX) 600 MG 12 hr tablet Take 600 mg by mouth 2 (two) times daily as needed for cough or to loosen phlegm.  . isosorbide mononitrate (IMDUR) 60 MG 24 hr tablet Take 1 tablet (60 mg total) by mouth daily.  [name] levothyroxine (SYNTHROID, LEVOTHROID) 150 MCG tablet Take 175 mcg by mouth daily.   [name]  losartan (COZAAR) 50 MG tablet Take 50 mg by mouth daily.  . metoprolol (TOPROL-XL) 50 MG 24 hr tablet Take 50 mg by mouth daily.    . Multiple Vitamins-Minerals (PRESERVISION AREDS 2 PO) Take 1 capsule by mouth 2 (two) times daily.  . nitroGLYCERIN (NITROSTAT) 0.4 MG SL tablet Place 1 tablet (0.4 mg total) under the tongue every 5 (five) minutes as needed for chest pain.  . pantoprazole (PROTONIX) 40 MG tablet Take 1 tablet (40 mg total) by mouth 2 (two) times daily.  Vladimir Faster Glycol-Propyl Glycol (SYSTANE OP) Apply 1-2  drops to eye at bedtime.  [name] spironolactone-hydrochlorothiazide (ALDACTAZIDE) 25-25 MG per tablet Take 1 tablet by mouth daily.    . traMADol (ULTRAM) 50 MG tablet Take 50 mg by mouth 2 (two) times daily. For pain. Maximum dose= 8 tablets per day  . [DISCONTINUED] cefpodoxime (VANTIN) 200 MG tablet Take 1 tablet (200 mg total) by mouth 2 (two) times daily.  . [DISCONTINUED] fluticasone (FLONASE) 50 MCG/ACT nasal spray Place 1 spray into both nostrils daily.  . [DISCONTINUED] pantoprazole (PROTONIX) 40 MG tablet TAKE ONE TABLET BY MOUTH TWICE DAILY   No facility-administered encounter medications on file as of 05/02/2017.      Review of Systems  Constitutional:   No  weight loss, night sweats,  Fevers, chills, + fatigue, or  lassitude.  HEENT:   No headaches,  Difficulty swallowing,  Tooth/dental problems, or  Sore throat,                No sneezing, itching, ear ache, nasal congestion, post nasal drip,   CV:  No chest pain,  Orthopnea, PND, swelling in lower extremities, anasarca, dizziness, palpitations, syncope.   GI  No heartburn, indigestion, abdominal pain, nausea, vomiting, diarrhea, change in bowel habits, loss of appetite, bloody stools.   Resp:  No chest wall deformity  Skin: no rash or lesions.  GU: no dysuria, change in color of urine, no urgency or frequency.  No flank pain, no hematuria   MS:  No joint pain or swelling.  No decreased range of motion.  No back pain.    Physical Exam  BP 120/60 (BP Location: Left Arm, Cuff Size: Normal)   Pulse 64   Ht 5' 1.5" (1.562 m)   Wt 183 lb 3.2 oz (83.1 kg)   LMP  (LMP Unknown)   SpO2 96%   BMI 34.05 kg/m   GEN: A/Ox3; pleasant , NAD, elderly    HEENT:  [place]/AT,  EACs-clear, TMs-wnl, NOSE-clear, THROAT-clear, no lesions, no postnasal drip or exudate noted.   NECK:  Supple w/ fair ROM; no JVD; normal carotid impulses w/o bruits; no thyromegaly or nodules palpated; no lymphadenopathy.    RESP  Clear  P & A; w/o, wheezes/  rales/ or rhonchi. no accessory muscle use, no dullness to percussion  CARD:  RRR, no m/r/g, no peripheral edema, pulses intact, no cyanosis or clubbing.  GI:   Soft & nt; nml bowel sounds; no organomegaly or masses detected.   Musco: Warm bil, no deformities or joint swelling noted.   Neuro: alert, no focal deficits noted.    Skin: Warm, no lesions or rashes     Lab Results:  CBC  BMET  BNP No results found for: BNP  ProBNP No results found for: PROBNP  Imaging: No results found.   Assessment & Plan:   ILD (interstitial lung disease) Mild ILD w/ no progression on CT chest in 2015.  Sx are tolerable  without flare   Plan  Cont to monitor .    Cough Cont on current regimen  gERD control with PPI       Rexene Edison, NP 05/02/2017

## 2017-05-02 NOTE — Patient Instructions (Addendum)
Continue current regimen .  Follow up with Dr. Elsworth Soho  In  1 year and As needed

## 2017-05-02 NOTE — Assessment & Plan Note (Signed)
Mild ILD w/ no progression on CT chest in 2015.  Sx are tolerable without flare   Plan  Cont to monitor .

## 2017-05-03 NOTE — Progress Notes (Signed)
Reviewed & agree with plan  

## 2017-05-07 ENCOUNTER — Other Ambulatory Visit: Payer: Self-pay | Admitting: Cardiology

## 2017-05-07 MED ORDER — NITROGLYCERIN 0.4 MG SL SUBL: 0.4000 mg | SUBLINGUAL_TABLET | SUBLINGUAL | 1 refills | Status: DC | PRN

## 2017-05-07 NOTE — Telephone Encounter (Signed)
Pt's medication was sent to pt's pharmacy as requested. Confirmation received.  °

## 2017-05-08 ENCOUNTER — Other Ambulatory Visit: Payer: Self-pay | Admitting: Pulmonary Disease

## 2017-05-10 DIAGNOSIS — E039 Hypothyroidism, unspecified: Secondary | ICD-10-CM | POA: Diagnosis not present

## 2017-06-14 DIAGNOSIS — J329 Chronic sinusitis, unspecified: Secondary | ICD-10-CM | POA: Diagnosis not present

## 2017-06-14 DIAGNOSIS — R05 Cough: Secondary | ICD-10-CM | POA: Diagnosis not present

## 2017-06-14 DIAGNOSIS — Z23 Encounter for immunization: Secondary | ICD-10-CM | POA: Diagnosis not present

## 2017-06-14 DIAGNOSIS — J309 Allergic rhinitis, unspecified: Secondary | ICD-10-CM | POA: Diagnosis not present

## 2017-07-30 DIAGNOSIS — I10 Essential (primary) hypertension: Secondary | ICD-10-CM | POA: Diagnosis not present

## 2017-07-30 DIAGNOSIS — E039 Hypothyroidism, unspecified: Secondary | ICD-10-CM | POA: Diagnosis not present

## 2017-07-30 DIAGNOSIS — E785 Hyperlipidemia, unspecified: Secondary | ICD-10-CM | POA: Diagnosis not present

## 2017-07-30 DIAGNOSIS — N189 Chronic kidney disease, unspecified: Secondary | ICD-10-CM | POA: Diagnosis not present

## 2017-08-02 DIAGNOSIS — I10 Essential (primary) hypertension: Secondary | ICD-10-CM | POA: Diagnosis not present

## 2017-08-02 DIAGNOSIS — I6529 Occlusion and stenosis of unspecified carotid artery: Secondary | ICD-10-CM | POA: Diagnosis not present

## 2017-08-02 DIAGNOSIS — N189 Chronic kidney disease, unspecified: Secondary | ICD-10-CM | POA: Diagnosis not present

## 2017-08-02 DIAGNOSIS — R29898 Other symptoms and signs involving the musculoskeletal system: Secondary | ICD-10-CM | POA: Diagnosis not present

## 2017-08-02 DIAGNOSIS — I209 Angina pectoris, unspecified: Secondary | ICD-10-CM | POA: Diagnosis not present

## 2017-08-02 DIAGNOSIS — E785 Hyperlipidemia, unspecified: Secondary | ICD-10-CM | POA: Diagnosis not present

## 2017-08-02 DIAGNOSIS — E039 Hypothyroidism, unspecified: Secondary | ICD-10-CM | POA: Diagnosis not present

## 2017-08-02 DIAGNOSIS — G894 Chronic pain syndrome: Secondary | ICD-10-CM | POA: Diagnosis not present

## 2017-08-02 DIAGNOSIS — D638 Anemia in other chronic diseases classified elsewhere: Secondary | ICD-10-CM | POA: Diagnosis not present

## 2017-08-02 DIAGNOSIS — I2510 Atherosclerotic heart disease of native coronary artery without angina pectoris: Secondary | ICD-10-CM | POA: Diagnosis not present

## 2017-08-02 DIAGNOSIS — Z0000 Encounter for general adult medical examination without abnormal findings: Secondary | ICD-10-CM | POA: Diagnosis not present

## 2017-08-05 ENCOUNTER — Other Ambulatory Visit: Payer: Self-pay | Admitting: Family Medicine

## 2017-08-05 DIAGNOSIS — I6522 Occlusion and stenosis of left carotid artery: Secondary | ICD-10-CM

## 2017-08-09 DIAGNOSIS — X32XXXD Exposure to sunlight, subsequent encounter: Secondary | ICD-10-CM | POA: Diagnosis not present

## 2017-08-09 DIAGNOSIS — L570 Actinic keratosis: Secondary | ICD-10-CM | POA: Diagnosis not present

## 2017-08-13 ENCOUNTER — Ambulatory Visit
Admission: RE | Admit: 2017-08-13 | Discharge: 2017-08-13 | Disposition: A | Discharge status: Home / Self Care | Payer: Medicare Other | Source: Ambulatory Visit | Attending: Family Medicine | Admitting: Family Medicine

## 2017-08-13 DIAGNOSIS — I6522 Occlusion and stenosis of left carotid artery: Secondary | ICD-10-CM

## 2017-08-13 DIAGNOSIS — I6523 Occlusion and stenosis of bilateral carotid arteries: Secondary | ICD-10-CM | POA: Diagnosis not present

## 2017-09-02 ENCOUNTER — Telehealth: Payer: Self-pay | Admitting: Cardiology

## 2017-09-02 NOTE — Telephone Encounter (Signed)
Spoke with pt who reports she saw her PCP in October who ordered a carotid doppler.  When they called with the results he advised her to establish with a vascular surgeon.  She is concerned because she has not heard anything back about that appt being scheduled.  Advised based on the results I am able to see the results read state "stable" compared to prior testing.  Requested she c/b to PCP to ask about the vascular appt.  She is reporting on Thursday last night she had a sensation of an electric current in her left breast area.  She described it as a "twinge" off and on throughout the course of the day.  It wasn't pain per say but caused her to stop doing whatever she she doing.  She denies any SOB, N/V, diaphoresis or radiation.  It is a "fast zap and then it's gone."  They might occur once or twice a day of not at all.  She took SL Ntg for the feeling but doesn't know if it helped any or not.  Advised since on active s/s to continue to follow/monitor.  Advised to mention to her PCP when she calls to ask about the referral to vascular surgery.  Pt is in agreement and states she feels some better now after discussing the s/s.  She is due for 1 yr f/u in 11/2017 with Dr Marlou Porch and I went ahead and scheduled that appt.  She will c/b prior to then if any questions or concerns.

## 2017-09-02 NOTE — Telephone Encounter (Signed)
Patient husband calling,   States that patient has a couple of different issues and would like to speak to a nurse about it.

## 2017-09-10 DIAGNOSIS — R29898 Other symptoms and signs involving the musculoskeletal system: Secondary | ICD-10-CM | POA: Diagnosis not present

## 2017-09-17 DIAGNOSIS — X32XXXD Exposure to sunlight, subsequent encounter: Secondary | ICD-10-CM | POA: Diagnosis not present

## 2017-09-17 DIAGNOSIS — L570 Actinic keratosis: Secondary | ICD-10-CM | POA: Diagnosis not present

## 2017-09-24 DIAGNOSIS — S0502XA Injury of conjunctiva and corneal abrasion without foreign body, left eye, initial encounter: Secondary | ICD-10-CM | POA: Diagnosis not present

## 2017-09-24 DIAGNOSIS — H43391 Other vitreous opacities, right eye: Secondary | ICD-10-CM | POA: Diagnosis not present

## 2017-09-24 DIAGNOSIS — H53021 Refractive amblyopia, right eye: Secondary | ICD-10-CM | POA: Diagnosis not present

## 2017-09-24 DIAGNOSIS — H35033 Hypertensive retinopathy, bilateral: Secondary | ICD-10-CM | POA: Diagnosis not present

## 2017-09-24 DIAGNOSIS — H5712 Ocular pain, left eye: Secondary | ICD-10-CM | POA: Diagnosis not present

## 2017-09-24 DIAGNOSIS — H16103 Unspecified superficial keratitis, bilateral: Secondary | ICD-10-CM | POA: Diagnosis not present

## 2017-09-27 ENCOUNTER — Other Ambulatory Visit: Payer: Self-pay

## 2017-09-27 ENCOUNTER — Ambulatory Visit (INDEPENDENT_AMBULATORY_CARE_PROVIDER_SITE_OTHER): Payer: Medicare Other | Admitting: Vascular Surgery

## 2017-09-27 ENCOUNTER — Encounter: Payer: Self-pay | Admitting: Vascular Surgery

## 2017-09-27 VITALS — BP 140/62 | HR 78 | Temp 97.0°F | Resp 16 | Ht 60.0 in | Wt 180.0 lb

## 2017-09-27 DIAGNOSIS — I6522 Occlusion and stenosis of left carotid artery: Secondary | ICD-10-CM | POA: Diagnosis not present

## 2017-09-27 DIAGNOSIS — H353121 Nonexudative age-related macular degeneration, left eye, early dry stage: Secondary | ICD-10-CM | POA: Diagnosis not present

## 2017-09-27 DIAGNOSIS — S0502XA Injury of conjunctiva and corneal abrasion without foreign body, left eye, initial encounter: Secondary | ICD-10-CM | POA: Diagnosis not present

## 2017-09-27 DIAGNOSIS — H35033 Hypertensive retinopathy, bilateral: Secondary | ICD-10-CM | POA: Diagnosis not present

## 2017-09-27 DIAGNOSIS — H53021 Refractive amblyopia, right eye: Secondary | ICD-10-CM | POA: Diagnosis not present

## 2017-09-27 DIAGNOSIS — H43391 Other vitreous opacities, right eye: Secondary | ICD-10-CM | POA: Diagnosis not present

## 2017-09-27 DIAGNOSIS — H16103 Unspecified superficial keratitis, bilateral: Secondary | ICD-10-CM | POA: Diagnosis not present

## 2017-09-27 DIAGNOSIS — H5712 Ocular pain, left eye: Secondary | ICD-10-CM | POA: Diagnosis not present

## 2017-09-27 NOTE — Progress Notes (Signed)
Patient ID: Cindy [name], female   DOB: Mar 17, 1932, 83 y.o.   MRN: [id number]  Reason for Consult: New Patient (Initial Visit) (subclavian artery stenosis)   Referred by Lawerance Cruel, MD  Subjective:     HPI:  Cindy [name] is a 83 y.o. female here for evaluation of carotid stenosis. She had cabg x 2 in 1981. Denies tia or cva. Also had discrepant bp in both arms with left lower than right. She does not have any left arm symptoms and no dizziness or lightheadedness with arm activity. She takes aspirin daily.   Past Medical History:  Diagnosis Date  . Asthma   . Bronchitis   . COPD (chronic obstructive pulmonary disease) ([place])   . Coronary artery disease    MI age 70  . GERD (gastroesophageal reflux disease)   . Heart attack ([place])   . Hypercholesteremia   . Hypertension   . Iron deficiency anemia    used to see hem in [place], [place].   [name] Renal insufficiency   . Thyroid disease    Family History  Problem Relation Age of Onset  . Melanoma Sister        x2  . Heart disease Father   . Heart disease Mother    Past Surgical History:  Procedure Laterality Date  . APPENDECTOMY    . BREAST BIOPSY Right    benign  . CORONARY ANGIOPLASTY WITH STENT PLACEMENT    . CORONARY ARTERY BYPASS GRAFT  09/1979  . Moh's Surgeries     for squamous cell carcinoma  . PARTIAL HYSTERECTOMY  10/1978    Short Social History:  Social History   Tobacco Use  . Smoking status: Former Smoker    Packs/day: 1.50    Years: 22.00    Pack years: 33.00    Types: Cigarettes    Last attempt to quit: 10/23/1979    Years since quitting: 37.9  . Smokeless tobacco: Never Used  Substance Use Topics  . Alcohol use: Yes    Comment: occasional    Allergies  Allergen Reactions  . Codeine Other (See Comments)    Headache from heavy doses of codeine only    Current Outpatient Medications  Medication Sig Dispense Refill  . aspirin 81 MG tablet Take 81 mg by mouth daily.     [name] atorvastatin  (LIPITOR) 40 MG tablet Take 40 mg by mouth every evening.     . benzonatate (TESSALON) 100 MG capsule TAKE ONE CAPSULE BY MOUTH THREE TIMES DAILY AS NEEDED FOR COUGH 270 capsule 3  . Calcium Carbonate-Vitamin D (CALCIUM 600+D) 600-200 MG-UNIT TABS Take 1 tablet by mouth 2 (two) times daily.    [name] erythromycin ophthalmic ointment     . ferrous sulfate 325 (65 FE) MG tablet Take 325 mg by mouth daily with breakfast.    . guaiFENesin (MUCINEX) 600 MG 12 hr tablet Take 600 mg by mouth 2 (two) times daily as needed for cough or to loosen phlegm.    . isosorbide mononitrate (IMDUR) 60 MG 24 hr tablet Take 1 tablet (60 mg total) by mouth daily. 90 tablet 1  . levothyroxine (SYNTHROID, LEVOTHROID) 175 MCG tablet     . losartan (COZAAR) 50 MG tablet Take 50 mg by mouth daily.    . metoprolol (TOPROL-XL) 50 MG 24 hr tablet Take 50 mg by mouth daily.      . Multiple Vitamins-Minerals (PRESERVISION AREDS 2 PO) Take 1 capsule by mouth 2 (two) times daily.    [name]  nitroGLYCERIN (NITROSTAT) 0.4 MG SL tablet Place 1 tablet (0.4 mg total) under the tongue every 5 (five) minutes as needed for chest pain. 75 tablet 1  . olopatadine (PATANOL) 0.1 % ophthalmic solution     . pantoprazole (PROTONIX) 40 MG tablet Take 1 tablet (40 mg total) by mouth 2 (two) times daily. 90 tablet 3  . Polyethyl Glycol-Propyl Glycol (SYSTANE OP) Apply 1-2 drops to eye at bedtime.    [name] spironolactone-hydrochlorothiazide (ALDACTAZIDE) 25-25 MG per tablet Take 1 tablet by mouth daily.      . traMADol (ULTRAM) 50 MG tablet Take 50 mg by mouth 2 (two) times daily. For pain. Maximum dose= 8 tablets per day    . Cetirizine HCl (ZYRTEC ALLERGY PO) Take 10 mg by mouth daily.    [name] levothyroxine (SYNTHROID, LEVOTHROID) 150 MCG tablet Take 175 mcg by mouth daily.      No current facility-administered medications for this visit.     Review of Systems  Constitutional:  Constitutional negative. HENT: HENT negative.  Eyes: Eyes negative.    Respiratory: Respiratory negative.  Cardiovascular: Cardiovascular negative.  GI: Gastrointestinal negative.  Musculoskeletal: Musculoskeletal negative.  Skin: Skin negative.  Neurological: Neurological negative. Hematologic: Hematologic/lymphatic negative.  Psychiatric: Psychiatric negative.        Objective:  Objective   Vitals:   09/27/17 1146 09/27/17 1157  BP: 132/68 140/62  Pulse: 78 78  Resp: 16   Temp: (!) 97 F (36.1 C)   SpO2: 98%   Weight: 180 lb (81.6 kg)   Height: 5' (1.524 m)    Body mass index is 35.15 kg/m.  Physical Exam  Constitutional: She is oriented to person, place, and time. She appears well-developed.  HENT:  Head: Normocephalic.  Eyes: Pupils are equal, round, and reactive to light.  Neck: Normal range of motion. Neck supple.  Cardiovascular: Normal rate.  Pulses:      Radial pulses are 2+ on the right side, and 1+ on the left side.  Pulmonary/Chest: Effort normal.  Abdominal: Soft. She exhibits no mass.  Musculoskeletal: Normal range of motion.  Neurological: She is alert and oriented to person, place, and time.  Skin: Skin is warm and dry.  Psychiatric: She has a normal mood and affect. Her behavior is normal. Judgment and thought content normal.    Data: I have independently interpreted her outside carotid duplex. psv 140 and edv 34 would be 40-59% stenosis on left by our criteria     Assessment/Plan:     83yo wf with asymptomatic left ica stenosis. May also have left subclavian stenosis by bp and slightly diminshed radial pulse on left but without vertebrobasilar symptoms to suggest steal or left arm symptoms. Will have her f/u in 1 year with repeat duplex unless she were to have symptoms of stroke/tia/amaurosis. Should check bp in right arm. Continue asa and statin.  Waynetta Sandy MD [facility]

## 2017-10-01 DIAGNOSIS — H35033 Hypertensive retinopathy, bilateral: Secondary | ICD-10-CM | POA: Diagnosis not present

## 2017-10-01 DIAGNOSIS — H16103 Unspecified superficial keratitis, bilateral: Secondary | ICD-10-CM | POA: Diagnosis not present

## 2017-10-01 DIAGNOSIS — S0502XA Injury of conjunctiva and corneal abrasion without foreign body, left eye, initial encounter: Secondary | ICD-10-CM | POA: Diagnosis not present

## 2017-10-01 DIAGNOSIS — H53021 Refractive amblyopia, right eye: Secondary | ICD-10-CM | POA: Diagnosis not present

## 2017-10-14 NOTE — Addendum Note (Signed)
Addended by: Imoni Kohen A on: 10/14/2017 11:39 AM   Modules accepted: Orders  

## 2017-10-29 ENCOUNTER — Other Ambulatory Visit: Payer: Self-pay | Admitting: Family Medicine

## 2017-10-29 DIAGNOSIS — Z1231 Encounter for screening mammogram for malignant neoplasm of breast: Secondary | ICD-10-CM

## 2017-11-28 ENCOUNTER — Other Ambulatory Visit: Payer: Self-pay | Admitting: Adult Health

## 2017-12-03 ENCOUNTER — Ambulatory Visit
Admission: RE | Admit: 2017-12-03 | Discharge: 2017-12-03 | Disposition: A | Discharge status: Home / Self Care | Payer: Medicare Other | Source: Ambulatory Visit | Attending: Family Medicine | Admitting: Family Medicine

## 2017-12-03 DIAGNOSIS — Z1231 Encounter for screening mammogram for malignant neoplasm of breast: Secondary | ICD-10-CM

## 2017-12-04 ENCOUNTER — Encounter: Payer: Self-pay | Admitting: Cardiology

## 2017-12-04 ENCOUNTER — Ambulatory Visit (INDEPENDENT_AMBULATORY_CARE_PROVIDER_SITE_OTHER): Payer: Medicare Other | Admitting: Cardiology

## 2017-12-04 VITALS — BP 146/70 | HR 54 | Ht 61.0 in | Wt 183.6 lb

## 2017-12-04 DIAGNOSIS — Z87891 Personal history of nicotine dependence: Secondary | ICD-10-CM | POA: Diagnosis not present

## 2017-12-04 DIAGNOSIS — J849 Interstitial pulmonary disease, unspecified: Secondary | ICD-10-CM | POA: Diagnosis not present

## 2017-12-04 DIAGNOSIS — I2510 Atherosclerotic heart disease of native coronary artery without angina pectoris: Secondary | ICD-10-CM

## 2017-12-04 NOTE — Patient Instructions (Signed)

## 2017-12-04 NOTE — Progress Notes (Signed)
[facility]. [address], [address],   [address] Phone: [phone] Fax:  [phone]  Date:  2017/12/15   ID:  Cindy [name], DOB 1931/10/30, MRN [id number]  PCP:  Lawerance Cruel, MD   History of Present Illness: Cindy [name] is a 84 y.o. female with coronary artery disease status post myocardial infarction at age 61, hypertension, hyperlipidemia, nonspecific interstitial lung disease, with bypass surgery in [place] in 1981 here for six-month followup.  2 previous stents were placed in 2005, kissing stents, one in 2009 and a PTCA in 2010.  Overall she's been worried about some leg weakness bilaterally. No claudication/burning. We are giving her statin holiday. No chest pain, no shortness of breath, no syncope  Left bundle branch block has been chronic. Nuclear stress test in 2017 showed no ischemia.  Lost son 41. Died. Sudden death. He was resuscitated briefly but then died in the ambulance. Sister died of cancer.   2017-12-15 - called in 08/2017 with sharp fleeting left breast pain. Resolved, atypical. Also saw Dr. Donzetta Matters in [facility] with left ICA stenosis. Continue to exercise. Leg weakness and staggering at times, dysequilibrium.  No chest pain, no significant change in shortness of breath.  Weight has been stable.  Her husband is a patient of Dr. Tamala Julian.     Wt Readings from Last 3 Encounters:  12-15-17 183 lb 9.6 oz (83.3 kg)  09/27/17 180 lb (81.6 kg)  05/02/17 183 lb 3.2 oz (83.1 kg)     Past Medical History:  Diagnosis Date  . Asthma   . Bronchitis   . COPD (chronic obstructive pulmonary disease) ([place])   . Coronary artery disease    MI age 49  . GERD (gastroesophageal reflux disease)   . Heart attack ([place])   . Hypercholesteremia   . Hypertension   . Iron deficiency anemia    used to see hem in [place], [place].   [name] Renal insufficiency   . Thyroid disease     Past Surgical History:  Procedure Laterality Date  . APPENDECTOMY    . BREAST  BIOPSY Right    benign  . CORONARY ANGIOPLASTY WITH STENT PLACEMENT    . CORONARY ARTERY BYPASS GRAFT  09/1979  . Moh's Surgeries     for squamous cell carcinoma  . PARTIAL HYSTERECTOMY  10/1978    Current Outpatient Medications  Medication Sig Dispense Refill  . aspirin 81 MG tablet Take 81 mg by mouth daily.     [name] atorvastatin (LIPITOR) 40 MG tablet Take 40 mg by mouth every evening.     . Calcium Carbonate-Vitamin D (CALCIUM 600+D) 600-200 MG-UNIT TABS Take 1 tablet by mouth 2 (two) times daily.    . ferrous sulfate 325 (65 FE) MG tablet Take 325 mg by mouth daily with breakfast.    . fexofenadine (ALLEGRA) 180 MG tablet Take 180 mg by mouth daily.    . GuaiFENesin (MUCINEX PO) Take 400 mg by mouth daily.    . isosorbide mononitrate (IMDUR) 60 MG 24 hr tablet Take 1 tablet (60 mg total) by mouth daily. 90 tablet 1  . levothyroxine (SYNTHROID, LEVOTHROID) 150 MCG tablet Take 150 mcg by mouth daily before breakfast.     . losartan (COZAAR) 50 MG tablet Take 50 mg by mouth daily.    . metoprolol (TOPROL-XL) 50 MG 24 hr tablet Take 50 mg by mouth daily.      . Multiple Vitamins-Minerals (PRESERVISION AREDS 2 PO) Take  1 capsule by mouth 2 (two) times daily.    . nitroGLYCERIN (NITROSTAT) 0.4 MG SL tablet Place 1 tablet (0.4 mg total) under the tongue every 5 (five) minutes as needed for chest pain. 75 tablet 1  . pantoprazole (PROTONIX) 40 MG tablet TAKE 1 TABLET BY MOUTH TWICE DAILY 180 tablet 1  . Polyethyl Glycol-Propyl Glycol (SYSTANE OP) Apply 1-2 drops to eye at bedtime.    [name] spironolactone-hydrochlorothiazide (ALDACTAZIDE) 25-25 MG per tablet Take 1 tablet by mouth daily.      . traMADol (ULTRAM) 50 MG tablet Take 50 mg by mouth 2 (two) times daily. For pain. Maximum dose= 8 tablets per day     No current facility-administered medications for this visit.     Allergies:    Allergies  Allergen Reactions  . Codeine Other (See Comments)    Headache from heavy doses of codeine only      Social History:  The patient  reports that she quit smoking about 38 years ago. Her smoking use included cigarettes. She has a 33.00 pack-year smoking history. she has never used smokeless tobacco. She reports that she drinks alcohol. She reports that she does not use drugs.   ROS:  Please see the history of present illness.   Denies any fevers, chills, orthopnea, PND.    PHYSICAL EXAM: VS:  BP (!) 146/70   Pulse (!) 54   Ht 5\' 1"  (1.549 m)   Wt 183 lb 9.6 oz (83.3 kg)   LMP  (LMP Unknown)   BMI 34.69 kg/m   GEN: Well nourished, well developed, in no acute distress  HEENT: normal  Neck: no JVD, carotid bruits, or masses Cardiac: RRR; soft systolic murmurs, no rubs, or gallops,no edema  Respiratory:  velcro-like crackles at bases bilateral, normal work of breathing GI: soft, nontender, nondistended, + BS MS: no deformity or atrophy  Skin: warm and dry, no rash Neuro:  Alert and Oriented x 3, Strength and sensation are intact Psych: euthymic mood, full affect   EKG:  EKG today  today 12/04/17 shows sinus bradycardia first-degree AV block 54 with left bundle branch block 152 ms.12/11/16-sinus bradycardia rate 56 with nonspecific interventricular conduction delay, left bundle branch block like appearance personally viewed 03/04/15-sinus rhythm, first-degree AV block, left bundle branch block.-Her Sinus rhythm, first degree AV block, nonspecific intraventricular conduction delay, QRS duration 146 ms. Nonspecific ST-T wave changes.  Nuclear stress test: 12/31/13-moderate inferior scar as well as apical scar. No significant ischemia.   02/28/16:Nuclear stress EF: 62%.  The study is normal.  This is a low risk study.  The left ventricular ejection fraction is normal (55-65%). Paradoxical septal motion consistent with LBBB.      LABS: 10/07/13- LDL 142, hemoglobin 12.5, creatinine 1.1  ASSESSMENT AND PLAN:  1. Coronary artery disease-status post CABG in 1981. Subsequent stent  placement. Nuclear stress test demonstrated inferior apical scar. No ischemia. Continue with medical management. 2. Old myocardial infarction-age 30. Continue with aggressive secondary prevention. 3. History of tobacco use-quit smoking several years ago. Excellent 4. Hyperlipidemia-LDL goal is 70. Back on atorvastatin.  LDL. Excellent. She has been having some leg weakness she states or she just has to stop, she continues she feels as if she is staggering. Sed rate checked by Dr. Harrington Challenger.  5. Asymptomatic left ICA stenosis (may have left subclavian) -  Dr. Donzetta Matters is now following.  6. Obesity-encourage weight loss 7. GERD-continue with PPI Likely her prior symptoms at night. 8. First degree AVB -  stable. Left bundle branch block. No syncope. May need a pacemaker if conduction disorder deteriorates in the future, discussed. 9. COPD/ILD-per primary/pulmonary team. Stable. Reviewed pulmonology note. Dr. Roni Bread 10. 90-month follow-up   Signed, Candee Furbish, MD [facility]  12/04/2017 11:37 AM

## 2018-02-05 DIAGNOSIS — H01002 Unspecified blepharitis right lower eyelid: Secondary | ICD-10-CM | POA: Diagnosis not present

## 2018-02-05 DIAGNOSIS — M542 Cervicalgia: Secondary | ICD-10-CM | POA: Diagnosis not present

## 2018-02-05 DIAGNOSIS — H01001 Unspecified blepharitis right upper eyelid: Secondary | ICD-10-CM | POA: Diagnosis not present

## 2018-02-05 DIAGNOSIS — J309 Allergic rhinitis, unspecified: Secondary | ICD-10-CM | POA: Diagnosis not present

## 2018-02-05 DIAGNOSIS — H01004 Unspecified blepharitis left upper eyelid: Secondary | ICD-10-CM | POA: Diagnosis not present

## 2018-02-05 DIAGNOSIS — D638 Anemia in other chronic diseases classified elsewhere: Secondary | ICD-10-CM | POA: Diagnosis not present

## 2018-02-05 DIAGNOSIS — M549 Dorsalgia, unspecified: Secondary | ICD-10-CM | POA: Diagnosis not present

## 2018-02-05 DIAGNOSIS — H16103 Unspecified superficial keratitis, bilateral: Secondary | ICD-10-CM | POA: Diagnosis not present

## 2018-02-17 DIAGNOSIS — M47812 Spondylosis without myelopathy or radiculopathy, cervical region: Secondary | ICD-10-CM | POA: Diagnosis not present

## 2018-02-17 DIAGNOSIS — M47816 Spondylosis without myelopathy or radiculopathy, lumbar region: Secondary | ICD-10-CM | POA: Diagnosis not present

## 2018-02-26 DIAGNOSIS — M47812 Spondylosis without myelopathy or radiculopathy, cervical region: Secondary | ICD-10-CM | POA: Diagnosis not present

## 2018-03-07 DIAGNOSIS — R109 Unspecified abdominal pain: Secondary | ICD-10-CM | POA: Diagnosis not present

## 2018-03-07 DIAGNOSIS — J329 Chronic sinusitis, unspecified: Secondary | ICD-10-CM | POA: Diagnosis not present

## 2018-03-22 DIAGNOSIS — M25571 Pain in right ankle and joints of right foot: Secondary | ICD-10-CM | POA: Diagnosis not present

## 2018-03-26 DIAGNOSIS — R5381 Other malaise: Secondary | ICD-10-CM | POA: Diagnosis not present

## 2018-03-26 DIAGNOSIS — C4490 Unspecified malignant neoplasm of skin, unspecified: Secondary | ICD-10-CM | POA: Diagnosis not present

## 2018-03-26 DIAGNOSIS — M109 Gout, unspecified: Secondary | ICD-10-CM | POA: Diagnosis not present

## 2018-03-26 DIAGNOSIS — E039 Hypothyroidism, unspecified: Secondary | ICD-10-CM | POA: Diagnosis not present

## 2018-03-26 DIAGNOSIS — R109 Unspecified abdominal pain: Secondary | ICD-10-CM | POA: Diagnosis not present

## 2018-04-01 DIAGNOSIS — I781 Nevus, non-neoplastic: Secondary | ICD-10-CM | POA: Diagnosis not present

## 2018-04-01 DIAGNOSIS — C44622 Squamous cell carcinoma of skin of right upper limb, including shoulder: Secondary | ICD-10-CM | POA: Diagnosis not present

## 2018-05-06 DIAGNOSIS — Z85828 Personal history of other malignant neoplasm of skin: Secondary | ICD-10-CM | POA: Diagnosis not present

## 2018-05-06 DIAGNOSIS — Z08 Encounter for follow-up examination after completed treatment for malignant neoplasm: Secondary | ICD-10-CM | POA: Diagnosis not present

## 2018-05-23 ENCOUNTER — Ambulatory Visit (INDEPENDENT_AMBULATORY_CARE_PROVIDER_SITE_OTHER)
Admission: RE | Admit: 2018-05-23 | Discharge: 2018-05-23 | Disposition: A | Discharge status: Home / Self Care | Payer: Medicare Other | Source: Ambulatory Visit | Attending: Pulmonary Disease | Admitting: Pulmonary Disease

## 2018-05-23 ENCOUNTER — Ambulatory Visit (INDEPENDENT_AMBULATORY_CARE_PROVIDER_SITE_OTHER): Payer: Medicare Other | Admitting: Pulmonary Disease

## 2018-05-23 ENCOUNTER — Encounter: Payer: Self-pay | Admitting: Pulmonary Disease

## 2018-05-23 DIAGNOSIS — R05 Cough: Secondary | ICD-10-CM

## 2018-05-23 DIAGNOSIS — J849 Interstitial pulmonary disease, unspecified: Secondary | ICD-10-CM

## 2018-05-23 DIAGNOSIS — I2510 Atherosclerotic heart disease of native coronary artery without angina pectoris: Secondary | ICD-10-CM

## 2018-05-23 DIAGNOSIS — J300 Vasomotor rhinitis: Secondary | ICD-10-CM | POA: Diagnosis not present

## 2018-05-23 DIAGNOSIS — R059 Cough, unspecified: Secondary | ICD-10-CM

## 2018-05-23 MED ORDER — IPRATROPIUM BROMIDE 0.03 % NA SOLN: 1.0000 | Freq: Two times a day (BID) | NASAL | 0 refills | Status: DC

## 2018-05-23 MED ORDER — BENZONATATE 200 MG PO CAPS: 200.0000 mg | ORAL_CAPSULE | Freq: Three times a day (TID) | ORAL | 2 refills | Status: DC | PRN

## 2018-05-23 NOTE — Assessment & Plan Note (Signed)
Trial of Atrovent nasal spray -each nare twice daily.

## 2018-05-23 NOTE — Progress Notes (Signed)
   Subjective:    Patient ID: Cindy [name], female    DOB: 04/06/1932, 84 y.o.   MRN: [id number]  HPI  84 yo remote ex-smoker for FU of ILD, chronic cough  & stable RLL pulmonary nodule. She presented in 2013 with persistent wheezing and cough  Had a CABG in 81, Tessalon Perles have not helped. Tussionex caused her to be drowsy .  She smoked 1.5 packs per day for 25 years before quitting in '81  Annual follow-up, her cough persists.  Unfortunately she has not found anything that really helped her.  Tessalon Perles to help somewhat but this was too expensive on her insurance. She reports occasional heartburn for which she takes nitroglycerin that seems to help?  Esophageal spasms.  She states that her nose runs all the time is also her eyes, she is taking Zyrtec and fexofenadine without any relief.  She has tried Flonase  Cough is dry without diurnal variation or seasonal     Significant tests/ events  HRCT 03/2012 ILD pattern is nonspecific, and could represent either early usual interstitial pneumonia (UIP), or nonspecific interstitial pneumonia (NSIP). 4mm RLL nodule   CT chest 07/19/14 -mild subpleural fibroiss unchanged from 03/2012. Scattered pulmonary nodules are unchanged (considered benign with 2 yr serial follow up ) .   PFTs 03/2012 no airway obstruction, preserved lung volumes, isolated decrease in DLCO to 56% corrects for [place].   Spirometry 04/2013 - preserved lung function w/ no airflow obstruction.    Review of Systems neg for any significant sore throat, dysphagia, itching, sneezing, nasal congestion or excess/ purulent secretions, fever, chills, sweats, unintended wt loss, pleuritic or exertional cp, hempoptysis, orthopnea pnd or change in chronic leg swelling. Also denies presyncope, palpitations, heartburn, abdominal pain, nausea, vomiting, diarrhea or change in bowel or urinary habits, dysuria,hematuria, rash, arthralgias, visual complaints, headache, numbness  weakness or ataxia.     Objective:   Physical Exam  Gen. Pleasant, obese, in no distress ENT - no lesions, no post nasal drip Neck: No JVD, no thyromegaly, no carotid bruits Lungs: no use of accessory muscles, no dullness to percussion, decreased with left basal rales no rhonchi  Cardiovascular: Rhythm regular, heart sounds  normal, no murmurs or gallops, no peripheral edema Musculoskeletal: No deformities, no cyanosis or clubbing , no tremors       Assessment & Plan:

## 2018-05-23 NOTE — Assessment & Plan Note (Signed)
Appears to be postinflammatory scarring but will do an interval scan to see if there is progression of ILD   schedule high-resolution CT chest

## 2018-05-23 NOTE — Patient Instructions (Signed)
Trial of Atrovent nasal spray -each nare twice daily.  Benzonatate 200 mg thrice daily as needed for cough # 90 x 2 refills   schedule high-resolution CT chest to follow-up on scar tissue in the lungs

## 2018-05-23 NOTE — Assessment & Plan Note (Signed)
Trial of Atrovent nasal spray -each nare twice daily for?  Vasomotor rhinitis  Benzonatate 200 mg thrice daily as needed for cough # 90 x 2 refills

## 2018-05-28 ENCOUNTER — Other Ambulatory Visit: Payer: Self-pay | Admitting: Adult Health

## 2018-06-09 DIAGNOSIS — M47816 Spondylosis without myelopathy or radiculopathy, lumbar region: Secondary | ICD-10-CM | POA: Diagnosis not present

## 2018-06-12 ENCOUNTER — Ambulatory Visit: Payer: Medicare Other | Admitting: Pulmonary Disease

## 2018-06-12 DIAGNOSIS — M47816 Spondylosis without myelopathy or radiculopathy, lumbar region: Secondary | ICD-10-CM | POA: Diagnosis not present

## 2018-07-14 DIAGNOSIS — R531 Weakness: Secondary | ICD-10-CM | POA: Diagnosis not present

## 2018-07-14 DIAGNOSIS — R351 Nocturia: Secondary | ICD-10-CM | POA: Diagnosis not present

## 2018-08-19 DIAGNOSIS — Z23 Encounter for immunization: Secondary | ICD-10-CM | POA: Diagnosis not present

## 2018-08-29 DIAGNOSIS — E039 Hypothyroidism, unspecified: Secondary | ICD-10-CM | POA: Diagnosis not present

## 2018-08-29 DIAGNOSIS — M109 Gout, unspecified: Secondary | ICD-10-CM | POA: Diagnosis not present

## 2018-08-29 DIAGNOSIS — I10 Essential (primary) hypertension: Secondary | ICD-10-CM | POA: Diagnosis not present

## 2018-08-29 DIAGNOSIS — D638 Anemia in other chronic diseases classified elsewhere: Secondary | ICD-10-CM | POA: Diagnosis not present

## 2018-08-29 DIAGNOSIS — E785 Hyperlipidemia, unspecified: Secondary | ICD-10-CM | POA: Diagnosis not present

## 2018-09-03 DIAGNOSIS — N183 Chronic kidney disease, stage 3 (moderate): Secondary | ICD-10-CM | POA: Diagnosis not present

## 2018-09-03 DIAGNOSIS — E039 Hypothyroidism, unspecified: Secondary | ICD-10-CM | POA: Diagnosis not present

## 2018-09-03 DIAGNOSIS — G894 Chronic pain syndrome: Secondary | ICD-10-CM | POA: Diagnosis not present

## 2018-09-03 DIAGNOSIS — M109 Gout, unspecified: Secondary | ICD-10-CM | POA: Diagnosis not present

## 2018-09-03 DIAGNOSIS — I10 Essential (primary) hypertension: Secondary | ICD-10-CM | POA: Diagnosis not present

## 2018-09-03 DIAGNOSIS — K219 Gastro-esophageal reflux disease without esophagitis: Secondary | ICD-10-CM | POA: Diagnosis not present

## 2018-09-03 DIAGNOSIS — E785 Hyperlipidemia, unspecified: Secondary | ICD-10-CM | POA: Diagnosis not present

## 2018-09-03 DIAGNOSIS — Z0000 Encounter for general adult medical examination without abnormal findings: Secondary | ICD-10-CM | POA: Diagnosis not present

## 2018-09-03 DIAGNOSIS — M549 Dorsalgia, unspecified: Secondary | ICD-10-CM | POA: Diagnosis not present

## 2018-11-11 DIAGNOSIS — D0471 Carcinoma in situ of skin of right lower limb, including hip: Secondary | ICD-10-CM | POA: Diagnosis not present

## 2018-11-11 DIAGNOSIS — M1990 Unspecified osteoarthritis, unspecified site: Secondary | ICD-10-CM | POA: Diagnosis not present

## 2018-11-11 DIAGNOSIS — X32XXXD Exposure to sunlight, subsequent encounter: Secondary | ICD-10-CM | POA: Diagnosis not present

## 2018-11-11 DIAGNOSIS — L570 Actinic keratosis: Secondary | ICD-10-CM | POA: Diagnosis not present

## 2018-11-14 ENCOUNTER — Other Ambulatory Visit: Payer: Self-pay | Admitting: Family Medicine

## 2018-11-14 DIAGNOSIS — Z1231 Encounter for screening mammogram for malignant neoplasm of breast: Secondary | ICD-10-CM

## 2018-11-21 ENCOUNTER — Other Ambulatory Visit: Payer: Self-pay | Admitting: Pulmonary Disease

## 2018-11-26 ENCOUNTER — Telehealth: Payer: Self-pay | Admitting: Cardiology

## 2018-11-26 ENCOUNTER — Other Ambulatory Visit: Payer: Self-pay

## 2018-11-26 ENCOUNTER — Observation Stay (HOSPITAL_COMMUNITY)
Admission: EM | Admit: 2018-11-26 | Discharge: 2018-11-28 | Disposition: A | Discharge status: Home / Self Care | Payer: Medicare Other | Attending: Internal Medicine | Admitting: Internal Medicine

## 2018-11-26 ENCOUNTER — Emergency Department (HOSPITAL_COMMUNITY): Payer: Medicare Other

## 2018-11-26 ENCOUNTER — Encounter (HOSPITAL_COMMUNITY): Payer: Self-pay

## 2018-11-26 DIAGNOSIS — Z79890 Hormone replacement therapy (postmenopausal): Secondary | ICD-10-CM | POA: Diagnosis not present

## 2018-11-26 DIAGNOSIS — I25700 Atherosclerosis of coronary artery bypass graft(s), unspecified, with unstable angina pectoris: Principal | ICD-10-CM | POA: Insufficient documentation

## 2018-11-26 DIAGNOSIS — I2510 Atherosclerotic heart disease of native coronary artery without angina pectoris: Secondary | ICD-10-CM | POA: Diagnosis present

## 2018-11-26 DIAGNOSIS — Z90711 Acquired absence of uterus with remaining cervical stump: Secondary | ICD-10-CM | POA: Diagnosis not present

## 2018-11-26 DIAGNOSIS — E7800 Pure hypercholesterolemia, unspecified: Secondary | ICD-10-CM | POA: Diagnosis not present

## 2018-11-26 DIAGNOSIS — I420 Dilated cardiomyopathy: Secondary | ICD-10-CM | POA: Insufficient documentation

## 2018-11-26 DIAGNOSIS — I129 Hypertensive chronic kidney disease with stage 1 through stage 4 chronic kidney disease, or unspecified chronic kidney disease: Secondary | ICD-10-CM | POA: Diagnosis not present

## 2018-11-26 DIAGNOSIS — I447 Left bundle-branch block, unspecified: Secondary | ICD-10-CM | POA: Insufficient documentation

## 2018-11-26 DIAGNOSIS — Z7982 Long term (current) use of aspirin: Secondary | ICD-10-CM | POA: Insufficient documentation

## 2018-11-26 DIAGNOSIS — Z87891 Personal history of nicotine dependence: Secondary | ICD-10-CM | POA: Diagnosis not present

## 2018-11-26 DIAGNOSIS — I208 Other forms of angina pectoris: Secondary | ICD-10-CM | POA: Diagnosis not present

## 2018-11-26 DIAGNOSIS — R0789 Other chest pain: Secondary | ICD-10-CM

## 2018-11-26 DIAGNOSIS — K219 Gastro-esophageal reflux disease without esophagitis: Secondary | ICD-10-CM | POA: Insufficient documentation

## 2018-11-26 DIAGNOSIS — J849 Interstitial pulmonary disease, unspecified: Secondary | ICD-10-CM | POA: Insufficient documentation

## 2018-11-26 DIAGNOSIS — E785 Hyperlipidemia, unspecified: Secondary | ICD-10-CM | POA: Diagnosis not present

## 2018-11-26 DIAGNOSIS — I080 Rheumatic disorders of both mitral and aortic valves: Secondary | ICD-10-CM | POA: Insufficient documentation

## 2018-11-26 DIAGNOSIS — I252 Old myocardial infarction: Secondary | ICD-10-CM | POA: Insufficient documentation

## 2018-11-26 DIAGNOSIS — N183 Chronic kidney disease, stage 3 (moderate): Secondary | ICD-10-CM | POA: Diagnosis not present

## 2018-11-26 DIAGNOSIS — Z8249 Family history of ischemic heart disease and other diseases of the circulatory system: Secondary | ICD-10-CM | POA: Diagnosis not present

## 2018-11-26 DIAGNOSIS — R079 Chest pain, unspecified: Secondary | ICD-10-CM

## 2018-11-26 DIAGNOSIS — R05 Cough: Secondary | ICD-10-CM | POA: Diagnosis not present

## 2018-11-26 DIAGNOSIS — Z885 Allergy status to narcotic agent status: Secondary | ICD-10-CM | POA: Diagnosis not present

## 2018-11-26 DIAGNOSIS — Z955 Presence of coronary angioplasty implant and graft: Secondary | ICD-10-CM | POA: Diagnosis not present

## 2018-11-26 DIAGNOSIS — M6281 Muscle weakness (generalized): Secondary | ICD-10-CM | POA: Diagnosis not present

## 2018-11-26 DIAGNOSIS — D509 Iron deficiency anemia, unspecified: Secondary | ICD-10-CM | POA: Diagnosis not present

## 2018-11-26 DIAGNOSIS — J449 Chronic obstructive pulmonary disease, unspecified: Secondary | ICD-10-CM | POA: Insufficient documentation

## 2018-11-26 DIAGNOSIS — I200 Unstable angina: Secondary | ICD-10-CM | POA: Diagnosis present

## 2018-11-26 DIAGNOSIS — Z79899 Other long term (current) drug therapy: Secondary | ICD-10-CM | POA: Diagnosis not present

## 2018-11-26 DIAGNOSIS — E039 Hypothyroidism, unspecified: Secondary | ICD-10-CM | POA: Diagnosis not present

## 2018-11-26 DIAGNOSIS — N1832 Chronic kidney disease, stage 3b: Secondary | ICD-10-CM | POA: Diagnosis present

## 2018-11-26 LAB — BASIC METABOLIC PANEL
Anion gap: 11 (ref 5–15)
BUN: 29 mg/dL — ABNORMAL HIGH (ref 8–23)
CO2: 23 mmol/L (ref 22–32)
Calcium: 10.2 mg/dL (ref 8.9–10.3)
Chloride: 106 mmol/L (ref 98–111)
Creatinine, Ser: 1.44 mg/dL — ABNORMAL HIGH (ref 0.44–1.00)
GFR calc Af Amer: 39 mL/min — ABNORMAL LOW (ref >60)
GFR calc non Af Amer: 33 mL/min — ABNORMAL LOW (ref >60)
Glucose, Bld: 110 mg/dL — ABNORMAL HIGH (ref 70–99)
Potassium: 4.5 mmol/L (ref 3.5–5.1)
Sodium: 140 mmol/L (ref 135–145)

## 2018-11-26 LAB — CBC
HCT: 35.1 % — ABNORMAL LOW (ref 36.0–46.0)
HCT: 35.9 % — ABNORMAL LOW (ref 36.0–46.0)
Hemoglobin: 11.4 g/dL — ABNORMAL LOW (ref 12.0–15.0)
Hemoglobin: 11.7 g/dL — ABNORMAL LOW (ref 12.0–15.0)
MCH: 31.1 pg (ref 26.0–34.0)
MCH: 31 pg (ref 26.0–34.0)
MCHC: 32.5 g/dL (ref 30.0–36.0)
MCHC: 32.6 g/dL (ref 30.0–36.0)
MCV: 95.9 fL (ref 80.0–100.0)
MCV: 95 fL (ref 80.0–100.0)
Platelets: 178 10*3/uL (ref 150–400)
Platelets: 167 10*3/uL (ref 150–400)
RBC: 3.66 MIL/uL — ABNORMAL LOW (ref 3.87–5.11)
RBC: 3.78 MIL/uL — AB (ref 3.87–5.11)
RDW: 12.2 % (ref 11.5–15.5)
RDW: 12.2 % (ref 11.5–15.5)
WBC: 6.4 10*3/uL (ref 4.0–10.5)
WBC: 6 10*3/uL (ref 4.0–10.5)
nRBC: 0 % (ref 0.0–0.2)
nRBC: 0 % (ref 0.0–0.2)

## 2018-11-26 LAB — TROPONIN I
Troponin I: <0.03 ng/mL (ref <0.03)
Troponin I: <0.03 ng/mL (ref <0.03)
Troponin I: <0.03 ng/mL (ref <0.03)

## 2018-11-26 LAB — URINALYSIS, ROUTINE W REFLEX MICROSCOPIC
Bilirubin Urine: NEGATIVE
Glucose, UA: NEGATIVE mg/dL
Hgb urine dipstick: NEGATIVE
Ketones, ur: NEGATIVE mg/dL
Nitrite: NEGATIVE
Protein, ur: NEGATIVE mg/dL
Specific Gravity, Urine: 1.008 (ref 1.005–1.030)
pH: 5 (ref 5.0–8.0)

## 2018-11-26 LAB — I-STAT TROPONIN, ED: Troponin i, poc: 0 ng/mL (ref 0.00–0.08)

## 2018-11-26 LAB — CREATININE, SERUM
Creatinine, Ser: 1.41 mg/dL — ABNORMAL HIGH (ref 0.44–1.00)
GFR calc Af Amer: 40 mL/min — ABNORMAL LOW (ref >60)
GFR calc non Af Amer: 34 mL/min — ABNORMAL LOW (ref >60)

## 2018-11-26 MED ORDER — ONDANSETRON HCL 4 MG/2ML IJ SOLN: 4.0000 mg | Freq: Four times a day (QID) | INTRAMUSCULAR | Status: DC | PRN

## 2018-11-26 MED ORDER — ZOLPIDEM TARTRATE 5 MG PO TABS: 5.0000 mg | ORAL_TABLET | Freq: Every evening | ORAL | Status: DC | PRN

## 2018-11-26 MED ORDER — ACETAMINOPHEN 325 MG PO TABS
650.0000 mg | ORAL_TABLET | ORAL | Status: DC | PRN
  Administered 2018-11-27: 650 mg via ORAL
  Filled 2018-11-26: qty 2

## 2018-11-26 MED ORDER — ALUM & MAG HYDROXIDE-SIMETH 200-200-20 MG/5ML PO SUSP
30.0000 mL | Freq: Once | ORAL | Status: AC
  Administered 2018-11-26: 30 mL via ORAL
  Filled 2018-11-26: qty 30

## 2018-11-26 MED ORDER — BIOTENE DRY MOUTH MT LIQD: 15.0000 mL | OROMUCOSAL | Status: DC | PRN

## 2018-11-26 MED ORDER — HEPARIN SODIUM (PORCINE) 5000 UNIT/ML IJ SOLN
5000.0000 [IU] | Freq: Three times a day (TID) | INTRAMUSCULAR | Status: DC
  Administered 2018-11-26 – 2018-11-27 (×2): 5000 [IU] via SUBCUTANEOUS
  Filled 2018-11-26 (×3): qty 1

## 2018-11-26 MED ORDER — LIDOCAINE VISCOUS HCL 2 % MT SOLN
15.0000 mL | Freq: Once | OROMUCOSAL | Status: AC
  Administered 2018-11-26: 15 mL via ORAL
  Filled 2018-11-26: qty 15

## 2018-11-26 MED ORDER — MORPHINE SULFATE (PF) 2 MG/ML IV SOLN: 2.0000 mg | INTRAVENOUS | Status: DC | PRN

## 2018-11-26 MED ORDER — ASPIRIN EC 325 MG PO TBEC
325.0000 mg | DELAYED_RELEASE_TABLET | Freq: Every day | ORAL | Status: DC
  Administered 2018-11-26 – 2018-11-27 (×2): 325 mg via ORAL
  Filled 2018-11-26 (×2): qty 1

## 2018-11-26 NOTE — ED Triage Notes (Signed)
Pt reports left sided chest pain that radiates across her chest for the past 1.5 weeks, denies any n/v, sob, or dizziness. Hx of Mi in 1980 and bypass surgery. Pt alert, ambulatory.

## 2018-11-26 NOTE — Telephone Encounter (Signed)
New message:     Patient calling about some pain that she is having around her chest. Pain. Please call patient

## 2018-11-26 NOTE — ED Provider Notes (Signed)
[facility] EMERGENCY DEPARTMENT Provider Note   CSN: [id number] Arrival date & time: 11/26/18  1335     History   Chief Complaint Chief Complaint  Patient presents with  . Chest Pain    HPI Cindy [name] is a 85 y.o. female with history of CAD status post MI at age 55, bypass, hypertension, hyperlipidemia, interstitial lung disease, chronic back pain, arthritis is here for evaluation of chest pain.  Onset 2 weeks ago.  The pain is described as squeezing.  It starts in the left breast and radiates to the sternum.  It is been more frequent in the last couple of days.  She takes nitroglycerin which completely resolves the pain.  Sometimes the pain is very brief and it spontaneously resolves without intervention.  She has not noticed a pattern to it, sometimes it happens at rest.  It is mild.  Associate symptoms include leg weakness that she states is not new for her and relates it to her chronic back pain.  Reports 10 pound weight loss in the last 2 weeks.  Has had nighttime chills and "clamminess".  Chronic urinary frequency that is not changed.  She called her cardiologist this morning and was told to come to the ER.  She denies associated shortness of breath, palpitations, nausea, vomiting, diaphoresis.  No infectious symptoms such as vomiting, diarrhea, abdominal pain, dysuria, hematuria.  No history of DVT or PE, no recent surgery, estrogen use, history of cancer, lower extremity edema or calf pain.  2-hour plane ride December 2019.  HPI  Past Medical History:  Diagnosis Date  . Asthma   . Bronchitis   . COPD (chronic obstructive pulmonary disease) ([place])   . Coronary artery disease    MI age 58  . GERD (gastroesophageal reflux disease)   . Heart attack ([place])   . Hypercholesteremia   . Hypertension   . Iron deficiency anemia    used to see hem in [place], [place].   [name] Renal insufficiency   . Thyroid disease     Patient Active Problem List   Diagnosis Date Noted    . Altered mental status   . Sepsis ([place]) 01/06/2017  . Thrombocytopenia ([place]) 01/06/2017  . Lower urinary tract infectious disease 01/06/2017  . Encephalopathy acute 01/06/2017  . Hypothyroidism 01/06/2017  . Anemia 01/06/2017  . Umbilical hernia 23/53/6144  . Coronary atherosclerosis of native coronary artery 12/11/2013  . Old MI (myocardial infarction) 12/11/2013  . History of tobacco use 12/11/2013  . Angina decubitus ([place]) 12/11/2013  . Hyperlipidemia 12/11/2013  . Rhinitis 09/16/2012  . ILD (interstitial lung disease) ([place]) 05/08/2012  . Pulmonary nodule, right 05/06/2012  . Cough 03/20/2012  . Iron deficiency anemia   . Renal insufficiency     Past Surgical History:  Procedure Laterality Date  . APPENDECTOMY    . BREAST BIOPSY Right    benign  . CORONARY ANGIOPLASTY WITH STENT PLACEMENT    . CORONARY ARTERY BYPASS GRAFT  09/1979  . Moh's Surgeries     for squamous cell carcinoma  . PARTIAL HYSTERECTOMY  10/1978     OB History   No obstetric history on file.      Home Medications    Prior to Admission medications   Medication Sig Start Date End Date Taking? Authorizing Provider  aspirin 81 MG tablet Take 81 mg by mouth daily.     [provider]  atorvastatin (LIPITOR) 40 MG tablet Take 40 mg by mouth every evening.  12/29/15   [provider]  benzonatate (TESSALON) 200 MG capsule Take 1 capsule (200 mg total) by mouth 3 (three) times daily as needed for cough. 05/23/18   Rigoberto Noel, MD  Calcium Carbonate-Vitamin D (CALCIUM 600+D) 600-200 MG-UNIT TABS Take 1 tablet by mouth 2 (two) times daily.    [provider]  ferrous sulfate 325 (65 FE) MG tablet Take 325 mg by mouth daily with breakfast.    [provider]  fexofenadine (ALLEGRA) 180 MG tablet Take 180 mg by mouth daily.    [provider]  GuaiFENesin (MUCINEX PO) Take 400 mg by mouth daily.    [provider]  ipratropium (ATROVENT) 0.03 % nasal  spray Place 1 spray into both nostrils every 12 (twelve) hours. 05/23/18   Rigoberto Noel, MD  isosorbide mononitrate (IMDUR) 60 MG 24 hr tablet Take 1 tablet (60 mg total) by mouth daily. 12/10/13   Jerline Pain, MD  levothyroxine (SYNTHROID, LEVOTHROID) 150 MCG tablet Take 150 mcg by mouth daily before breakfast.     [provider]  losartan (COZAAR) 50 MG tablet Take 50 mg by mouth daily.    [provider]  metoprolol (TOPROL-XL) 50 MG 24 hr tablet Take 50 mg by mouth daily.      [provider]  Multiple Vitamins-Minerals (PRESERVISION AREDS 2 PO) Take 1 capsule by mouth 2 (two) times daily.    [provider]  nitroGLYCERIN (NITROSTAT) 0.4 MG SL tablet Place 1 tablet (0.4 mg total) under the tongue every 5 (five) minutes as needed for chest pain. 05/07/17   Jerline Pain, MD  pantoprazole (PROTONIX) 40 MG tablet TAKE 1 TABLET BY MOUTH TWICE DAILY 11/21/18   Rigoberto Noel, MD  Polyethyl Glycol-Propyl Glycol (SYSTANE OP) Apply 1-2 drops to eye at bedtime.    [provider]  spironolactone-hydrochlorothiazide (ALDACTAZIDE) 25-25 MG per tablet Take 1 tablet by mouth daily.      [provider]  traMADol (ULTRAM) 50 MG tablet Take 50 mg by mouth 2 (two) times daily. For pain. Maximum dose= 8 tablets per day    [provider]    Family History Family History  Problem Relation Age of Onset  . Melanoma Sister        x2  . Heart disease Father   . Heart disease Mother     Social History Social History   Tobacco Use  . Smoking status: Former Smoker    Packs/day: 1.50    Years: 22.00    Pack years: 33.00    Types: Cigarettes    Last attempt to quit: 10/23/1979    Years since quitting: 39.1  . Smokeless tobacco: Never Used  Substance Use Topics  . Alcohol use: Yes    Comment: occasional  . Drug use: No     Allergies   Codeine   Review of Systems Review of Systems  Constitutional: Positive for chills.    Cardiovascular: Positive for chest pain.  Neurological: Positive for weakness (bilateral leg; chronic).  All other systems reviewed and are negative.    Physical Exam Updated Vital Signs BP (!) 154/58   Pulse (!) 56   Temp (!) 97.5 F (36.4 C) (Oral)   Resp 17   Ht 5' (1.524 m)   Wt 75.3 kg   LMP  (LMP Unknown)   SpO2 94%   BMI 32.42 kg/m   Physical Exam Constitutional:      Appearance: She is well-developed.  Comments: NAD. Non toxic.   HENT:     Head: Normocephalic and atraumatic.     Nose: Nose normal.  Eyes:     General: Lids are normal.     Conjunctiva/sclera: Conjunctivae normal.  Neck:     Musculoskeletal: Normal range of motion.     Trachea: Trachea normal.     Comments: Trachea midline.  Cardiovascular:     Rate and Rhythm: Normal rate and regular rhythm.     Pulses:          Radial pulses are 2+ on the right side and 2+ on the left side.       Dorsalis pedis pulses are 2+ on the right side and 2+ on the left side.     Heart sounds: Normal heart sounds, S1 normal and S2 normal.     Comments: No LE edema or calf tenderness.  Pulmonary:     Effort: Pulmonary effort is normal.     Breath sounds: Normal breath sounds.  Abdominal:     General: Bowel sounds are normal.     Palpations: Abdomen is soft.     Tenderness: There is no abdominal tenderness.     Comments: No epigastric tenderness. No distention.   Skin:    General: Skin is warm and dry.     Capillary Refill: Capillary refill takes less than 2 seconds.     Comments: No rash to chest wall  Neurological:     Mental Status: She is alert.     GCS: GCS eye subscore is 4. GCS verbal subscore is 5. GCS motor subscore is 6.  Psychiatric:        Speech: Speech normal.        Behavior: Behavior normal.        Thought Content: Thought content normal.      ED Treatments / Results  Labs (all labs ordered are listed, but only abnormal results are displayed) Labs Reviewed  BASIC METABOLIC PANEL -  Abnormal; Notable for the following components:      Result Value   Glucose, Bld 110 (*)    BUN 29 (*)    Creatinine, Ser 1.44 (*)    GFR calc non Af Amer 33 (*)    GFR calc Af Amer 39 (*)    All other components within normal limits  CBC - Abnormal; Notable for the following components:   RBC 3.66 (*)    Hemoglobin 11.4 (*)    HCT 35.1 (*)    All other components within normal limits  URINALYSIS, ROUTINE W REFLEX MICROSCOPIC - Abnormal; Notable for the following components:   Leukocytes, UA TRACE (*)    Bacteria, UA MANY (*)    All other components within normal limits  I-STAT TROPONIN, ED    EKG EKG Interpretation  Date/Time:  Wednesday November 26 2018 13:43:49 EST Ventricular Rate:  63 PR Interval:    QRS Duration: 152 QT Interval:  450 QTC Calculation: 461 R Axis:   112 Text Interpretation:  Sinus rhythm Left bundle branch block No significant change since last tracing Confirmed by Gareth Morgan [phone]) on 11/26/2018 1:49:37 PM   Radiology Dg Chest 2 View  Result Date: 11/26/2018 CLINICAL DATA:  Left chest pain in the left breast area radiating to the central chest for the past 2 weeks. Chronic productive cough. Ex-smoker. EXAM: CHEST - 2 VIEW COMPARISON:  Chest CT dated 05/23/2018 and chest radiographs dated 01/05/2017. FINDINGS: Normal sized heart. Stable scarring at the left lung  base. Stable prominence of the interstitial markings with normal vasculature. Median sternotomy wires are again demonstrated with a broken inferior wire. Mild thoracic spine degenerative changes. Atheromatous aortic calcifications. Coronary artery stents. IMPRESSION: No acute finding. Stable chronic interstitial lung disease and left basilar scarring. Electronically Signed   By: Claudie Revering M.D.   On: 11/26/2018 14:17    Procedures Procedures (including critical care time)  Medications Ordered in ED Medications - No data to display   Initial Impression / Assessment and Plan / ED Course    I have reviewed the triage vital signs and the nursing notes.  Pertinent labs & imaging results that were available during my care of the patient were reviewed by me and considered in my medical decision making (see chart for details).  Clinical Course as of Nov 26 1550  Wed Nov 26, 2018  1427 Creatinine(!): 1.44 [CG]  1427 Hemoglobin(!): 11.4 [CG]    Clinical Course User Index [CG] Kinnie Feil, PA-C   Chest pain has atypical and typical features.  Heart score equals 4 given age, hypertension, hyperlipidemia, MI, left bundle branch block.  Chest pain completely resolved with nitro.  It has been increasing in frequency.  High concern for cardiac etiology.  She has no infectious symptoms such as fever, cough to suggest pneumonia.  Chest x-ray is negative.  I considered PE less likely as she has no risk factors, pleuritic component, tachycardia, tachypnea or hypoxia.  Nuclear stress test 2017 w/o ischemia. I do not see LHC recently. Chronicity of 2 weeks makes dissection less likely.  Creatinine at baseline.  EKG with left bundle branch block but no ST elevations, ST depressions or T wave inversions.  Troponins undetectable.  No significant anemia.  Chest x-ray without abnormalities.  Given her risk I will discuss with medicine team for admission of chest pain rule out/serial troponins. Discussed with ED MD.   Final Clinical Impressions(s) / ED Diagnoses   Final diagnoses:  Chest pain of uncertain etiology    ED Discharge Orders    None       Kinnie Feil, PA-C 11/26/18 1552    Gareth Morgan, MD 11/28/18 2216

## 2018-11-26 NOTE — ED Triage Notes (Signed)
Pt reports she took 1 nitro yesterday and it relieved her pain completely. Pt denies pain at this time.

## 2018-11-26 NOTE — ED Notes (Signed)
ED Provider at bedside. 

## 2018-11-26 NOTE — Telephone Encounter (Signed)
Pt called to report that she has been having a stabbing pain under her left breast and it radiates to her mid sternal area multiple times a day over the past week.. she says it happens at rest and with walking through her house... she denies dizziness, nausea, sob associated with it.. she says it usually lasts 30 minutes and relieved with nitro... she dose not know what her BP has been running. She says her husband is very concerned and would like her to be seen.. she says the oast pain was last night and it lasted 20 minutes but better after 1 nitro... she is feeling well today.. I advised her about calling EMS and/ or going to the ER when she has pain and no relief after Nitro or symptoms worsen..  Will talk with the DOD for advice.   Spoke with Dr. Meda Coffee (DOD) and pt advised that she recommends that she have her husband take her to the ER for assessment and pt verbalized understanding and agreed.

## 2018-11-26 NOTE — ED Notes (Signed)
Pt to xray

## 2018-11-26 NOTE — H&P (Signed)
History and Physical    Cindy [name] ALP:[id number] DOB: [birth year] DOA: 11/26/2018  PCP: Cindy Cruel, MD   Cardiologist: Dr. Marlou [name] Patient coming from: Home  I have personally briefly reviewed patient's old medical records in [place]  Chief Complaint: Chest pain  HPI: Cindy [name] is a 85 y.o. female with medical history significant of coronary artery disease status post coronary artery bypass graft at 85 years old after a myocardial infarction, hypertension, hyperlipidemia, interstitial lung disease, chronic back pain, and arthritis who had an unremarkable stress test in 2017.  She presents here for evaluation of chest pain.  Chest pain began approximately 2 weeks ago and is associated with severe fatigue.  The pain is described as sweet squeezing.  It starts in the left breast and radiates to the sternum.  It is been more frequent over the last couple days.  She takes nitroglycerin which completely resolves her pain.  Sometimes the pain is brief and resolve spontaneously without intervention.  Has not noticed a pattern to it it is not associated with eating sometimes it occurs at rest.  Pain is mild at best.  Husband reports that similar to when she had her heart attack in 2008.  They were in [place] when that happened.  No seated symptoms include leg weakness that she states is not new for her and relates to her chronic back pain.  (Review of Dr. Marlou [name] records back a year ago show concerns about the leg pain).  Reports a 10 pound weight loss in 2 weeks, has had some chills and some clamminess.  She has chronic urinary frequency that is not changed.  Sent in by her cardiologist office after she called the office this morning.  Is no associated shortness of breath or palpitations.  No nausea, vomiting, or diaphoresis.  No infectious symptoms such as vomiting, diarrhea, abdominal pain, dysuria, hematuria, cough, sputum production or shortness of breath.  No prior history of DVT  or PE, no recent surgery, estrogen use, history of cancer lower extremity edema calf pain.  Did ride on a plane for 2 hours in December 2019.   ED Course: EKG unchanged, troponin reassuring, referred to [place] due to heart score of 4.  Review of Systems: As per HPI otherwise all other systems reviewed and  negative.    Past Medical History:  Diagnosis Date  . Asthma   . Bronchitis   . COPD (chronic obstructive pulmonary disease) ([place])   . Coronary artery disease    MI age 71  . GERD (gastroesophageal reflux disease)   . Heart attack ([place])   . Hypercholesteremia   . Hypertension   . Iron deficiency anemia    used to see hem in [place], [place].   [name] Renal insufficiency   . Thyroid disease     Past Surgical History:  Procedure Laterality Date  . APPENDECTOMY    . BREAST BIOPSY Right    benign  . CORONARY ANGIOPLASTY WITH STENT PLACEMENT    . CORONARY ARTERY BYPASS GRAFT  09/1979  . Moh's Surgeries     for squamous cell carcinoma  . PARTIAL HYSTERECTOMY  10/1978    Social History   Social History Narrative  . Not on file     reports that she quit smoking about 39 years ago. Her smoking use included cigarettes. She has a 33.00 pack-year smoking history. She has never used smokeless tobacco. She reports current alcohol use. She reports that she does not use drugs.  Allergies  Allergen Reactions  . Codeine Other (See Comments)    Headache from heavy doses of codeine only    Family History  Problem Relation Age of Onset  . Melanoma Sister        Cindy [name]  . Heart disease Father   . Heart disease Mother      Prior to Admission medications   Medication Sig Start Date End Date Taking? Authorizing Provider  aspirin 81 MG tablet Take 81 mg by mouth daily.     [provider]  atorvastatin (LIPITOR) 40 MG tablet Take 40 mg by mouth every evening.  12/29/15   [provider]  benzonatate (TESSALON) 200 MG capsule Take 1 capsule (200 mg total) by mouth 3 (three) times  daily as needed for cough. 05/23/18   Rigoberto Noel, MD  Calcium Carbonate-Vitamin D (CALCIUM 600+D) 600-200 MG-UNIT TABS Take 1 tablet by mouth 2 (two) times daily.    [provider]  ferrous sulfate 325 (65 FE) MG tablet Take 325 mg by mouth daily with breakfast.    [provider]  fexofenadine (ALLEGRA) 180 MG tablet Take 180 mg by mouth daily.    [provider]  GuaiFENesin (MUCINEX PO) Take 400 mg by mouth daily.    [provider]  ipratropium (ATROVENT) 0.03 % nasal spray Place 1 spray into both nostrils every 12 (twelve) hours. 05/23/18   Rigoberto Noel, MD  isosorbide mononitrate (IMDUR) 60 MG 24 hr tablet Take 1 tablet (60 mg total) by mouth daily. 12/10/13   Jerline Pain, MD  levothyroxine (SYNTHROID, LEVOTHROID) 150 MCG tablet Take 150 mcg by mouth daily before breakfast.     [provider]  losartan (COZAAR) 50 MG tablet Take 50 mg by mouth daily.    [provider]  metoprolol (TOPROL-XL) 50 MG 24 hr tablet Take 50 mg by mouth daily.      [provider]  Multiple Vitamins-Minerals (PRESERVISION AREDS 2 PO) Take 1 capsule by mouth 2 (two) times daily.    [provider]  nitroGLYCERIN (NITROSTAT) 0.4 MG SL tablet Place 1 tablet (0.4 mg total) under the tongue every 5 (five) minutes as needed for chest pain. 05/07/17   Jerline Pain, MD  pantoprazole (PROTONIX) 40 MG tablet TAKE 1 TABLET BY MOUTH TWICE DAILY 11/21/18   Rigoberto Noel, MD  Polyethyl Glycol-Propyl Glycol (SYSTANE OP) Apply 1-2 drops to eye at bedtime.    [provider]  spironolactone-hydrochlorothiazide (ALDACTAZIDE) 25-25 MG per tablet Take 1 tablet by mouth daily.      [provider]  traMADol (ULTRAM) 50 MG tablet Take 50 mg by mouth 2 (two) times daily. For pain. Maximum dose= 8 tablets per day    [provider]    Physical Exam:  Constitutional: NAD, calm, comfortable Vitals:   11/26/18 1344 11/26/18 1430    BP: (!) 157/72 (!) 154/58  Pulse: 64 (!) 56  Resp: 14 17  Temp: (!) 97.5 F (36.4 C)   TempSrc: Oral   SpO2: 96% 94%  Weight: 75.3 kg   Height: 5' (1.524 m)    Eyes: PERRL, lids and conjunctivae normal ENMT: Mucous membranes are moist. Posterior pharynx clear of any exudate or lesions.Normal dentition.  Neck: normal, supple, no masses, no thyromegaly Respiratory: clear to auscultation bilaterally, no wheezing, no crackles. Normal respiratory effort. No accessory muscle use.  Cardiovascular: Regular rate and rhythm, no murmurs / rubs / gallops. No extremity edema. 2+ pedal  pulses. No carotid bruits.  Abdomen: no tenderness, no masses palpated. No hepatosplenomegaly. Bowel sounds positive.  Musculoskeletal: no clubbing / cyanosis. No joint deformity upper and lower extremities. Good ROM, no contractures. Normal muscle tone.  Skin: no rashes, lesions, ulcers. No induration Neurologic: CN 2-12 grossly intact. Sensation intact, DTR normal. Strength 5/5 in all 4.  Psychiatric: Normal judgment and insight. Alert and oriented x 3. Normal mood.   Labs on Admission: I have personally reviewed following labs and imaging studies  CBC: Recent Labs  Lab 11/26/18 1353  WBC 6.4  HGB 11.4*  HCT 35.1*  MCV 95.9  PLT 751   Basic Metabolic Panel: Recent Labs  Lab 11/26/18 1353  NA 140  K 4.5  CL 106  CO2 23  GLUCOSE 110*  BUN 29*  CREATININE 1.44*  CALCIUM 10.2   GFR: Estimated Creatinine Clearance: 26.4 mL/min (A) (by C-G formula based on SCr of 1.44 mg/dL (H)). Urine analysis:    Component Value Date/Time   COLORURINE YELLOW 11/26/2018 1501   APPEARANCEUR CLEAR 11/26/2018 1501   LABSPEC 1.008 11/26/2018 1501   PHURINE 5.0 11/26/2018 1501   GLUCOSEU NEGATIVE 11/26/2018 1501   HGBUR NEGATIVE 11/26/2018 1501   BILIRUBINUR NEGATIVE 11/26/2018 1501   KETONESUR NEGATIVE 11/26/2018 1501   PROTEINUR NEGATIVE 11/26/2018 1501   NITRITE NEGATIVE 11/26/2018 1501   LEUKOCYTESUR TRACE  (A) 11/26/2018 1501    Radiological Exams on Admission: Dg Chest 2 View  Result Date: 11/26/2018 CLINICAL DATA:  Left chest pain in the left breast area radiating to the central chest for the past 2 weeks. Chronic productive cough. Ex-smoker. EXAM: CHEST - 2 VIEW COMPARISON:  Chest CT dated 05/23/2018 and chest radiographs dated 01/05/2017. FINDINGS: Normal sized heart. Stable scarring at the left lung base. Stable prominence of the interstitial markings with normal vasculature. Median sternotomy wires are again demonstrated with a broken inferior wire. Mild thoracic spine degenerative changes. Atheromatous aortic calcifications. Coronary artery stents. IMPRESSION: No acute finding. Stable chronic interstitial lung disease and left basilar scarring. Electronically Signed   By: Claudie Revering M.D.   On: 11/26/2018 14:17    EKG: Independently reviewed.  EKG shows sinus rhythm with left bundle branch block unchanged from 04 December 2017.  Assessment/Plan Principal Problem:   Angina decubitus (HCC) Active Problems:   Coronary atherosclerosis of native coronary artery   Renal insufficiency   ILD (interstitial lung disease) ([place])   Hyperlipidemia   Old MI (myocardial infarction)   Hypothyroidism   Unstable angina ([place])   1.  Angina decubitus/unstable angina: We will bring patient into the hospital for overnight observation.  We will check serial enzymes and repeat EKG in the morning.  I have sent a message to Birdie Sons regarding possibility of need for cardiac evaluation.  Echocardiogram is ordered.  Patient is n.p.o. after midnight.  2.  Coronary atherosclerosis of native coronary artery with history of prior myocardial infarction: Patient status post coronary artery bypass graft at 85 years old.  Has had stent placement.  Nuclear stress test in 2017 showed an EF of 62% with a low risk study that was normal.  3.  Renal insufficiency: Appears to be chronic 1 year ago her creatinine was 1.32.   Today it is 1.44.  4.  Interstitial lung disease: Followed by Dr. Elsworth Soho.  See his note.  5.  Hyperlipidemia: Continue home medication regimen.  6.  Hypothyroidism: Continue levothyroxine.    DVT prophylaxis: Subcu heparin Code Status: Full code Family Communication: Husband  who was present at the bedside.  Patient retains capacity. Disposition Plan: Likely home 24 to 48 hours Consults called: Cardiology message sent to Birdie Sons Admission status: Observation   Lady Deutscher MD [facility] Pager [phone]  How to contact the [facility] Attending or Consulting provider Emma or covering provider during after hours [place], for this patient?  1. Check the care team in [facility] and look for a) attending/consulting [facility] provider listed and b) the [facility] team listed 2. Log into [email or web address] and use [place]'s universal password to access. If you do not have the password, please contact the hospital operator. 3. Locate the [facility] provider you are looking for under [facility] and page to a number that you can be directly reached. 4. If you still have difficulty reaching the provider, please page the [facility] (Director on Call) for the Hospitalists listed on amion for assistance.  If 7PM-7AM, please contact night-coverage [email or web address] Password [email or web address]  11/26/2018, 4:04 PM

## 2018-11-26 NOTE — ED Notes (Signed)
Pt ambulatory to the bathroom without assistance.  

## 2018-11-27 ENCOUNTER — Observation Stay (HOSPITAL_BASED_OUTPATIENT_CLINIC_OR_DEPARTMENT_OTHER): Payer: Medicare Other

## 2018-11-27 ENCOUNTER — Encounter (HOSPITAL_COMMUNITY)
Admission: EM | Disposition: A | Discharge status: Home / Self Care | Payer: Self-pay | Source: Home / Self Care | Attending: Emergency Medicine

## 2018-11-27 DIAGNOSIS — I25700 Atherosclerosis of coronary artery bypass graft(s), unspecified, with unstable angina pectoris: Secondary | ICD-10-CM | POA: Diagnosis not present

## 2018-11-27 DIAGNOSIS — I080 Rheumatic disorders of both mitral and aortic valves: Secondary | ICD-10-CM | POA: Diagnosis not present

## 2018-11-27 DIAGNOSIS — R0789 Other chest pain: Secondary | ICD-10-CM

## 2018-11-27 DIAGNOSIS — I2510 Atherosclerotic heart disease of native coronary artery without angina pectoris: Secondary | ICD-10-CM | POA: Diagnosis not present

## 2018-11-27 DIAGNOSIS — I208 Other forms of angina pectoris: Secondary | ICD-10-CM

## 2018-11-27 DIAGNOSIS — I129 Hypertensive chronic kidney disease with stage 1 through stage 4 chronic kidney disease, or unspecified chronic kidney disease: Secondary | ICD-10-CM | POA: Diagnosis not present

## 2018-11-27 DIAGNOSIS — N183 Chronic kidney disease, stage 3 (moderate): Secondary | ICD-10-CM | POA: Diagnosis not present

## 2018-11-27 DIAGNOSIS — R079 Chest pain, unspecified: Secondary | ICD-10-CM | POA: Insufficient documentation

## 2018-11-27 HISTORY — PX: LEFT HEART CATH AND CORS/GRAFTS ANGIOGRAPHY: CATH118250

## 2018-11-27 LAB — BASIC METABOLIC PANEL
Anion gap: 16 — ABNORMAL HIGH (ref 5–15)
BUN: 21 mg/dL (ref 8–23)
CO2: 21 mmol/L — ABNORMAL LOW (ref 22–32)
Calcium: 10.3 mg/dL (ref 8.9–10.3)
Chloride: 105 mmol/L (ref 98–111)
Creatinine, Ser: 1.25 mg/dL — ABNORMAL HIGH (ref 0.44–1.00)
GFR, EST AFRICAN AMERICAN: 46 mL/min — AB (ref >60)
GFR, EST NON AFRICAN AMERICAN: 39 mL/min — AB (ref >60)
Glucose, Bld: 114 mg/dL — ABNORMAL HIGH (ref 70–99)
Potassium: 4.5 mmol/L (ref 3.5–5.1)
Sodium: 142 mmol/L (ref 135–145)

## 2018-11-27 LAB — ECHOCARDIOGRAM COMPLETE
Height: 60 in
Weight: 1836.8 oz

## 2018-11-27 LAB — GLUCOSE, CAPILLARY: Glucose-Capillary: 90 mg/dL (ref 70–99)

## 2018-11-27 SURGERY — LEFT HEART CATH AND CORS/GRAFTS ANGIOGRAPHY
Anesthesia: LOCAL

## 2018-11-27 MED ORDER — ASPIRIN 81 MG PO CHEW: 81.0000 mg | CHEWABLE_TABLET | ORAL | Status: DC

## 2018-11-27 MED ORDER — SODIUM CHLORIDE 0.9 % IV SOLN: INTRAVENOUS | Status: AC

## 2018-11-27 MED ORDER — ASPIRIN 81 MG PO TABS: 81.0000 mg | ORAL_TABLET | Freq: Every day | ORAL | Status: DC

## 2018-11-27 MED ORDER — TRAMADOL HCL 50 MG PO TABS
50.0000 mg | ORAL_TABLET | Freq: Two times a day (BID) | ORAL | Status: DC
  Filled 2018-11-27: qty 1

## 2018-11-27 MED ORDER — SODIUM CHLORIDE 0.9% FLUSH: 3.0000 mL | INTRAVENOUS | Status: DC | PRN

## 2018-11-27 MED ORDER — ASPIRIN 81 MG PO CHEW: 81.0000 mg | CHEWABLE_TABLET | Freq: Every day | ORAL | Status: DC

## 2018-11-27 MED ORDER — PANTOPRAZOLE SODIUM 40 MG PO TBEC
40.0000 mg | DELAYED_RELEASE_TABLET | Freq: Two times a day (BID) | ORAL | Status: DC
  Filled 2018-11-27: qty 1

## 2018-11-27 MED ORDER — SODIUM CHLORIDE 0.9 % WEIGHT BASED INFUSION: 1.0000 mL/kg/h | INTRAVENOUS | Status: DC

## 2018-11-27 MED ORDER — HYDROCHLOROTHIAZIDE 25 MG PO TABS: 25.0000 mg | ORAL_TABLET | Freq: Every day | ORAL | Status: DC

## 2018-11-27 MED ORDER — SPIRONOLACTONE-HCTZ 25-25 MG PO TABS: 1.0000 | ORAL_TABLET | Freq: Every day | ORAL | Status: DC

## 2018-11-27 MED ORDER — ONDANSETRON HCL 4 MG/2ML IJ SOLN: 4.0000 mg | Freq: Four times a day (QID) | INTRAMUSCULAR | Status: DC | PRN

## 2018-11-27 MED ORDER — SODIUM CHLORIDE 0.9 % IV SOLN: 250.0000 mL | INTRAVENOUS | Status: DC | PRN

## 2018-11-27 MED ORDER — FAMOTIDINE 20 MG PO TABS
20.0000 mg | ORAL_TABLET | Freq: Every day | ORAL | Status: DC
  Filled 2018-11-27: qty 1

## 2018-11-27 MED ORDER — HEPARIN (PORCINE) IN NACL 1000-0.9 UT/500ML-% IV SOLN
INTRAVENOUS | Status: AC
  Filled 2018-11-27: qty 1500

## 2018-11-27 MED ORDER — LIDOCAINE HCL (PF) 1 % IJ SOLN
INTRAMUSCULAR | Status: DC | PRN
  Administered 2018-11-27: 15 mL

## 2018-11-27 MED ORDER — SODIUM CHLORIDE 0.9% FLUSH: 3.0000 mL | Freq: Two times a day (BID) | INTRAVENOUS | Status: DC

## 2018-11-27 MED ORDER — FENTANYL CITRATE (PF) 100 MCG/2ML IJ SOLN
INTRAMUSCULAR | Status: AC
  Filled 2018-11-27: qty 2

## 2018-11-27 MED ORDER — ATORVASTATIN CALCIUM 40 MG PO TABS
40.0000 mg | ORAL_TABLET | Freq: Every evening | ORAL | Status: DC
  Filled 2018-11-27: qty 1

## 2018-11-27 MED ORDER — LEVOTHYROXINE SODIUM 150 MCG PO TABS
150.0000 ug | ORAL_TABLET | Freq: Every day | ORAL | Status: DC
  Filled 2018-11-27: qty 1
  Filled 2018-11-27: qty 2

## 2018-11-27 MED ORDER — LOSARTAN POTASSIUM 50 MG PO TABS
50.0000 mg | ORAL_TABLET | Freq: Every day | ORAL | Status: DC
  Filled 2018-11-27: qty 1

## 2018-11-27 MED ORDER — MIDAZOLAM HCL 2 MG/2ML IJ SOLN
INTRAMUSCULAR | Status: DC | PRN
  Administered 2018-11-27: 0.5 mg via INTRAVENOUS

## 2018-11-27 MED ORDER — ACETAMINOPHEN 325 MG PO TABS
650.0000 mg | ORAL_TABLET | ORAL | Status: DC | PRN
  Administered 2018-11-28: 650 mg via ORAL
  Filled 2018-11-27: qty 2

## 2018-11-27 MED ORDER — SPIRONOLACTONE 25 MG PO TABS
25.0000 mg | ORAL_TABLET | Freq: Every day | ORAL | Status: DC
  Filled 2018-11-27: qty 1

## 2018-11-27 MED ORDER — METOPROLOL SUCCINATE ER 50 MG PO TB24
50.0000 mg | ORAL_TABLET | Freq: Every day | ORAL | Status: DC
  Filled 2018-11-27: qty 1

## 2018-11-27 MED ORDER — ISOSORBIDE MONONITRATE ER 60 MG PO TB24
60.0000 mg | ORAL_TABLET | Freq: Every day | ORAL | Status: DC
  Filled 2018-11-27: qty 1

## 2018-11-27 MED ORDER — IOHEXOL 350 MG/ML SOLN
INTRAVENOUS | Status: DC | PRN
  Administered 2018-11-27: 70 mL via INTRA_ARTERIAL

## 2018-11-27 MED ORDER — NITROGLYCERIN 0.4 MG SL SUBL: 0.4000 mg | SUBLINGUAL_TABLET | SUBLINGUAL | Status: DC | PRN

## 2018-11-27 MED ORDER — SODIUM CHLORIDE 0.9 % WEIGHT BASED INFUSION
3.0000 mL/kg/h | INTRAVENOUS | Status: DC
  Administered 2018-11-27: 3 mL/kg/h via INTRAVENOUS

## 2018-11-27 MED ORDER — LIDOCAINE HCL (PF) 1 % IJ SOLN
INTRAMUSCULAR | Status: AC
  Filled 2018-11-27: qty 30

## 2018-11-27 MED ORDER — HEPARIN (PORCINE) IN NACL 1000-0.9 UT/500ML-% IV SOLN
INTRAVENOUS | Status: DC | PRN
  Administered 2018-11-27 (×3): 500 mL

## 2018-11-27 MED ORDER — MIDAZOLAM HCL 2 MG/2ML IJ SOLN
INTRAMUSCULAR | Status: AC
  Filled 2018-11-27: qty 2

## 2018-11-27 MED ORDER — FENTANYL CITRATE (PF) 100 MCG/2ML IJ SOLN
INTRAMUSCULAR | Status: DC | PRN
  Administered 2018-11-27: 25 ug via INTRAVENOUS

## 2018-11-27 SURGICAL SUPPLY — 11 items
CATH INFINITI 5FR MULTPACK ANG (CATHETERS) ×1 IMPLANT
CLOSURE MYNX CONTROL 5F (Vascular Products) ×1 IMPLANT
KIT HEART LEFT (KITS) ×2 IMPLANT
PACK CARDIAC CATHETERIZATION (CUSTOM PROCEDURE TRAY) ×2 IMPLANT
PROTECTION STATION PRESSURIZED (MISCELLANEOUS) ×2
SHEATH PINNACLE 5F 10CM (SHEATH) ×1 IMPLANT
STATION PROTECTION PRESSURIZED (MISCELLANEOUS) IMPLANT
SYR MEDRAD MARK 7 150ML (SYRINGE) ×2 IMPLANT
TRANSDUCER W/STOPCOCK (MISCELLANEOUS) ×2 IMPLANT
TUBING CIL FLEX 10 FLL-RA (TUBING) ×2 IMPLANT
WIRE EMERALD 3MM-J .035X150CM (WIRE) ×1 IMPLANT

## 2018-11-27 NOTE — Progress Notes (Signed)
Rt Groin cath site CD & I. Assisted OOB to BR. Tolerated well. No s/s bleed or hematoma. Denies pain at site.

## 2018-11-27 NOTE — Progress Notes (Signed)
Refuses to take Meds supplied by hospital. Say they will take meds when they get home tomorrow. Pt and husband notified that it is against hospital policy to take meds from home. Verbalized understanding.

## 2018-11-27 NOTE — Consult Note (Addendum)
CARDIOLOGY CONSULT NOTE    Patient ID: Cindy [name]; [id number]; 05/20/1932   Admit date: 11/26/2018 Date of Consult: 11/27/2018  Primary Care Provider: Lawerance Cruel, MD Primary Cardiologist: Candee Furbish, MD Primary Electrophysiologist:  None   Patient Profile:   Cindy [name] is a 85 y.o. female with a hx of CAD s/p 1 vessel CABG and 3 DES placement who is being seen today for the evaluation of chest pain .  History of Present Illness:   Cindy [name] states that for the past 1-2 weeks she had experienced intermittent "jabbing" chest pain. The pain starts under her left breast and radiates to her sternum. These episodes occur several times per day and are typically relieved by SL nitro. They occur with both exertion and rest. She does not have any associated symptoms. Prior to the onset of these symptoms she was able to walk around the grocery store without the need to rest due to SOB or CP. She thinks this pain is different from her prior CP. Her last cath was about 10 years ago. She has had a 1 vessel CABG and 3 DES stents placed. She follows with Dr. Marlou Porch and tells me that she would like him to be apart of the decision as to next steps in work-up. She has not ben sick recently but does have a chronic cough. She noted some weight loss but otherwise denies diaphoresis, N/V, visual changes, palpitations.   Past Medical History:  Diagnosis Date  . Asthma   . Bronchitis   . COPD (chronic obstructive pulmonary disease) ([place])   . Coronary artery disease    MI age 61  . GERD (gastroesophageal reflux disease)   . Heart attack ([place])   . Hypercholesteremia   . Hypertension   . Iron deficiency anemia    used to see hem in [place], [place].   [name] Renal insufficiency   . Thyroid disease    Past Surgical History:  Procedure Laterality Date  . APPENDECTOMY    . BREAST BIOPSY Right    benign  . CORONARY ANGIOPLASTY WITH STENT PLACEMENT    . CORONARY ARTERY BYPASS GRAFT  09/1979  .  Moh's Surgeries     for squamous cell carcinoma  . PARTIAL HYSTERECTOMY  10/1978    Home Medications:  Prior to Admission medications   Medication Sig Start Date End Date Taking? Authorizing Provider  aspirin 81 MG tablet Take 81 mg by mouth daily.    Yes [provider]  atorvastatin (LIPITOR) 40 MG tablet Take 40 mg by mouth every evening.  12/29/15  Yes [provider]  benzonatate (TESSALON) 200 MG capsule Take 1 capsule (200 mg total) by mouth 3 (three) times daily as needed for cough. Patient taking differently: Take 200 mg by mouth at bedtime.  05/23/18  Yes Rigoberto Noel, MD  Calcium Carbonate-Vitamin D (CALCIUM 600+D) 600-200 MG-UNIT TABS Take 1 tablet by mouth 2 (two) times daily.   Yes [provider]  famotidine (PEPCID) 20 MG tablet Take 20 mg by mouth at bedtime as needed. Acid reflux 11/21/18  Yes [provider]  ferrous sulfate 325 (65 FE) MG tablet Take 325 mg by mouth daily with breakfast.   Yes [provider]  fexofenadine (ALLEGRA) 180 MG tablet Take 180 mg by mouth daily.   Yes [provider]  GuaiFENesin (MUCINEX PO) Take 400 mg by mouth daily.   Yes [provider]  ipratropium (ATROVENT) 0.03 % nasal spray  Place 1 spray into both nostrils every 12 (twelve) hours. Patient taking differently: Place 1 spray into both nostrils 2 (two) times daily as needed for rhinitis.  05/23/18  Yes Rigoberto Noel, MD  isosorbide mononitrate (IMDUR) 60 MG 24 hr tablet Take 1 tablet (60 mg total) by mouth daily. 12/10/13  Yes Jerline Pain, MD  levothyroxine (SYNTHROID, LEVOTHROID) 150 MCG tablet Take 150 mcg by mouth daily before breakfast.    Yes [provider]  losartan (COZAAR) 50 MG tablet Take 50 mg by mouth daily.   Yes [provider]  metoprolol (TOPROL-XL) 50 MG 24 hr tablet Take 50 mg by mouth daily.     Yes [provider]  Multiple Vitamins-Minerals (PRESERVISION AREDS 2 PO) Take 1 capsule by  mouth 2 (two) times daily.   Yes [provider]  nitroGLYCERIN (NITROSTAT) 0.4 MG SL tablet Place 1 tablet (0.4 mg total) under the tongue every 5 (five) minutes as needed for chest pain. 05/07/17  Yes Jerline Pain, MD  pantoprazole (PROTONIX) 40 MG tablet TAKE 1 TABLET BY MOUTH TWICE DAILY Patient taking differently: Take 40 mg by mouth 2 (two) times daily.  11/21/18  Yes Rigoberto Noel, MD  Polyethyl Glycol-Propyl Glycol (SYSTANE OP) Apply 1-2 drops to eye at bedtime.   Yes [provider]  spironolactone-hydrochlorothiazide (ALDACTAZIDE) 25-25 MG per tablet Take 1 tablet by mouth daily.     Yes [provider]  traMADol (ULTRAM) 50 MG tablet Take 50 mg by mouth 2 (two) times daily. For pain. Maximum dose= 8 tablets per day   Yes [provider]   Inpatient Medications: Scheduled Meds: . aspirin EC  325 mg Oral Daily  . heparin  5,000 Units Subcutaneous Q8H   Continuous Infusions:  PRN Meds: acetaminophen, antiseptic oral rinse, morphine injection, ondansetron (ZOFRAN) IV, zolpidem  Allergies:    Allergies  Allergen Reactions  . Codeine Other (See Comments)    Headache from heavy doses of codeine only   Social History:   Social History   Socioeconomic History  . Marital status: Married    Spouse name: Not on file  . Number of children: 2  . Years of education: Not on file  . Highest education level: Not on file  Occupational History  . Occupation: retired    Comment: retired  Scientific laboratory technician  . Financial resource strain: Not on file  . Food insecurity:    Worry: Not on file    Inability: Not on file  . Transportation needs:    Medical: Not on file    Non-medical: Not on file  Tobacco Use  . Smoking status: Former Smoker    Packs/day: 1.50    Years: 22.00    Pack years: 33.00    Types: Cigarettes    Last attempt to quit: 10/23/1979    Years since quitting: 39.1  . Smokeless tobacco: Never Used  Substance and Sexual Activity  .  Alcohol use: Yes    Comment: occasional  . Drug use: No  . Sexual activity: Not on file  Lifestyle  . Physical activity:    Days per week: Not on file    Minutes per session: Not on file  . Stress: Not on file  Relationships  . Social connections:    Talks on phone: Not on file    Gets together: Not on file    Attends religious service: Not on file    Active member of club or organization: Not  on file    Attends meetings of clubs or organizations: Not on file    Relationship status: Not on file  . Intimate partner violence:    Fear of current or ex partner: Not on file    Emotionally abused: Not on file    Physically abused: Not on file    Forced sexual activity: Not on file  Other Topics Concern  . Not on file  Social History Narrative  . Not on file    Family History:   Family History  Problem Relation Age of Onset  . Melanoma Sister        x2  . Heart disease Father   . Heart disease Mother     ROS:  Performed and all others negative.  Physical Exam/Data:   Vitals:   11/26/18 1705 11/26/18 2016 11/27/18 0542 11/27/18 0657  BP: (!) 152/88 (!) 146/62  (!) 138/53  Pulse: (!) 58 63  69  Resp: 18     Temp: 97.9 F (36.6 C) 97.6 F (36.4 C)  97.6 F (36.4 C)  TempSrc: Oral Oral  Oral  SpO2: 97% 96%  100%  Weight: 76.6 kg  52.1 kg   Height: 5' (1.524 m)       Intake/Output Summary (Last 24 hours) at 11/27/2018 0759 Last data filed at 11/26/2018 2300 Gross per 24 hour  Intake 600 ml  Output -  Net 600 ml   Filed Weights   11/26/18 1344 11/26/18 1705 11/27/18 0542  Weight: 75.3 kg 76.6 kg 52.1 kg   Body mass index is 22.42 kg/m.   General: Elderly female in no acute distress HENT: Normocephalic, atraumatic, moist mucus membranes Pulm: Good air movement with no wheezing or crackles  CV: RRR, no murmurs, no rubs  Abdomen: Active bowel sounds, soft, non-distended, no tenderness to palpation  Extremities: Pulses palpable in all extremities, no LE edema    Skin: Warm and dry  Neuro: Alert and oriented x 3  EKG:  The EKG was personally reviewed and demonstrates: Sinus rhythm with LBBB and ST depression in leads V5-V6. Repeat this AM with upright T waves in leads V5-V6.   Telemetry:  Telemetry was personally reviewed and demonstrates: Sinus rhythm with LBBB  Relevant CV Studies:  Lexiscan 2017   Nuclear stress EF: 62%.  The study is normal.  This is a low risk study.  The left ventricular ejection fraction is normal (55-65%). Paradoxical septal motion consistent with LBBB.  Laboratory Data:  Chemistry Recent Labs  Lab 11/26/18 1353 11/26/18 1707  NA 140  --   K 4.5  --   CL 106  --   CO2 23  --   GLUCOSE 110*  --   BUN 29*  --   CREATININE 1.44* 1.41*  CALCIUM 10.2  --   GFRNONAA 33* 34*  GFRAA 39* 40*  ANIONGAP 11  --     No results for input(s): PROT, ALBUMIN, AST, ALT, ALKPHOS, BILITOT in the last 168 hours.   Hematology Recent Labs  Lab 11/26/18 1353 11/26/18 1707  WBC 6.4 6.0  RBC 3.66* 3.78*  HGB 11.4* 11.7*  HCT 35.1* 35.9*  MCV 95.9 95.0  MCH 31.1 31.0  MCHC 32.5 32.6  RDW 12.2 12.2  PLT 178 167   Cardiac Enzymes Recent Labs  Lab 11/26/18 1707 11/26/18 1844 11/26/18 2137  TROPONINI <0.03 <0.03 <0.03    Recent Labs  Lab 11/26/18 1354  TROPIPOC 0.00    BNPNo results for input(s): BNP, PROBNP  in the last 168 hours.  DDimer No results for input(s): DDIMER in the last 168 hours.  Radiology/Studies:  Dg Chest 2 View  Result Date: 11/26/2018 CLINICAL DATA:  Left chest pain in the left breast area radiating to the central chest for the past 2 weeks. Chronic productive cough. Ex-smoker. EXAM: CHEST - 2 VIEW COMPARISON:  Chest CT dated 05/23/2018 and chest radiographs dated 01/05/2017. FINDINGS: Normal sized heart. Stable scarring at the left lung base. Stable prominence of the interstitial markings with normal vasculature. Median sternotomy wires are again demonstrated with a broken inferior wire.  Mild thoracic spine degenerative changes. Atheromatous aortic calcifications. Coronary artery stents. IMPRESSION: No acute finding. Stable chronic interstitial lung disease and left basilar scarring. Electronically Signed   By: Claudie Revering M.D.   On: 11/26/2018 14:17   Assessment and Plan:   Cindy [name] is a 85 y.o. female with a hx of CAD s/p 1 vessel CABG and 4 DES placement who is being seen today for the evaluation of atypical chest pain.   Atypical Chest pain  CAD s/p 1 vessel CABG and 3 DES placement - No further chest pain since admission  - Troponins have remained negative  - Repeat EKG this AM does show some ST changes in leads V5-V6  - ACS ruled out but patient with high risk CAD. Although stress was negative in 2017 I think she would benefit from repeat Cath. Patient unsure if she would like to pursue this. Would like to discuss with Dr. Marlou Porch.  - Continue ASA 81 mg QD, Atorvastatin 40 mg QD, Metoprolol succinate 50 mg QD, Losartan 50 mg QD, Imdur 60 mg QD, and Spironolactone 25 mg QD - Could add plavix   Hypertension  - Goal <130/80  - Continue  Metoprolol succinate 50 mg QD, Losartan 50 mg QD, Imdur 60 mg QD, and Spironolactone 25 mg QD  For questions or updates, please contact [facility] Please consult [email or web address] for contact info under Cardiology/STEMI.   Signed, Ina Homes, MD 11/27/2018 7:59 AM  ---------------------------------------------------------------------------------------------   History and all data above reviewed.  Patient examined.  I agree with the findings as above.  Cindy [name] is a pleasant 85 yo female with past history of CAD with 1 vessel bypass (RCA graft) and subsequent DES who presents with nitro responsive chest pain that has been increasing over the past 2-3 weeks. She takes nitro for any chest pain including GERD. She feels this chest pain is different from her typical angina which is more like indigestion.  Constitutional:  No acute distress Eyes: pupils equally round and reactive to light, sclera non-icteric, normal conjunctiva and lids ENMT: normal dentition, moist mucous membranes Cardiovascular: regular rhythm, normal rate, no murmurs. S1 and S2 normal. Radial pulses normal bilaterally. No jugular venous distention.  Respiratory: clear to auscultation bilaterally GI : normal bowel sounds, soft and nontender. No distention.   MSK: extremities warm, well perfused. No edema.  NEURO: grossly nonfocal exam, moves all extremities. PSYCH: alert and oriented x 3, normal mood and affect.   All available labs, radiology testing, previous records reviewed. Agree with documented assessment and plan of my colleague as stated above with the following additions or changes:  Principal Problem:   Angina decubitus ([place]) Active Problems:   Renal insufficiency   ILD (interstitial lung disease) ([place])   Coronary atherosclerosis of native coronary artery   Old MI (myocardial infarction)   Hyperlipidemia   Hypothyroidism   Unstable angina ([place])  Plan: plan for coronary angiography today for progressive nitro responsive angina. We have discussed this in great detail. She is already on antianginal therapy, and still having to take several nitro a day.   INFORMED CONSENT: I have reviewed the risks, indications, and alternatives to cardiac catheterization, possible angioplasty, and stenting with the patient. Risks include but are not limited to bleeding, infection, vascular injury, stroke, myocardial infection, arrhythmia, kidney injury, radiation-related injury in the case of prolonged fluoroscopy use, emergency cardiac surgery, and death. The patient understands the risks of serious complication is 1-2 in 0379 with diagnostic cardiac cath and 1-2% or less with angioplasty/stenting.   I have instructed the patient that dual antiplatelet therapy should be taken for 1 year without interruption.  We have discussed the consequences  of interrupted dual antiplatelet therapy and the risk for in-stent thrombosis.    Time Spent Directly with Patient:  I have spent a total of 80 minutes with the patient reviewing hospital notes, telemetry, EKGs, labs and examining the patient as well as establishing an assessment and plan that was discussed personally with the patient.  > 50% of time was spent in direct patient care.  Length of Stay:  LOS: 0 days   Elouise Munroe, MD [facility] 1:12 PM  11/27/2018

## 2018-11-27 NOTE — Progress Notes (Signed)
  Echocardiogram 2D Echocardiogram has been performed.  Shantoya Geurts L Androw 11/27/2018, 11:14 AM

## 2018-11-27 NOTE — Progress Notes (Signed)
Nutrition Brief Note  Patient identified on the Malnutrition Screening Tool (MST) Report. Patient has lost weight over the past 6-12 months. Weight loss has been gradual, not significant for the time frame.   Nutrition focused physical exam completed.  No muscle or subcutaneous fat depletion noticed.   Wt Readings from Last 15 Encounters:  11/27/18 73.9 kg  05/23/18 78.3 kg  12/04/17 83.3 kg  09/27/17 81.6 kg  05/02/17 83.1 kg  01/07/17 84.9 kg  12/11/16 85.4 kg  03/27/16 83.6 kg  02/21/16 81.2 kg  09/23/15 82.7 kg  04/01/15 83.9 kg  03/04/15 86.1 kg  09/08/14 90.4 kg  09/03/14 81.2 kg  07/23/14 90.4 kg    Body mass index is 31.82 kg/m. Patient meets criteria for obesity based on current BMI.   Current diet order is NPO for procedure. Labs and medications reviewed.   No nutrition interventions warranted at this time. If nutrition issues arise, please consult RD.   Molli Barrows, RD, LDN, [place] Pager [phone] After Hours Pager [phone]

## 2018-11-27 NOTE — Care Management Obs Status (Signed)
[place] NOTIFICATION   Patient Details  Name: Cindy [name] MRN: [id number] Date of Birth: [birth year]   Medicare Observation Status Notification Given:  Yes    Dawayne Patricia, RN 11/27/2018, 4:12 PM

## 2018-11-27 NOTE — Progress Notes (Addendum)
[facility] PROGRESS NOTE  Cindy [name] YBO:[id number] DOB: [birth year] DOA: 11/26/2018 PCP: Lawerance Cruel, MD  Assessment/Plan:  1.  Unstable angina/chest pain: some typical and atypical features. Resolved this am. Troponin negative, EKG this am with ST changes per cardiology.   Echocardiogram reveals EF 55%. Provided with asa. Evaluated by cardiology who recommend cath and patient taken to cath lab for left heart cath revealing an occluded dominant RCA, occluded RCA vein graft with left-to-right collaterals and normal LV function.  Medical therapy will be recommended.  A minx closure device was successfully deployed.  The patient left lab in stable condition.  She will be gently hydrated and most likely discharge home tomorrow on antianginal medications. -medications changes per cardiology -gentle iv fluids -defer further management to cards  2.  Coronary atherosclerosis of native coronary artery with history of prior myocardial infarction: Patient status post coronary artery bypass graft at 85 years old.  Has had stent placement.  Nuclear stress test in 2017 showed an EF of 62% with a low risk study that was normal. -see #1  3.  Renal insufficiency: Appears to be chronic 1 year ago her creatinine was 1.32.  Today it is 1.25 trending down from yesterday -gently IV fluids -hold nephrotoxins -recheck in am  4.  Interstitial lung disease: appears stable at baseline. Followed by Dr. Elsworth Soho.  See his note.  5.  Hyperlipidemia: Continue home medication regimen.  6.  Hypothyroidism: Continue levothyroxine.  Procedures:  Echo  Left cardiac cath  Consultants: cardiology  HPI/Subjective: Cindy [name] is a 85 y.o. female with a hx of CAD s/p 1 vessel CABG and 4 DES placement who admitted for atypical chest pain.  She denies pain this am  Objective: Vitals:   11/27/18 1452 11/27/18 1530  BP: (!) 173/63 (!) 161/73  Pulse: 77 68  Resp: 19   Temp:    SpO2: 99% 97%     Intake/Output Summary (Last 24 hours) at 11/27/2018 1545 Last data filed at 11/27/2018 0900 Gross per 24 hour  Intake 600 ml  Output -  Net 600 ml   Filed Weights   11/26/18 1705 11/27/18 0542 11/27/18 1200  Weight: 76.6 kg 52.1 kg 73.9 kg    Exam:   General:  Awake alert oriented x3 no acute distress  Cardiovascular: rrr no mgr trace LE edema  Respiratory: normal effort BS clear bilaterally  Abdomen: flat soft +BS no guarding or rebounding  Musculoskeletal: joints without swelling/erythema   Data Reviewed: Basic Metabolic Panel: Recent Labs  Lab 11/26/18 1353 11/26/18 1707 11/27/18 1249  NA 140  --  142  K 4.5  --  4.5  CL 106  --  105  CO2 23  --  21*  GLUCOSE 110*  --  114*  BUN 29*  --  21  CREATININE 1.44* 1.41* 1.25*  CALCIUM 10.2  --  10.3   Liver Function Tests: No results for input(s): AST, ALT, ALKPHOS, BILITOT, PROT, ALBUMIN in the last 168 hours. No results for input(s): LIPASE, AMYLASE in the last 168 hours. No results for input(s): AMMONIA in the last 168 hours. CBC: Recent Labs  Lab 11/26/18 1353 11/26/18 1707  WBC 6.4 6.0  HGB 11.4* 11.7*  HCT 35.1* 35.9*  MCV 95.9 95.0  PLT 178 167   Cardiac Enzymes: Recent Labs  Lab 11/26/18 1707 11/26/18 1844 11/26/18 2137  TROPONINI <0.03 <0.03 <0.03   BNP (last 3 results) No results for input(s): BNP in the last 8760  hours.  ProBNP (last 3 results) No results for input(s): PROBNP in the last 8760 hours.  CBG: No results for input(s): GLUCAP in the last 168 hours.  No results found for this or any previous visit (from the past 240 hour(s)).   Studies: Dg Chest 2 View  Result Date: 11/26/2018 CLINICAL DATA:  Left chest pain in the left breast area radiating to the central chest for the past 2 weeks. Chronic productive cough. Ex-smoker. EXAM: CHEST - 2 VIEW COMPARISON:  Chest CT dated 05/23/2018 and chest radiographs dated 01/05/2017. FINDINGS: Normal sized heart. Stable scarring at the  left lung base. Stable prominence of the interstitial markings with normal vasculature. Median sternotomy wires are again demonstrated with a broken inferior wire. Mild thoracic spine degenerative changes. Atheromatous aortic calcifications. Coronary artery stents. IMPRESSION: No acute finding. Stable chronic interstitial lung disease and left basilar scarring. Electronically Signed   By: Claudie Revering M.D.   On: 11/26/2018 14:17    Scheduled Meds: . [START ON 11/28/2018] aspirin  81 mg Oral Daily  . atorvastatin  40 mg Oral QPM  . famotidine  20 mg Oral QHS  . heparin  5,000 Units Subcutaneous Q8H  . spironolactone  25 mg Oral Daily   And  . hydrochlorothiazide  25 mg Oral Daily  . isosorbide mononitrate  60 mg Oral Daily  . [START ON 11/28/2018] levothyroxine  150 mcg Oral Q0600  . losartan  50 mg Oral Daily  . metoprolol succinate  50 mg Oral Daily  . pantoprazole  40 mg Oral BID  . sodium chloride flush  3 mL Intravenous Q12H  . traMADol  50 mg Oral BID   Continuous Infusions: . sodium chloride    . sodium chloride      Principal Problem:   Angina decubitus ([facility]) Active Problems:   ILD (interstitial lung disease) ([facility])   Coronary atherosclerosis of native coronary artery   Unstable angina ([facility])   Renal insufficiency   Old MI (myocardial infarction)   Hyperlipidemia   Hypothyroidism    Time spent: 46 minutes    [place] NP  [facility] If 7PM-7AM, please contact night-coverage at [email or web address], password [address] [place] 11/27/2018, 3:45 PM  LOS: 0 days      Patient was seen, examined,treatment plan was discussed with the Advance Practice Provider.  I have personally reviewed the clinical findings, labs, EKG, imaging studies and management of this patient in detail. I have also reviewed the orders written for this patient which were under my direction. I agree with the documentation, as recorded by the Advance Practice Provider.   Cindy [name] is a 85 y.o. female here  with chest pain and s/p cath.  Cath found: occluded dominant RCA, occluded RCA vein graft with left-to-right collaterals and normal LV function.  Medical therapy will be recommended.  ASA 81 mg. Plan is for IV hydration and probable discharge in the AM  Geradine Girt, DO  How to contact the [facility] Attending or Consulting provider [place] or covering provider during after hours [place], for this patient?  1. Check the care team in [facility] and look for a) attending/consulting [facility] provider listed and b) the [facility] team listed 2. Log into [email or web address] and use [place]'s universal password to access. If you do not have the password, please contact the hospital operator. 3. Locate the [facility] provider you are looking for under [facility] and page to a number that you can be directly reached. 4. If  you still have difficulty reaching the provider, please page the [facility] (Director on Call) for the Hospitalists listed on amion for assistance.

## 2018-11-27 NOTE — Interval H&P Note (Signed)
Cath Lab Visit (complete for each Cath Lab visit)  Clinical Evaluation Leading to the Procedure:   ACS: Yes.    Non-ACS:    Anginal Classification: CCS III  Anti-ischemic medical therapy: Maximal Therapy (2 or more classes of medications)  Non-Invasive Test Results: No non-invasive testing performed  Prior CABG: Previous CABG      History and Physical Interval Note:  11/27/2018 2:02 PM  Cindy [name]  has presented today for surgery, with the diagnosis of Chest Pain  The various methods of treatment have been discussed with the patient and family. After consideration of risks, benefits and other options for treatment, the patient has consented to  Procedure(s): LEFT HEART CATH AND CORS/GRAFTS ANGIOGRAPHY (N/A) as a surgical intervention .  The patient's history has been reviewed, patient examined, no change in status, stable for surgery.  I have reviewed the patient's chart and labs.  Questions were answered to the patient's satisfaction.     Quay Burow

## 2018-11-28 ENCOUNTER — Encounter (HOSPITAL_COMMUNITY): Payer: Self-pay | Admitting: Cardiovascular Disease

## 2018-11-28 DIAGNOSIS — I252 Old myocardial infarction: Secondary | ICD-10-CM | POA: Diagnosis not present

## 2018-11-28 DIAGNOSIS — E7800 Pure hypercholesterolemia, unspecified: Secondary | ICD-10-CM | POA: Diagnosis not present

## 2018-11-28 DIAGNOSIS — J849 Interstitial pulmonary disease, unspecified: Secondary | ICD-10-CM

## 2018-11-28 DIAGNOSIS — I25700 Atherosclerosis of coronary artery bypass graft(s), unspecified, with unstable angina pectoris: Secondary | ICD-10-CM | POA: Diagnosis not present

## 2018-11-28 DIAGNOSIS — I209 Angina pectoris, unspecified: Secondary | ICD-10-CM | POA: Diagnosis not present

## 2018-11-28 DIAGNOSIS — E039 Hypothyroidism, unspecified: Secondary | ICD-10-CM

## 2018-11-28 DIAGNOSIS — I25110 Atherosclerotic heart disease of native coronary artery with unstable angina pectoris: Secondary | ICD-10-CM | POA: Diagnosis not present

## 2018-11-28 DIAGNOSIS — N183 Chronic kidney disease, stage 3 (moderate): Secondary | ICD-10-CM

## 2018-11-28 DIAGNOSIS — R0789 Other chest pain: Secondary | ICD-10-CM

## 2018-11-28 DIAGNOSIS — I208 Other forms of angina pectoris: Secondary | ICD-10-CM | POA: Diagnosis not present

## 2018-11-28 LAB — CBC
HEMATOCRIT: 32.6 % — AB (ref 36.0–46.0)
Hemoglobin: 11 g/dL — ABNORMAL LOW (ref 12.0–15.0)
MCH: 31.5 pg (ref 26.0–34.0)
MCHC: 33.7 g/dL (ref 30.0–36.0)
MCV: 93.4 fL (ref 80.0–100.0)
Platelets: 159 10*3/uL (ref 150–400)
RBC: 3.49 MIL/uL — ABNORMAL LOW (ref 3.87–5.11)
RDW: 12.2 % (ref 11.5–15.5)
WBC: 6.4 10*3/uL (ref 4.0–10.5)
nRBC: 0 % (ref 0.0–0.2)

## 2018-11-28 LAB — BASIC METABOLIC PANEL
Anion gap: 14 (ref 5–15)
BUN: 18 mg/dL (ref 8–23)
CO2: 22 mmol/L (ref 22–32)
Calcium: 9.5 mg/dL (ref 8.9–10.3)
Chloride: 103 mmol/L (ref 98–111)
Creatinine, Ser: 1.24 mg/dL — ABNORMAL HIGH (ref 0.44–1.00)
GFR calc Af Amer: 46 mL/min — ABNORMAL LOW (ref >60)
GFR calc non Af Amer: 40 mL/min — ABNORMAL LOW (ref >60)
Glucose, Bld: 97 mg/dL (ref 70–99)
Potassium: 4 mmol/L (ref 3.5–5.1)
Sodium: 139 mmol/L (ref 135–145)

## 2018-11-28 MED ORDER — ISOSORBIDE MONONITRATE ER 120 MG PO TB24: 120.0000 mg | ORAL_TABLET | Freq: Every day | ORAL | 1 refills | Status: DC

## 2018-11-28 MED ORDER — ISOSORBIDE MONONITRATE ER 60 MG PO TB24: 90.0000 mg | ORAL_TABLET | Freq: Every day | ORAL | 1 refills | Status: DC

## 2018-11-28 NOTE — Progress Notes (Signed)
Patient received discharge information and acknowledged understanding of it. Patient IV was removed.  

## 2018-11-28 NOTE — Discharge Summary (Signed)
Cindy [name], is a 85 y.o. female  DOB 12-17-1931  MRN [id number].  Admission date:  11/26/2018  Admitting Physician  Lady Deutscher, MD  Discharge Date:  11/28/2018   Primary MD  Lawerance Cruel, MD  Recommendations for primary care physician for things to follow:     Discharge Diagnosis   Principal Problem:   Angina decubitus [facility]) Active Problems:   Renal insufficiency   ILD (interstitial lung disease) ([place])   Coronary atherosclerosis of native coronary artery   Old MI (myocardial infarction)   Hyperlipidemia   Hypothyroidism   Unstable angina [facility])      Past Medical History:  Diagnosis Date  . Asthma   . Bronchitis   . COPD (chronic obstructive pulmonary disease) ([place])   . Coronary artery disease    MI age 37  . GERD (gastroesophageal reflux disease)   . Heart attack ([place])   . Hypercholesteremia   . Hypertension   . Iron deficiency anemia    used to see hem in [place], [place].   [name] Renal insufficiency   . Thyroid disease     Past Surgical History:  Procedure Laterality Date  . APPENDECTOMY    . BREAST BIOPSY Right    benign  . CORONARY ANGIOPLASTY WITH STENT PLACEMENT    . CORONARY ARTERY BYPASS GRAFT  09/1979  . LEFT HEART CATH AND CORS/GRAFTS ANGIOGRAPHY N/A 11/27/2018   Procedure: LEFT HEART CATH AND CORS/GRAFTS ANGIOGRAPHY;  Surgeon: Lorretta Harp, MD;  Location: [place] [facility];  Service: Cardiovascular;  Laterality: N/A;  . Moh's Surgeries     for squamous cell carcinoma  . PARTIAL HYSTERECTOMY  10/1978       HPI  from the history and physical done on the day of admission:  Cindy [name] is a 85 y.o. female with medical history significant of coronary artery disease status post coronary artery bypass graft at 85 years old after a myocardial infarction, hypertension, hyperlipidemia,  interstitial lung disease, chronic back pain, and arthritis who had an unremarkable stress test in 2017.  She presents here for evaluation of chest pain.  Chest pain began approximately 2 weeks ago and is associated with severe fatigue.  The pain is described as sweet squeezing.  It starts in the left breast and radiates to the sternum.  It is been more frequent over the last couple days.  She takes nitroglycerin which completely resolves her pain.  Sometimes the pain is brief and resolve spontaneously without intervention.  Has not noticed a pattern to it it is not associated with eating sometimes it occurs at rest.  Pain is mild at best.  Husband reports that similar to when she had her heart attack in 2008.  They were in [place] when that happened.  No seated symptoms include leg weakness that she states is not new for her and relates to her chronic back pain.  (Review of Dr. Marlou Porch records back a year ago show concerns about the leg pain).  Reports a 10  pound weight loss in 2 weeks, has had some chills and some clamminess.  She has chronic urinary frequency that is not changed.  Sent in by her cardiologist office after she called the office this morning.  Is no associated shortness of breath or palpitations.  No nausea, vomiting, or diaphoresis.  No infectious symptoms such as vomiting, diarrhea, abdominal pain, dysuria, hematuria, cough, sputum production or shortness of breath.  No prior history of DVT or PE, no recent surgery, estrogen use, history of cancer lower extremity edema calf pain.  Did ride on a plane for 2 hours in December 2019.   ED Course: EKG unchanged, troponin reassuring, referred to [place] due to heart score of 4.    Hospital Course:   1.  Angina, Coronary artery disease: History of s/p CABG and 3 DES.  Troponins remain negative and EKG showed some ST changes in V5-V6.  Echocardiogram showed EF of 55 to 60%.  Patient underwent heart cath on 2/6 by Dr. Gwenlyn Found which revealed occluded  dominant RCA, occluded RCA vein graft with left to right collaterals and normal LV function.  Patient had been on aspirin, atorvastatin 40 mg daily, metoprolol 50 mg daily, losartan 50 mg daily, Imdur 60 mg daily, and spironolactone 25 mg daily.  She was recommended to continue medical management and was increased to Imdur 90 mg daily.  2.  Essential hypertension: Blood pressure seemed to be elevated during hospital stay, but she refused to take any medications from the hospital.  Patient was continued on current home medications of metoprolol, losartan, and spironolactone at discharge.  3.  Hyperlipidemia: No lipid panel was obtained during this hospitalization.  She was recommended to continued on atorvastatin 40 mg daily.  4.  Hypothyroidism: Patient was continued on levothyroxine.  Continue routine outpatient follow-up.  5.  Chronic kidney disease stage III: Stable.  Patient creatinine at baseline appears to range from 1.2-1.4 at baseline.  Following cardiac catheterization patient was given IV fluids overnight.  Prior to discharge creatinine noted to be 1.24.  6.  Interstitial lung disease: Patient recommended to continue outpatient follow-up with Dr. Elsworth Soho.  Follow UP  Follow-up Information    Jerline Pain, MD. Schedule an appointment as soon as possible for a visit in 1 month(s).   Specialty:  Cardiology Contact information: [address] [place] [address] [phone]        Lawerance Cruel, MD. Schedule an appointment as soon as possible for a visit in 1 week(s).   Specialty:  Family Medicine Contact information: [place] [address] [phone]            Consults obtained: Cardiology  Discharge Condition: Stable  Diet and Activity recommendation: See Discharge Instructions below  Discharge Instructions    Discharge instructions   Complete by:  As directed    Follow with Primary MD Lawerance Cruel, MD in 7 days     Get CBC and BMP-  checked  by Primary MD or SNF MD in 5-7 days ( we routinely change or add medications that can affect your baseline labs and fluid status, therefore we recommend that you get the mentioned basic workup next visit with your PCP, your PCP may decide not to get them or add new tests based on their clinical decision)  Activity: As tolerated with fall precautions use walker/cane & assistance as needed  Disposition: Home   Diet: Heart Healthy     For Heart failure patients -  Check your Weight same time everyday, if you gain over 2 pounds, or you develop in leg swelling, experience more shortness of breath or chest pain, call your Primary doctor immediately. Follow Cardiac Low Salt Diet and 1.5 lit/day fluid restriction.  Special Instructions: If you have smoked or chewed Tobacco  in the last 2 yrs please stop smoking, stop any regular Alcohol  and or any Recreational drug use.  On your next visit with your primary care physician please Get Medicines reviewed and adjusted.  Please request your Lawerance Cruel, MD to go over all Hospital Tests and Procedure/Radiological results at the follow up, please get all Hospital records sent to your Prim MD by signing hospital release before you go home.  If you experience worsening of your admission symptoms, develop shortness of breath, life threatening emergency, suicidal or homicidal thoughts you must seek medical attention immediately by calling 911 or calling your MD immediately  if symptoms less severe.  You Must read complete instructions/literature along with all the possible adverse reactions/side effects for all the Medicines you take and that have been prescribed to you. Take any new Medicines after you have completely understood and accpet all the possible adverse reactions/side effects.   Do not drive, operate heavy machinery, perform activities at heights, swimming or participation in water activities or provide baby sitting  services if your were admitted for syncope or siezures until you have seen by Primary MD or a Neurologist and advised to do so again.  Do not drive when taking Pain medications.  Do not take more than prescribed Pain, Sleep and Anxiety Medications  Wear Seat belts while driving.   Please note  You were cared for by a hospitalist during your hospital stay. If you have any questions about your discharge medications or the care you received while you were in the hospital after you are discharged, you can call the unit and asked to speak with the hospitalist on call if the hospitalist that took care of you is not available. Once you are discharged, your primary care physician will handle any further medical issues. Please note that NO REFILLS for any discharge medications will be authorized once you are discharged, as it is imperative that you return to your primary care physician (or establish a relationship with a primary care physician if you do not have one) for your aftercare needs so that they can reassess your need for medications and monitor your lab values.   Increase activity slowly   Complete by:  As directed         Discharge Medications     Allergies as of 11/28/2018      Reactions   Codeine Other (See Comments)   Headache from heavy doses of codeine only      Medication List    TAKE these medications   aspirin 81 MG tablet Take 81 mg by mouth daily.   atorvastatin 40 MG tablet Commonly known as:  LIPITOR Take 40 mg by mouth every evening.   benzonatate 200 MG capsule Commonly known as:  TESSALON Take 1 capsule (200 mg total) by mouth 3 (three) times daily as needed for cough. What changed:  when to take this   CALCIUM 600+D 600-200 MG-UNIT Tabs Generic drug:  Calcium Carbonate-Vitamin D Take 1 tablet by mouth 2 (two) times daily.   famotidine 20 MG tablet Commonly known as:  PEPCID Take 20 mg by mouth at bedtime as needed. Acid reflux   ferrous sulfate 325 (  65  FE) MG tablet Take 325 mg by mouth daily with breakfast.   fexofenadine 180 MG tablet Commonly known as:  ALLEGRA Take 180 mg by mouth daily.   ipratropium 0.03 % nasal spray Commonly known as:  ATROVENT Place 1 spray into both nostrils every 12 (twelve) hours. What changed:    when to take this  reasons to take this   isosorbide mononitrate 120 MG 24 hr tablet Commonly known as:  IMDUR Take 1 tablet (120 mg total) by mouth daily. What changed:    medication strength  how much to take   levothyroxine 150 MCG tablet Commonly known as:  SYNTHROID, LEVOTHROID Take 150 mcg by mouth daily before breakfast.   losartan 50 MG tablet Commonly known as:  COZAAR Take 50 mg by mouth daily.   metoprolol succinate 50 MG 24 hr tablet Commonly known as:  TOPROL-XL Take 50 mg by mouth daily.   MUCINEX PO Take 400 mg by mouth daily.   nitroGLYCERIN 0.4 MG SL tablet Commonly known as:  NITROSTAT Place 1 tablet (0.4 mg total) under the tongue every 5 (five) minutes as needed for chest pain.   pantoprazole 40 MG tablet Commonly known as:  PROTONIX TAKE 1 TABLET BY MOUTH TWICE DAILY   PRESERVISION AREDS 2 PO Take 1 capsule by mouth 2 (two) times daily.   spironolactone-hydrochlorothiazide 25-25 MG tablet Commonly known as:  ALDACTAZIDE Take 1 tablet by mouth daily.   SYSTANE OP Apply 1-2 drops to eye at bedtime.   traMADol 50 MG tablet Commonly known as:  ULTRAM Take 50 mg by mouth 2 (two) times daily. For pain. Maximum dose= 8 tablets per day       Major procedures and Radiology Reports - PLEASE review detailed and final reports for all details, in brief -   Echocardiogram 11/27/2016: Impressions  1. The left ventricle has normal systolic function of 63-78%. The cavity size was normal. There is mildly increased left ventricular wall thickness. Echo evidence of impaired diastolic relaxation.  2. The right ventricle has normal systolic function. The cavity was normal.  There is no increase in right ventricular wall thickness.  3. Left atrial size was moderately dilated.  4. The mitral valve is normal in structure. There is mild calcification. No evidence of mitral valve stenosis. Mild mitral regurgitation.  5. The tricuspid valve is normal in structure.  6. The aortic valve is tricuspid There is mild calcification of the aortic valve. No aortic stenosis.  7. No evidence of left ventricular regional wall motion abnormalities.  8. The interatrial septum was not well visualized.  9. PA systolic pressure 32 mmHg. 10. The inferior vena cava is normal in size with greater than 50% respiratory variability   Dg Chest 2 View  Result Date: 11/26/2018 CLINICAL DATA:  Left chest pain in the left breast area radiating to the central chest for the past 2 weeks. Chronic productive cough. Ex-smoker. EXAM: CHEST - 2 VIEW COMPARISON:  Chest CT dated 05/23/2018 and chest radiographs dated 01/05/2017. FINDINGS: Normal sized heart. Stable scarring at the left lung base. Stable prominence of the interstitial markings with normal vasculature. Median sternotomy wires are again demonstrated with a broken inferior wire. Mild thoracic spine degenerative changes. Atheromatous aortic calcifications. Coronary artery stents. IMPRESSION: No acute finding. Stable chronic interstitial lung disease and left basilar scarring. Electronically Signed   By: Claudie Revering M.D.   On: 11/26/2018 14:17    Micro Results     No results found  for this or any previous visit (from the past 240 hour(s)).     Today   Subjective    Cindy [name] today states that she has had no recurrence of her chest pain.  During her hospitalization she notes that she did not take any of her medications as they charged too much when given from the hospital. Objective   Blood pressure (!) 146/66, pulse 64, temperature 97.6 F (36.4 C), temperature source Oral, resp. rate 20, height 5' (1.524 m), weight 75.6 kg,  SpO2 97 %.   Intake/Output Summary (Last 24 hours) at 11/28/2018 0939 Last data filed at 11/28/2018 5364 Gross per 24 hour  Intake 761.08 ml  Output 1650 ml  Net -888.92 ml    Exam  Constitutional: Elderly female in NAD, calm, comfortable Eyes: PERRL, lids and conjunctivae normal ENMT: Mucous membranes are moist. Posterior pharynx clear of any exudate or lesions.  Neck: normal, supple, no masses, no thyromegaly Respiratory: clear to auscultation bilaterally, no wheezing, no crackles. Normal respiratory effort. No accessory muscle use.  Cardiovascular: Regular rate and rhythm, no murmurs / rubs / gallops. No extremity edema. 2+ pedal pulses. No carotid bruits.  Abdomen: no tenderness, no masses palpated. No hepatosplenomegaly. Bowel sounds positive.  Musculoskeletal: no clubbing / cyanosis. No joint deformity upper and lower extremities. Good ROM, no contractures. Normal muscle tone.  Skin: no rashes, lesions, ulcers. No induration Neurologic: CN 2-12 grossly intact. Sensation intact, DTR normal. Strength 5/5 in all 4.  Psychiatric: Normal judgment and insight. Alert and oriented x 3. Normal mood.    Data Review   CBC w Diff:  Lab Results  Component Value Date   WBC 6.4 11/28/2018   HGB 11.0 (L) 11/28/2018   HGB 12.9 12/24/2012   HCT 32.6 (L) 11/28/2018   HCT 36.6 12/24/2012   PLT 159 11/28/2018   PLT 180 12/24/2012   LYMPHOPCT 12 01/05/2017   LYMPHOPCT 31.2 12/24/2012   MONOPCT 10 01/05/2017   MONOPCT 9.9 12/24/2012   EOSPCT 3 01/05/2017   EOSPCT 5.9 12/24/2012   BASOPCT 0 01/05/2017   BASOPCT 0.8 12/24/2012    CMP:  Lab Results  Component Value Date   NA 139 11/28/2018   NA 139 12/24/2012   K 4.0 11/28/2018   K 4.4 12/24/2012   CL 103 11/28/2018   CL 102 12/24/2012   CO2 22 11/28/2018   CO2 28 12/24/2012   BUN 18 11/28/2018   BUN 16.5 12/24/2012   CREATININE 1.24 (H) 11/28/2018   CREATININE 1.1 12/24/2012   PROT 6.3 (L) 01/05/2017   PROT 7.2 12/24/2012    ALBUMIN 3.7 01/05/2017   ALBUMIN 3.8 12/24/2012   BILITOT 0.4 01/05/2017   BILITOT 0.33 12/24/2012   ALKPHOS 49 01/05/2017   ALKPHOS 82 12/24/2012   AST 24 01/05/2017   AST 22 12/24/2012   ALT 15 01/05/2017   ALT 15 12/24/2012  .   Total Time in preparing paper work, data evaluation and todays exam - 35 minutes  Norval Morton M.D on 11/28/2018 at 9:39 AM  [facility]   Office  [phone]

## 2018-11-28 NOTE — Progress Notes (Addendum)
Progress Note  Patient Name: Cindy [name] Date of Encounter: 11/28/2018  Primary Cardiologist: No primary care provider on file.   Subjective   Patient feeling well this AM. No recurrence of CP. Aware of left heart cath results. Discussed plan to continue to increase Imdur. She is anxious to get out of the hospital. She will follow-up with Dr. Marlou Porch.   Inpatient Medications    Scheduled Meds: . aspirin  81 mg Oral Daily  . atorvastatin  40 mg Oral QPM  . famotidine  20 mg Oral QHS  . heparin  5,000 Units Subcutaneous Q8H  . spironolactone  25 mg Oral Daily   And  . hydrochlorothiazide  25 mg Oral Daily  . isosorbide mononitrate  60 mg Oral Daily  . levothyroxine  150 mcg Oral Q0600  . losartan  50 mg Oral Daily  . metoprolol succinate  50 mg Oral Daily  . pantoprazole  40 mg Oral BID  . sodium chloride flush  3 mL Intravenous Q12H  . traMADol  50 mg Oral BID   Continuous Infusions: . sodium chloride     PRN Meds: sodium chloride, acetaminophen, antiseptic oral rinse, morphine injection, nitroGLYCERIN, ondansetron (ZOFRAN) IV, sodium chloride flush, zolpidem   Vital Signs    Vitals:   11/27/18 1939 11/27/18 2007 11/28/18 0017 11/28/18 0425  BP: (!) 147/50  (!) 154/55 (!) 146/66  Pulse: 83  80 64  Resp: 17   20  Temp:  98.5 F (36.9 C)  97.6 F (36.4 C)  TempSrc:  Oral  Oral  SpO2: 96%   97%  Weight:   75.6 kg   Height:        Intake/Output Summary (Last 24 hours) at 11/28/2018 1001 Last data filed at 11/28/2018 0828 Gross per 24 hour  Intake 761.08 ml  Output 1650 ml  Net -888.92 ml   Filed Weights   11/27/18 0542 11/27/18 1200 11/28/18 0017  Weight: 52.1 kg 73.9 kg 75.6 kg   Telemetry    Sinus rhythm with LBBB - Personally Reviewed  ECG    No new EKG for review.  Physical Exam   Today's Vitals   11/28/18 0017 11/28/18 0207 11/28/18 0425 11/28/18 0812  BP: (!) 154/55  (!) 146/66   Pulse: 80  64   Resp:   20   Temp:   97.6 F (36.4 C)     TempSrc:   Oral   SpO2:   97%   Weight: 75.6 kg     Height:      PainSc:  Asleep  0-No pain   Body mass index is 32.54 kg/m.   General: Elderly female in no acute distress HENT: Normocephalic, atraumatic, moist mucus membranes Pulm: Good air movement with no wheezing or crackles  CV: RRR, no murmurs, no rubs  Abdomen: Active bowel sounds, soft, non-distended, no tenderness to palpation  Extremities: Pulses palpable in all extremities, no LE edema  Skin: Warm and dry  Neuro: Alert and oriented x 3  Labs    Chemistry Recent Labs  Lab 11/26/18 1353 11/26/18 1707 11/27/18 1249 11/28/18 0403  NA 140  --  142 139  K 4.5  --  4.5 4.0  CL 106  --  105 103  CO2 23  --  21* 22  GLUCOSE 110*  --  114* 97  BUN 29*  --  21 18  CREATININE 1.44* 1.41* 1.25* 1.24*  CALCIUM 10.2  --  10.3 9.5  GFRNONAA 33* 34*  39* 40*  GFRAA 39* 40* 46* 46*  ANIONGAP 11  --  16* 14     Hematology Recent Labs  Lab 11/26/18 1353 11/26/18 1707 11/28/18 0403  WBC 6.4 6.0 6.4  RBC 3.66* 3.78* 3.49*  HGB 11.4* 11.7* 11.0*  HCT 35.1* 35.9* 32.6*  MCV 95.9 95.0 93.4  MCH 31.1 31.0 31.5  MCHC 32.5 32.6 33.7  RDW 12.2 12.2 12.2  PLT 178 167 159   Cardiac Enzymes Recent Labs  Lab 11/26/18 1707 11/26/18 1844 11/26/18 2137  TROPONINI <0.03 <0.03 <0.03    Recent Labs  Lab 11/26/18 1354  TROPIPOC 0.00    BNPNo results for input(s): BNP, PROBNP in the last 168 hours.   DDimer No results for input(s): DDIMER in the last 168 hours.   Radiology    Dg Chest 2 View  Result Date: 11/26/2018 CLINICAL DATA:  Left chest pain in the left breast area radiating to the central chest for the past 2 weeks. Chronic productive cough. Ex-smoker. EXAM: CHEST - 2 VIEW COMPARISON:  Chest CT dated 05/23/2018 and chest radiographs dated 01/05/2017. FINDINGS: Normal sized heart. Stable scarring at the left lung base. Stable prominence of the interstitial markings with normal vasculature. Median sternotomy wires  are again demonstrated with a broken inferior wire. Mild thoracic spine degenerative changes. Atheromatous aortic calcifications. Coronary artery stents. IMPRESSION: No acute finding. Stable chronic interstitial lung disease and left basilar scarring. Electronically Signed   By: Claudie Revering M.D.   On: 11/26/2018 14:17   Cardiac Studies   Left Heart Cath   Dist RCA lesion is 100% stenosed.  Ost RPDA to RPDA lesion is 100% stenosed.  Post Atrio lesion is 100% stenosed.  Prox RCA to Mid RCA lesion is 100% stenosed.  Origin to Prox Graft lesion is 100% stenosed.  Mid Graft to Insertion lesion is 100% stenosed.  The left ventricular systolic function is normal.  LV end diastolic pressure is normal.  The left ventricular ejection fraction is 55-65% by visual estimate.  Patient Profile     Cindy [name] is a 85 y.o. female with a hx of CAD s/p 1 vessel CABG and 4 DES placement who is being seen today for the evaluation of atypical chest pain.   Assessment & Plan    Atypical Chest pain  CAD s/p 1 vessel CABG and 3 DES placement - No further chest pain since admission  - Left heart cath yesterday with no lesions amendable to intervention  - Continue ASA 81 mg QD, Atorvastatin 40 mg QD, Metoprolol succinate 50 mg QD, Losartan 50 mg QD, and Spironolactone 25 mg QD - Increase Imdur to 90 mg QD - If BP unable to tolerate further increase can add ranolazine   Hypertension  - Goal <130/80  - Continue  Metoprolol succinate 50 mg QD, Losartan 50 mg QD, and Spironolactone 25 mg QD - Increase Imdur to 90 mg QD  For questions or updates, please contact [facility] Please consult [email or web address] for contact info under Cardiology/STEMI.   Signed, Ina Homes, MD  11/28/2018, 10:01 AM   ---------------------------------------------------------------------------------------------   History and all data above reviewed.  Patient examined.  I agree with the findings as above.  Cindy [name] is feeling well today and has no specific complaints.   Constitutional: No acute distress Eyes: pupils equally round and reactive to light, sclera non-icteric, normal conjunctiva and lids ENMT: normal dentition, moist mucous membranes Cardiovascular: regular rhythm, normal rate, no murmurs. S1 and S2  normal. Radial pulses normal bilaterally. No jugular venous distention. Groin site c/d/i Respiratory: clear to auscultation bilaterally GI : normal bowel sounds, soft and nontender. No distention.   MSK: extremities warm, well perfused. No edema.  NEURO: grossly nonfocal exam, moves all extremities. PSYCH: alert and oriented x 3, normal mood and affect.   All available labs, radiology testing, previous records reviewed. Agree with documented assessment and plan of my colleague as stated above with the following additions or changes:  Principal Problem:   Angina decubitus ([place]) Active Problems:   Renal insufficiency   ILD (interstitial lung disease) ([place])   Coronary atherosclerosis of native coronary artery   Old MI (myocardial infarction)   Hyperlipidemia   Hypothyroidism   Unstable angina ([place])    Plan: Will plan to uptitrate imdur to 90 mg daily for antianginal effect since she has been taking more sublingual nitro recently. Discussed f/u and natural history of vein grafts with regard to her recent cath results. Answered all questions to the best of my ability.  F/u arranged 1-2 weeks in CV clinic.   Time Spent Directly with Patient:  I have spent a total of 35 minutes with the patient reviewing hospital notes, telemetry, EKGs, labs and examining the patient as well as establishing an assessment and plan that was discussed personally with the patient.  > 50% of time was spent in direct patient care.  Length of Stay:  LOS: 0 days   Elouise Munroe, MD [facility] 11:37 AM  11/28/2018

## 2018-11-28 NOTE — Progress Notes (Signed)
Patient refused to take all morning medications. Stated wanted to take at home when she gets discharged. Will continue to monitor.

## 2018-12-01 NOTE — Telephone Encounter (Signed)
Pt was evaluated and treated in ED recently.  She has been scheduled for f/u 2/27 with Cecilie Kicks, NP.

## 2018-12-02 DIAGNOSIS — Z09 Encounter for follow-up examination after completed treatment for conditions other than malignant neoplasm: Secondary | ICD-10-CM | POA: Diagnosis not present

## 2018-12-02 DIAGNOSIS — I209 Angina pectoris, unspecified: Secondary | ICD-10-CM | POA: Diagnosis not present

## 2018-12-02 DIAGNOSIS — Z79899 Other long term (current) drug therapy: Secondary | ICD-10-CM | POA: Diagnosis not present

## 2018-12-04 NOTE — Telephone Encounter (Signed)
Thank you for the update Candee Furbish, MD

## 2018-12-10 ENCOUNTER — Ambulatory Visit: Payer: Medicare Other

## 2018-12-15 DIAGNOSIS — R05 Cough: Secondary | ICD-10-CM | POA: Diagnosis not present

## 2018-12-15 DIAGNOSIS — M109 Gout, unspecified: Secondary | ICD-10-CM | POA: Diagnosis not present

## 2018-12-15 DIAGNOSIS — J019 Acute sinusitis, unspecified: Secondary | ICD-10-CM | POA: Diagnosis not present

## 2018-12-18 ENCOUNTER — Ambulatory Visit: Payer: Medicare Other | Admitting: Cardiology

## 2018-12-20 ENCOUNTER — Other Ambulatory Visit: Payer: Self-pay | Admitting: Pulmonary Disease

## 2018-12-25 ENCOUNTER — Encounter: Payer: Self-pay | Admitting: Cardiology

## 2018-12-25 ENCOUNTER — Ambulatory Visit (INDEPENDENT_AMBULATORY_CARE_PROVIDER_SITE_OTHER): Payer: Medicare Other | Admitting: Cardiology

## 2018-12-25 VITALS — BP 124/70 | HR 65 | Ht 60.0 in | Wt 169.1 lb

## 2018-12-25 DIAGNOSIS — I10 Essential (primary) hypertension: Secondary | ICD-10-CM | POA: Insufficient documentation

## 2018-12-25 DIAGNOSIS — I25708 Atherosclerosis of coronary artery bypass graft(s), unspecified, with other forms of angina pectoris: Secondary | ICD-10-CM | POA: Diagnosis not present

## 2018-12-25 DIAGNOSIS — I739 Peripheral vascular disease, unspecified: Secondary | ICD-10-CM

## 2018-12-25 DIAGNOSIS — E785 Hyperlipidemia, unspecified: Secondary | ICD-10-CM | POA: Diagnosis not present

## 2018-12-25 DIAGNOSIS — I6523 Occlusion and stenosis of bilateral carotid arteries: Secondary | ICD-10-CM

## 2018-12-25 DIAGNOSIS — I779 Disorder of arteries and arterioles, unspecified: Secondary | ICD-10-CM | POA: Insufficient documentation

## 2018-12-25 MED ORDER — ATORVASTATIN CALCIUM 80 MG PO TABS: 80.0000 mg | ORAL_TABLET | Freq: Every day | ORAL | 3 refills | Status: AC

## 2018-12-25 NOTE — Progress Notes (Signed)
Cardiology Office Note:    Date:  12/25/2018   ID:  Cindy [name], DOB [birth year], MRN [id number]  PCP:  Lawerance Cruel, MD  Cardiologist:  Candee Furbish, MD  Referring MD: Lawerance Cruel, MD   Chief Complaint  Patient presents with  . Hospitalization Follow-up    Chest pain    History of Present Illness:    Cindy [name] is a 85 y.o. female with a past medical history significant for CAD s/p MI at age 78, HTN, HLD, non-specific interstitial lung disease, chronic LBBB. She had 1V CABG in 1980 and previous stents in 2005 and 2009.   She presented to the hospital on 11/26/2018 with complaints of atypical chest pain. ACS ruled out but patient with high risk CAD. Although stress was negative in 2017 she was taken for cardiac cath which showed which showed an occluded dominant RCA, occluded RCA vein graft with left-to-right collaterals and normal LV function.  Medical therapy will be recommended. Continues on ASA 81 mg QD, Atorvastatin 40 mg QD, Metoprolol succinate 50 mg QD, Losartan 50 mg QD, and Spironolactone 25 mg QD. Imdur was increased to 90 mg daily.   Today she is here for hospital follow up. She says that she had been having pains that started in the left breast area and moved to center of chest. It relieved with NTG. This had been going on for about a month. Since the hospital her chest pain is improved. She has taken 2 NTG. She occ takes NTG for heart burn which helps. She has never had shortness of breath with her discomfort. No lightheadedness. After the hospitalization she has a bad sinus unfection and has just finished a round of amoxicillin. She is much better today. She feels still a little weak and shaky but has been doing more activity than she has in the last 2 months per her husband. She is doing housework and Medical sales representative.   Pt has carotid bruits bilateral. No stroke like symptoms. She had <50% RICA stenosis and 98-33% LICA stenosis by [place] in 07/2017. Pt does not really  want to have it rechecked. Her husband recently had a stroke and required carotid endarterectomy and he would really like pt to have follow up carotid US.    Past Medical History:  Diagnosis Date  . Asthma   . Bronchitis   . COPD (chronic obstructive pulmonary disease) ([place])   . Coronary artery disease    MI age 59  . GERD (gastroesophageal reflux disease)   . Heart attack ([place])   . Hypercholesteremia   . Hypertension   . Iron deficiency anemia    used to see hem in [place], [place].   [name] Renal insufficiency   . Thyroid disease     Past Surgical History:  Procedure Laterality Date  . APPENDECTOMY    . BREAST BIOPSY Right    benign  . CORONARY ANGIOPLASTY WITH STENT PLACEMENT    . CORONARY ARTERY BYPASS GRAFT  09/1979  . LEFT HEART CATH AND CORS/GRAFTS ANGIOGRAPHY N/A 11/27/2018   Procedure: LEFT HEART CATH AND CORS/GRAFTS ANGIOGRAPHY;  Surgeon: Lorretta Harp, MD;  Location: [facility];  Service: Cardiovascular;  Laterality: N/A;  . Moh's Surgeries     for squamous cell carcinoma  . PARTIAL HYSTERECTOMY  10/1978    Current Medications: Current Meds  Medication Sig  . aspirin 81 MG tablet Take 81 mg by mouth daily.   . benzonatate (TESSALON) 200 MG capsule TAKE 1  CAPSULE BY MOUTH THREE TIMES DAILY AS NEEDED FOR COUGH  . Calcium Carbonate-Vitamin D (CALCIUM 600+D) 600-200 MG-UNIT TABS Take 1 tablet by mouth 2 (two) times daily.  . famotidine (PEPCID) 20 MG tablet Take 20 mg by mouth at bedtime as needed. Acid reflux  . ferrous sulfate 325 (65 FE) MG tablet Take 325 mg by mouth daily with breakfast.  . fexofenadine (ALLEGRA) 180 MG tablet Take 180 mg by mouth daily.  . GuaiFENesin (MUCINEX PO) Take 400 mg by mouth daily.  [name] ipratropium (ATROVENT) 0.03 % nasal spray Place 1 spray into both nostrils every 12 (twelve) hours. (Patient taking differently: Place 1 spray into both nostrils 2 (two) times daily as needed for rhinitis. )  . isosorbide mononitrate (IMDUR) 60 MG 24 hr  tablet Take 1.5 tablets (90 mg total) by mouth daily.  [name] levothyroxine (SYNTHROID, LEVOTHROID) 175 MCG tablet Take 175 mcg by mouth daily before breakfast.  . losartan (COZAAR) 50 MG tablet Take 50 mg by mouth daily.  . metoprolol (TOPROL-XL) 50 MG 24 hr tablet Take 50 mg by mouth daily.    . Multiple Vitamins-Minerals (PRESERVISION AREDS 2 PO) Take 1 capsule by mouth 2 (two) times daily.  . nitroGLYCERIN (NITROSTAT) 0.4 MG SL tablet Place 1 tablet (0.4 mg total) under the tongue every 5 (five) minutes as needed for chest pain.  . pantoprazole (PROTONIX) 40 MG tablet TAKE 1 TABLET BY MOUTH TWICE DAILY (Patient taking differently: Take 40 mg by mouth 2 (two) times daily. )  . Polyethyl Glycol-Propyl Glycol (SYSTANE OP) Apply 1-2 drops to eye at bedtime.  [name] spironolactone-hydrochlorothiazide (ALDACTAZIDE) 25-25 MG per tablet Take 1 tablet by mouth daily.    . traMADol (ULTRAM) 50 MG tablet Take 50 mg by mouth 2 (two) times daily. For pain. Maximum dose= 8 tablets per day  . [DISCONTINUED] atorvastatin (LIPITOR) 40 MG tablet Take 40 mg by mouth every evening.      Allergies:   Codeine   Social History   Socioeconomic History  . Marital status: Married    Spouse name: Not on file  . Number of children: 2  . Years of education: Not on file  . Highest education level: Not on file  Occupational History  . Occupation: retired    Comment: retired  Scientific laboratory technician  . Financial resource strain: Not on file  . Food insecurity:    Worry: Not on file    Inability: Not on file  . Transportation needs:    Medical: Not on file    Non-medical: Not on file  Tobacco Use  . Smoking status: Former Smoker    Packs/day: 1.50    Years: 22.00    Pack years: 33.00    Types: Cigarettes    Last attempt to quit: 10/23/1979    Years since quitting: 39.2  . Smokeless tobacco: Never Used  Substance and Sexual Activity  . Alcohol use: Yes    Comment: occasional  . Drug use: No  . Sexual activity: Not on file   Lifestyle  . Physical activity:    Days per week: Not on file    Minutes per session: Not on file  . Stress: Not on file  Relationships  . Social connections:    Talks on phone: Not on file    Gets together: Not on file    Attends religious service: Not on file    Active member of club or organization: Not on file    Attends meetings of clubs  or organizations: Not on file    Relationship status: Not on file  Other Topics Concern  . Not on file  Social History Narrative  . Not on file     Family History: The patient's family history includes Heart disease in her father and mother; Melanoma in her sister. ROS:   Please see the history of present illness.     All other systems reviewed and are negative.  EKGs/Labs/Other Studies Reviewed:    The following studies were reviewed today:  LEFT HEART CATH AND CORS/GRAFTS ANGIOGRAPHY 11/27/2018  Conclusion    Dist RCA lesion is 100% stenosed.  Ost RPDA to RPDA lesion is 100% stenosed.  Post Atrio lesion is 100% stenosed.  Prox RCA to Mid RCA lesion is 100% stenosed.  Origin to Prox Graft lesion is 100% stenosed.  Mid Graft to Insertion lesion is 100% stenosed.  The left ventricular systolic function is normal.  LV end diastolic pressure is normal.  The left ventricular ejection fraction is 55-65% by visual estimate.  IMPRESSION: Ms. Freeze has an occluded dominant RCA, occluded RCA vein graft with left-to-right collaterals and normal LV function.  Medical therapy will be recommended.  A minx closure device was successfully deployed.  The patient left lab in stable condition.  She will be gently hydrated and most likely discharge home tomorrow on antianginal medications.  Quay Burow. MD, [facility]   Carotid artery [place] 08/13/2017 IMPRESSION: Atherosclerotic disease in the carotid arteries, left side greater than right. Large amount of plaque at the left carotid bulb involving the proximal left internal and external  carotid arteries.  Peak systolic velocity in the proximal left internal carotid artery is mildly elevated but this area is difficult to evaluate due to shadowing plaque. Estimated degree of stenosis in the left internal carotid artery is 50-69% and stable from the previous examination.  Stenosis at the origin of the left external carotid artery.  Estimated degree of stenosis in the right internal carotid artery is less than 50%.  Patent vertebral arteries with antegrade flow.  Asymmetric blood pressures, left side is lower than right. Findings raise concern for left subclavian artery stenosis.  EKG:  EKG is not ordered today.    Recent Labs: 11/28/2018: BUN 18; Creatinine, Ser 1.24; Hemoglobin 11.0; Platelets 159; Potassium 4.0; Sodium 139   Recent Lipid Panel No results found for: CHOL, TRIG, HDL, CHOLHDL, VLDL, LDLCALC, LDLDIRECT  Physical Exam:    VS:  BP 124/70   Pulse 65   Ht 5' (1.524 m)   Wt 169 lb 1.9 oz (76.7 kg)   LMP  (LMP Unknown)   SpO2 93%   BMI 33.03 kg/m     Wt Readings from Last 3 Encounters:  12/25/18 169 lb 1.9 oz (76.7 kg)  11/28/18 166 lb 9.6 oz (75.6 kg)  05/23/18 172 lb 9.6 oz (78.3 kg)     Physical Exam  Constitutional: She is oriented to person, place, and time. She appears well-developed and well-nourished. No distress.  HENT:  Head: Normocephalic and atraumatic.  Neck: Normal range of motion. Neck supple. No JVD present.  Bilateral carotid bruits  Cardiovascular: Normal rate and regular rhythm.  Soft systolic murmur at LUSB  Pulmonary/Chest: Effort normal and breath sounds normal. No respiratory distress. She has no wheezes. She has no rales.  Abdominal: Soft. Bowel sounds are normal.  Musculoskeletal: Normal range of motion.     Comments: Trace ankle edema, L>R (left leg had vein harvest)  Neurological: She is alert and  oriented to person, place, and time.  Skin: Skin is warm and dry.  Psychiatric: She has a normal mood and  affect. Her behavior is normal. Judgment and thought content normal.  Vitals reviewed.    ASSESSMENT:    1. Coronary artery disease of bypass graft of native heart with stable angina pectoris ([place])   2. Essential (primary) hypertension   3. Bilateral carotid artery stenosis   4. Hyperlipidemia, unspecified hyperlipidemia type    PLAN:    In order of problems listed above:  CAD with stable angina -Hx 1V CABG 1981 and multiple stents since then.  ACS ruled out but patient with high risk CAD. Although stress was negative in 2017 she was taken for [facility] which showed an occluded dominant RCA, occluded RCA vein graft with left-to-right collaterals and normal LV function.  Medical therapy will be recommended.  - Continue ASA 81 mg QD, Atorvastatin 40 mg QD, Metoprolol succinate 50 mg QD, Losartan 50 mg QD, and Spironolactone 25 mg QD. Imdur was increased to 90 mg daily.  -She is tolerating the increase dose of Imdur and feels like her chest pain is well controlled. Had 2 episodes that felt like heart burn when she laid down, easily resolved with NTG.  -Continue current therapy.   Hypertension  - Goal <130/80  - Continue  Metoprolol succinate 50 mg QD, Losartan 50 mg QD, Imdur 60 mg QD, and Spironolactone 25 mg QD -BP is well controlled.   Carotid artery stenosis -carotid bruits bilateral. No stroke like symptoms. She had <50% RICA stenosis and 29-79% LICA stenosis by [place] in 07/2017. Pt does not really want to have it rechecked. Her husband recently had a stroke and required carotid endarterectomy and he would really like pt to have follow up carotid US.  -I will order carotid dopplers and pt can decide if she wants to have the test.   HLD -On atorvastatin 40 mg daily. -LDL by [facility] was 77 08/29/2018. Goal LDL of <70 with known CAD and carotid artery disease. Will increase lipitor to 80 mg daily.  -Repeat lipids and LFTs in 6 weeks.   Medication Adjustments/Labs and Tests Ordered: Current  medicines are reviewed at length with the patient today.  Concerns regarding medicines are outlined above. Labs and tests ordered and medication changes are outlined in the patient instructions below:  Patient Instructions  Medication Instructions:  INCREASE: Atorvastatin (Lipitor) to 80 mg daily   If you need a refill on your cardiac medications before your next appointment, please call your pharmacy.   Lab work: Your physician recommends that you return for a FASTING lipid profile and hepatic function test in 6 weeks  If you have labs (blood work) drawn today and your tests are completely normal, you will receive your results only by: [name] MyChart Message (if you have MyChart) OR . A paper copy in the mail If you have any lab test that is abnormal or we need to change your treatment, we will call you to review the results.  Testing/Procedures: Your physician has requested that you have a carotid duplex. This test is an ultrasound of the carotid arteries in your neck. It looks at blood flow through these arteries that supply the brain with blood. Allow one hour for this exam. There are no restrictions or special instructions.   Follow-Up: At [facility], you and your health needs are our priority.  As part of our continuing mission to provide you with exceptional heart care, we have  created designated Provider Care Teams.  These Care Teams include your primary Cardiologist (physician) and Advanced Practice Providers (APPs -  Physician Assistants and Nurse Practitioners) who all work together to provide you with the care you need, when you need it. You will need a follow up appointment in 6 months.  Please call our office 2 months in advance to schedule this appointment.  You may see Candee Furbish, MD or one of the following Advanced Practice Providers on your designated Care Team:   Truitt Merle, NP Cecilie Kicks, NP . Kathyrn Drown, NP  Any Other Special Instructions Will Be Listed Below  (If Applicable).      Signed, Daune Perch, NP  12/25/2018 5:10 PM    [facility]

## 2018-12-25 NOTE — Patient Instructions (Addendum)
Medication Instructions:  INCREASE: Atorvastatin (Lipitor) to 80 mg daily   If you need a refill on your cardiac medications before your next appointment, please call your pharmacy.   Lab work: Your physician recommends that you return for a FASTING lipid profile and hepatic function test in 6 weeks  If you have labs (blood work) drawn today and your tests are completely normal, you will receive your results only by: [name] MyChart Message (if you have MyChart) OR . A paper copy in the mail If you have any lab test that is abnormal or we need to change your treatment, we will call you to review the results.  Testing/Procedures: Your physician has requested that you have a carotid duplex. This test is an ultrasound of the carotid arteries in your neck. It looks at blood flow through these arteries that supply the brain with blood. Allow one hour for this exam. There are no restrictions or special instructions.   Follow-Up: At [facility], you and your health needs are our priority.  As part of our continuing mission to provide you with exceptional heart care, we have created designated Provider Care Teams.  These Care Teams include your primary Cardiologist (physician) and Advanced Practice Providers (APPs -  Physician Assistants and Nurse Practitioners) who all work together to provide you with the care you need, when you need it. You will need a follow up appointment in 6 months.  Please call our office 2 months in advance to schedule this appointment.  You may see Candee Furbish, MD or one of the following Advanced Practice Providers on your designated Care Team:   Truitt Merle, NP Cecilie Kicks, NP . Kathyrn Drown, NP  Any Other Special Instructions Will Be Listed Below (If Applicable).

## 2019-01-07 ENCOUNTER — Encounter (HOSPITAL_COMMUNITY): Payer: Medicare Other

## 2019-01-13 ENCOUNTER — Encounter (HOSPITAL_COMMUNITY): Payer: Medicare Other

## 2019-01-22 ENCOUNTER — Ambulatory Visit: Payer: Medicare Other

## 2019-01-23 ENCOUNTER — Ambulatory Visit (HOSPITAL_COMMUNITY)
Admission: RE | Admit: 2019-01-23 | Discharge: 2019-01-23 | Disposition: A | Discharge status: Home / Self Care | Payer: Medicare Other | Source: Ambulatory Visit | Attending: Cardiology | Admitting: Cardiology

## 2019-01-23 ENCOUNTER — Other Ambulatory Visit: Payer: Self-pay

## 2019-01-23 ENCOUNTER — Ambulatory Visit: Payer: Medicare Other

## 2019-01-23 DIAGNOSIS — I6523 Occlusion and stenosis of bilateral carotid arteries: Secondary | ICD-10-CM

## 2019-01-28 DIAGNOSIS — I2510 Atherosclerotic heart disease of native coronary artery without angina pectoris: Secondary | ICD-10-CM | POA: Diagnosis not present

## 2019-01-28 DIAGNOSIS — I209 Angina pectoris, unspecified: Secondary | ICD-10-CM | POA: Diagnosis not present

## 2019-01-28 DIAGNOSIS — I10 Essential (primary) hypertension: Secondary | ICD-10-CM | POA: Diagnosis not present

## 2019-01-28 DIAGNOSIS — N183 Chronic kidney disease, stage 3 (moderate): Secondary | ICD-10-CM | POA: Diagnosis not present

## 2019-01-28 DIAGNOSIS — E039 Hypothyroidism, unspecified: Secondary | ICD-10-CM | POA: Diagnosis not present

## 2019-01-28 DIAGNOSIS — D638 Anemia in other chronic diseases classified elsewhere: Secondary | ICD-10-CM | POA: Diagnosis not present

## 2019-01-28 DIAGNOSIS — M1990 Unspecified osteoarthritis, unspecified site: Secondary | ICD-10-CM | POA: Diagnosis not present

## 2019-01-28 DIAGNOSIS — E785 Hyperlipidemia, unspecified: Secondary | ICD-10-CM | POA: Diagnosis not present

## 2019-02-06 ENCOUNTER — Other Ambulatory Visit: Payer: Medicare Other

## 2019-02-10 DIAGNOSIS — L570 Actinic keratosis: Secondary | ICD-10-CM | POA: Diagnosis not present

## 2019-02-10 DIAGNOSIS — D0439 Carcinoma in situ of skin of other parts of face: Secondary | ICD-10-CM | POA: Diagnosis not present

## 2019-02-10 DIAGNOSIS — X32XXXD Exposure to sunlight, subsequent encounter: Secondary | ICD-10-CM | POA: Diagnosis not present

## 2019-03-02 ENCOUNTER — Other Ambulatory Visit: Payer: Self-pay | Admitting: Pulmonary Disease

## 2019-03-13 ENCOUNTER — Ambulatory Visit: Payer: Medicare Other

## 2019-03-17 DIAGNOSIS — G894 Chronic pain syndrome: Secondary | ICD-10-CM | POA: Diagnosis not present

## 2019-03-17 DIAGNOSIS — R6889 Other general symptoms and signs: Secondary | ICD-10-CM | POA: Diagnosis not present

## 2019-03-17 DIAGNOSIS — M542 Cervicalgia: Secondary | ICD-10-CM | POA: Diagnosis not present

## 2019-03-17 DIAGNOSIS — R05 Cough: Secondary | ICD-10-CM | POA: Diagnosis not present

## 2019-03-17 DIAGNOSIS — L570 Actinic keratosis: Secondary | ICD-10-CM | POA: Diagnosis not present

## 2019-03-17 DIAGNOSIS — M1990 Unspecified osteoarthritis, unspecified site: Secondary | ICD-10-CM | POA: Diagnosis not present

## 2019-03-18 ENCOUNTER — Ambulatory Visit
Admission: RE | Admit: 2019-03-18 | Discharge: 2019-03-18 | Disposition: A | Discharge status: Home / Self Care | Payer: Medicare Other | Source: Ambulatory Visit | Attending: Family Medicine | Admitting: Family Medicine

## 2019-03-18 ENCOUNTER — Other Ambulatory Visit: Payer: Self-pay

## 2019-03-18 DIAGNOSIS — Z1231 Encounter for screening mammogram for malignant neoplasm of breast: Secondary | ICD-10-CM

## 2019-03-18 DIAGNOSIS — N183 Chronic kidney disease, stage 3 (moderate): Secondary | ICD-10-CM | POA: Diagnosis not present

## 2019-03-18 DIAGNOSIS — I209 Angina pectoris, unspecified: Secondary | ICD-10-CM | POA: Diagnosis not present

## 2019-03-18 DIAGNOSIS — D638 Anemia in other chronic diseases classified elsewhere: Secondary | ICD-10-CM | POA: Diagnosis not present

## 2019-03-18 DIAGNOSIS — M1990 Unspecified osteoarthritis, unspecified site: Secondary | ICD-10-CM | POA: Diagnosis not present

## 2019-03-18 DIAGNOSIS — E039 Hypothyroidism, unspecified: Secondary | ICD-10-CM | POA: Diagnosis not present

## 2019-03-18 DIAGNOSIS — I10 Essential (primary) hypertension: Secondary | ICD-10-CM | POA: Diagnosis not present

## 2019-03-18 DIAGNOSIS — I2510 Atherosclerotic heart disease of native coronary artery without angina pectoris: Secondary | ICD-10-CM | POA: Diagnosis not present

## 2019-03-18 DIAGNOSIS — E785 Hyperlipidemia, unspecified: Secondary | ICD-10-CM | POA: Diagnosis not present

## 2019-03-24 DIAGNOSIS — D0439 Carcinoma in situ of skin of other parts of face: Secondary | ICD-10-CM | POA: Diagnosis not present

## 2019-03-24 DIAGNOSIS — I209 Angina pectoris, unspecified: Secondary | ICD-10-CM | POA: Diagnosis not present

## 2019-03-24 DIAGNOSIS — E039 Hypothyroidism, unspecified: Secondary | ICD-10-CM | POA: Diagnosis not present

## 2019-03-24 DIAGNOSIS — I2510 Atherosclerotic heart disease of native coronary artery without angina pectoris: Secondary | ICD-10-CM | POA: Diagnosis not present

## 2019-03-24 DIAGNOSIS — M1990 Unspecified osteoarthritis, unspecified site: Secondary | ICD-10-CM | POA: Diagnosis not present

## 2019-03-24 DIAGNOSIS — X32XXXD Exposure to sunlight, subsequent encounter: Secondary | ICD-10-CM | POA: Diagnosis not present

## 2019-03-24 DIAGNOSIS — E785 Hyperlipidemia, unspecified: Secondary | ICD-10-CM | POA: Diagnosis not present

## 2019-03-24 DIAGNOSIS — N183 Chronic kidney disease, stage 3 (moderate): Secondary | ICD-10-CM | POA: Diagnosis not present

## 2019-03-24 DIAGNOSIS — L570 Actinic keratosis: Secondary | ICD-10-CM | POA: Diagnosis not present

## 2019-03-24 DIAGNOSIS — D638 Anemia in other chronic diseases classified elsewhere: Secondary | ICD-10-CM | POA: Diagnosis not present

## 2019-03-24 DIAGNOSIS — D044 Carcinoma in situ of skin of scalp and neck: Secondary | ICD-10-CM | POA: Diagnosis not present

## 2019-03-24 DIAGNOSIS — I10 Essential (primary) hypertension: Secondary | ICD-10-CM | POA: Diagnosis not present

## 2019-03-26 ENCOUNTER — Other Ambulatory Visit: Payer: Self-pay | Admitting: Pulmonary Disease

## 2019-03-26 NOTE — Telephone Encounter (Signed)
Is this appropriate for refill ? 

## 2019-04-13 ENCOUNTER — Other Ambulatory Visit: Payer: Self-pay | Admitting: Physical Medicine and Rehabilitation

## 2019-04-13 ENCOUNTER — Telehealth: Payer: Self-pay | Admitting: *Deleted

## 2019-04-13 DIAGNOSIS — M542 Cervicalgia: Secondary | ICD-10-CM

## 2019-04-13 NOTE — Telephone Encounter (Signed)
    COVID-19 Pre-Screening Questions:  . In the past 7 to 10 days have you had a cough,  shortness of breath, headache, congestion, fever (100 or greater) body aches, chills, sore throat, or sudden loss of taste or sense of smell? . Have you been around anyone with known Covid 19. . Have you been around anyone who is awaiting Covid 19 test results in the past 7 to 10 days? . Have you been around anyone who has been exposed to Covid 19, or has mentioned symptoms of Covid 19 within the past 7 to 10 days?  If you have any concerns/questions about symptoms patients report during screening (either on the phone or at threshold). Contact the provider seeing the patient or DOD for further guidance.  If neither are available contact a member of the leadership team.            Contacted Pt via phone call . Answered all Covid 19 questions no. Has a mask.KB

## 2019-04-14 ENCOUNTER — Other Ambulatory Visit: Payer: Self-pay

## 2019-04-14 ENCOUNTER — Other Ambulatory Visit: Payer: Medicare Other

## 2019-04-14 DIAGNOSIS — E785 Hyperlipidemia, unspecified: Secondary | ICD-10-CM

## 2019-04-14 LAB — LIPID PANEL
Chol/HDL Ratio: 3.8 ratio (ref 0.0–4.4)
Cholesterol, Total: 164 mg/dL (ref 100–199)
HDL: 43 mg/dL (ref >39)
LDL Calculated: 83 mg/dL (ref 0–99)
Triglycerides: 191 mg/dL — ABNORMAL HIGH (ref 0–149)
VLDL Cholesterol Cal: 38 mg/dL (ref 5–40)

## 2019-04-14 LAB — HEPATIC FUNCTION PANEL
ALT: 13 IU/L (ref 0–32)
AST: 23 IU/L (ref 0–40)
Albumin: 4.5 g/dL (ref 3.6–4.6)
Alkaline Phosphatase: 61 IU/L (ref 39–117)
Bilirubin Total: 0.5 mg/dL (ref 0.0–1.2)
Bilirubin, Direct: 0.13 mg/dL (ref 0.00–0.40)
Total Protein: 6.6 g/dL (ref 6.0–8.5)

## 2019-04-15 DIAGNOSIS — M47812 Spondylosis without myelopathy or radiculopathy, cervical region: Secondary | ICD-10-CM | POA: Diagnosis not present

## 2019-04-15 DIAGNOSIS — M40202 Unspecified kyphosis, cervical region: Secondary | ICD-10-CM | POA: Diagnosis not present

## 2019-04-15 DIAGNOSIS — M5021 Other cervical disc displacement,  high cervical region: Secondary | ICD-10-CM | POA: Diagnosis not present

## 2019-04-22 DIAGNOSIS — M5412 Radiculopathy, cervical region: Secondary | ICD-10-CM | POA: Diagnosis not present

## 2019-04-28 DIAGNOSIS — L570 Actinic keratosis: Secondary | ICD-10-CM | POA: Diagnosis not present

## 2019-04-28 DIAGNOSIS — Z08 Encounter for follow-up examination after completed treatment for malignant neoplasm: Secondary | ICD-10-CM | POA: Diagnosis not present

## 2019-04-28 DIAGNOSIS — X32XXXD Exposure to sunlight, subsequent encounter: Secondary | ICD-10-CM | POA: Diagnosis not present

## 2019-04-28 DIAGNOSIS — Z85828 Personal history of other malignant neoplasm of skin: Secondary | ICD-10-CM | POA: Diagnosis not present

## 2019-05-02 ENCOUNTER — Other Ambulatory Visit: Payer: Medicare Other

## 2019-05-06 DIAGNOSIS — M5412 Radiculopathy, cervical region: Secondary | ICD-10-CM | POA: Diagnosis not present

## 2019-05-15 DIAGNOSIS — M47812 Spondylosis without myelopathy or radiculopathy, cervical region: Secondary | ICD-10-CM | POA: Diagnosis not present

## 2019-05-15 DIAGNOSIS — M25512 Pain in left shoulder: Secondary | ICD-10-CM | POA: Diagnosis not present

## 2019-05-24 ENCOUNTER — Other Ambulatory Visit: Payer: Self-pay | Admitting: Pulmonary Disease

## 2019-06-05 ENCOUNTER — Other Ambulatory Visit: Payer: Self-pay | Admitting: Internal Medicine

## 2019-06-07 DIAGNOSIS — I2510 Atherosclerotic heart disease of native coronary artery without angina pectoris: Secondary | ICD-10-CM | POA: Diagnosis not present

## 2019-06-07 DIAGNOSIS — E039 Hypothyroidism, unspecified: Secondary | ICD-10-CM | POA: Diagnosis not present

## 2019-06-07 DIAGNOSIS — D638 Anemia in other chronic diseases classified elsewhere: Secondary | ICD-10-CM | POA: Diagnosis not present

## 2019-06-07 DIAGNOSIS — N183 Chronic kidney disease, stage 3 (moderate): Secondary | ICD-10-CM | POA: Diagnosis not present

## 2019-06-07 DIAGNOSIS — I10 Essential (primary) hypertension: Secondary | ICD-10-CM | POA: Diagnosis not present

## 2019-06-07 DIAGNOSIS — E785 Hyperlipidemia, unspecified: Secondary | ICD-10-CM | POA: Diagnosis not present

## 2019-06-07 DIAGNOSIS — M1990 Unspecified osteoarthritis, unspecified site: Secondary | ICD-10-CM | POA: Diagnosis not present

## 2019-06-07 DIAGNOSIS — I209 Angina pectoris, unspecified: Secondary | ICD-10-CM | POA: Diagnosis not present

## 2019-06-09 ENCOUNTER — Encounter: Payer: Self-pay | Admitting: Adult Health

## 2019-06-09 ENCOUNTER — Other Ambulatory Visit: Payer: Self-pay

## 2019-06-09 ENCOUNTER — Ambulatory Visit (INDEPENDENT_AMBULATORY_CARE_PROVIDER_SITE_OTHER): Payer: Medicare Other | Admitting: Adult Health

## 2019-06-09 DIAGNOSIS — R05 Cough: Secondary | ICD-10-CM

## 2019-06-09 DIAGNOSIS — R059 Cough, unspecified: Secondary | ICD-10-CM

## 2019-06-09 DIAGNOSIS — J849 Interstitial pulmonary disease, unspecified: Secondary | ICD-10-CM

## 2019-06-09 DIAGNOSIS — I6523 Occlusion and stenosis of bilateral carotid arteries: Secondary | ICD-10-CM | POA: Diagnosis not present

## 2019-06-09 MED ORDER — BENZONATATE 200 MG PO CAPS: ORAL_CAPSULE | ORAL | 1 refills | Status: DC

## 2019-06-09 NOTE — Assessment & Plan Note (Addendum)
Appears stable. HRCT chest 05/2018 ILD stable since 2015 .  Encouraged on activity as tolerated.  May use tessalon As needed    Plan  Patient Instructions  Tessalon Three times a day  As needed  Cough.  Activity as tolerated. Get flu shot when available this Fall.  Follow-up with Dr. Elsworth Soho in 1 year and as needed

## 2019-06-09 NOTE — Patient Instructions (Addendum)
Tessalon Three times a day  As needed  Cough.  Activity as tolerated. Get flu shot when available this Fall.  Follow-up with Dr. Elsworth Soho in 1 year and as needed

## 2019-06-09 NOTE — Assessment & Plan Note (Signed)
Chronic cough with underlying ILD .  Control for triggers  Tessalon As needed    Plan  Patient Instructions  Tessalon Three times a day  As needed  Cough.  Activity as tolerated. Get flu shot when available this Fall.  Follow-up with Dr. Elsworth Soho in 1 year and as needed

## 2019-06-09 NOTE — Progress Notes (Signed)
@Patient  ID: Cindy [name], female    DOB: 12-Dec-1931, 85 y.o.   MRN: [id number]  Chief Complaint  Patient presents with  . Follow-up    ILD     Referring provider: Lawerance Cruel, MD  HPI: 85 year old female former smoker followed for interstitial lung disease, chronic cough and stable right lower lobe pulmonary nodule Medical history significant for coronary disease status post CABG in 1981.  TEST/EVENTS :  HRCT 03/2012 ILD pattern is nonspecific, and could represent either early usual interstitial pneumonia (UIP), or nonspecific interstitial pneumonia (NSIP). 16mm RLL nodule   CT chest 07/19/14 -mild subpleural fibroiss unchanged from 03/2012. Scattered pulmonary nodules are unchanged (considered benign with 2 yr serial follow up ) .   PFTs 03/2012 no airway obstruction, preserved lung volumes, isolated decrease in DLCO to 56% corrects for [place].   Spirometry 04/2013 - preserved lung function w/ no airflow obstruction.   06/09/2019 Follow up : ILD , Chronic Cough Patient presents for a one-year follow-up.  Patient has underlying interstitial lung disease.  With chronic cough.  She has been somewhat stable over the last 7 years.  Denies dyspnea . Activity tolerance has been at baseline.  Says she does very light housework.  She says her cough has been some better recently . Uses Tessalon Twice daily  . Feels it is much more controllable.  Patient had a high-resolution CT chest August 2019 that showed fibrotic interstitial lung disease without frank honeycombing and without progression since 2015.  Felt to be most compatible with NSIP.  Allergies  Allergen Reactions  . Codeine Other (See Comments)    Headache from heavy doses of codeine only    Immunization History  Administered Date(s) Administered  . Influenza Split 07/22/2013  . Influenza Whole 09/15/2012  . Influenza,inj,Quad PF,6+ Mos 07/23/2014  . Influenza-Unspecified 08/23/2015  . Pneumococcal Conjugate-13 08/23/2011   . Tdap 09/15/2012    Past Medical History:  Diagnosis Date  . Asthma   . Bronchitis   . COPD (chronic obstructive pulmonary disease) ([place])   . Coronary artery disease    MI age 66  . GERD (gastroesophageal reflux disease)   . Heart attack ([place])   . Hypercholesteremia   . Hypertension   . Iron deficiency anemia    used to see hem in [place], [place].   [name] Renal insufficiency   . Thyroid disease     Tobacco History: Social History   Tobacco Use  Smoking Status Former Smoker  . Packs/day: 1.50  . Years: 22.00  . Pack years: 33.00  . Types: Cigarettes  . Quit date: 10/23/1979  . Years since quitting: 39.6  Smokeless Tobacco Never Used   Counseling given: Not Answered   Outpatient Medications Prior to Visit  Medication Sig Dispense Refill  . aspirin 81 MG tablet Take 81 mg by mouth daily.     [name] atorvastatin (LIPITOR) 80 MG tablet Take 1 tablet (80 mg total) by mouth daily. 90 tablet 3  . Calcium Carbonate-Vitamin D (CALCIUM 600+D) 600-200 MG-UNIT TABS Take 1 tablet by mouth 2 (two) times daily.    . famotidine (PEPCID) 20 MG tablet Take 20 mg by mouth at bedtime as needed. Acid reflux    . ferrous sulfate 325 (65 FE) MG tablet Take 325 mg by mouth daily with breakfast.    . fexofenadine (ALLEGRA) 180 MG tablet Take 180 mg by mouth daily.    [name] gabapentin (NEURONTIN) 300 MG capsule 300 mg daily.     [name]  GuaiFENesin (MUCINEX PO) Take 400 mg by mouth daily.    [name] ipratropium (ATROVENT) 0.03 % nasal spray Place 1 spray into both nostrils every 12 (twelve) hours. (Patient taking differently: Place 1 spray into both nostrils 2 (two) times daily as needed for rhinitis. ) 30 mL 0  . isosorbide mononitrate (IMDUR) 60 MG 24 hr tablet Take 1.5 tablets (90 mg total) by mouth daily. 135 tablet 1  . levothyroxine (SYNTHROID, LEVOTHROID) 175 MCG tablet Take 175 mcg by mouth daily before breakfast.    . losartan (COZAAR) 50 MG tablet Take 50 mg by mouth daily.    . metoprolol (TOPROL-XL) 50 MG 24 hr  tablet Take 50 mg by mouth daily.      . Multiple Vitamins-Minerals (PRESERVISION AREDS 2 PO) Take 1 capsule by mouth 2 (two) times daily.    . nitroGLYCERIN (NITROSTAT) 0.4 MG SL tablet Place 1 tablet (0.4 mg total) under the tongue every 5 (five) minutes as needed for chest pain. 75 tablet 1  . pantoprazole (PROTONIX) 40 MG tablet Take 1 tablet by mouth twice daily 180 tablet 0  . Polyethyl Glycol-Propyl Glycol (SYSTANE OP) Apply 1-2 drops to eye at bedtime.    [name] spironolactone-hydrochlorothiazide (ALDACTAZIDE) 25-25 MG per tablet Take 1 tablet by mouth daily.      . traMADol (ULTRAM) 50 MG tablet Take 50 mg by mouth 2 (two) times daily. For pain. Maximum dose= 8 tablets per day    . benzonatate (TESSALON) 200 MG capsule Take 1 capsule by mouth three times daily as needed for cough 180 capsule 0   No facility-administered medications prior to visit.      Review of Systems:   Constitutional:   No  weight loss, night sweats,  Fevers, chills, fatigue, or  lassitude.  HEENT:   No headaches,  Difficulty swallowing,  Tooth/dental problems, or  Sore throat,                No sneezing, itching, ear ache,  +nasal congestion, post nasal drip,   CV:  No chest pain,  Orthopnea, PND, swelling in lower extremities, anasarca, dizziness, palpitations, syncope.   GI  No heartburn, indigestion, abdominal pain, nausea, vomiting, diarrhea, change in bowel habits, loss of appetite, bloody stools.   Resp:  No chest wall deformity  Skin: no rash or lesions.  GU: no dysuria, change in color of urine, no urgency or frequency.  No flank pain, no hematuria   MS:  No joint pain or swelling.  No decreased range of motion.  No back pain.    Physical Exam  BP 118/74 (BP Location: Right Arm, Patient Position: Sitting, Cuff Size: Normal)   Pulse 64   Temp 98.2 F (36.8 C) (Oral)   Ht 5' (1.524 m)   Wt 168 lb (76.2 kg)   LMP  (LMP Unknown)   SpO2 96%   BMI 32.81 kg/m   GEN: A/Ox3; pleasant , NAD,  elderly , obese    HEENT:  [place]/AT,   NOSE-clear, THROAT-clear, no lesions, no postnasal drip or exudate noted.   NECK:  Supple w/ fair ROM; no JVD; normal carotid impulses w/o bruits; no thyromegaly or nodules palpated; no lymphadenopathy.    RESP  Clear  P & A; w/o, wheezes/ rales/ or rhonchi. no accessory muscle use, no dullness to percussion  CARD:  RRR, no m/r/g, 1 + peripheral edema, pulses intact, no cyanosis or clubbing.  GI:   Soft & nt; nml bowel sounds; no organomegaly or  masses detected.   Musco: Warm bil, no deformities or joint swelling noted.   Neuro: alert, no focal deficits noted.    Skin: Warm, no lesions or rashes    Lab Results:  CBC  BNP No results found for: BNP  ProBNP No results found for: PROBNP  Imaging: No results found.    No flowsheet data found.  No results found for: NITRICOXIDE      Assessment & Plan:   ILD (interstitial lung disease) ([place]) Appears stable. HRCT chest 05/2018 ILD stable since 2015 .  Encouraged on activity as tolerated.  May use tessalon As needed    Plan  Patient Instructions  Tessalon Three times a day  As needed  Cough.  Activity as tolerated. Get flu shot when available this Fall.  Follow-up with Dr. Elsworth Soho in 1 year and as needed     Cough Chronic cough with underlying ILD .  Control for triggers  Tessalon As needed    Plan  Patient Instructions  Tessalon Three times a day  As needed  Cough.  Activity as tolerated. Get flu shot when available this Fall.  Follow-up with Dr. Elsworth Soho in 1 year and as needed        Rexene Edison, NP 06/09/2019

## 2019-06-16 DIAGNOSIS — M5412 Radiculopathy, cervical region: Secondary | ICD-10-CM | POA: Diagnosis not present

## 2019-07-03 DIAGNOSIS — Z23 Encounter for immunization: Secondary | ICD-10-CM | POA: Diagnosis not present

## 2019-08-13 DIAGNOSIS — Z20828 Contact with and (suspected) exposure to other viral communicable diseases: Secondary | ICD-10-CM | POA: Diagnosis not present

## 2019-08-14 ENCOUNTER — Other Ambulatory Visit: Payer: Self-pay

## 2019-08-14 DIAGNOSIS — Z20822 Contact with and (suspected) exposure to covid-19: Secondary | ICD-10-CM

## 2019-08-15 LAB — NOVEL CORONAVIRUS, NAA: SARS-CoV-2, NAA: NOT DETECTED

## 2019-09-08 ENCOUNTER — Other Ambulatory Visit: Payer: Self-pay | Admitting: Pulmonary Disease

## 2019-09-08 DIAGNOSIS — N183 Chronic kidney disease, stage 3 unspecified: Secondary | ICD-10-CM | POA: Diagnosis not present

## 2019-09-08 DIAGNOSIS — I2510 Atherosclerotic heart disease of native coronary artery without angina pectoris: Secondary | ICD-10-CM | POA: Diagnosis not present

## 2019-09-08 DIAGNOSIS — D638 Anemia in other chronic diseases classified elsewhere: Secondary | ICD-10-CM | POA: Diagnosis not present

## 2019-09-08 DIAGNOSIS — I10 Essential (primary) hypertension: Secondary | ICD-10-CM | POA: Diagnosis not present

## 2019-09-08 DIAGNOSIS — M1990 Unspecified osteoarthritis, unspecified site: Secondary | ICD-10-CM | POA: Diagnosis not present

## 2019-09-08 DIAGNOSIS — E785 Hyperlipidemia, unspecified: Secondary | ICD-10-CM | POA: Diagnosis not present

## 2019-09-08 DIAGNOSIS — I209 Angina pectoris, unspecified: Secondary | ICD-10-CM | POA: Diagnosis not present

## 2019-09-08 DIAGNOSIS — E039 Hypothyroidism, unspecified: Secondary | ICD-10-CM | POA: Diagnosis not present

## 2019-09-09 ENCOUNTER — Other Ambulatory Visit: Payer: Self-pay | Admitting: Cardiology

## 2019-09-11 ENCOUNTER — Telehealth: Payer: Self-pay | Admitting: Pulmonary Disease

## 2019-09-11 MED ORDER — PANTOPRAZOLE SODIUM 40 MG PO TBEC: 40.0000 mg | DELAYED_RELEASE_TABLET | Freq: Two times a day (BID) | ORAL | 2 refills | Status: DC

## 2019-09-11 NOTE — Telephone Encounter (Signed)
Reviewed chart, pt is correct and we have in fact been sending Protonix BID.  Unclear why this was reduced.  New rx sent to pharmacy as requested.  Pt aware.  Nothing further needed at this time- will close encounter.

## 2019-11-05 DIAGNOSIS — Z0000 Encounter for general adult medical examination without abnormal findings: Secondary | ICD-10-CM | POA: Diagnosis not present

## 2019-11-05 DIAGNOSIS — K219 Gastro-esophageal reflux disease without esophagitis: Secondary | ICD-10-CM | POA: Diagnosis not present

## 2019-11-05 DIAGNOSIS — E785 Hyperlipidemia, unspecified: Secondary | ICD-10-CM | POA: Diagnosis not present

## 2019-11-05 DIAGNOSIS — I10 Essential (primary) hypertension: Secondary | ICD-10-CM | POA: Diagnosis not present

## 2019-11-05 DIAGNOSIS — I209 Angina pectoris, unspecified: Secondary | ICD-10-CM | POA: Diagnosis not present

## 2019-11-05 DIAGNOSIS — E039 Hypothyroidism, unspecified: Secondary | ICD-10-CM | POA: Diagnosis not present

## 2019-11-05 DIAGNOSIS — G894 Chronic pain syndrome: Secondary | ICD-10-CM | POA: Diagnosis not present

## 2019-11-05 DIAGNOSIS — M549 Dorsalgia, unspecified: Secondary | ICD-10-CM | POA: Diagnosis not present

## 2019-11-05 DIAGNOSIS — N183 Chronic kidney disease, stage 3 unspecified: Secondary | ICD-10-CM | POA: Diagnosis not present

## 2019-11-05 DIAGNOSIS — D638 Anemia in other chronic diseases classified elsewhere: Secondary | ICD-10-CM | POA: Diagnosis not present

## 2019-11-05 DIAGNOSIS — K429 Umbilical hernia without obstruction or gangrene: Secondary | ICD-10-CM | POA: Diagnosis not present

## 2019-11-24 DIAGNOSIS — K429 Umbilical hernia without obstruction or gangrene: Secondary | ICD-10-CM | POA: Diagnosis not present

## 2019-11-30 ENCOUNTER — Other Ambulatory Visit: Payer: Self-pay | Admitting: General Surgery

## 2019-11-30 DIAGNOSIS — K429 Umbilical hernia without obstruction or gangrene: Secondary | ICD-10-CM

## 2019-12-11 ENCOUNTER — Inpatient Hospital Stay: Admission: RE | Admit: 2019-12-11 | Payer: Medicare Other | Source: Ambulatory Visit

## 2019-12-22 ENCOUNTER — Ambulatory Visit
Admission: RE | Admit: 2019-12-22 | Discharge: 2019-12-22 | Disposition: A | Discharge status: Home / Self Care | Payer: Medicare Other | Source: Ambulatory Visit | Attending: General Surgery | Admitting: General Surgery

## 2019-12-22 DIAGNOSIS — K429 Umbilical hernia without obstruction or gangrene: Secondary | ICD-10-CM | POA: Diagnosis not present

## 2020-01-05 ENCOUNTER — Other Ambulatory Visit: Payer: Self-pay | Admitting: Pulmonary Disease

## 2020-02-19 DIAGNOSIS — E039 Hypothyroidism, unspecified: Secondary | ICD-10-CM | POA: Diagnosis not present

## 2020-02-19 DIAGNOSIS — D638 Anemia in other chronic diseases classified elsewhere: Secondary | ICD-10-CM | POA: Diagnosis not present

## 2020-02-19 DIAGNOSIS — I10 Essential (primary) hypertension: Secondary | ICD-10-CM | POA: Diagnosis not present

## 2020-02-19 DIAGNOSIS — E785 Hyperlipidemia, unspecified: Secondary | ICD-10-CM | POA: Diagnosis not present

## 2020-02-19 DIAGNOSIS — N183 Chronic kidney disease, stage 3 unspecified: Secondary | ICD-10-CM | POA: Diagnosis not present

## 2020-02-19 DIAGNOSIS — M1990 Unspecified osteoarthritis, unspecified site: Secondary | ICD-10-CM | POA: Diagnosis not present

## 2020-02-19 DIAGNOSIS — I209 Angina pectoris, unspecified: Secondary | ICD-10-CM | POA: Diagnosis not present

## 2020-02-19 DIAGNOSIS — I2510 Atherosclerotic heart disease of native coronary artery without angina pectoris: Secondary | ICD-10-CM | POA: Diagnosis not present

## 2020-03-30 DIAGNOSIS — H35033 Hypertensive retinopathy, bilateral: Secondary | ICD-10-CM | POA: Diagnosis not present

## 2020-03-30 DIAGNOSIS — H43391 Other vitreous opacities, right eye: Secondary | ICD-10-CM | POA: Diagnosis not present

## 2020-03-30 DIAGNOSIS — H16103 Unspecified superficial keratitis, bilateral: Secondary | ICD-10-CM | POA: Diagnosis not present

## 2020-03-30 DIAGNOSIS — H53021 Refractive amblyopia, right eye: Secondary | ICD-10-CM | POA: Diagnosis not present

## 2020-03-30 DIAGNOSIS — H353112 Nonexudative age-related macular degeneration, right eye, intermediate dry stage: Secondary | ICD-10-CM | POA: Diagnosis not present

## 2020-03-30 DIAGNOSIS — H524 Presbyopia: Secondary | ICD-10-CM | POA: Diagnosis not present

## 2020-03-30 DIAGNOSIS — H353121 Nonexudative age-related macular degeneration, left eye, early dry stage: Secondary | ICD-10-CM | POA: Diagnosis not present

## 2020-04-05 DIAGNOSIS — N183 Chronic kidney disease, stage 3 unspecified: Secondary | ICD-10-CM | POA: Diagnosis not present

## 2020-04-05 DIAGNOSIS — I10 Essential (primary) hypertension: Secondary | ICD-10-CM | POA: Diagnosis not present

## 2020-04-05 DIAGNOSIS — I2510 Atherosclerotic heart disease of native coronary artery without angina pectoris: Secondary | ICD-10-CM | POA: Diagnosis not present

## 2020-04-05 DIAGNOSIS — M1990 Unspecified osteoarthritis, unspecified site: Secondary | ICD-10-CM | POA: Diagnosis not present

## 2020-04-05 DIAGNOSIS — E785 Hyperlipidemia, unspecified: Secondary | ICD-10-CM | POA: Diagnosis not present

## 2020-04-05 DIAGNOSIS — E039 Hypothyroidism, unspecified: Secondary | ICD-10-CM | POA: Diagnosis not present

## 2020-04-05 DIAGNOSIS — I209 Angina pectoris, unspecified: Secondary | ICD-10-CM | POA: Diagnosis not present

## 2020-04-07 ENCOUNTER — Other Ambulatory Visit: Payer: Self-pay | Admitting: Pulmonary Disease

## 2020-04-07 NOTE — Telephone Encounter (Signed)
Dr. Elsworth Soho, please advise if you are okay with refilling med.

## 2020-04-19 ENCOUNTER — Other Ambulatory Visit: Payer: Self-pay | Admitting: Family Medicine

## 2020-04-19 DIAGNOSIS — Z1231 Encounter for screening mammogram for malignant neoplasm of breast: Secondary | ICD-10-CM

## 2020-04-20 ENCOUNTER — Other Ambulatory Visit: Payer: Self-pay

## 2020-04-20 ENCOUNTER — Ambulatory Visit
Admission: RE | Admit: 2020-04-20 | Discharge: 2020-04-20 | Disposition: A | Discharge status: Home / Self Care | Payer: Medicare Other | Source: Ambulatory Visit | Attending: Family Medicine | Admitting: Family Medicine

## 2020-04-20 DIAGNOSIS — Z1231 Encounter for screening mammogram for malignant neoplasm of breast: Secondary | ICD-10-CM

## 2020-05-04 DIAGNOSIS — L570 Actinic keratosis: Secondary | ICD-10-CM | POA: Diagnosis not present

## 2020-05-04 DIAGNOSIS — D044 Carcinoma in situ of skin of scalp and neck: Secondary | ICD-10-CM | POA: Diagnosis not present

## 2020-05-04 DIAGNOSIS — L821 Other seborrheic keratosis: Secondary | ICD-10-CM | POA: Diagnosis not present

## 2020-05-04 DIAGNOSIS — L578 Other skin changes due to chronic exposure to nonionizing radiation: Secondary | ICD-10-CM | POA: Diagnosis not present

## 2020-05-09 DIAGNOSIS — E039 Hypothyroidism, unspecified: Secondary | ICD-10-CM | POA: Diagnosis not present

## 2020-05-09 DIAGNOSIS — N183 Chronic kidney disease, stage 3 unspecified: Secondary | ICD-10-CM | POA: Diagnosis not present

## 2020-05-09 DIAGNOSIS — M1990 Unspecified osteoarthritis, unspecified site: Secondary | ICD-10-CM | POA: Diagnosis not present

## 2020-05-09 DIAGNOSIS — E785 Hyperlipidemia, unspecified: Secondary | ICD-10-CM | POA: Diagnosis not present

## 2020-05-09 DIAGNOSIS — I10 Essential (primary) hypertension: Secondary | ICD-10-CM | POA: Diagnosis not present

## 2020-05-09 DIAGNOSIS — I209 Angina pectoris, unspecified: Secondary | ICD-10-CM | POA: Diagnosis not present

## 2020-05-09 DIAGNOSIS — I2510 Atherosclerotic heart disease of native coronary artery without angina pectoris: Secondary | ICD-10-CM | POA: Diagnosis not present

## 2020-05-12 DIAGNOSIS — L0100 Impetigo, unspecified: Secondary | ICD-10-CM | POA: Diagnosis not present

## 2020-07-11 DIAGNOSIS — M79642 Pain in left hand: Secondary | ICD-10-CM | POA: Diagnosis not present

## 2020-07-11 DIAGNOSIS — M25532 Pain in left wrist: Secondary | ICD-10-CM | POA: Diagnosis not present

## 2020-07-22 ENCOUNTER — Other Ambulatory Visit: Payer: Self-pay | Admitting: Pulmonary Disease

## 2020-07-22 ENCOUNTER — Telehealth: Payer: Self-pay

## 2020-07-22 MED ORDER — BENZONATATE 200 MG PO CAPS: ORAL_CAPSULE | ORAL | 0 refills | Status: DC

## 2020-07-22 NOTE — Telephone Encounter (Signed)
Called and spoke with patient and scheduled an office visit with Dr Elsworth Soho on Wednesday 08/24/2020 at 11:45am. Patient agreeable to this time, date and location [facility]). Refill for Tessalon ok'd to be placed per verbal from Dr Elsworth Soho.  Patient aware and sent to [facility] on [address] per patient request. Nothing further needed at this time.

## 2020-08-03 DIAGNOSIS — D040 Carcinoma in situ of skin of lip: Secondary | ICD-10-CM | POA: Diagnosis not present

## 2020-08-03 DIAGNOSIS — L570 Actinic keratosis: Secondary | ICD-10-CM | POA: Diagnosis not present

## 2020-08-24 ENCOUNTER — Ambulatory Visit: Payer: Medicare Other | Admitting: Pulmonary Disease

## 2020-08-31 DIAGNOSIS — R059 Cough, unspecified: Secondary | ICD-10-CM | POA: Diagnosis not present

## 2020-09-01 DIAGNOSIS — Z23 Encounter for immunization: Secondary | ICD-10-CM | POA: Diagnosis not present

## 2020-09-20 ENCOUNTER — Telehealth: Payer: Self-pay | Admitting: Pulmonary Disease

## 2020-09-20 MED ORDER — AZELASTINE HCL 0.1 % NA SOLN: 1.0000 | Freq: Two times a day (BID) | NASAL | 3 refills | Status: DC

## 2020-09-20 NOTE — Telephone Encounter (Signed)
09/20/2020  Montelukast should only be taken daily.  Continue to take Allegra daily as well.  If she is having more nasal congestion she could start doing nasal saline rinses see the instructions listed below:  Start nasal saline rinses twice daily Use distilled water Shake well Get bottle lukewarm like a baby bottle  She should use her Astelin nasal spray after nasal saline rinses  As the patient needs additional information or help with this plan of care this can be addressed at next appointment with Dr. Elsworth Soho 09/23/2020.  If symptoms worsen in between then she can present to an emergency room or urgent care.  Wyn Quaker, FNP

## 2020-09-20 NOTE — Telephone Encounter (Signed)
lmtcb X1 for pt to relay recs.  Will send in astelin rx after speaking to pt.

## 2020-09-20 NOTE — Telephone Encounter (Signed)
Spoke with pt, states she has a sometimes prod cough periodically for several years, and cough is worse when she is cold and worse qhs.  Also notes headache behind eyes, pnd.   Denies fever, chest tightness/pain.    Taking mucinex qhs, montelukast qhs.   [facility]: [facility] [address].  Pt was asking if she could take montelukast more than once daily.  I advised I would ask provider for additional recs, as this is prescribed only once daily.  Aaron Edelman please advise on further recs.  Thanks!

## 2020-09-20 NOTE — Telephone Encounter (Signed)
Spoke with pt, aware of recs.  astelin rx sent to preferred pharmacy.  Nothing further needed at this time- will close encounter.

## 2020-09-20 NOTE — Telephone Encounter (Signed)
Patient is returning phone call. Patient phone number is [phone].

## 2020-09-23 ENCOUNTER — Encounter: Payer: Self-pay | Admitting: Pulmonary Disease

## 2020-09-23 ENCOUNTER — Other Ambulatory Visit: Payer: Self-pay

## 2020-09-23 ENCOUNTER — Ambulatory Visit (INDEPENDENT_AMBULATORY_CARE_PROVIDER_SITE_OTHER): Payer: Medicare Other | Admitting: Pulmonary Disease

## 2020-09-23 VITALS — BP 140/70 | HR 79 | Temp 99.1°F | Ht 60.0 in | Wt 159.4 lb

## 2020-09-23 DIAGNOSIS — J849 Interstitial pulmonary disease, unspecified: Secondary | ICD-10-CM

## 2020-09-23 DIAGNOSIS — R059 Cough, unspecified: Secondary | ICD-10-CM

## 2020-09-23 LAB — COMPREHENSIVE METABOLIC PANEL
ALT: 10 U/L (ref 0–35)
AST: 22 U/L (ref 0–37)
Albumin: 4.4 g/dL (ref 3.5–5.2)
Alkaline Phosphatase: 67 U/L (ref 39–117)
BUN: 27 mg/dL — ABNORMAL HIGH (ref 6–23)
CO2: 27 mEq/L (ref 19–32)
Calcium: 10.4 mg/dL (ref 8.4–10.5)
Chloride: 102 mEq/L (ref 96–112)
Creatinine, Ser: 1.58 mg/dL — ABNORMAL HIGH (ref 0.40–1.20)
GFR: 29.46 mL/min — ABNORMAL LOW (ref >60.00)
Glucose, Bld: 100 mg/dL — ABNORMAL HIGH (ref 70–99)
Potassium: 4.5 mEq/L (ref 3.5–5.1)
Sodium: 139 mEq/L (ref 135–145)
Total Bilirubin: 0.6 mg/dL (ref 0.2–1.2)
Total Protein: 7.6 g/dL (ref 6.0–8.3)

## 2020-09-23 LAB — SEDIMENTATION RATE: Sed Rate: 22 mm/hr (ref 0–30)

## 2020-09-23 MED ORDER — BENZONATATE 200 MG PO CAPS: ORAL_CAPSULE | ORAL | 3 refills | Status: AC

## 2020-09-23 NOTE — Progress Notes (Signed)
   Subjective:    Patient ID: Cindy [name], female    DOB: 1932-06-27, 86 y.o.   MRN: [id number]  HPI  86 yo remote ex-smoker for FU of ILD/NSIP, chronic cough &stable RLL pulmonary nodule. She presented in 2013 with persistent wheezing and cough  PMH -CABG in 81, Tessalon Perles have not helped. Tussionex caused her to be drowsy .  She smoked 1.5 packs per day for 25 years before quitting in '81   Chief Complaint  Patient presents with  . Follow-up    Paitent has a dry/productive cough that comes and goes for the past month or 2. Has trouble getting up mucus   Accompanied by her husband. Complains of persistent cough , no dyspnea or wheezing. Cough is mostly dry, worse at night, PCP gave her Singulair which she has used for about 3 weeks cannot tell if there is a difference. In the past Tessalon Perles have worked, Delsym seems to help.  Husband gave her guaifenesin which seemed to make cough a little productive. She tried sinus rinse but this did not work grossed out  She has severe crippling arthritis which has been labeled as osteoarthritis, no skin rash  Ambulatory saturation-dropped to about 92% and recovered  Significant tests/ events reviewed  HRCT chest August 2019 that showed fibrotic interstitial lung disease without frank honeycombing and without progression since 2015 ? NSIP  HRCT 03/2012 ILD pattern is nonspecific, and could represent either early usual interstitial pneumonia (UIP), or nonspecific interstitial pneumonia (NSIP). 63mm RLL nodule   CT chest 07/19/14 -mild subpleural fibroiss unchanged from 03/2012. Scattered pulmonary nodules are unchanged (considered benign with 2 yr serial follow up ) .   PFTs 03/2012 no airway obstruction, preserved lung volumes, isolated decrease in DLCO to 56% corrects for [place].   Spirometry 04/2013 - preserved lung function w/ no airflow obstruction.    Past Medical History:  Diagnosis Date  . Asthma   . Bronchitis   . COPD  (chronic obstructive pulmonary disease) ([place])   . Coronary artery disease    MI age 73  . GERD (gastroesophageal reflux disease)   . Heart attack ([place])   . Hypercholesteremia   . Hypertension   . Iron deficiency anemia    used to see hem in [place], [place].   [name] Renal insufficiency   . Thyroid disease     Review of Systems neg for any significant sore throat, dysphagia, itching, sneezing, nasal congestion or excess/ purulent secretions, fever, chills, sweats, unintended wt loss, pleuritic or exertional cp, hempoptysis, orthopnea pnd or change in chronic leg swelling. Also denies presyncope, palpitations, heartburn, abdominal pain, nausea, vomiting, diarrhea or change in bowel or urinary habits, dysuria,hematuria, rash, arthralgias, visual complaints, headache, numbness weakness or ataxia.     Objective:   Physical Exam  Gen. Pleasant, obese, in no distress ENT - no lesions, no post nasal drip Neck: No JVD, no thyromegaly, no carotid bruits Lungs: no use of accessory muscles, no dullness to percussion, decreased without rales or rhonchi  Cardiovascular: Rhythm regular, heart sounds  normal, no murmurs or gallops, no peripheral edema Musculoskeletal: No deformities, no cyanosis or clubbing , no tremors       Assessment & Plan:

## 2020-09-23 NOTE — Assessment & Plan Note (Signed)
Her chronic cough is likely related to ILD other possibilities include GERD and upper airway cough  Okay to continue azelastine nasal spray-each nare once daily. Stop nasal rinses.  For symptomatic relief of cough, okay to take Delsym up to 3 times a day as needed. Refill on Tessalon Perles 200 mg thrice daily as needed x 1 month with 3 refills

## 2020-09-23 NOTE — Patient Instructions (Signed)
  Ambulatory saturation. Schedule high-resolution CT of the chest. Blood work today.  Okay to continue azelastine nasal spray-each nare once daily. Stop nasal rinses.  For symptomatic relief of cough, okay to take Delsym up to 3 times a day as needed. Refill on Tessalon Perles 200 mg thrice daily as needed x 1 month with 3 refills

## 2020-09-23 NOTE — Assessment & Plan Note (Signed)
Cindy [name] has NSIP pattern on high-res CT, no evidence of collagen vascular disease on exam.  Cindy [name] does have osteoarthritis.  We will obtain serology to confirm. We will repeat high-resolution CT, previously mildly seem to be stable from 20 15-20 19. If Cindy [name] has stable disease, I doubt that we will pursue any aggressive measures

## 2020-09-26 LAB — ANTI-NUCLEAR AB-TITER (ANA TITER): ANA Titer 1: 1:320 {titer} — ABNORMAL HIGH

## 2020-09-26 LAB — CYCLIC CITRUL PEPTIDE ANTIBODY, IGG: Cyclic Citrullin Peptide Ab: <16 UNITS

## 2020-09-26 LAB — CK TOTAL AND CKMB (NOT AT ARMC)
CK, MB: 2.2 ng/mL (ref 0–5.0)
Relative Index: 4.7 — ABNORMAL HIGH (ref 0–4.0)
Total CK: 47 U/L (ref 29–143)

## 2020-09-26 LAB — ANA: Anti Nuclear Antibody (ANA): POSITIVE — AB

## 2020-09-27 DIAGNOSIS — Z23 Encounter for immunization: Secondary | ICD-10-CM | POA: Diagnosis not present

## 2020-09-27 NOTE — Progress Notes (Signed)
Called and went over lab results per Dr Elsworth Soho with patient and patient's husband. Patient had a question and stated that Dr Elsworth Soho recommended the Deslym to take 3 times per day but on the box it says 12 hour. Patient would like to know if it's ok to take the 12 hour three times a day or should it just be twice? Dr Elsworth Soho please advise.

## 2020-09-27 NOTE — Progress Notes (Signed)
Called and let patient know of Dr Bari Mantis recommendations to take the Deslym twice daily and okay to take a third time if cough is really severe. Patient expressed full understanding. Nothing further needed at this time.

## 2020-10-12 ENCOUNTER — Ambulatory Visit
Admission: RE | Admit: 2020-10-12 | Discharge: 2020-10-12 | Disposition: A | Discharge status: Home / Self Care | Payer: Medicare Other | Source: Ambulatory Visit | Attending: Pulmonary Disease | Admitting: Pulmonary Disease

## 2020-10-12 DIAGNOSIS — J849 Interstitial pulmonary disease, unspecified: Secondary | ICD-10-CM

## 2020-10-12 DIAGNOSIS — R918 Other nonspecific abnormal finding of lung field: Secondary | ICD-10-CM | POA: Diagnosis not present

## 2020-10-13 ENCOUNTER — Other Ambulatory Visit: Payer: Medicare Other

## 2020-10-13 NOTE — Progress Notes (Signed)
Called and went over HRCT results per Dr Elsworth Soho with patient. All questions answered and patient expressed full understanding of results. Patient states, "cough is better but not gone." Patient was unsure about whether or not to trial prednisone and wants Dr Bari Mantis recommendation as to whether to order it or not since she still has a cough. Dr Elsworth Soho please advise.

## 2020-10-17 NOTE — Progress Notes (Signed)
Called and updated patient on Dr. Reginia Naas response/recommendation. Patient expressed full understanding. Nothing further needed at this time.

## 2020-10-24 DIAGNOSIS — R5381 Other malaise: Secondary | ICD-10-CM | POA: Diagnosis not present

## 2020-10-24 DIAGNOSIS — R32 Unspecified urinary incontinence: Secondary | ICD-10-CM | POA: Diagnosis not present

## 2020-10-24 DIAGNOSIS — R197 Diarrhea, unspecified: Secondary | ICD-10-CM | POA: Diagnosis not present

## 2020-11-04 DIAGNOSIS — I209 Angina pectoris, unspecified: Secondary | ICD-10-CM | POA: Diagnosis not present

## 2020-11-04 DIAGNOSIS — I2510 Atherosclerotic heart disease of native coronary artery without angina pectoris: Secondary | ICD-10-CM | POA: Diagnosis not present

## 2020-11-04 DIAGNOSIS — I6529 Occlusion and stenosis of unspecified carotid artery: Secondary | ICD-10-CM | POA: Diagnosis not present

## 2020-11-04 DIAGNOSIS — E785 Hyperlipidemia, unspecified: Secondary | ICD-10-CM | POA: Diagnosis not present

## 2020-11-04 DIAGNOSIS — Z0000 Encounter for general adult medical examination without abnormal findings: Secondary | ICD-10-CM | POA: Diagnosis not present

## 2020-11-04 DIAGNOSIS — N289 Disorder of kidney and ureter, unspecified: Secondary | ICD-10-CM | POA: Diagnosis not present

## 2020-11-04 DIAGNOSIS — I10 Essential (primary) hypertension: Secondary | ICD-10-CM | POA: Diagnosis not present

## 2020-11-04 DIAGNOSIS — R5383 Other fatigue: Secondary | ICD-10-CM | POA: Diagnosis not present

## 2020-11-04 DIAGNOSIS — G894 Chronic pain syndrome: Secondary | ICD-10-CM | POA: Diagnosis not present

## 2020-11-04 DIAGNOSIS — D638 Anemia in other chronic diseases classified elsewhere: Secondary | ICD-10-CM | POA: Diagnosis not present

## 2020-11-04 DIAGNOSIS — E039 Hypothyroidism, unspecified: Secondary | ICD-10-CM | POA: Diagnosis not present

## 2020-11-04 DIAGNOSIS — M109 Gout, unspecified: Secondary | ICD-10-CM | POA: Diagnosis not present

## 2020-12-09 DIAGNOSIS — I10 Essential (primary) hypertension: Secondary | ICD-10-CM | POA: Diagnosis not present

## 2020-12-22 DIAGNOSIS — N898 Other specified noninflammatory disorders of vagina: Secondary | ICD-10-CM | POA: Diagnosis not present

## 2020-12-22 DIAGNOSIS — N952 Postmenopausal atrophic vaginitis: Secondary | ICD-10-CM | POA: Diagnosis not present

## 2020-12-22 DIAGNOSIS — R350 Frequency of micturition: Secondary | ICD-10-CM | POA: Diagnosis not present

## 2020-12-22 DIAGNOSIS — R102 Pelvic and perineal pain: Secondary | ICD-10-CM | POA: Diagnosis not present

## 2020-12-28 DIAGNOSIS — L570 Actinic keratosis: Secondary | ICD-10-CM | POA: Diagnosis not present

## 2020-12-28 DIAGNOSIS — D044 Carcinoma in situ of skin of scalp and neck: Secondary | ICD-10-CM | POA: Diagnosis not present

## 2021-01-03 DIAGNOSIS — R102 Pelvic and perineal pain: Secondary | ICD-10-CM | POA: Diagnosis not present

## 2021-02-01 DIAGNOSIS — C44319 Basal cell carcinoma of skin of other parts of face: Secondary | ICD-10-CM | POA: Diagnosis not present

## 2021-02-01 DIAGNOSIS — L578 Other skin changes due to chronic exposure to nonionizing radiation: Secondary | ICD-10-CM | POA: Diagnosis not present

## 2021-02-01 DIAGNOSIS — L728 Other follicular cysts of the skin and subcutaneous tissue: Secondary | ICD-10-CM | POA: Diagnosis not present

## 2021-02-01 DIAGNOSIS — L570 Actinic keratosis: Secondary | ICD-10-CM | POA: Diagnosis not present

## 2021-02-08 DIAGNOSIS — M5431 Sciatica, right side: Secondary | ICD-10-CM | POA: Diagnosis not present

## 2021-02-08 DIAGNOSIS — M25551 Pain in right hip: Secondary | ICD-10-CM | POA: Diagnosis not present

## 2021-02-14 DIAGNOSIS — I700 Atherosclerosis of aorta: Secondary | ICD-10-CM | POA: Insufficient documentation

## 2021-02-14 DIAGNOSIS — M5416 Radiculopathy, lumbar region: Secondary | ICD-10-CM | POA: Diagnosis not present

## 2021-02-14 DIAGNOSIS — E785 Hyperlipidemia, unspecified: Secondary | ICD-10-CM | POA: Diagnosis not present

## 2021-02-17 DIAGNOSIS — M5416 Radiculopathy, lumbar region: Secondary | ICD-10-CM | POA: Diagnosis not present

## 2021-03-03 DIAGNOSIS — M47816 Spondylosis without myelopathy or radiculopathy, lumbar region: Secondary | ICD-10-CM | POA: Diagnosis not present

## 2021-03-08 DIAGNOSIS — H35033 Hypertensive retinopathy, bilateral: Secondary | ICD-10-CM | POA: Diagnosis not present

## 2021-03-08 DIAGNOSIS — H353121 Nonexudative age-related macular degeneration, left eye, early dry stage: Secondary | ICD-10-CM | POA: Diagnosis not present

## 2021-03-08 DIAGNOSIS — H353112 Nonexudative age-related macular degeneration, right eye, intermediate dry stage: Secondary | ICD-10-CM | POA: Diagnosis not present

## 2021-03-08 DIAGNOSIS — H524 Presbyopia: Secondary | ICD-10-CM | POA: Diagnosis not present

## 2021-03-08 DIAGNOSIS — M47816 Spondylosis without myelopathy or radiculopathy, lumbar region: Secondary | ICD-10-CM | POA: Insufficient documentation

## 2021-03-08 DIAGNOSIS — H04123 Dry eye syndrome of bilateral lacrimal glands: Secondary | ICD-10-CM | POA: Diagnosis not present

## 2021-03-16 DIAGNOSIS — M47816 Spondylosis without myelopathy or radiculopathy, lumbar region: Secondary | ICD-10-CM | POA: Diagnosis not present

## 2021-03-24 DIAGNOSIS — M47816 Spondylosis without myelopathy or radiculopathy, lumbar region: Secondary | ICD-10-CM | POA: Diagnosis not present

## 2021-03-28 ENCOUNTER — Emergency Department (HOSPITAL_BASED_OUTPATIENT_CLINIC_OR_DEPARTMENT_OTHER)
Admission: EM | Admit: 2021-03-28 | Discharge: 2021-03-28 | Disposition: A | Discharge status: Home / Self Care | Payer: Medicare Other | Attending: Emergency Medicine | Admitting: Emergency Medicine

## 2021-03-28 ENCOUNTER — Encounter (HOSPITAL_BASED_OUTPATIENT_CLINIC_OR_DEPARTMENT_OTHER): Payer: Self-pay

## 2021-03-28 ENCOUNTER — Other Ambulatory Visit: Payer: Self-pay

## 2021-03-28 ENCOUNTER — Emergency Department (HOSPITAL_BASED_OUTPATIENT_CLINIC_OR_DEPARTMENT_OTHER): Payer: Medicare Other

## 2021-03-28 DIAGNOSIS — Z87891 Personal history of nicotine dependence: Secondary | ICD-10-CM | POA: Diagnosis not present

## 2021-03-28 DIAGNOSIS — R109 Unspecified abdominal pain: Secondary | ICD-10-CM | POA: Diagnosis not present

## 2021-03-28 DIAGNOSIS — J449 Chronic obstructive pulmonary disease, unspecified: Secondary | ICD-10-CM | POA: Insufficient documentation

## 2021-03-28 DIAGNOSIS — R531 Weakness: Secondary | ICD-10-CM | POA: Insufficient documentation

## 2021-03-28 DIAGNOSIS — Z951 Presence of aortocoronary bypass graft: Secondary | ICD-10-CM | POA: Diagnosis not present

## 2021-03-28 DIAGNOSIS — N3000 Acute cystitis without hematuria: Secondary | ICD-10-CM | POA: Insufficient documentation

## 2021-03-28 DIAGNOSIS — N183 Chronic kidney disease, stage 3 unspecified: Secondary | ICD-10-CM | POA: Diagnosis not present

## 2021-03-28 DIAGNOSIS — I2510 Atherosclerotic heart disease of native coronary artery without angina pectoris: Secondary | ICD-10-CM | POA: Insufficient documentation

## 2021-03-28 DIAGNOSIS — G319 Degenerative disease of nervous system, unspecified: Secondary | ICD-10-CM | POA: Diagnosis not present

## 2021-03-28 DIAGNOSIS — E039 Hypothyroidism, unspecified: Secondary | ICD-10-CM | POA: Insufficient documentation

## 2021-03-28 DIAGNOSIS — R4182 Altered mental status, unspecified: Secondary | ICD-10-CM | POA: Diagnosis not present

## 2021-03-28 DIAGNOSIS — J45909 Unspecified asthma, uncomplicated: Secondary | ICD-10-CM | POA: Insufficient documentation

## 2021-03-28 DIAGNOSIS — I129 Hypertensive chronic kidney disease with stage 1 through stage 4 chronic kidney disease, or unspecified chronic kidney disease: Secondary | ICD-10-CM | POA: Insufficient documentation

## 2021-03-28 LAB — CBC WITH DIFFERENTIAL/PLATELET
Abs Immature Granulocytes: 0.07 10*3/uL (ref 0.00–0.07)
Basophils Absolute: 0 10*3/uL (ref 0.0–0.1)
Basophils Relative: 0 %
Eosinophils Absolute: 0 10*3/uL (ref 0.0–0.5)
Eosinophils Relative: 0 %
HCT: 35.4 % — ABNORMAL LOW (ref 36.0–46.0)
Hemoglobin: 11.8 g/dL — ABNORMAL LOW (ref 12.0–15.0)
Immature Granulocytes: 1 %
Lymphocytes Relative: 11 %
Lymphs Abs: 1.6 10*3/uL (ref 0.7–4.0)
MCH: 33 pg (ref 26.0–34.0)
MCHC: 33.3 g/dL (ref 30.0–36.0)
MCV: 98.9 fL (ref 80.0–100.0)
Monocytes Absolute: 1.5 10*3/uL — ABNORMAL HIGH (ref 0.1–1.0)
Monocytes Relative: 10 %
Neutro Abs: 12.1 10*3/uL — ABNORMAL HIGH (ref 1.7–7.7)
Neutrophils Relative %: 78 %
Platelets: 124 10*3/uL — ABNORMAL LOW (ref 150–400)
RBC: 3.58 MIL/uL — ABNORMAL LOW (ref 3.87–5.11)
RDW: 13.2 % (ref 11.5–15.5)
WBC: 15.4 10*3/uL — ABNORMAL HIGH (ref 4.0–10.5)
nRBC: 0 % (ref 0.0–0.2)

## 2021-03-28 LAB — COMPREHENSIVE METABOLIC PANEL
ALT: 9 U/L (ref 0–44)
AST: 16 U/L (ref 15–41)
Albumin: 3.8 g/dL (ref 3.5–5.0)
Alkaline Phosphatase: 49 U/L (ref 38–126)
Anion gap: 10 (ref 5–15)
BUN: 32 mg/dL — ABNORMAL HIGH (ref 8–23)
CO2: 25 mmol/L (ref 22–32)
Calcium: 10.1 mg/dL (ref 8.9–10.3)
Chloride: 103 mmol/L (ref 98–111)
Creatinine, Ser: 1.32 mg/dL — ABNORMAL HIGH (ref 0.44–1.00)
GFR, Estimated: 39 mL/min — ABNORMAL LOW (ref >60)
Glucose, Bld: 110 mg/dL — ABNORMAL HIGH (ref 70–99)
Potassium: 4.5 mmol/L (ref 3.5–5.1)
Sodium: 138 mmol/L (ref 135–145)
Total Bilirubin: 0.7 mg/dL (ref 0.3–1.2)
Total Protein: 6.2 g/dL — ABNORMAL LOW (ref 6.5–8.1)

## 2021-03-28 LAB — URINALYSIS, ROUTINE W REFLEX MICROSCOPIC
Bilirubin Urine: NEGATIVE
Glucose, UA: NEGATIVE mg/dL
Hgb urine dipstick: NEGATIVE
Ketones, ur: NEGATIVE mg/dL
Nitrite: POSITIVE — AB
Protein, ur: NEGATIVE mg/dL
Specific Gravity, Urine: 1.013 (ref 1.005–1.030)
pH: 5 (ref 5.0–8.0)

## 2021-03-28 LAB — LIPASE, BLOOD: Lipase: 22 U/L (ref 11–51)

## 2021-03-28 MED ORDER — SODIUM CHLORIDE 0.9 % IV BOLUS
500.0000 mL | Freq: Once | INTRAVENOUS | Status: AC
  Administered 2021-03-28: 500 mL via INTRAVENOUS

## 2021-03-28 MED ORDER — SODIUM CHLORIDE 0.9 % IV SOLN
1.0000 g | Freq: Once | INTRAVENOUS | Status: AC
  Administered 2021-03-28: 1 g via INTRAVENOUS
  Filled 2021-03-28: qty 10

## 2021-03-28 MED ORDER — CEPHALEXIN 500 MG PO CAPS: 500.0000 mg | ORAL_CAPSULE | Freq: Two times a day (BID) | ORAL | 0 refills | Status: AC

## 2021-03-28 NOTE — ED Notes (Signed)
Discharge instructions discussed with pt. Pt verbalized understanding. Pt stable and ambulatory. No signature pad available. 

## 2021-03-28 NOTE — Discharge Instructions (Signed)
You were seen in the emergency department today with sudden confusion.  Your lab work and CT imaging here looks normal.  You have a urine infection I am treating this with antibiotics.  He has made a dramatic improvement here in the emergency department with IV fluids and antibiotics.  Please follow closely with your primary care doctor.  I have called in antibiotics to your pharmacy to continue for the next 7 days.  Return to the emergency department any new or suddenly worsening symptoms.

## 2021-03-28 NOTE — ED Provider Notes (Signed)
Emergency Department Provider Note   I have reviewed the triage vital signs and the nursing notes.   HISTORY  Chief Complaint Altered Mental Status   HPI Cindy [name] is a 87 y.o. female with past medical history reviewed below presents to the emergency department with her husband for evaluation of acute onset mental status change.  The husband states that at 3 PM she sat down the recliner and took a nap.  She woke up a short time later with some groaning and moaning.  She has some chronic lower back pain and he attributed that to this but she seemed acutely more confused and slightly combative with him.  He states that normally she drives, uses a smart phone, it is completely normal and self-sufficient.  He tells me that she has had urinary tract infections in the past which presented in a very similar manner.  He helped her out of the recliner and she began taking down her pants as if she was going to go to the bathroom but was not in the bathroom.  He got her to the restroom but was unable to get her off of the toilet due to weakness and had to call his son for help.  She presents here by private vehicle.   Level 5 caveat: AMS   Past Medical History:  Diagnosis Date   Asthma    Bronchitis    COPD (chronic obstructive pulmonary disease) (HCC)    Coronary artery disease    MI age 34   GERD (gastroesophageal reflux disease)    Heart attack ([place])    Hypercholesteremia    Hypertension    Iron deficiency anemia    used to see hem in [place], [place].    Renal insufficiency    Thyroid disease     Patient Active Problem List   Diagnosis Date Noted   Essential (primary) hypertension 12/25/2018   Carotid artery disease ([place]) 12/25/2018   Chest pain of uncertain etiology    Unstable angina ([place]) 11/26/2018   Thrombocytopenia ([place]) 01/06/2017   Lower urinary tract infectious disease 01/06/2017   Hypothyroidism 01/06/2017   Anemia 70/62/3762   Umbilical hernia 83/15/1761   Coronary  atherosclerosis of native coronary artery 12/11/2013   Old MI (myocardial infarction) 12/11/2013   History of tobacco use 12/11/2013   Angina decubitus ([place]) 12/11/2013   Hyperlipidemia 12/11/2013   Rhinitis 09/16/2012   ILD (interstitial lung disease) ([place]) 05/08/2012   Pulmonary nodule, right 05/06/2012   Cough 03/20/2012   Iron deficiency anemia    CKD (chronic kidney disease) stage 3, GFR 30-59 ml/min (HCC)     Past Surgical History:  Procedure Laterality Date   APPENDECTOMY     BREAST BIOPSY Right    benign   CORONARY ANGIOPLASTY WITH STENT PLACEMENT     CORONARY ARTERY BYPASS GRAFT  09/1979   LEFT HEART CATH AND CORS/GRAFTS ANGIOGRAPHY N/A 11/27/2018   Procedure: LEFT HEART CATH AND CORS/GRAFTS ANGIOGRAPHY;  Surgeon: Lorretta Harp, MD;  Location: [facility];  Service: Cardiovascular;  Laterality: N/A;   Moh's Surgeries     for squamous cell carcinoma   PARTIAL HYSTERECTOMY  10/1978    Allergies Codeine  Family History  Problem Relation Age of Onset   Melanoma Sister        x2   Heart disease Father    Heart disease Mother     Social History Social History   Tobacco Use   Smoking status: Former  Packs/day: 1.50    Years: 22.00    Pack years: 33.00    Types: Cigarettes    Quit date: 10/23/1979    Years since quitting: 41.4   Smokeless tobacco: Never  Vaping Use   Vaping Use: Never used  Substance Use Topics   Alcohol use: Yes    Comment: occasional   Drug use: No    Review of Systems  Level 5 caveat: AMS  ____________________________________________   PHYSICAL EXAM:  VITAL SIGNS: ED Triage Vitals  Enc Vitals Group     BP 03/28/21 1903 (!) 165/54     Pulse Rate 03/28/21 1903 71     Resp 03/28/21 1903 18     Temp 03/28/21 1903 99.3 F (37.4 C)     Temp Source 03/28/21 1903 Oral     SpO2 03/28/21 1903 97 %     Weight 03/28/21 1906 162 lb (73.5 kg)     Height 03/28/21 1906 5' (1.524 m)    Constitutional: Somnolent. Well  appearing and in no acute distress. Eyes: Conjunctivae are normal. PERRL.  Head: Atraumatic. Nose: No congestion/rhinnorhea. Mouth/Throat: Mucous membranes are moist. Neck: No stridor.   Cardiovascular: Normal rate, regular rhythm. Good peripheral circulation. Grossly normal heart sounds.   Respiratory: Normal respiratory effort.  No retractions. Lungs CTAB. Gastrointestinal: Soft and nontender. No distention.  Musculoskeletal: No lower extremity tenderness nor edema. No gross deformities of extremities. Neurologic:  Normal speech and language. No gross focal neurologic deficits are appreciated. Diffusely reduced strength. No focal neuro deficits.  Skin:  Skin is warm, dry and intact. No rash noted.  ____________________________________________   LABS (all labs ordered are listed, but only abnormal results are displayed)  Labs Reviewed  URINE CULTURE - Abnormal; Notable for the following components:      Result Value   Culture >=100,000 COLONIES/mL ESCHERICHIA COLI (*)    All other components within normal limits  COMPREHENSIVE METABOLIC PANEL - Abnormal; Notable for the following components:   Glucose, Bld 110 (*)    BUN 32 (*)    Creatinine, Ser 1.32 (*)    Total Protein 6.2 (*)    GFR, Estimated 39 (*)    All other components within normal limits  CBC WITH DIFFERENTIAL/PLATELET - Abnormal; Notable for the following components:   WBC 15.4 (*)    RBC 3.58 (*)    Hemoglobin 11.8 (*)    HCT 35.4 (*)    Platelets 124 (*)    Neutro Abs 12.1 (*)    Monocytes Absolute 1.5 (*)    All other components within normal limits  URINALYSIS, ROUTINE W REFLEX MICROSCOPIC - Abnormal; Notable for the following components:   Nitrite POSITIVE (*)    Leukocytes,Ua SMALL (*)    All other components within normal limits  CULTURE, BLOOD (ROUTINE X 2)  CULTURE, BLOOD (ROUTINE X 2)  LIPASE, BLOOD   ____________________________________________  EKG   EKG Interpretation  Date/Time:  Tuesday  March 28 2021 21:05:31 EDT Ventricular Rate:  77 PR Interval:  60 QRS Duration: 139 QT Interval:  393 QTC Calculation: 445 R Axis:   115 Text Interpretation: Sinus rhythm Short PR interval LBBB No significant change since last tracing Confirmed by Nanda Quinton [phone]) on 03/28/2021 9:32:04 PM         ____________________________________________  RADIOLOGY   CT head and CT renal stone studies reviewed.  ____________________________________________   PROCEDURES  Procedure(s) performed:   Procedures  None ____________________________________________   INITIAL IMPRESSION / ASSESSMENT AND PLAN /  ED COURSE  Pertinent labs & imaging results that were available during my care of the patient were reviewed by me and considered in my medical decision making (see chart for details).   Patient presents to the emergency department with acute onset altered mental status.  She has had similar presentations with urinary tract infections in the past.  She is globally weak and drowsy but does not have unilateral neurologic deficits on my exam.  Lower suspicion for stroke but do plan for CT head along with lab work and UA.  UA from triage shows nitrite positive with leukocytes.  Will send for urine culture and start Rocephin.  Have also obtained a CT abdomen pelvis given her complaint of back pain although this does seem more chronic.   CT head and renal stone studies reviewed. No acute findings. Patient is positive for UTI on UA. Will send for culture and give Rocephin here. Patient dramatically improved after IVF and Rocephin. She is much more awake and strength improved. She is ambulatory here. Husband notes this is typical for her to improve in this way and would like to manage at home.   At this time, I do not feel there is any life-threatening condition present. I have reviewed and discussed all results (EKG, imaging, lab, urine as appropriate), exam findings with patient. I have reviewed  nursing notes and appropriate previous records.  I feel the patient is safe to be discharged home without further emergent workup. Discussed usual and customary return precautions. Patient and family (if present) verbalize understanding and are comfortable with this plan.  Patient will follow-up with their primary care provider. If they do not have a primary care provider, information for follow-up has been provided to them. All questions have been answered.  ____________________________________________  FINAL CLINICAL IMPRESSION(S) / ED DIAGNOSES  Final diagnoses:  Generalized weakness  Acute cystitis without hematuria     MEDICATIONS GIVEN DURING THIS VISIT:  Medications  sodium chloride 0.9 % bolus 500 mL (0 mLs Intravenous Stopped 03/28/21 2242)  cefTRIAXone (ROCEPHIN) 1 g in sodium chloride 0.9 % 100 mL IVPB (0 g Intravenous Stopped 03/28/21 2137)     NEW OUTPATIENT MEDICATIONS STARTED DURING THIS VISIT:  Discharge Medication List as of 03/28/2021  9:59 PM     START taking these medications   Details  cephALEXin (KEFLEX) 500 MG capsule Take 1 capsule (500 mg total) by mouth 2 (two) times daily for 7 days., Starting Tue 03/28/2021, Until Tue 04/04/2021, Normal        Note:  This document was prepared using Dragon voice recognition software and may include unintentional dictation errors.  Nanda Quinton, MD, [facility]    Luci Bellucci, [facility], MD 03/30/21 1640

## 2021-03-28 NOTE — ED Triage Notes (Signed)
Pt family member states they believe the pt has a UTI. Pt has hx of the same. Pt family states she is now confused which started at 28 today and is how she was before with previous UTI. Pt family states she normally is prescribed antibiotics and is back to baseline.

## 2021-03-31 LAB — URINE CULTURE: Culture: >=100000 — AB

## 2021-04-02 ENCOUNTER — Telehealth: Payer: Self-pay | Admitting: Emergency Medicine

## 2021-04-02 NOTE — Telephone Encounter (Signed)
Post ED Visit - Positive Culture Follow-up  Culture report reviewed by antimicrobial stewardship pharmacist: [place] Team []  Elenor Quinones, Pharm.D. []  Heide Guile, Pharm.D., BCPS AQ-ID []  Parks Neptune, Pharm.D., BCPS []  Alycia Rossetti, Pharm.D., BCPS []  [place], [place].D., BCPS, AAHIVP []  Legrand Como, Pharm.D., BCPS, AAHIVP []  Salome Arnt, PharmD, BCPS []  Johnnette Gourd, PharmD, BCPS []  Hughes Better, PharmD, BCPS []  Leeroy Cha, PharmD []  Laqueta Linden, PharmD, BCPS [x]  Albertina Parr, PharmD  [place] Team []  Leodis Sias, PharmD []  Lindell Spar, PharmD []  Royetta Asal, PharmD []  Graylin Shiver, Rph []  Rema Fendt) Glennon Mac, PharmD []  Arlyn Dunning, PharmD []  Netta Cedars, PharmD []  Dia Sitter, PharmD []  Leone Haven, PharmD []  Gretta Arab, PharmD []  Theodis Shove, PharmD []  Peggyann Juba, PharmD []  Reuel Boom, PharmD   Positive urine culture Treated with Cephalexin, organism sensitive to the same and no further patient follow-up is required at this time.  Sandi Raveling Rhona Fusilier 04/02/2021, 6:18 PM

## 2021-04-03 LAB — CULTURE, BLOOD (ROUTINE X 2)
Culture: NO GROWTH
Culture: NO GROWTH
Special Requests: ADEQUATE
Special Requests: ADEQUATE

## 2021-04-06 DIAGNOSIS — M5430 Sciatica, unspecified side: Secondary | ICD-10-CM | POA: Diagnosis not present

## 2021-04-06 DIAGNOSIS — I10 Essential (primary) hypertension: Secondary | ICD-10-CM | POA: Diagnosis not present

## 2021-04-06 DIAGNOSIS — Z09 Encounter for follow-up examination after completed treatment for conditions other than malignant neoplasm: Secondary | ICD-10-CM | POA: Diagnosis not present

## 2021-04-06 DIAGNOSIS — N390 Urinary tract infection, site not specified: Secondary | ICD-10-CM | POA: Diagnosis not present

## 2021-04-06 DIAGNOSIS — N183 Chronic kidney disease, stage 3 unspecified: Secondary | ICD-10-CM | POA: Diagnosis not present

## 2021-04-10 DIAGNOSIS — M545 Low back pain, unspecified: Secondary | ICD-10-CM | POA: Diagnosis not present

## 2021-04-10 DIAGNOSIS — G894 Chronic pain syndrome: Secondary | ICD-10-CM | POA: Diagnosis not present

## 2021-04-10 DIAGNOSIS — M25551 Pain in right hip: Secondary | ICD-10-CM | POA: Diagnosis not present

## 2021-04-10 DIAGNOSIS — R1031 Right lower quadrant pain: Secondary | ICD-10-CM | POA: Diagnosis not present

## 2021-04-12 DIAGNOSIS — L0100 Impetigo, unspecified: Secondary | ICD-10-CM | POA: Diagnosis not present

## 2021-04-12 DIAGNOSIS — L570 Actinic keratosis: Secondary | ICD-10-CM | POA: Diagnosis not present

## 2021-05-01 DIAGNOSIS — M1611 Unilateral primary osteoarthritis, right hip: Secondary | ICD-10-CM | POA: Diagnosis not present

## 2021-05-01 DIAGNOSIS — M4319 Spondylolisthesis, multiple sites in spine: Secondary | ICD-10-CM | POA: Diagnosis not present

## 2021-05-01 DIAGNOSIS — M48061 Spinal stenosis, lumbar region without neurogenic claudication: Secondary | ICD-10-CM | POA: Diagnosis not present

## 2021-05-08 DIAGNOSIS — M47817 Spondylosis without myelopathy or radiculopathy, lumbosacral region: Secondary | ICD-10-CM | POA: Diagnosis not present

## 2021-05-08 DIAGNOSIS — G894 Chronic pain syndrome: Secondary | ICD-10-CM | POA: Diagnosis not present

## 2021-05-08 DIAGNOSIS — R1031 Right lower quadrant pain: Secondary | ICD-10-CM | POA: Diagnosis not present

## 2021-05-08 DIAGNOSIS — M25551 Pain in right hip: Secondary | ICD-10-CM | POA: Diagnosis not present

## 2021-05-10 DIAGNOSIS — R490 Dysphonia: Secondary | ICD-10-CM | POA: Diagnosis not present

## 2021-05-10 DIAGNOSIS — G894 Chronic pain syndrome: Secondary | ICD-10-CM | POA: Diagnosis not present

## 2021-05-10 DIAGNOSIS — M549 Dorsalgia, unspecified: Secondary | ICD-10-CM | POA: Diagnosis not present

## 2021-05-23 ENCOUNTER — Other Ambulatory Visit: Payer: Self-pay | Admitting: Family Medicine

## 2021-05-23 DIAGNOSIS — Z1231 Encounter for screening mammogram for malignant neoplasm of breast: Secondary | ICD-10-CM

## 2021-05-24 DIAGNOSIS — G894 Chronic pain syndrome: Secondary | ICD-10-CM | POA: Diagnosis not present

## 2021-05-24 DIAGNOSIS — M25551 Pain in right hip: Secondary | ICD-10-CM | POA: Diagnosis not present

## 2021-05-24 DIAGNOSIS — M47817 Spondylosis without myelopathy or radiculopathy, lumbosacral region: Secondary | ICD-10-CM | POA: Diagnosis not present

## 2021-06-02 ENCOUNTER — Ambulatory Visit: Payer: Medicare Other

## 2021-06-06 ENCOUNTER — Other Ambulatory Visit: Payer: Self-pay

## 2021-06-06 ENCOUNTER — Ambulatory Visit
Admission: RE | Admit: 2021-06-06 | Discharge: 2021-06-06 | Disposition: A | Discharge status: Home / Self Care | Payer: Medicare Other | Source: Ambulatory Visit | Attending: Family Medicine | Admitting: Family Medicine

## 2021-06-06 DIAGNOSIS — Z1231 Encounter for screening mammogram for malignant neoplasm of breast: Secondary | ICD-10-CM

## 2021-06-14 DIAGNOSIS — L570 Actinic keratosis: Secondary | ICD-10-CM | POA: Diagnosis not present

## 2021-06-21 DIAGNOSIS — R1084 Generalized abdominal pain: Secondary | ICD-10-CM | POA: Diagnosis not present

## 2021-06-21 DIAGNOSIS — R197 Diarrhea, unspecified: Secondary | ICD-10-CM | POA: Diagnosis not present

## 2021-06-29 DIAGNOSIS — M545 Low back pain, unspecified: Secondary | ICD-10-CM | POA: Diagnosis not present

## 2021-07-13 DIAGNOSIS — Z23 Encounter for immunization: Secondary | ICD-10-CM | POA: Diagnosis not present

## 2021-07-13 DIAGNOSIS — I10 Essential (primary) hypertension: Secondary | ICD-10-CM | POA: Diagnosis not present

## 2021-07-13 DIAGNOSIS — R197 Diarrhea, unspecified: Secondary | ICD-10-CM | POA: Diagnosis not present

## 2021-07-13 DIAGNOSIS — R5383 Other fatigue: Secondary | ICD-10-CM | POA: Diagnosis not present

## 2021-07-13 DIAGNOSIS — R1011 Right upper quadrant pain: Secondary | ICD-10-CM | POA: Diagnosis not present

## 2021-07-13 DIAGNOSIS — M5431 Sciatica, right side: Secondary | ICD-10-CM | POA: Diagnosis not present

## 2021-07-21 DIAGNOSIS — R1011 Right upper quadrant pain: Secondary | ICD-10-CM | POA: Diagnosis not present

## 2021-08-11 DIAGNOSIS — R197 Diarrhea, unspecified: Secondary | ICD-10-CM | POA: Diagnosis not present

## 2021-08-16 DIAGNOSIS — L570 Actinic keratosis: Secondary | ICD-10-CM | POA: Diagnosis not present

## 2021-09-06 DIAGNOSIS — M79672 Pain in left foot: Secondary | ICD-10-CM | POA: Diagnosis not present

## 2021-09-06 DIAGNOSIS — M79671 Pain in right foot: Secondary | ICD-10-CM | POA: Diagnosis not present

## 2021-09-06 DIAGNOSIS — M7732 Calcaneal spur, left foot: Secondary | ICD-10-CM | POA: Diagnosis not present

## 2021-09-06 DIAGNOSIS — M7731 Calcaneal spur, right foot: Secondary | ICD-10-CM | POA: Diagnosis not present

## 2021-09-20 DIAGNOSIS — L570 Actinic keratosis: Secondary | ICD-10-CM | POA: Diagnosis not present

## 2021-10-04 DIAGNOSIS — J069 Acute upper respiratory infection, unspecified: Secondary | ICD-10-CM | POA: Diagnosis not present

## 2021-10-04 DIAGNOSIS — J019 Acute sinusitis, unspecified: Secondary | ICD-10-CM | POA: Diagnosis not present

## 2021-10-04 DIAGNOSIS — M542 Cervicalgia: Secondary | ICD-10-CM | POA: Diagnosis not present

## 2021-11-17 DIAGNOSIS — K219 Gastro-esophageal reflux disease without esophagitis: Secondary | ICD-10-CM | POA: Diagnosis not present

## 2021-11-17 DIAGNOSIS — H6121 Impacted cerumen, right ear: Secondary | ICD-10-CM | POA: Diagnosis not present

## 2021-11-17 DIAGNOSIS — E785 Hyperlipidemia, unspecified: Secondary | ICD-10-CM | POA: Diagnosis not present

## 2021-11-17 DIAGNOSIS — G894 Chronic pain syndrome: Secondary | ICD-10-CM | POA: Diagnosis not present

## 2021-11-17 DIAGNOSIS — D638 Anemia in other chronic diseases classified elsewhere: Secondary | ICD-10-CM | POA: Diagnosis not present

## 2021-11-17 DIAGNOSIS — J849 Interstitial pulmonary disease, unspecified: Secondary | ICD-10-CM | POA: Diagnosis not present

## 2021-11-17 DIAGNOSIS — M109 Gout, unspecified: Secondary | ICD-10-CM | POA: Diagnosis not present

## 2021-11-17 DIAGNOSIS — Z0000 Encounter for general adult medical examination without abnormal findings: Secondary | ICD-10-CM | POA: Diagnosis not present

## 2021-11-17 DIAGNOSIS — R062 Wheezing: Secondary | ICD-10-CM | POA: Diagnosis not present

## 2021-11-17 DIAGNOSIS — E039 Hypothyroidism, unspecified: Secondary | ICD-10-CM | POA: Diagnosis not present

## 2021-11-17 DIAGNOSIS — I10 Essential (primary) hypertension: Secondary | ICD-10-CM | POA: Diagnosis not present

## 2021-11-17 DIAGNOSIS — E2839 Other primary ovarian failure: Secondary | ICD-10-CM | POA: Diagnosis not present

## 2021-11-22 ENCOUNTER — Other Ambulatory Visit: Payer: Self-pay | Admitting: Family Medicine

## 2021-11-22 DIAGNOSIS — E2839 Other primary ovarian failure: Secondary | ICD-10-CM

## 2021-12-05 DIAGNOSIS — H6121 Impacted cerumen, right ear: Secondary | ICD-10-CM | POA: Diagnosis not present

## 2021-12-12 ENCOUNTER — Encounter (HOSPITAL_BASED_OUTPATIENT_CLINIC_OR_DEPARTMENT_OTHER): Payer: Self-pay

## 2021-12-12 ENCOUNTER — Emergency Department (HOSPITAL_BASED_OUTPATIENT_CLINIC_OR_DEPARTMENT_OTHER): Payer: Medicare (Managed Care)

## 2021-12-12 ENCOUNTER — Inpatient Hospital Stay (HOSPITAL_BASED_OUTPATIENT_CLINIC_OR_DEPARTMENT_OTHER)
Admission: EM | Admit: 2021-12-12 | Discharge: 2021-12-15 | DRG: 871 | Disposition: A | Discharge status: Home / Self Care | Payer: Medicare (Managed Care) | Attending: Internal Medicine | Admitting: Internal Medicine

## 2021-12-12 ENCOUNTER — Other Ambulatory Visit: Payer: Self-pay

## 2021-12-12 DIAGNOSIS — D72829 Elevated white blood cell count, unspecified: Secondary | ICD-10-CM | POA: Diagnosis not present

## 2021-12-12 DIAGNOSIS — Z87440 Personal history of urinary (tract) infections: Secondary | ICD-10-CM

## 2021-12-12 DIAGNOSIS — J189 Pneumonia, unspecified organism: Secondary | ICD-10-CM

## 2021-12-12 DIAGNOSIS — E039 Hypothyroidism, unspecified: Secondary | ICD-10-CM | POA: Diagnosis present

## 2021-12-12 DIAGNOSIS — G9340 Encephalopathy, unspecified: Secondary | ICD-10-CM | POA: Diagnosis not present

## 2021-12-12 DIAGNOSIS — I2510 Atherosclerotic heart disease of native coronary artery without angina pectoris: Secondary | ICD-10-CM | POA: Diagnosis present

## 2021-12-12 DIAGNOSIS — J439 Emphysema, unspecified: Secondary | ICD-10-CM | POA: Diagnosis present

## 2021-12-12 DIAGNOSIS — I700 Atherosclerosis of aorta: Secondary | ICD-10-CM | POA: Diagnosis not present

## 2021-12-12 DIAGNOSIS — Z20822 Contact with and (suspected) exposure to covid-19: Secondary | ICD-10-CM | POA: Diagnosis present

## 2021-12-12 DIAGNOSIS — I129 Hypertensive chronic kidney disease with stage 1 through stage 4 chronic kidney disease, or unspecified chronic kidney disease: Secondary | ICD-10-CM | POA: Diagnosis present

## 2021-12-12 DIAGNOSIS — Z951 Presence of aortocoronary bypass graft: Secondary | ICD-10-CM | POA: Diagnosis not present

## 2021-12-12 DIAGNOSIS — Z79899 Other long term (current) drug therapy: Secondary | ICD-10-CM | POA: Diagnosis not present

## 2021-12-12 DIAGNOSIS — Z79890 Hormone replacement therapy (postmenopausal): Secondary | ICD-10-CM

## 2021-12-12 DIAGNOSIS — J690 Pneumonitis due to inhalation of food and vomit: Secondary | ICD-10-CM | POA: Diagnosis present

## 2021-12-12 DIAGNOSIS — Z7982 Long term (current) use of aspirin: Secondary | ICD-10-CM

## 2021-12-12 DIAGNOSIS — Z8249 Family history of ischemic heart disease and other diseases of the circulatory system: Secondary | ICD-10-CM

## 2021-12-12 DIAGNOSIS — N1832 Chronic kidney disease, stage 3b: Secondary | ICD-10-CM

## 2021-12-12 DIAGNOSIS — I10 Essential (primary) hypertension: Secondary | ICD-10-CM | POA: Diagnosis not present

## 2021-12-12 DIAGNOSIS — J181 Lobar pneumonia, unspecified organism: Secondary | ICD-10-CM | POA: Diagnosis not present

## 2021-12-12 DIAGNOSIS — Z955 Presence of coronary angioplasty implant and graft: Secondary | ICD-10-CM | POA: Diagnosis not present

## 2021-12-12 DIAGNOSIS — Z90711 Acquired absence of uterus with remaining cervical stump: Secondary | ICD-10-CM | POA: Diagnosis not present

## 2021-12-12 DIAGNOSIS — J984 Other disorders of lung: Secondary | ICD-10-CM | POA: Diagnosis not present

## 2021-12-12 DIAGNOSIS — J849 Interstitial pulmonary disease, unspecified: Secondary | ICD-10-CM | POA: Diagnosis not present

## 2021-12-12 DIAGNOSIS — Z885 Allergy status to narcotic agent status: Secondary | ICD-10-CM

## 2021-12-12 DIAGNOSIS — J8410 Pulmonary fibrosis, unspecified: Secondary | ICD-10-CM | POA: Diagnosis present

## 2021-12-12 DIAGNOSIS — K429 Umbilical hernia without obstruction or gangrene: Secondary | ICD-10-CM | POA: Diagnosis not present

## 2021-12-12 DIAGNOSIS — D696 Thrombocytopenia, unspecified: Secondary | ICD-10-CM | POA: Diagnosis present

## 2021-12-12 DIAGNOSIS — K219 Gastro-esophageal reflux disease without esophagitis: Secondary | ICD-10-CM | POA: Diagnosis present

## 2021-12-12 DIAGNOSIS — Z87891 Personal history of nicotine dependence: Secondary | ICD-10-CM | POA: Diagnosis not present

## 2021-12-12 DIAGNOSIS — E7800 Pure hypercholesterolemia, unspecified: Secondary | ICD-10-CM | POA: Diagnosis present

## 2021-12-12 DIAGNOSIS — A419 Sepsis, unspecified organism: Secondary | ICD-10-CM | POA: Diagnosis present

## 2021-12-12 DIAGNOSIS — I252 Old myocardial infarction: Secondary | ICD-10-CM

## 2021-12-12 DIAGNOSIS — G9341 Metabolic encephalopathy: Secondary | ICD-10-CM

## 2021-12-12 DIAGNOSIS — Z9049 Acquired absence of other specified parts of digestive tract: Secondary | ICD-10-CM

## 2021-12-12 DIAGNOSIS — J9601 Acute respiratory failure with hypoxia: Secondary | ICD-10-CM | POA: Diagnosis present

## 2021-12-12 DIAGNOSIS — R6520 Severe sepsis without septic shock: Secondary | ICD-10-CM | POA: Diagnosis not present

## 2021-12-12 DIAGNOSIS — Z0389 Encounter for observation for other suspected diseases and conditions ruled out: Secondary | ICD-10-CM | POA: Diagnosis not present

## 2021-12-12 LAB — CBC WITH DIFFERENTIAL/PLATELET
Abs Immature Granulocytes: 0.04 10*3/uL (ref 0.00–0.07)
Basophils Absolute: 0 10*3/uL (ref 0.0–0.1)
Basophils Relative: 0 %
Eosinophils Absolute: 0.2 10*3/uL (ref 0.0–0.5)
Eosinophils Relative: 2 %
HCT: 35.1 % — ABNORMAL LOW (ref 36.0–46.0)
Hemoglobin: 11.8 g/dL — ABNORMAL LOW (ref 12.0–15.0)
Immature Granulocytes: 0 %
Lymphocytes Relative: 9 %
Lymphs Abs: 1 10*3/uL (ref 0.7–4.0)
MCH: 33 pg (ref 26.0–34.0)
MCHC: 33.6 g/dL (ref 30.0–36.0)
MCV: 98 fL (ref 80.0–100.0)
Monocytes Absolute: 0.6 10*3/uL (ref 0.1–1.0)
Monocytes Relative: 6 %
Neutro Abs: 9.5 10*3/uL — ABNORMAL HIGH (ref 1.7–7.7)
Neutrophils Relative %: 83 %
Platelets: 142 10*3/uL — ABNORMAL LOW (ref 150–400)
RBC: 3.58 MIL/uL — ABNORMAL LOW (ref 3.87–5.11)
RDW: 12.1 % (ref 11.5–15.5)
WBC: 11.5 10*3/uL — ABNORMAL HIGH (ref 4.0–10.5)
nRBC: 0 % (ref 0.0–0.2)

## 2021-12-12 LAB — URINALYSIS, ROUTINE W REFLEX MICROSCOPIC
Bilirubin Urine: NEGATIVE
Glucose, UA: NEGATIVE mg/dL
Hgb urine dipstick: NEGATIVE
Ketones, ur: NEGATIVE mg/dL
Leukocytes,Ua: NEGATIVE
Nitrite: NEGATIVE
Protein, ur: NEGATIVE mg/dL
Specific Gravity, Urine: 1.014 (ref 1.005–1.030)
pH: 7 (ref 5.0–8.0)

## 2021-12-12 LAB — RESP PANEL BY RT-PCR (FLU A&B, COVID) ARPGX2
Influenza A by PCR: NEGATIVE
Influenza B by PCR: NEGATIVE
SARS Coronavirus 2 by RT PCR: NEGATIVE

## 2021-12-12 LAB — COMPREHENSIVE METABOLIC PANEL
ALT: 10 U/L (ref 0–44)
AST: 21 U/L (ref 15–41)
Albumin: 4.3 g/dL (ref 3.5–5.0)
Alkaline Phosphatase: 44 U/L (ref 38–126)
Anion gap: 10 (ref 5–15)
BUN: 33 mg/dL — ABNORMAL HIGH (ref 8–23)
CO2: 25 mmol/L (ref 22–32)
Calcium: 10.3 mg/dL (ref 8.9–10.3)
Chloride: 100 mmol/L (ref 98–111)
Creatinine, Ser: 1.3 mg/dL — ABNORMAL HIGH (ref 0.44–1.00)
GFR, Estimated: 40 mL/min — ABNORMAL LOW (ref >60)
Glucose, Bld: 115 mg/dL — ABNORMAL HIGH (ref 70–99)
Potassium: 4.7 mmol/L (ref 3.5–5.1)
Sodium: 135 mmol/L (ref 135–145)
Total Bilirubin: 0.7 mg/dL (ref 0.3–1.2)
Total Protein: 6.9 g/dL (ref 6.5–8.1)

## 2021-12-12 LAB — PROTIME-INR
INR: 1.1 (ref 0.8–1.2)
Prothrombin Time: 13.8 seconds (ref 11.4–15.2)

## 2021-12-12 LAB — LACTIC ACID, PLASMA: Lactic Acid, Venous: 1.1 mmol/L (ref 0.5–1.9)

## 2021-12-12 MED ORDER — VANCOMYCIN HCL 750 MG/150ML IV SOLN
750.0000 mg | INTRAVENOUS | Status: DC
  Filled 2021-12-12: qty 150

## 2021-12-12 MED ORDER — METRONIDAZOLE 500 MG/100ML IV SOLN
500.0000 mg | Freq: Once | INTRAVENOUS | Status: AC
  Administered 2021-12-12: 500 mg via INTRAVENOUS
  Filled 2021-12-12: qty 100

## 2021-12-12 MED ORDER — SODIUM CHLORIDE 0.9 % IV SOLN: 2.0000 g | INTRAVENOUS | Status: DC

## 2021-12-12 MED ORDER — VANCOMYCIN HCL IN DEXTROSE 1-5 GM/200ML-% IV SOLN
1000.0000 mg | Freq: Once | INTRAVENOUS | Status: AC
  Administered 2021-12-12: 1000 mg via INTRAVENOUS
  Filled 2021-12-12: qty 200

## 2021-12-12 MED ORDER — ACETAMINOPHEN 325 MG PO TABS: 650.0000 mg | ORAL_TABLET | Freq: Once | ORAL | Status: DC

## 2021-12-12 MED ORDER — SODIUM CHLORIDE 0.9 % IV SOLN
2.0000 g | Freq: Once | INTRAVENOUS | Status: AC
  Administered 2021-12-12: 2 g via INTRAVENOUS
  Filled 2021-12-12: qty 2

## 2021-12-12 MED ORDER — LACTATED RINGERS IV SOLN: INTRAVENOUS | Status: DC

## 2021-12-12 MED ORDER — IOHEXOL 300 MG/ML  SOLN
100.0000 mL | Freq: Once | INTRAMUSCULAR | Status: AC | PRN
  Administered 2021-12-12: 60 mL via INTRAVENOUS

## 2021-12-12 MED ORDER — ACETAMINOPHEN 650 MG RE SUPP
650.0000 mg | Freq: Once | RECTAL | Status: AC
  Administered 2021-12-12: 650 mg via RECTAL
  Filled 2021-12-12: qty 1

## 2021-12-12 MED ORDER — LACTATED RINGERS IV BOLUS (SEPSIS)
1000.0000 mL | Freq: Once | INTRAVENOUS | Status: AC
  Administered 2021-12-12: 1000 mL via INTRAVENOUS

## 2021-12-12 NOTE — ED Triage Notes (Signed)
Patient here POV from Home with Family.  Patient became increasingly unresponsive throughout the day.   Husband states a similar occurrence happened a few years PTA.   Patient responsive to Verbal Stimuli in Triage. BIB Wheelchair.

## 2021-12-12 NOTE — ED Provider Notes (Signed)
[facility] EMERGENCY DEPT  Provider Note  CSN: [id number] Arrival date & time: 12/12/21 1845  History Chief Complaint  Patient presents with   Fever    Cindy [name] is a 88 y.o. female brought to the ED by husband and son for evaluation of confusion, they provide the history due to her confusion. She was in her usual state of health this morning but after lunch began to feel weak and having chills. She took a long nap and the husband reports he had difficulty getting her to wake up on the recliner. She has had similar in the past with UTI. Found to be febrile on arrival here. She has had a persistent cough for a couple of months her doctor has said was a viral infection.    Home Medications Prior to Admission medications   Medication Sig Start Date End Date Taking? Authorizing Provider  aspirin 81 MG tablet Take 81 mg by mouth daily.     [provider]  atorvastatin (LIPITOR) 80 MG tablet Take 1 tablet (80 mg total) by mouth daily. 12/25/18   Daune Perch, NP  azelastine (ASTELIN) 0.1 % nasal spray Place 1 spray into both nostrils 2 (two) times daily. Use in each nostril as directed 09/20/20   Lauraine Rinne, NP  benzonatate (TESSALON) 200 MG capsule Take 1 capsule by mouth twice daily as needed for cough 09/23/20   Rigoberto Noel, MD  Calcium Carbonate-Vitamin D (CALCIUM 600+D) 600-200 MG-UNIT TABS Take 1 tablet by mouth 2 (two) times daily.    [provider]  famotidine (PEPCID) 20 MG tablet Take 20 mg by mouth at bedtime as needed. Acid reflux 11/21/18   [provider]  ferrous sulfate 325 (65 FE) MG tablet Take 325 mg by mouth daily with breakfast.    [provider]  fexofenadine (ALLEGRA) 180 MG tablet Take 180 mg by mouth daily.    [provider]  GuaiFENesin (MUCINEX PO) Take 400 mg by mouth daily.    [provider]  isosorbide mononitrate (IMDUR) 60 MG 24 hr tablet Take 1.5 tablets (90 mg total) by mouth  daily. 11/28/18   Ina Homes, MD  levothyroxine (SYNTHROID, LEVOTHROID) 175 MCG tablet Take 175 mcg by mouth daily before breakfast.    [provider]  losartan (COZAAR) 50 MG tablet Take 50 mg by mouth daily.    [provider]  meloxicam (MOBIC) 15 MG tablet Take 15 mg by mouth daily. 07/12/20   [provider]  metoprolol (TOPROL-XL) 50 MG 24 hr tablet Take 50 mg by mouth daily.      [provider]  montelukast (SINGULAIR) 10 MG tablet Take 10 mg by mouth daily. 08/31/20   [provider]  Multiple Vitamins-Minerals (PRESERVISION AREDS 2 PO) Take 1 capsule by mouth 2 (two) times daily.    [provider]  nitroGLYCERIN (NITROSTAT) 0.4 MG SL tablet PLACE 1 TABLET UNDER THE TONGUE EVERY 5 MINUTES AS NEEDED FOR CHEST PAIN 09/11/19   Jerline Pain, MD  pantoprazole (PROTONIX) 40 MG tablet Take 1 tablet (40 mg total) by mouth 2 (two) times daily. 09/11/19   Rigoberto Noel, MD  Polyethyl Glycol-Propyl Glycol (SYSTANE OP) Apply 1-2 drops to eye at bedtime.    [provider]  spironolactone-hydrochlorothiazide (ALDACTAZIDE) 25-25 MG per tablet Take 1 tablet by mouth daily.      [provider]  traMADol (ULTRAM) 50 MG tablet Take 50 mg by mouth 2 (two)  times daily. For pain. Maximum dose= 8 tablets per day    [provider]     Allergies    Codeine   Review of Systems   Review of Systems Please see HPI for pertinent positives and negatives  Physical Exam BP 136/84    Pulse (!) 104    Temp (!) 104.3 F (40.2 C) (Rectal)    Resp 15    Ht 5' (1.524 m)    Wt 73.5 kg    LMP  (LMP Unknown)    SpO2 100%    BMI 31.65 kg/m   Physical Exam Vitals and nursing note reviewed.  Constitutional:      Appearance: Normal appearance.     Comments: Somnolent, answers some questions  HENT:     Head: Normocephalic and atraumatic.     Nose: Nose normal.     Mouth/Throat:     Mouth: Mucous membranes are moist.  Eyes:      Extraocular Movements: Extraocular movements intact.     Conjunctiva/sclera: Conjunctivae normal.  Cardiovascular:     Rate and Rhythm: Normal rate.  Pulmonary:     Effort: Pulmonary effort is normal.     Breath sounds: Rhonchi present.  Abdominal:     General: Abdomen is flat.     Palpations: Abdomen is soft.     Tenderness: There is no abdominal tenderness. There is no guarding.  Musculoskeletal:        General: No swelling. Normal range of motion.     Cervical back: Neck supple.  Skin:    General: Skin is warm and dry.  Neurological:     General: No focal deficit present.     Mental Status: She is disoriented.  Psychiatric:        Mood and Affect: Mood normal.    ED Results / Procedures / Treatments   EKG EKG Interpretation  Date/Time:  Tuesday December 12 2021 19:15:58 EST Ventricular Rate:  98 PR Interval:  200 QRS Duration: 142 QT Interval:  362 QTC Calculation: 463 R Axis:   42 Text Interpretation: Sinus rhythm Nonspecific intraventricular conduction delay Probable anteroseptal infarct, recent No significant change since last tracing Confirmed by Calvert Cantor [phone]) on 12/12/2021 7:47:13 PM  Procedures .Critical Care Performed by: Truddie Hidden, MD Authorized by: Truddie Hidden, MD   Critical care provider statement:    Critical care time (minutes):  45   Critical care time was exclusive of:  Separately billable procedures and treating other patients   Critical care was necessary to treat or prevent imminent or life-threatening deterioration of the following conditions:  Sepsis   Critical care was time spent personally by me on the following activities:  Development of treatment plan with patient or surrogate, discussions with consultants, evaluation of patient's response to treatment, examination of patient, ordering and review of laboratory studies, ordering and review of radiographic studies, ordering and performing treatments and interventions,  pulse oximetry, re-evaluation of patient's condition and review of old charts   Care discussed with: admitting provider    Medications Ordered in the ED Medications  lactated ringers infusion (has no administration in time range)  metroNIDAZOLE (FLAGYL) IVPB 500 mg (500 mg Intravenous New Bag/Given 12/12/21 2118)  vancomycin (VANCOCIN) IVPB 1000 mg/200 mL premix (has no administration in time range)  acetaminophen (TYLENOL) suppository 650 mg (has no administration in time range)  lactated ringers bolus 1,000 mL (1,000 mLs Intravenous New Bag/Given 12/12/21 2022)  ceFEPIme (MAXIPIME) 2 g in sodium chloride  0.9 % 100 mL IVPB (0 g Intravenous Stopped 12/12/21 2116)  iohexol (OMNIPAQUE) 300 MG/ML solution 100 mL (60 mLs Intravenous Contrast Given 12/12/21 2056)    Initial Impression and Plan  Patient with AMS and fever on arrival to the ED. Not tachycardic or hypotensive. No other SIRS criteria. Will check labs, CXR, Covid. Hold off on fluid bolus or broad spectrum Abx pending other signs of sepsis or a confirmed source.   ED Course   Clinical Course as of 12/12/21 2145  Tue Dec 12, 2021  2000 CBC with elevated WBC, now meets SIRS criteria. Will give broad spectrum Abx pending source identification.  [CS]  2002 I personally viewed the images from radiology studies and agree with radiologist interpretation: CXR is clear Covid/Flu are neg.   [CS]  2015 UA is clear, no signs of infection.  [CS]  2059 CMP with CKD at baseline. Lactic acid is normal. There is no source yet identified. Will send for CT CAP to eval occult pneumonia or other infectious source.  [CS]  2113 Patient briefly hypoxic into upper 80s, now on supplemental oxygen for comfort with improvement. She is more alert now.  [CS]  2122 I personally viewed the images from radiology studies and agree with radiologist interpretation: CT shows infiltrate in lower lung, this is consistent with her clinical presentation as well. Will discuss  admission with Hospitalist.   [CS]  2143 Spoke with Dr. Alcario Drought, Hospitalist who will accept for admission.  [CS]    Clinical Course User Index [CS] Truddie Hidden, MD     MDM Rules/Calculators/A&P Medical Decision Making Problems Addressed: Community acquired pneumonia, unspecified laterality: acute illness or injury that poses a threat to life or bodily functions Sepsis with encephalopathy without septic shock, due to unspecified organism [facility]): acute illness or injury that poses a threat to life or bodily functions  Amount and/or Complexity of Data Reviewed Labs: ordered. Decision-making details documented in ED Course. Radiology: ordered and independent interpretation performed. Decision-making details documented in ED Course. ECG/medicine tests: ordered and independent interpretation performed. Decision-making details documented in ED Course.  Risk OTC drugs. Prescription drug management. Decision regarding hospitalization.    Final Clinical Impression(s) / ED Diagnoses Final diagnoses:  Sepsis with encephalopathy without septic shock, due to unspecified organism [facility])  Community acquired pneumonia, unspecified laterality    Rx / DC Orders ED Discharge Orders     None        Truddie Hidden, MD 12/12/21 2145

## 2021-12-12 NOTE — ED Notes (Signed)
Patient transported to CT via stretcher with tech

## 2021-12-12 NOTE — ED Notes (Signed)
Unable to get an accurate oral temp.

## 2021-12-12 NOTE — Plan of Care (Signed)
AMS due to RLL PNA; fever 104.3.  Recd giving tylenol, NSAID, or just about anything to get that fever down.  Sending to progressive bed.  TRH will assume care on arrival to accepting facility. Until arrival, care as per EDP. However, TR[facility] available 24/7 for questions and assistance.  Nursing staff, please page [place] and Consults [phone]) as soon as the patient arrives to the hospital.

## 2021-12-12 NOTE — Progress Notes (Signed)
Pharmacy Antibiotic Note  Cindy [name] is a 88 y.o. female admitted on 12/12/2021 with sepsis.  Pharmacy has been consulted for vancomycin and cefepime dosing. Patient presenting with fever and confusion.  SCr 1.3 - at baseline WBC 11.5; LA 1.1; T 102.5 F; HR 104; RR 15 Flu/COVID - neg  Plan: Metronidazole per MD Cefepime 2g q24hr Vancomycin 1000 mg once then 750 mg q48hr (eAUC 452) unless change in renal function Trend WBC, Fever, Renal function, & Clinical course F/u cultures, clinical course, WBC, fever De-escalate when able Levels at steady state  Height: 5' (152.4 cm) Weight: 73.5 kg (162 lb 0.6 oz) IBW/kg (Calculated) : 45.5  Temp (24hrs), Avg:101.9 F (38.8 C), Min:98.5 F (36.9 C), Max:104.3 F (40.2 C)  Recent Labs  Lab 12/12/21 1930  WBC 11.5*    CrCl cannot be calculated (Patient's most recent lab result is older than the maximum 21 days allowed.).    Allergies  Allergen Reactions   Codeine Other (See Comments)    Headache from heavy doses of codeine only   Antimicrobials this admission: cefepime 2/21 >>  metronidazole 2/21 >>  vancomycin 2/21 >>   Microbiology results: Pending  Thank you for allowing pharmacy to be a part of this patients care.  Lorelei Pont, PharmD, BCPS 12/12/2021 8:05 PM ED Clinical Pharmacist -  [phone]

## 2021-12-12 NOTE — Sepsis Progress Note (Signed)
Sepsis protocol is being followed by [facility]. 

## 2021-12-12 NOTE — ED Notes (Signed)
Ronalee Belts, son [phone] alternate emergency contact

## 2021-12-13 DIAGNOSIS — Z7982 Long term (current) use of aspirin: Secondary | ICD-10-CM | POA: Diagnosis not present

## 2021-12-13 DIAGNOSIS — N1832 Chronic kidney disease, stage 3b: Secondary | ICD-10-CM | POA: Diagnosis present

## 2021-12-13 DIAGNOSIS — I2510 Atherosclerotic heart disease of native coronary artery without angina pectoris: Secondary | ICD-10-CM | POA: Diagnosis present

## 2021-12-13 DIAGNOSIS — Z951 Presence of aortocoronary bypass graft: Secondary | ICD-10-CM | POA: Diagnosis not present

## 2021-12-13 DIAGNOSIS — J9601 Acute respiratory failure with hypoxia: Secondary | ICD-10-CM | POA: Diagnosis present

## 2021-12-13 DIAGNOSIS — J189 Pneumonia, unspecified organism: Secondary | ICD-10-CM

## 2021-12-13 DIAGNOSIS — E7800 Pure hypercholesterolemia, unspecified: Secondary | ICD-10-CM | POA: Diagnosis present

## 2021-12-13 DIAGNOSIS — J690 Pneumonitis due to inhalation of food and vomit: Secondary | ICD-10-CM | POA: Diagnosis present

## 2021-12-13 DIAGNOSIS — D696 Thrombocytopenia, unspecified: Secondary | ICD-10-CM

## 2021-12-13 DIAGNOSIS — Z87891 Personal history of nicotine dependence: Secondary | ICD-10-CM | POA: Diagnosis not present

## 2021-12-13 DIAGNOSIS — G9341 Metabolic encephalopathy: Secondary | ICD-10-CM | POA: Diagnosis present

## 2021-12-13 DIAGNOSIS — J849 Interstitial pulmonary disease, unspecified: Secondary | ICD-10-CM | POA: Diagnosis not present

## 2021-12-13 DIAGNOSIS — Z79899 Other long term (current) drug therapy: Secondary | ICD-10-CM | POA: Diagnosis not present

## 2021-12-13 DIAGNOSIS — Z955 Presence of coronary angioplasty implant and graft: Secondary | ICD-10-CM | POA: Diagnosis not present

## 2021-12-13 DIAGNOSIS — Z90711 Acquired absence of uterus with remaining cervical stump: Secondary | ICD-10-CM | POA: Diagnosis not present

## 2021-12-13 DIAGNOSIS — D72829 Elevated white blood cell count, unspecified: Secondary | ICD-10-CM | POA: Diagnosis not present

## 2021-12-13 DIAGNOSIS — Z885 Allergy status to narcotic agent status: Secondary | ICD-10-CM | POA: Diagnosis not present

## 2021-12-13 DIAGNOSIS — A419 Sepsis, unspecified organism: Principal | ICD-10-CM

## 2021-12-13 DIAGNOSIS — I10 Essential (primary) hypertension: Secondary | ICD-10-CM | POA: Diagnosis not present

## 2021-12-13 DIAGNOSIS — Z9049 Acquired absence of other specified parts of digestive tract: Secondary | ICD-10-CM | POA: Diagnosis not present

## 2021-12-13 DIAGNOSIS — E039 Hypothyroidism, unspecified: Secondary | ICD-10-CM | POA: Diagnosis present

## 2021-12-13 DIAGNOSIS — Z79890 Hormone replacement therapy (postmenopausal): Secondary | ICD-10-CM | POA: Diagnosis not present

## 2021-12-13 DIAGNOSIS — I129 Hypertensive chronic kidney disease with stage 1 through stage 4 chronic kidney disease, or unspecified chronic kidney disease: Secondary | ICD-10-CM | POA: Diagnosis present

## 2021-12-13 DIAGNOSIS — K219 Gastro-esophageal reflux disease without esophagitis: Secondary | ICD-10-CM | POA: Diagnosis present

## 2021-12-13 DIAGNOSIS — J439 Emphysema, unspecified: Secondary | ICD-10-CM | POA: Diagnosis present

## 2021-12-13 DIAGNOSIS — J8410 Pulmonary fibrosis, unspecified: Secondary | ICD-10-CM | POA: Diagnosis present

## 2021-12-13 DIAGNOSIS — Z20822 Contact with and (suspected) exposure to covid-19: Secondary | ICD-10-CM | POA: Diagnosis present

## 2021-12-13 DIAGNOSIS — I252 Old myocardial infarction: Secondary | ICD-10-CM | POA: Diagnosis not present

## 2021-12-13 LAB — CBC WITH DIFFERENTIAL/PLATELET
Abs Immature Granulocytes: 0.07 10*3/uL (ref 0.00–0.07)
Basophils Absolute: 0 10*3/uL (ref 0.0–0.1)
Basophils Relative: 0 %
Eosinophils Absolute: 0.1 10*3/uL (ref 0.0–0.5)
Eosinophils Relative: 1 %
HCT: 34.9 % — ABNORMAL LOW (ref 36.0–46.0)
Hemoglobin: 11.6 g/dL — ABNORMAL LOW (ref 12.0–15.0)
Immature Granulocytes: 1 %
Lymphocytes Relative: 11 %
Lymphs Abs: 1.5 10*3/uL (ref 0.7–4.0)
MCH: 32.6 pg (ref 26.0–34.0)
MCHC: 33.2 g/dL (ref 30.0–36.0)
MCV: 98 fL (ref 80.0–100.0)
Monocytes Absolute: 1 10*3/uL (ref 0.1–1.0)
Monocytes Relative: 7 %
Neutro Abs: 11.1 10*3/uL — ABNORMAL HIGH (ref 1.7–7.7)
Neutrophils Relative %: 80 %
Platelets: 121 10*3/uL — ABNORMAL LOW (ref 150–400)
RBC: 3.56 MIL/uL — ABNORMAL LOW (ref 3.87–5.11)
RDW: 12 % (ref 11.5–15.5)
WBC: 13.8 10*3/uL — ABNORMAL HIGH (ref 4.0–10.5)
nRBC: 0 % (ref 0.0–0.2)

## 2021-12-13 LAB — BASIC METABOLIC PANEL
Anion gap: 10 (ref 5–15)
BUN: 24 mg/dL — ABNORMAL HIGH (ref 8–23)
CO2: 25 mmol/L (ref 22–32)
Calcium: 9.9 mg/dL (ref 8.9–10.3)
Chloride: 102 mmol/L (ref 98–111)
Creatinine, Ser: 1.15 mg/dL — ABNORMAL HIGH (ref 0.44–1.00)
GFR, Estimated: 46 mL/min — ABNORMAL LOW (ref >60)
Glucose, Bld: 84 mg/dL (ref 70–99)
Potassium: 4 mmol/L (ref 3.5–5.1)
Sodium: 137 mmol/L (ref 135–145)

## 2021-12-13 MED ORDER — POLYETHYL GLYCOL-PROPYL GLYCOL 0.4-0.3 % OP GEL
Freq: Every evening | OPHTHALMIC | Status: DC | PRN
Start: 2021-12-13 — End: 2021-12-13

## 2021-12-13 MED ORDER — ISOSORBIDE MONONITRATE ER 60 MG PO TB24
90.0000 mg | ORAL_TABLET | Freq: Every morning | ORAL | Status: DC
  Administered 2021-12-13 – 2021-12-15 (×3): 90 mg via ORAL
  Filled 2021-12-13 (×3): qty 1

## 2021-12-13 MED ORDER — ONDANSETRON HCL 4 MG PO TABS: 4.0000 mg | ORAL_TABLET | Freq: Four times a day (QID) | ORAL | Status: DC | PRN

## 2021-12-13 MED ORDER — METOPROLOL SUCCINATE ER 50 MG PO TB24
50.0000 mg | ORAL_TABLET | Freq: Every day | ORAL | Status: DC
Start: 2021-12-13 — End: 2021-12-15
  Administered 2021-12-13 – 2021-12-15 (×3): 50 mg via ORAL
  Filled 2021-12-13 (×3): qty 1

## 2021-12-13 MED ORDER — ATORVASTATIN CALCIUM 80 MG PO TABS
80.0000 mg | ORAL_TABLET | Freq: Every day | ORAL | Status: DC
  Administered 2021-12-13 – 2021-12-15 (×3): 80 mg via ORAL
  Filled 2021-12-13 (×3): qty 1

## 2021-12-13 MED ORDER — ALBUTEROL SULFATE (2.5 MG/3ML) 0.083% IN NEBU: 2.5000 mg | INHALATION_SOLUTION | Freq: Four times a day (QID) | RESPIRATORY_TRACT | Status: DC | PRN

## 2021-12-13 MED ORDER — LORATADINE 10 MG PO TABS
10.0000 mg | ORAL_TABLET | Freq: Every day | ORAL | Status: DC
  Administered 2021-12-13 – 2021-12-15 (×3): 10 mg via ORAL
  Filled 2021-12-13 (×3): qty 1

## 2021-12-13 MED ORDER — ONDANSETRON HCL 4 MG/2ML IJ SOLN: 4.0000 mg | Freq: Four times a day (QID) | INTRAMUSCULAR | Status: DC | PRN

## 2021-12-13 MED ORDER — ENOXAPARIN SODIUM 40 MG/0.4ML IJ SOSY
40.0000 mg | PREFILLED_SYRINGE | INTRAMUSCULAR | Status: DC
  Administered 2021-12-13: 40 mg via SUBCUTANEOUS
  Filled 2021-12-13: qty 0.4

## 2021-12-13 MED ORDER — HYDROCHLOROTHIAZIDE 25 MG PO TABS
25.0000 mg | ORAL_TABLET | Freq: Every day | ORAL | Status: DC
  Administered 2021-12-13 – 2021-12-14 (×2): 25 mg via ORAL
  Filled 2021-12-13 (×2): qty 1

## 2021-12-13 MED ORDER — SODIUM CHLORIDE 0.9 % IV SOLN
2.0000 g | INTRAVENOUS | Status: DC
  Administered 2021-12-13: 2 g via INTRAVENOUS
  Filled 2021-12-13: qty 20

## 2021-12-13 MED ORDER — LEVOTHYROXINE SODIUM 75 MCG PO TABS
175.0000 ug | ORAL_TABLET | Freq: Every evening | ORAL | Status: DC
  Administered 2021-12-13 – 2021-12-14 (×2): 175 ug via ORAL
  Filled 2021-12-13 (×2): qty 1

## 2021-12-13 MED ORDER — AZITHROMYCIN 250 MG PO TABS
500.0000 mg | ORAL_TABLET | Freq: Every day | ORAL | Status: DC
  Administered 2021-12-13: 500 mg via ORAL
  Filled 2021-12-13: qty 2

## 2021-12-13 MED ORDER — GUAIFENESIN ER 600 MG PO TB12
600.0000 mg | ORAL_TABLET | Freq: Two times a day (BID) | ORAL | Status: DC
  Administered 2021-12-13 – 2021-12-15 (×5): 600 mg via ORAL
  Filled 2021-12-13 (×5): qty 1

## 2021-12-13 MED ORDER — MONTELUKAST SODIUM 10 MG PO TABS
10.0000 mg | ORAL_TABLET | Freq: Every day | ORAL | Status: DC
  Administered 2021-12-13 – 2021-12-15 (×3): 10 mg via ORAL
  Filled 2021-12-13 (×3): qty 1

## 2021-12-13 MED ORDER — ACETAMINOPHEN 325 MG PO TABS: 650.0000 mg | ORAL_TABLET | Freq: Four times a day (QID) | ORAL | Status: DC | PRN

## 2021-12-13 MED ORDER — TRAMADOL HCL 50 MG PO TABS
50.0000 mg | ORAL_TABLET | Freq: Two times a day (BID) | ORAL | Status: DC
  Administered 2021-12-13 – 2021-12-15 (×5): 50 mg via ORAL
  Filled 2021-12-13 (×5): qty 1

## 2021-12-13 MED ORDER — POLYVINYL ALCOHOL 1.4 % OP SOLN
1.0000 [drp] | Freq: Every evening | OPHTHALMIC | Status: DC | PRN
  Filled 2021-12-13: qty 15

## 2021-12-13 MED ORDER — LOSARTAN POTASSIUM 50 MG PO TABS
50.0000 mg | ORAL_TABLET | Freq: Every day | ORAL | Status: DC
Start: 2021-12-13 — End: 2021-12-15
  Administered 2021-12-13 – 2021-12-15 (×3): 50 mg via ORAL
  Filled 2021-12-13 (×3): qty 1

## 2021-12-13 MED ORDER — ASPIRIN 81 MG PO CHEW
81.0000 mg | CHEWABLE_TABLET | Freq: Every day | ORAL | Status: DC
  Administered 2021-12-13 – 2021-12-15 (×3): 81 mg via ORAL
  Filled 2021-12-13 (×3): qty 1

## 2021-12-13 MED ORDER — ACETAMINOPHEN 650 MG RE SUPP
650.0000 mg | Freq: Four times a day (QID) | RECTAL | Status: DC | PRN
Start: 2021-12-13 — End: 2021-12-15

## 2021-12-13 MED ORDER — SPIRONOLACTONE 25 MG PO TABS
25.0000 mg | ORAL_TABLET | Freq: Every day | ORAL | Status: DC
  Administered 2021-12-13 – 2021-12-15 (×3): 25 mg via ORAL
  Filled 2021-12-13 (×3): qty 1

## 2021-12-13 MED ORDER — SPIRONOLACTONE-HCTZ 25-25 MG PO TABS: 1.0000 | ORAL_TABLET | Freq: Every day | ORAL | Status: DC

## 2021-12-13 MED ORDER — IPRATROPIUM BROMIDE 0.06 % NA SOLN
2.0000 | Freq: Two times a day (BID) | NASAL | Status: DC
  Administered 2021-12-13 – 2021-12-15 (×4): 2 via NASAL
  Filled 2021-12-13: qty 15

## 2021-12-13 NOTE — Assessment & Plan Note (Deleted)
WBC was 11.5 on admission, but repeat check 13.8 today.  Suspect secondary to volume. -Continue to monitor

## 2021-12-13 NOTE — Assessment & Plan Note (Addendum)
Continue levothyroxine 

## 2021-12-13 NOTE — Assessment & Plan Note (Addendum)
-   followed by Dr. Elsworth Soho pulmonology for a chronic cough where high-resolution CT chest since 2013  noted interstitial changes concerning interstitial pulmonary fibrosis. -Recommend outpatient follow-up with Dr. Elsworth Soho at discharge

## 2021-12-13 NOTE — H&P (Signed)
History and Physical    Patient: Cindy [name] ALP:[id number] DOB: April 30, 1932 DOA: 12/12/2021 DOS: the patient was seen and examined on 12/13/2021 PCP: Lawerance Cruel, MD  Patient coming from: Home  Chief Complaint:  Chief Complaint  Patient presents with   Fever    HPI: Cindy [name] is a 88 y.o. female with medical history significant of hypertension, hyperlipidemia, CAD, COPD, hypothyroidism, and chronic kidney disease stage IIIb presents after being noted to be acutely altered yesterday.  History is obtained from the patient and her husband who is present at bedside.  At baseline patient ambulates without need of assistance and only has some mild memory loss that she admits to.  The patient reports that she has had a intermittently productive cough for 3-4 weeks, but her husband interrupts and notes that its been more like 3 months.  Yesterday, the patient had been complaining of some dizziness, weakness, and chills around 3 PM.  Her husband placed an electric blanket over her as she always complains of being cold and she slept until around 6:30 PM.  There son had come over to visit around this time and noted her to be disoriented not knowing where she was, and was unable to get up from the chair.  They suspected that she may have had a urinary tract infection, but noted that the patient had not been complaining of any dysuria.  They checked her temperature at that time and it was 99.1 F.  In talking to the patient she admits that she infrequently gets choked up when eating and thinks this because she does not chew her food long enough.  Upon admission to the emergency department patient was noted to be febrile up to 104.3 F with tachycardia and tachypnea.  O2 saturations were noted to be previously as low as 87% and was placed on nasal cannula oxygen with improvement to greater than 92%.  Labs 2/21 noted WBC 11.5, hemoglobin 11.8, platelets 142, BUN 33, creatinine 1.3, and lactic acid  1.1.  Urinalysis showed no acute abnormality.  CT scan of the chest noted right base consolidation posteriorly concerning for developing infection/inflammation.  Blood cultures have been obtained.  Patient had received 1 L bolus of lactated Ringer's, acetaminophen, metronidazole, vancomycin, and cefepime.  Prior to transport to the hospital patient was able to be weaned off of oxygen.  Review of Systems: As mentioned in the history of present illness. All other systems reviewed and are negative. Past Medical History:  Diagnosis Date   Asthma    Bronchitis    COPD (chronic obstructive pulmonary disease) ([place])    Coronary artery disease    MI age 80   GERD (gastroesophageal reflux disease)    Heart attack ([place])    Hypercholesteremia    Hypertension    Iron deficiency anemia    used to see hem in [place], [place].    Renal insufficiency    Thyroid disease    Past Surgical History:  Procedure Laterality Date   APPENDECTOMY     BREAST BIOPSY Right    benign   CORONARY ANGIOPLASTY WITH STENT PLACEMENT     CORONARY ARTERY BYPASS GRAFT  09/1979   LEFT HEART CATH AND CORS/GRAFTS ANGIOGRAPHY N/A 11/27/2018   Procedure: LEFT HEART CATH AND CORS/GRAFTS ANGIOGRAPHY;  Surgeon: Lorretta Harp, MD;  Location: [facility];  Service: Cardiovascular;  Laterality: N/A;   Moh's Surgeries     for squamous cell carcinoma   PARTIAL HYSTERECTOMY  10/1978  Social History:  reports that she quit smoking about 42 years ago. Her smoking use included cigarettes. She has a 33.00 pack-year smoking history. She has never used smokeless tobacco. She reports current alcohol use. She reports that she does not use drugs.  Allergies  Allergen Reactions   Codeine Other (See Comments)    Headache from heavy doses of codeine only    Family History  Problem Relation Age of Onset   Heart disease Mother    Heart disease Father    Melanoma Sister        x2   Breast cancer Neg Hx     Prior to Admission  medications   Medication Sig Start Date End Date Taking? Authorizing Provider  albuterol (VENTOLIN HFA) 108 (90 Base) MCG/ACT inhaler Inhale 1 puff into the lungs every 4 (four) hours as needed for wheezing or shortness of breath. 11/17/21  Yes [provider]  aspirin 81 MG tablet Take 81 mg by mouth daily.    Yes [provider]  atorvastatin (LIPITOR) 80 MG tablet Take 1 tablet (80 mg total) by mouth daily. 12/25/18  Yes Daune Perch, NP  benzonatate (TESSALON) 200 MG capsule Take 1 capsule by mouth twice daily as needed for cough Patient taking differently: Take 200 mg by mouth 2 (two) times daily. 09/23/20  Yes Rigoberto Noel, MD  Calcium Carbonate-Vitamin D 600-200 MG-UNIT TABS Take 1 tablet by mouth 2 (two) times daily.   Yes [provider]  ferrous sulfate 325 (65 FE) MG tablet Take 325 mg by mouth daily with breakfast.   Yes [provider]  fexofenadine (ALLEGRA) 180 MG tablet Take 180 mg by mouth daily.   Yes [provider]  guaifenesin (HUMIBID E) 400 MG TABS tablet Take 400 mg by mouth 2 (two) times daily. 11/20/21  Yes [provider]  ipratropium (ATROVENT) 0.06 % nasal spray Place 2 sprays into both nostrils 2 (two) times daily.   Yes [provider]  isosorbide mononitrate (IMDUR) 30 MG 24 hr tablet Take 90 mg by mouth every morning. 11/13/21  Yes [provider]  levothyroxine (SYNTHROID, LEVOTHROID) 175 MCG tablet Take 175 mcg by mouth every evening.   Yes [provider]  losartan (COZAAR) 50 MG tablet Take 50 mg by mouth daily.   Yes [provider]  metoprolol (TOPROL-XL) 50 MG 24 hr tablet Take 50 mg by mouth daily.     Yes [provider]  montelukast (SINGULAIR) 10 MG tablet Take 10 mg by mouth daily. 08/31/20  Yes [provider]  Multiple Vitamins-Minerals (PRESERVISION AREDS 2 PO) Take 1 capsule by mouth 2 (two) times daily.   Yes [provider]   Niacinamide-Zn-Cu-Methfo-Se-Cr (NICOTINAMIDE PO) Take 1 capsule by mouth 2 (two) times daily.   Yes [provider]  nitroGLYCERIN (NITROSTAT) 0.4 MG SL tablet PLACE 1 TABLET UNDER THE TONGUE EVERY 5 MINUTES AS NEEDED FOR CHEST PAIN Patient taking differently: Place 0.4 mg under the tongue every 5 (five) minutes as needed for chest pain. 09/11/19  Yes Jerline Pain, MD  Polyethyl Glycol-Propyl Glycol (SYSTANE OP) Apply 1-2 drops to eye at bedtime as needed (dry eyes).   Yes [provider]  spironolactone-hydrochlorothiazide (ALDACTAZIDE) 25-25 MG per tablet Take 1 tablet by mouth daily.     Yes [provider]  traMADol (ULTRAM) 50 MG tablet Take 50 mg by mouth 2 (two) times daily.   Yes [provider]  azelastine (ASTELIN) 0.1 % nasal  spray Place 1 spray into both nostrils 2 (two) times daily. Use in each nostril as directed Patient not taking: Reported on 12/13/2021 09/20/20   Lauraine Rinne, NP  isosorbide mononitrate (IMDUR) 60 MG 24 hr tablet Take 1.5 tablets (90 mg total) by mouth daily. Patient not taking: Reported on 12/13/2021 11/28/18   Ina Homes, MD  pantoprazole (PROTONIX) 40 MG tablet Take 1 tablet (40 mg total) by mouth 2 (two) times daily. Patient not taking: Reported on 12/13/2021 09/11/19   Rigoberto Noel, MD    Physical Exam: Vitals:   12/13/21 0600 12/13/21 0841 12/13/21 0915 12/13/21 1137  BP: (!) 126/46  102/86 (!) 139/53  Pulse: 63 66 68 73  Resp: 18 18 18 20   Temp:  99.2 F (37.3 C)  98.3 F (36.8 C)  TempSrc:  Oral  Oral  SpO2: 99% 94% 93% 96%  Weight:    65.7 kg  Height:        Constitutional: Elderly female appears acutely ill and intermittently falls sleep during evaluation Eyes: PERRL, lids and conjunctivae normal ENMT: Mucous membranes are moist. Posterior pharynx clear of any exudate or lesions. Neck: normal, supple, no masses, no thyromegaly Respiratory: Rhonchi present on the right lung field.  Normal  respiratory effort currently with O2 saturation maintained on room air. Cardiovascular: Regular rate and rhythm, no murmurs / rubs / gallops. No extremity edema. 2+ pedal pulses. No carotid bruits.  Abdomen: no tenderness. Bowel sounds positive.  Musculoskeletal: no clubbing / cyanosis. No joint deformity upper and lower extremities. Good ROM, no contractures. Normal muscle tone.  Skin: no rashes, lesions, ulcers. No induration Neurologic: CN 2-12 grossly intact. Sensation intact, DTR normal. Strength 5/5 in all 4.  Psychiatric: Mild recent  memory loss. Alert and oriented x 3. Normal mood.    Data Reviewed:  EKG noted sinus rhythm at 93 bpm with a degree heart block  Assessment and Plan: * Acute respiratory failure with hypoxia secondary to sepsis due to pneumonia [facility])- (present on admission) Blood admission into the emergency department febrile up to 104.3 F with tachycardia and tachypnea meeting SIRS criteria.  Blood cultures have been obtained and she had initially been started on empiric antibiotics with vancomycin, cefepime, and metronidazole. Patient was noted to have a right lower lobe consolidation concerning for pneumonia on CT. she was noted to have signs of endorgan damage with O2 saturations initially noted to be as low as 87% for which she was placed on supplemental oxygen. -Admitted to a progressive bed -Continue to monitor O2 saturations.  Nasal cannula oxygen as needed to maintain O2 saturation greater than 92% -Incentive spirometry and flutter valve -Follow-up cultures -Check procalcitonin -De-escalate antibiotics to Rocephin and azithromycin -Mucinex -Tylenol as needed for fever -Speech therapy consulted due to concern for possible aspiration to check modified barium swallow  Leukocytosis- (present on admission) WBC was 11.5 on admission, but repeat check 13.8 today.  Suspect secondary to volume. -Continue to monitor  Essential (primary) hypertension- (present on  admission) Home medication regimen includes losartan 50 mg daily, metoprolol 50 mg daily, isosorbide mononitrate 90 mg daily, and spironolactone-hydrochlorothiazide 25-25 mg daily  Hypothyroidism- (present on admission) Patient's husband notes that the patient has been sleeping more than usual.  Possibly secondary to above versus hypothyroidism uncontrolled. -Check TSH -Continue levothyroxine  Thrombocytopenia ([place])- (present on admission) Chronic.  No signs of bleeding. -Continue to monitor  ILD (interstitial lung disease) ([place])- (present on admission) He has been being followed by Dr. Elsworth Soho  pulmonology in outpatient setting for a chronic cough where high-resolution CT scans of the chest since 2013or noted interstitial changes concerning interstitial pulmonary fibrosis. -Recommend outpatient follow-up with Dr. Elsworth Soho  Chronic kidney disease, stage 3b ([place]) Creatinine was initially 1.3 which appears around patient's baseline.  Repeat check today 1.15. -Continue to monitor.    Advance Care Planning:   Code Status: Full Code   Consults: None  Family Communication: Husband updated at bedside  Severity of Illness: The appropriate patient status for this patient is INPATIENT. Inpatient status is judged to be reasonable and necessary in order to provide the required intensity of service to ensure the patient's safety. The patient's presenting symptoms, physical exam findings, and initial radiographic and laboratory data in the context of their chronic comorbidities is felt to place them at high risk for further clinical deterioration. Furthermore, it is not anticipated that the patient will be medically stable for discharge from the hospital within 2 midnights of admission.   * I certify that at the point of admission it is my clinical judgment that the patient will require inpatient hospital care spanning beyond 2 midnights from the point of admission due to high intensity of service, high  risk for further deterioration and high frequency of surveillance required.*  Author: Norval Morton, MD 12/13/2021 1:45 PM  For on call review [email or web address].

## 2021-12-13 NOTE — Assessment & Plan Note (Deleted)
Stable, continue metoprolol, Imdur, losartan

## 2021-12-13 NOTE — Assessment & Plan Note (Addendum)
Creatinine was initially 1.3 which appears around patient's baseline.  Repeat check today 1.21. -Continue to monitor.

## 2021-12-13 NOTE — TOC Progression Note (Signed)
Transition of Care [facility]) - Progression Note    Patient Details  Name: Cindy [name] MRN: [id number] Date of Birth: October 26, 1931  Transition of [facility]) CM/SW Contact  Zenon Mayo, RN Phone Number: 12/13/2021, 4:12 PM  Clinical Narrative:    From home with confusion, Pna, with fever, started on IV abx, TOC will continue to follow for dc needs.         Expected Discharge Plan and Services                                                 Social Determinants of Health (SDOH) Interventions    Readmission Risk Interventions No flowsheet data found.

## 2021-12-13 NOTE — ED Provider Notes (Addendum)
Spoke with family regarding the admission plan.  Family feels that she is better now.  They are wondering if they could just take her home.  I reviewed the patient's chart and noted the confusion that she had on arrival.  I explained the family that I still would recommend admission to the hospital at this time considering her pneumonia and altered mental status.  Family and patient still wondering about how long they will stay.  Will continue with plan for admission at this time.  Patient's vitals remained stable.  She does not have an oxygen requirement      Dorie Rank, MD 12/13/21 [phone]

## 2021-12-13 NOTE — Plan of Care (Signed)
  Problem: Activity: Goal: Ability to tolerate increased activity will improve Outcome: Progressing   Problem: Clinical Measurements: Goal: Ability to maintain a body temperature in the normal range will improve Outcome: Progressing   Problem: Respiratory: Goal: Ability to maintain adequate ventilation will improve Outcome: Progressing   

## 2021-12-13 NOTE — Assessment & Plan Note (Addendum)
Chronic.  No signs of bleeding. -Platelet count has remained relatively stable and did drop from admission from 142 but is now trended down to and trending back up is 115 at the time of discharge -Continue to monitor

## 2021-12-13 NOTE — Assessment & Plan Note (Addendum)
-  Stable, continue metoprolol, Imdur, losartan, Aldactone  -Will hold HCTZ but can resume at discharge

## 2021-12-14 ENCOUNTER — Inpatient Hospital Stay (HOSPITAL_COMMUNITY): Payer: Medicare (Managed Care)

## 2021-12-14 LAB — BASIC METABOLIC PANEL
Anion gap: 8 (ref 5–15)
BUN: 25 mg/dL — ABNORMAL HIGH (ref 8–23)
CO2: 24 mmol/L (ref 22–32)
Calcium: 8.8 mg/dL — ABNORMAL LOW (ref 8.9–10.3)
Chloride: 102 mmol/L (ref 98–111)
Creatinine, Ser: 1.28 mg/dL — ABNORMAL HIGH (ref 0.44–1.00)
GFR, Estimated: 41 mL/min — ABNORMAL LOW (ref >60)
Glucose, Bld: 83 mg/dL (ref 70–99)
Potassium: 3.9 mmol/L (ref 3.5–5.1)
Sodium: 134 mmol/L — ABNORMAL LOW (ref 135–145)

## 2021-12-14 LAB — PROCALCITONIN: Procalcitonin: 0.63 ng/mL

## 2021-12-14 LAB — CBC
HCT: 29.3 % — ABNORMAL LOW (ref 36.0–46.0)
Hemoglobin: 9.8 g/dL — ABNORMAL LOW (ref 12.0–15.0)
MCH: 32.9 pg (ref 26.0–34.0)
MCHC: 33.4 g/dL (ref 30.0–36.0)
MCV: 98.3 fL (ref 80.0–100.0)
Platelets: 110 10*3/uL — ABNORMAL LOW (ref 150–400)
RBC: 2.98 MIL/uL — ABNORMAL LOW (ref 3.87–5.11)
RDW: 12.2 % (ref 11.5–15.5)
WBC: 10 10*3/uL (ref 4.0–10.5)
nRBC: 0 % (ref 0.0–0.2)

## 2021-12-14 LAB — TSH: TSH: 2.04 u[IU]/mL (ref 0.350–4.500)

## 2021-12-14 MED ORDER — ENOXAPARIN SODIUM 30 MG/0.3ML IJ SOSY
30.0000 mg | PREFILLED_SYRINGE | Freq: Every day | INTRAMUSCULAR | Status: DC
  Administered 2021-12-14: 30 mg via SUBCUTANEOUS
  Filled 2021-12-14: qty 0.3

## 2021-12-14 MED ORDER — SODIUM CHLORIDE 0.9 % IV SOLN
3.0000 g | Freq: Two times a day (BID) | INTRAVENOUS | Status: DC
  Administered 2021-12-14 – 2021-12-15 (×3): 3 g via INTRAVENOUS
  Filled 2021-12-14 (×4): qty 8

## 2021-12-14 NOTE — Assessment & Plan Note (Signed)
Stable, continue aspirin, beta-blocker, statin

## 2021-12-14 NOTE — TOC Progression Note (Signed)
Transition of Care [facility]) - Progression Note    Patient Details  Name: Cindy [name] MRN: [id number] Date of Birth: [birth year]  Transition [facility] Care [facility]) CM/SW Contact  Zenon Mayo, RN Phone Number: 12/14/2021, 2:37 PM  Clinical Narrative:    NCM spoke with patient at bedside, she lives with spouse, who is supportive to her. She has a cane ,walker and walk in shower with seat at home.  She states her spouse will transport her home at discharge. Her with Asp Pna, conts on iv abx, will change to Unasyn today. Speech to eval.  Per pt eval rec outpatient physical therapy, NCM asked is she would like for this to be set up for her she states yes, she would like to go to [facility].  NCM will make referral thru epic for her. TOC will continue to follow for dc needs.    Expected Discharge Plan: Home/Self Care Barriers to Discharge: Continued Medical Work up  Expected Discharge Plan and Services Expected Discharge Plan: Home/Self Care   Discharge Planning Services: CM Consult Post Acute Care Choice: NA Living arrangements for the past 2 months: Single Family Home                   DME Agency: NA       HH Arranged: NA           Social Determinants of Health (SDOH) Interventions    Readmission Risk Interventions No flowsheet data found.

## 2021-12-14 NOTE — Progress Notes (Signed)
Modified Barium Swallow Progress Note  Patient Details  Name: Cindy [name] MRN: [id number] Date of Birth: 01-Jun-1932  Today's Date: 12/14/2021  Modified Barium Swallow completed.  Full report located under Chart Review in the Imaging Section.  Brief recommendations include the following:  Clinical Impression  Pt has a mild pharyngeal dysphagia, characterized primarily by mistimed swallowing and mildly reduced epiglottic inversion. Trace penetration as deep as to the vocal folds and intermittent aspiration occurs with thin liquids. She cannot clear aspirates with reflexive or volitional coughing despite small volume. Aspiration primarily occurs when using a straw and is not eliminated with use of a chin tuck. Pt is only able to tuck her chin a little bit due to reports of neck pain, but even subtle movement that she can tolerate without pain is adequate to increase airway protection when also cued for small, single cup sips. Although penetration still occurs, it is more shallow and mostly clears upon completion of the swallow. Recommend regular solids and thin liquids by cup via small, single sips and use of a chin tuck as tolerated.   Swallow Evaluation Recommendations       SLP Diet Recommendations: Regular solids;Thin liquid   Liquid Administration via: Cup;No straw   Medication Administration: Whole meds with puree   Supervision: Patient able to self feed;Intermittent supervision to cue for compensatory strategies   Compensations: Slow rate;Small sips/bites;Clear throat intermittently;Chin tuck;Other (Comment) (chew food well; one sip at a time)   Postural Changes: Seated upright at 90 degrees   Oral Care Recommendations: Oral care BID        Osie Bond., M.A. [place] Pager [phone] Office [phone]  12/14/2021,12:37 PM

## 2021-12-14 NOTE — Evaluation (Signed)
Physical Therapy Evaluation Patient Details Name: Cindy [name] MRN: [id number] DOB: [birth year] Today's Date: 12/14/2021  History of Present Illness  The pt is an 88 yo female presenting 2/21 with confusion, found to have sepsis due to PNA. PMH includes: asthma, bronchitis, COPD, CAD, HTN, renal insufficiency, and thyroid disease.   Clinical Impression  Pt in bed upon arrival of PT, agreeable to evaluation at this time. Prior to admission the pt was mobilizing short distances in the home without use of DME, but benefits from BUE support on shopping cart for longer distances. The pt reports she is mostly independent with mobility and ADLs at home, but spouse assists as needed. The pt now presents with limitations in functional mobility, endurance, and dynamic stability due to above dx, and will continue to benefit from skilled PT to address these deficits. The pt was able to complete sit-stand transfers without assist, but needed increased UE support initially to steady with gait. She was able to progress to no UE support, but did require increased assist with any balance challenge such as head turns, navigating around objects, and was unable to change speed without minor LOB. The pt's spouse has been pursuing upright walker for this patient, and I agree it would be useful. We also discussed OPPT for continued balance and endurance training to maintain pt's independence and reduce risk of future falls.     Gait Speed: 0.64m/s (Gait speed <0.55m/s indicates increased risk of falls and dependence in ADLs)    Recommendations for follow up therapy are one component of a multi-disciplinary discharge planning process, led by the attending physician.  Recommendations may be updated based on patient status, additional functional criteria and insurance authorization.  Follow Up Recommendations Outpatient PT    Assistance Recommended at Discharge Intermittent Supervision/Assistance  Patient can return home  with the following  A little help with walking and/or transfers;Assistance with cooking/housework;Help with stairs or ramp for entrance    Equipment Recommendations  (upright 4-wheel walker)  Recommendations for Other Services       Functional Status Assessment Patient has had a recent decline in their functional status and demonstrates the ability to make significant improvements in function in a reasonable and predictable amount of time.     Precautions / Restrictions Precautions Precautions: Fall Restrictions Weight Bearing Restrictions: No      Mobility  Bed Mobility Overal bed mobility: Independent                  Transfers Overall transfer level: Needs assistance Equipment used: None Transfers: Sit to/from Stand Sit to Stand: Min guard           General transfer comment: pt able to power up without assist, reaching for UE support to steady once in standing    Ambulation/Gait Ambulation/Gait assistance: Min guard, Min assist Gait Distance (Feet): 150 Feet Assistive device: 1 person hand held assist, None Gait Pattern/deviations: Step-through pattern, Decreased stride length Gait velocity: 0.42 m/s Gait velocity interpretation: <1.8 ft/sec, indicate of risk for recurrent falls   General Gait Details: pt progressed from minA through HHA to minG with no UE support. slow but mostly steady gait, unable to tolerate challenges without additional support and minor LOB. pt reports she is a slow walker to reduce fall risk. VSS with gait     Balance Overall balance assessment: Mild deficits observed, not formally tested, Needs assistance Sitting-balance support: No upper extremity supported Sitting balance-Leahy Scale: Good     Standing balance support: Single  extremity supported, During functional activity Standing balance-Leahy Scale: Fair Standing balance comment: able to ambulate without UE support, unable to tolerate balance challenges                              Pertinent Vitals/Pain Pain Assessment Pain Assessment: No/denies pain    Home Living Family/patient expects to be discharged to:: Private residence Living Arrangements: Spouse/significant other Available Help at Discharge: Family;Available 24 hours/day Type of Home: House Home Access: Level entry       Home Layout: One level Home Equipment: Conservation officer, nature (2 wheels);Cane - single point;Shower seat - built in      Prior Function Prior Level of Function : Independent/Modified Independent;Driving             Mobility Comments: pt reports x1 fall in 6 months, no injuries. not using DME but will push shopping cart at [employer] ADLs Comments: pt reports independence other than needing assist to don bra     Hand Dominance   Dominant Hand: Right    Extremity/Trunk Assessment   Upper Extremity Assessment Upper Extremity Assessment: RUE deficits/detail RUE Deficits / Details: pt reports hx of rotator cuff injury, repair, and re-injury. unable to flex to 90    Lower Extremity Assessment Lower Extremity Assessment: Generalized weakness (grossly functional but poor muscle bulk and endurance)    Cervical / Trunk Assessment Cervical / Trunk Assessment: Kyphotic  Communication   Communication: No difficulties  Cognition Arousal/Alertness: Awake/alert Behavior During Therapy: WFL for tasks assessed/performed Overall Cognitive Status: Within Functional Limits for tasks assessed                                          General Comments General comments (skin integrity, edema, etc.): VSS on RA    Exercises     Assessment/Plan    PT Assessment Patient needs continued PT services  PT Problem List Decreased strength;Decreased activity tolerance;Decreased balance;Decreased mobility;Cardiopulmonary status limiting activity       PT Treatment Interventions DME instruction;Gait training;Stair training;Functional mobility training;Balance  training;Therapeutic exercise;Therapeutic activities;Patient/family education    PT Goals (Current goals can be found in the Care Plan section)  Acute Rehab PT Goals Patient Stated Goal: to ambulate without assist PT Goal Formulation: With patient Time For Goal Achievement: 12/28/21 Potential to Achieve Goals: Good    Frequency Min 3X/week        AM-PAC PT "6 Clicks" Mobility  Outcome Measure Help needed turning from your back to your side while in a flat bed without using bedrails?: None Help needed moving from lying on your back to sitting on the side of a flat bed without using bedrails?: None Help needed moving to and from a bed to a chair (including a wheelchair)?: A Little Help needed standing up from a chair using your arms (e.g., wheelchair or bedside chair)?: A Little Help needed to walk in hospital room?: A Little Help needed climbing 3-5 steps with a railing? : A Little 6 Click Score: 20    End of Session Equipment Utilized During Treatment: Gait belt Activity Tolerance: Patient tolerated treatment well Patient left: in bed;with call bell/phone within reach;with family/visitor present Nurse Communication: Mobility status PT Visit Diagnosis: Other abnormalities of gait and mobility (R26.89);Muscle weakness (generalized) (M62.81)    Time: 0973-5329 PT Time Calculation (min) (ACUTE ONLY): 34 min  Charges:   PT Evaluation $PT Eval Low Complexity: 1 Low PT Treatments $Therapeutic Exercise: 8-22 mins        West Carbo, PT, DPT   Acute Rehabilitation Department Pager #: [phone]  Sandra Cockayne 12/14/2021, 10:09 AM

## 2021-12-14 NOTE — Progress Notes (Signed)
PROGRESS NOTE    CHRYSTINE FROGGE  FWY:[id number] DOB: July 15, 1932 DOA: 12/12/2021 PCP: Lawerance Cruel, MD  Brief Narrative: 89/F with history of CAD, COPD, CKD3a, hypertension, dyslipidemia was brought to the ED with altered mental status cough and chills.  History of chronic cough, worse in the last few weeks, yesterday spouse and son found her disoriented and febrile, brought to the ED. -In the ED temp was 104.3, tachypneic, tachycardic, hypoxic WBC 11.5, creatinine 1.3, lactate 1.1, UA -benign, CT chest noted right base consolidation, concerning for aspiration pneumonia  Subjective: -Feels much better today, breathing improving, has a productive cough   Assessment and Plan:  * Acute respiratory failure with hypoxia ([place])- (present on admission) Aspiration pneumonia Sepsis -poa -Change antibiotics to IV Unasyn today -Check SLP evaluation -Mucinex, incentive spirometry, out of bed to chair -Follow-up blood and sputum cultures -Ambulate, PT eval  Chronic kidney disease, stage 3b (HCC) Creatinine was initially 1.3 which appears around patient's baseline.  Repeat check today 1.15. -Continue to monitor.  Essential (primary) hypertension- (present on admission) -Stable, continue metoprolol, Imdur, losartan, Aldactone  -Will hold HCTZ   Hypothyroidism- (present on admission) -Continue levothyroxine  Thrombocytopenia ([place])- (present on admission) Chronic.  No signs of bleeding. -Continue to monitor  Coronary atherosclerosis of native coronary artery- (present on admission) Stable, continue aspirin, beta-blocker, statin  ILD (interstitial lung disease) ([place])- (present on admission) - followed by Dr. Elsworth Soho pulmonology for a chronic cough where high-resolution CT chest since 2013  noted interstitial changes concerning interstitial pulmonary fibrosis. -Recommend outpatient follow-up with Dr. Elsworth Soho  DVT prophylaxis: Lovenox Code Status: Full code Family Communication: Discussed  with patient in detail, no family at bedside, will update spouse Disposition Plan: Home likely 48 hours  Consultants:    Procedures:   Antimicrobials:    Objective: Vitals:   12/13/21 2312 12/14/21 0207 12/14/21 0425 12/14/21 0727  BP: (!) 133/44  (!) 125/50 (!) 127/41  Pulse: 76  71 64  Resp: 15  19 16   Temp: 98.3 F (36.8 C)  97.8 F (36.6 C) 97.9 F (36.6 C)  TempSrc: Oral  Oral Oral  SpO2: 96%  91% 91%  Weight:  65.8 kg    Height:        Intake/Output Summary (Last 24 hours) at 12/14/2021 0946 Last data filed at 12/14/2021 2774 Gross per 24 hour  Intake 1900 ml  Output 1450 ml  Net 450 ml   Filed Weights   12/12/21 1904 12/13/21 1137 12/14/21 0207  Weight: 73.5 kg 65.7 kg 65.8 kg    Examination:  General exam: Appears calm and comfortable, AAOx3, no distress Respiratory system: Scattered rhonchi and bronchial breath sounds at the right base Cardiovascular system: S1 & S2 heard, RRR.  Abd: nondistended, soft and nontender.Normal bowel sounds heard. Central nervous system: Alert and oriented. No focal neurological deficits. Extremities: no edema Skin: No rashes Psychiatry:  Mood & affect appropriate.     Data Reviewed:   CBC: Recent Labs  Lab 12/12/21 1930 12/13/21 1420 12/14/21 0410  WBC 11.5* 13.8* 10.0  NEUTROABS 9.5* 11.1*  --   HGB 11.8* 11.6* 9.8*  HCT 35.1* 34.9* 29.3*  MCV 98.0 98.0 98.3  PLT 142* 121* 128*   Basic Metabolic Panel: Recent Labs  Lab 12/12/21 1930 12/13/21 1420 12/14/21 0410  NA 135 137 134*  K 4.7 4.0 3.9  CL 100 102 102  CO2 25 25 24   GLUCOSE 115* 84 83  BUN 33* 24* 25*  CREATININE 1.30* 1.15*  1.28*  CALCIUM 10.3 9.9 8.8*   GFR: Estimated Creatinine Clearance: 26.2 mL/min (A) (by C-G formula based on SCr of 1.28 mg/dL (H)). Liver Function Tests: Recent Labs  Lab 12/12/21 1930  AST 21  ALT 10  ALKPHOS 44  BILITOT 0.7  PROT 6.9  ALBUMIN 4.3   No results for input(s): LIPASE, AMYLASE in the last 168  hours. No results for input(s): AMMONIA in the last 168 hours. Coagulation Profile: Recent Labs  Lab 12/12/21 1930  INR 1.1   Cardiac Enzymes: No results for input(s): CKTOTAL, CKMB, CKMBINDEX, TROPONINI in the last 168 hours. BNP (last 3 results) No results for input(s): PROBNP in the last 8760 hours. HbA1C: No results for input(s): HGBA1C in the last 72 hours. CBG: No results for input(s): GLUCAP in the last 168 hours. Lipid Profile: No results for input(s): CHOL, HDL, LDLCALC, TRIG, CHOLHDL, LDLDIRECT in the last 72 hours. Thyroid Function Tests: Recent Labs    12/14/21 0410  TSH 2.040   Anemia Panel: No results for input(s): VITAMINB12, FOLATE, FERRITIN, TIBC, IRON, RETICCTPCT in the last 72 hours. Urine analysis:    Component Value Date/Time   COLORURINE YELLOW 12/12/2021 1945   APPEARANCEUR CLEAR 12/12/2021 1945   LABSPEC 1.014 12/12/2021 1945   PHURINE 7.0 12/12/2021 1945   GLUCOSEU NEGATIVE 12/12/2021 1945   HGBUR NEGATIVE 12/12/2021 1945   [place] NEGATIVE 12/12/2021 1945   KETONESUR NEGATIVE 12/12/2021 1945   PROTEINUR NEGATIVE 12/12/2021 1945   NITRITE NEGATIVE 12/12/2021 1945   LEUKOCYTESUR NEGATIVE 12/12/2021 1945   Sepsis Labs: @LABRCNTIP (procalcitonin:4,lacticidven:4)  ) Recent Results (from the past 240 hour(s))  Culture, blood (Routine x 2)     Status: None (Preliminary result)   Collection Time: 12/12/21  6:45 PM   Specimen: BLOOD  Result Value Ref Range Status   Specimen Description   Final    BLOOD BLOOD RIGHT HAND Performed at [facility], [address] [place], [place] [address]    Special Requests   Final    BOTTLES DRAWN AEROBIC ONLY Blood Culture adequate volume Performed at [facility], [address], [place], [place] [address]    Culture   Final    NO GROWTH 2 DAYS Performed at [facility], [place] [address], [place],  [address]    Report Status PENDING  Incomplete   Resp Panel by RT-PCR (Flu A&B, Covid) Nasopharyngeal Swab     Status: None   Collection Time: 12/12/21  7:15 PM   Specimen: Nasopharyngeal Swab; Nasopharyngeal(NP) swabs in vial transport medium  Result Value Ref Range Status   SARS Coronavirus 2 by RT PCR NEGATIVE NEGATIVE Final    Comment: (NOTE) SARS-CoV-2 target nucleic acids are NOT DETECTED.  The SARS-CoV-2 RNA is generally detectable in upper respiratory specimens during the acute phase of infection. The lowest concentration of SARS-CoV-2 viral copies this assay can detect is 138 copies/mL. A negative result does not preclude SARS-Cov-2 infection and should not be used as the sole basis for treatment or other patient management decisions. A negative result may occur with  improper specimen collection/handling, submission of specimen other than nasopharyngeal swab, presence of viral mutation(s) within the areas targeted by this assay, and inadequate number of viral copies(<138 copies/mL). A negative result must be combined with clinical observations, patient history, and epidemiological information. The expected result is Negative.  Fact Sheet for Patients:  EntrepreneurPulse.com.au  Fact Sheet for Healthcare Providers:  IncredibleEmployment.be  This test is no t yet approved or cleared  by the [place] and  has been authorized for detection and/or diagnosis of SARS-CoV-2 by FDA under an Emergency Use Authorization (EUA). This EUA will remain  in effect (meaning this test can be used) for the duration of the COVID-19 declaration under Section 564(b)(1) of the Act, 21 U.S.C.section 360bbb-3(b)(1), unless the authorization is terminated  or revoked sooner.       Influenza A by PCR NEGATIVE NEGATIVE Final   Influenza B by PCR NEGATIVE NEGATIVE Final    Comment: (NOTE) The Xpert Xpress SARS-CoV-2/FLU/RSV plus assay is intended as an aid in the diagnosis of influenza from  Nasopharyngeal swab specimens and should not be used as a sole basis for treatment. Nasal washings and aspirates are unacceptable for Xpert Xpress SARS-CoV-2/FLU/RSV testing.  Fact Sheet for Patients: EntrepreneurPulse.com.au  Fact Sheet for Healthcare Providers: IncredibleEmployment.be  This test is not yet approved or cleared by the [place] FDA and has been authorized for detection and/or diagnosis of SARS-CoV-2 by FDA under an Emergency Use Authorization (EUA). This EUA will remain in effect (meaning this test can be used) for the duration of the COVID-19 declaration under Section 564(b)(1) of the Act, 21 U.S.C. section 360bbb-3(b)(1), unless the authorization is terminated or revoked.  Performed at [facility], [address] [place], [place] [address]   Culture, blood (Routine x 2)     Status: None (Preliminary result)   Collection Time: 12/12/21  7:30 PM   Specimen: BLOOD  Result Value Ref Range Status   Specimen Description   Final    BLOOD LEFT ANTECUBITAL Performed at [facility], [address] [place], [place] [address]    Special Requests   Final    BOTTLES DRAWN AEROBIC AND ANAEROBIC Blood Culture adequate volume Performed at [facility], [address] [place], [place] [address]    Culture   Final    NO GROWTH 2 DAYS Performed at [facility], [place] [address], [place], [place] [address]    Report Status PENDING  Incomplete     Radiology Studies: CT CHEST ABDOMEN PELVIS W CONTRAST  Result Date: 12/12/2021 CLINICAL DATA:  Sepsis Fever, cough, neg CXR, eval source EXAM: CT CHEST, ABDOMEN, AND PELVIS WITH CONTRAST TECHNIQUE: Multidetector CT imaging of the chest, abdomen and pelvis was performed following the standard protocol during bolus administration of intravenous contrast. RADIATION DOSE REDUCTION: This exam was performed according to the  departmental dose-optimization program which includes automated exposure control, adjustment of the mA and/or kV according to patient size and/or use of iterative reconstruction technique. CONTRAST:  93mL OMNIPAQUE IOHEXOL 300 MG/ML  SOLN COMPARISON:  None. FINDINGS: CT CHEST FINDINGS Cardiovascular: Normal heart size. No significant pericardial effusion. The thoracic aorta is normal in caliber. Moderate to severe atherosclerotic plaque of the thoracic aorta. Four-vessel coronary artery calcifications status post coronary artery bypass graft and likely stent. The pulmonary artery is normal in caliber with no central or proximal segmental pulmonary embolus. Mediastinum/Nodes: No enlarged mediastinal, hilar, or axillary lymph nodes. Thyroid gland, trachea, and esophagus demonstrate no significant findings. Lungs/Pleura: Limited evaluation due to respiratory motion artifact. Trace paraseptal emphysematous changes.Right base slightly heterogeneous hypodense consolidation posteriorly. No pulmonary nodule. No pulmonary mass. No pleural effusion. No pneumothorax. Musculoskeletal: No chest wall abnormality. Diffusely decreased bone density. No suspicious lytic or blastic osseous lesions. Mild retrolisthesis of T12 on L1 with associated partial fusion of the vertebral bodies. No acute displaced fracture. Severe right glenohumeral degenerative changes that are partially visualized.  At least mild to moderate degenerative changes of the left shoulder. CT ABDOMEN PELVIS FINDINGS Hepatobiliary: No focal liver abnormality. No gallstones, gallbladder wall thickening, or pericholecystic fluid. No biliary dilatation. Pancreas: Diffusely atrophic. No focal lesion. Otherwise normal pancreatic contour. No surrounding inflammatory changes. No main pancreatic ductal dilatation. Spleen: Normal in size without focal abnormality. Adrenals/Urinary Tract: No adrenal nodule bilaterally. Bilateral kidneys enhance symmetrically. Subcentimeter  hypodensities are too small to characterize. No hydronephrosis. No hydroureter. Calcifications associated with the right kidney likely vascular. No definite nephrolithiasis. The urinary bladder is unremarkable. Stomach/Bowel: Slightly limited evaluation due to motion artifact. Stomach is within normal limits. No evidence of bowel wall thickening or dilatation. Status post appendectomy. Vascular/Lymphatic: No abdominal aorta or iliac aneurysm. Severe atherosclerotic plaque of the aorta and its branches. No abdominal, pelvic, or inguinal lymphadenopathy. Reproductive: Status post hysterectomy. No adnexal masses. Other: No intraperitoneal free fluid. No intraperitoneal free gas. No organized fluid collection. Musculoskeletal: Small fat containing umbilical hernia with an abdominal defect of 7 mm and associated spat stranding of the associated mesentery. Diffusely decreased bone density. No suspicious lytic or blastic osseous lesions. No acute displaced fracture. Grade 1 anterolisthesis of L5 on S1. Osseous neural foraminal stenosis at this level, left greater than right. Multilevel degenerative changes of the spine. IMPRESSION: 1. Right base slightly heterogeneous hypodense consolidation posteriorly may represent developing infection/inflammation. 2. Small fat containing umbilical hernia with an abdominal defect of 7 mm and associated spat stranding of the associated mesentery. Correlate clinically for incarceration. No associated bowel obstruction. 3. No acute intra-abdominal or intrapelvic abnormality. 4. Aortic Atherosclerosis (ICD10-I70.0) including four-vessel coronary calcifications status post coronary artery bypass graft. 5.  Emphysema (ICD10-J43.9). Electronically Signed   By: Iven Finn M.D.   On: 12/12/2021 21:13   DG Chest Portable 1 View  Result Date: 12/12/2021 CLINICAL DATA:  Possible sepsis EXAM: PORTABLE CHEST 1 VIEW COMPARISON:  11/26/2018 FINDINGS: Cardiac shadow is stable. Postsurgical  changes are again seen. Aortic calcifications are noted. The lungs are well aerated bilaterally. No focal infiltrate or effusion is seen. No acute bony abnormality is noted. IMPRESSION: No active disease. Electronically Signed   By: Inez Catalina M.D.   On: 12/12/2021 19:27     Scheduled Meds:  aspirin  81 mg Oral Daily   atorvastatin  80 mg Oral Daily   enoxaparin (LOVENOX) injection  30 mg Subcutaneous QHS   guaiFENesin  600 mg Oral BID   spironolactone  25 mg Oral Daily   And   hydrochlorothiazide  25 mg Oral Daily   ipratropium  2 spray Each Nare BID   isosorbide mononitrate  90 mg Oral q morning   levothyroxine  175 mcg Oral QPM   loratadine  10 mg Oral Daily   losartan  50 mg Oral Daily   metoprolol succinate  50 mg Oral Daily   montelukast  10 mg Oral Daily   traMADol  50 mg Oral BID   Continuous Infusions:  ampicillin-sulbactam (UNASYN) IV 3 g (12/14/21 0837)     LOS: 1 day    Time spent: 77min    Domenic Polite, MD [facility]   12/14/2021, 9:46 AM

## 2021-12-14 NOTE — Assessment & Plan Note (Addendum)
Aspiration pneumonia Sepsis -poa -Change antibiotics to IV Unasyn yesterday and will continue and change to p.o. Augmentin -Check SLP evaluation and they did MBS and she is recommended for regular diet with thin liquids -Mucinex, incentive spirometry, out of bed to chair -Follow-up blood and sputum cultures -Ambulate, PT eval -PT OT recommending outpatient PT -Procalcitonin: 0.63 -Sepsis physiology has resolved -Repeat chest x-ray in 3 to 6 weeks and follow-up with pulm in outpatient setting

## 2021-12-14 NOTE — Evaluation (Signed)
Clinical/Bedside Swallow Evaluation Patient Details  Name: Cindy [name] MRN: [id number] Date of Birth: 29-Jan-1932  Today's Date: 12/14/2021 Time: SLP Start Time (ACUTE ONLY): 6433 SLP Stop Time (ACUTE ONLY): 0857 SLP Time Calculation (min) (ACUTE ONLY): 21 min  Past Medical History:  Past Medical History:  Diagnosis Date   Asthma    Bronchitis    COPD (chronic obstructive pulmonary disease) ([place])    Coronary artery disease    MI age 88   GERD (gastroesophageal reflux disease)    Heart attack ([place])    Hypercholesteremia    Hypertension    Iron deficiency anemia    used to see hem in [place], [place].    Renal insufficiency    Thyroid disease    Past Surgical History:  Past Surgical History:  Procedure Laterality Date   APPENDECTOMY     BREAST BIOPSY Right    benign   CORONARY ANGIOPLASTY WITH STENT PLACEMENT     CORONARY ARTERY BYPASS GRAFT  09/1979   LEFT HEART CATH AND CORS/GRAFTS ANGIOGRAPHY N/A 11/27/2018   Procedure: LEFT HEART CATH AND CORS/GRAFTS ANGIOGRAPHY;  Surgeon: Lorretta Harp, MD;  Location: [facility];  Service: Cardiovascular;  Laterality: N/A;   Moh's Surgeries     for squamous cell carcinoma   PARTIAL HYSTERECTOMY  10/1978   HPI:  Pt is an 88 yo female presenting with AMS, admitted with sepsis and hypoxia due to PNA. Family reported to MD that pt coughs with PO intake, and they feel it could be related to not chewing her food enough. PMH includes: COPD, GERD, thyroid disease, asthma, CAD, HTN, heart attack    Assessment / Plan / Recommendation  Clinical Impression  Pt reports coughing with PO intake that she primarily notes when she is not masticating her food adequately. SLP noticed wet vocal quality intermittently during consumption of regular soids and thin liquids, which pt attributes to talking while eating. A cued throat clear seems to clear her vocal quality. Pt is agreeable to participating in [place] as was requested by MD. This is scheduled for  this morning. In the meantime, will leave current diet in place but encouraged pt to take small bites, focus on chewing her food completely, and clear her throat whenever she notes a change in her vocal quality. SLP Visit Diagnosis: Dysphagia, unspecified (R13.10)    Aspiration Risk  Mild aspiration risk    Diet Recommendation Regular;Thin liquid   Liquid Administration via: Cup;Straw Medication Administration: Whole meds with liquid Supervision: Patient able to self feed;Intermittent supervision to cue for compensatory strategies Compensations: Minimize environmental distractions;Slow rate;Small sips/bites;Clear throat intermittently;Other (Comment) (chew food well) Postural Changes: Seated upright at 90 degrees    Other  Recommendations Oral Care Recommendations: Oral care BID    Recommendations for follow up therapy are one component of a multi-disciplinary discharge planning process, led by the attending physician.  Recommendations may be updated based on patient status, additional functional criteria and insurance authorization.  Follow up Recommendations  (tba)      Assistance Recommended at Discharge    Functional Status Assessment    Frequency and Duration            Prognosis        Swallow Study   General HPI: Pt is an 88 yo female presenting with AMS, admitted with sepsis and hypoxia due to PNA. Family reported to MD that pt coughs with PO intake, and they feel it could be related to not chewing her  food enough. PMH includes: COPD, GERD, thyroid disease, asthma, CAD, HTN, heart attack Type of Study: Bedside Swallow Evaluation Previous Swallow Assessment: none in chart Diet Prior to this Study: Regular;Thin liquids Temperature Spikes Noted: No Respiratory Status: Room air History of Recent Intubation: No Behavior/Cognition: Alert;Cooperative;Pleasant mood Oral Cavity Assessment: Within Functional Limits Oral Care Completed by SLP: No Oral Cavity - Dentition:  Adequate natural dentition Vision: Functional for self-feeding Self-Feeding Abilities: Able to feed self Patient Positioning: Upright in chair Baseline Vocal Quality: Normal Volitional Cough: Strong;Congested Volitional Swallow: Able to elicit    Oral/Motor/Sensory Function Overall Oral Motor/Sensory Function: Within functional limits   Ice Chips Ice chips: Not tested   Thin Liquid Thin Liquid: Impaired Presentation: Self Fed;Cup;Straw Pharyngeal  Phase Impairments: Wet Vocal Quality    Nectar Thick Nectar Thick Liquid: Not tested   Honey Thick Honey Thick Liquid: Not tested   Puree Puree: Not tested   Solid     Solid: Impaired Presentation: Self Fed Pharyngeal Phase Impairments: Wet Vocal Quality      Osie Bond., M.A. [place] Pager [phone] Office [phone]  12/14/2021,9:04 AM

## 2021-12-14 NOTE — Progress Notes (Signed)
Pharmacy Antibiotic Note  Cindy [name] is a 88 y.o. female  with concern of aspiration PNA on cefepime and vancomycin.  Antibiotics to change to Unasyn -CrCl ~ 25 -cultures- ngtd   Plan: -Unasyn 3gm IV q12h -Will follow renal function, cultures and clinical progress   Height: 5' (152.4 cm) Weight: 65.8 kg (145 lb) IBW/kg (Calculated) : 45.5  Temp (24hrs), Avg:98.3 F (36.8 C), Min:97.8 F (36.6 C), Max:99.2 F (37.3 C)  Recent Labs  Lab 12/12/21 1930 12/13/21 1420 12/14/21 0410  WBC 11.5* 13.8* 10.0  CREATININE 1.30* 1.15* 1.28*  LATICACIDVEN 1.1  --   --      Estimated Creatinine Clearance: 26.2 mL/min (A) (by C-G formula based on SCr of 1.28 mg/dL (H)).    Allergies  Allergen Reactions   Codeine Other (See Comments)    Headache from heavy doses of codeine only   Antimicrobials this admission: cefepime 2/21 >> 2/23 Metronidazole x 1 2/21  vancomycin 2/21 >> 2/23 Unasyn 2/23>>  Microbiology results: 2/21 blood x2- ngtd  Thank you for allowing pharmacy to be a part of this patients care.  Hildred Laser, PharmD Clinical Pharmacist **Pharmacist phone directory can now be found on [email or web address] ([email or web address]).  Listed under [place].

## 2021-12-15 ENCOUNTER — Other Ambulatory Visit (HOSPITAL_COMMUNITY): Payer: Self-pay

## 2021-12-15 DIAGNOSIS — G9341 Metabolic encephalopathy: Secondary | ICD-10-CM

## 2021-12-15 LAB — BASIC METABOLIC PANEL
Anion gap: 9 (ref 5–15)
BUN: 25 mg/dL — ABNORMAL HIGH (ref 8–23)
CO2: 23 mmol/L (ref 22–32)
Calcium: 8.2 mg/dL — ABNORMAL LOW (ref 8.9–10.3)
Chloride: 102 mmol/L (ref 98–111)
Creatinine, Ser: 1.21 mg/dL — ABNORMAL HIGH (ref 0.44–1.00)
GFR, Estimated: 43 mL/min — ABNORMAL LOW (ref >60)
Glucose, Bld: 87 mg/dL (ref 70–99)
Potassium: 3.8 mmol/L (ref 3.5–5.1)
Sodium: 134 mmol/L — ABNORMAL LOW (ref 135–145)

## 2021-12-15 LAB — CBC
HCT: 28.4 % — ABNORMAL LOW (ref 36.0–46.0)
Hemoglobin: 9.6 g/dL — ABNORMAL LOW (ref 12.0–15.0)
MCH: 33.3 pg (ref 26.0–34.0)
MCHC: 33.8 g/dL (ref 30.0–36.0)
MCV: 98.6 fL (ref 80.0–100.0)
Platelets: 115 10*3/uL — ABNORMAL LOW (ref 150–400)
RBC: 2.88 MIL/uL — ABNORMAL LOW (ref 3.87–5.11)
RDW: 12.1 % (ref 11.5–15.5)
WBC: 6.8 10*3/uL (ref 4.0–10.5)
nRBC: 0 % (ref 0.0–0.2)

## 2021-12-15 MED ORDER — ACETAMINOPHEN 325 MG PO TABS
650.0000 mg | ORAL_TABLET | Freq: Four times a day (QID) | ORAL | 0 refills | Status: DC | PRN
  Filled 2021-12-15: qty 20, 3d supply, fill #0

## 2021-12-15 MED ORDER — GUAIFENESIN ER 600 MG PO TB12
1200.0000 mg | ORAL_TABLET | Freq: Two times a day (BID) | ORAL | 0 refills | Status: AC
  Filled 2021-12-15: qty 20, 5d supply, fill #0

## 2021-12-15 MED ORDER — ONDANSETRON HCL 4 MG PO TABS
4.0000 mg | ORAL_TABLET | Freq: Four times a day (QID) | ORAL | 0 refills | Status: DC | PRN
  Filled 2021-12-15: qty 20, 5d supply, fill #0

## 2021-12-15 MED ORDER — AMOXICILLIN-POT CLAVULANATE 500-125 MG PO TABS
1.0000 | ORAL_TABLET | Freq: Two times a day (BID) | ORAL | 0 refills | Status: AC
  Filled 2021-12-15: qty 12, 6d supply, fill #0

## 2021-12-15 MED ORDER — GUAIFENESIN ER 600 MG PO TB12: 1200.0000 mg | ORAL_TABLET | Freq: Two times a day (BID) | ORAL | Status: DC

## 2021-12-15 MED ORDER — SPIRONOLACTONE 25 MG PO TABS
25.0000 mg | ORAL_TABLET | Freq: Every day | ORAL | 0 refills | Status: DC
  Filled 2021-12-15: qty 30, 30d supply, fill #0

## 2021-12-15 NOTE — Discharge Summary (Signed)
Physician Discharge Summary   Patient: Cindy [name] MRN: [id number] DOB: 11/26/1931  Admit date:     12/12/2021  Discharge date: 12/15/21  Discharge Physician: Raiford Noble, DO   PCP: Lawerance Cruel, MD   Recommendations at discharge:   Follow up with PCP within 1-2 weeks Follow up with Pulmonary Dr. Elsworth Soho within 1-2 weeks and repeat chest x-ray in 3 to 6 weeks  Discharge Diagnoses: Principal Problem:   Acute respiratory failure with hypoxia ([place]) Active Problems:   Chronic kidney disease, stage 3b (HCC)   ILD (interstitial lung disease) ([place])   Coronary atherosclerosis of native coronary artery   Sepsis ([place])   Thrombocytopenia ([place])   Hypothyroidism   Essential (primary) hypertension   Acute respiratory failure with hypoxia secondary to sepsis due to pneumonia ([place])   Leukocytosis   Septic encephalopathy  Resolved Problems:   * No resolved hospital problems. [facility] Course: The patient is an 88 year old elderly Caucasian female with a past medical history significant for but limited to CAD, COPD, CKD stage IIIa, hypertension, dyslipidemia as well as other comorbidities was brought to the ED with altered mental status, cough, chills.  She has a history of chronic cough but is worse in the last few weeks and yesterday her spouse and son found her disoriented and febrile and she was brought to the ED.  In the ED her temperature was 104.3 and she was tachypneic, tachycardic as well as hypoxic and had a WBC of 11.5.  Creatinine was 1.3 and lactic acid was 1.1.  UA was obtained and she had a CT of the chest done that was noted to have a right base consolidation which was concerning for aspiration pneumonia.  Started on antibiotics and antibiotics were changed to IV Unasyn.  Her encephalopathy improved significantly and she felt much better and is back to baseline.  She ambulated without issues and did not desaturate.  She is deemed medically stable after PT evaluation and they  recommended outpatient PT and OT.  She will need to repeat her chest x-ray in 3 to 6 weeks and continue antibiotic course duration.  Assessment and Plan: * Acute respiratory failure with hypoxia ([place])- (present on admission) Aspiration pneumonia Sepsis -poa -Change antibiotics to IV Unasyn yesterday and will continue and change to p.o. Augmentin -Check SLP evaluation and they did MBS and she is recommended for regular diet with thin liquids -Mucinex, incentive spirometry, out of bed to chair -Follow-up blood and sputum cultures -Ambulate, PT eval -PT OT recommending outpatient PT -Procalcitonin: 0.63 -Sepsis physiology has resolved -Repeat chest x-ray in 3 to 6 weeks and follow-up with pulm in outpatient setting  Septic encephalopathy -Resolved. She is back to baseline  Essential (primary) hypertension- (present on admission) -Stable, continue metoprolol, Imdur, losartan, Aldactone  -Will hold HCTZ but can resume at discharge  Hypothyroidism- (present on admission) -Continue levothyroxine  Thrombocytopenia ([place])- (present on admission) Chronic.  No signs of bleeding. -Platelet count has remained relatively stable and did drop from admission from 142 but is now trended down to and trending back up is 115 at the time of discharge -Continue to monitor  Sepsis ([place])- (present on admission) - See above  Coronary atherosclerosis of native coronary artery- (present on admission) Stable, continue aspirin, beta-blocker, statin  ILD (interstitial lung disease) ([place])- (present on admission) - followed by Dr. Elsworth Soho pulmonology for a chronic cough where high-resolution CT chest since 2013  noted interstitial changes concerning interstitial pulmonary fibrosis. -Recommend outpatient follow-up  with Dr. Elsworth Soho at discharge  Chronic kidney disease, stage 3b ([place]) Creatinine was initially 1.3 which appears around patient's baseline.  Repeat check today 1.21. -Continue to monitor.  Pain control  - [employer] Controlled Substance Reporting System database was reviewed. and patient was instructed, not to drive, operate heavy machinery, perform activities at heights, swimming or participation in water activities or provide baby-sitting services while on Pain, Sleep and Anxiety Medications; until their outpatient Physician has advised to do so again. Also recommended to not to take more than prescribed Pain, Sleep and Anxiety Medications.   Consultants: None Procedures performed: X-Ray  Disposition: Home Diet recommendation:  Discharge Diet Orders (From admission, onward)     Start     Ordered   12/15/21 0000  Diet - low sodium heart healthy        12/15/21 1252           Cardiac diet  DISCHARGE MEDICATION: Allergies as of 12/15/2021       Reactions   Codeine Other (See Comments)   Headache from heavy doses of codeine only        Medication List     STOP taking these medications    guaifenesin 400 MG Tabs tablet Commonly known as: HUMIBID E Replaced by: SM Mucus Relief 600 MG 12 hr tablet   pantoprazole 40 MG tablet Commonly known as: PROTONIX   spironolactone-hydrochlorothiazide 25-25 MG tablet Commonly known as: ALDACTAZIDE       TAKE these medications    acetaminophen 325 MG tablet Commonly known as: TYLENOL Take 2 tablets (650 mg total) by mouth every 6 (six) hours as needed for mild pain (or Fever >/= 101).   albuterol 108 (90 Base) MCG/ACT inhaler Commonly known as: VENTOLIN HFA Inhale 1 puff into the lungs every 4 (four) hours as needed for wheezing or shortness of breath.   amoxicillin-clavulanate 500-125 MG tablet Commonly known as: Augmentin Take 1 tablet (500 mg total) by mouth in the morning and at bedtime for 6 days.   aspirin 81 MG tablet Take 81 mg by mouth daily.   atorvastatin 80 MG tablet Commonly known as: LIPITOR Take 1 tablet (80 mg total) by mouth daily.   benzonatate 200 MG capsule Commonly known as: TESSALON Take 1  capsule by mouth twice daily as needed for cough What changed:  how much to take how to take this when to take this additional instructions   Calcium Carbonate-Vitamin D 600-200 MG-UNIT Tabs Take 1 tablet by mouth 2 (two) times daily.   ferrous sulfate 325 (65 FE) MG tablet Take 325 mg by mouth daily with breakfast.   fexofenadine 180 MG tablet Commonly known as: ALLEGRA Take 180 mg by mouth daily.   ipratropium 0.06 % nasal spray Commonly known as: ATROVENT Place 2 sprays into both nostrils 2 (two) times daily.   isosorbide mononitrate 30 MG 24 hr tablet Commonly known as: IMDUR Take 90 mg by mouth every morning.   levothyroxine 175 MCG tablet Commonly known as: SYNTHROID Take 175 mcg by mouth every evening.   losartan 50 MG tablet Commonly known as: COZAAR Take 50 mg by mouth daily.   metoprolol succinate 50 MG 24 hr tablet Commonly known as: TOPROL-XL Take 50 mg by mouth daily.   montelukast 10 MG tablet Commonly known as: SINGULAIR Take 10 mg by mouth daily.   NICOTINAMIDE PO Take 1 capsule by mouth 2 (two) times daily.   nitroGLYCERIN 0.4 MG SL tablet Commonly known as: NITROSTAT  PLACE 1 TABLET UNDER THE TONGUE EVERY 5 MINUTES AS NEEDED FOR CHEST PAIN What changed: See the new instructions.   ondansetron 4 MG tablet Commonly known as: ZOFRAN Take 1 tablet (4 mg total) by mouth every 6 (six) hours as needed for nausea.   PRESERVISION AREDS 2 PO Take 1 capsule by mouth 2 (two) times daily.   SM Mucus Relief 600 MG 12 hr tablet Generic drug: guaiFENesin Take 2 tablets (1,200 mg total) by mouth 2 (two) times daily for 5 days. Replaces: guaifenesin 400 MG Tabs tablet   spironolactone 25 MG tablet Commonly known as: ALDACTONE Take 1 tablet (25 mg total) by mouth daily.   SYSTANE OP Apply 1-2 drops to eye at bedtime as needed (dry eyes).   traMADol 50 MG tablet Commonly known as: ULTRAM Take 50 mg by mouth 2 (two) times daily.        Follow-up  Information     [facility] Follow up.   Specialty: Rehabilitation Why: OP rehab for physical therapy- They will contact you , if you do not hear from them in 3 business days please give them a call to set up your apt. Contact information: [address] [id number] mc [place]        Lawerance Cruel, MD. Go on 12/18/2021.   Specialty: Family Medicine Why: @11 :Max Sane information: [place] [address] [phone]         Jerline Pain, MD .   Specialty: Cardiology Contact information: [address] [address] [phone]                 Discharge Exam: Danley Danker Weights   12/13/21 1137 12/14/21 0207 12/15/21 0209  Weight: 65.7 kg 65.8 kg 66.6 kg   Vitals:   12/15/21 0613 12/15/21 1326  BP: (!) 131/41 (!) 142/45  Pulse: 62 64  Resp: 16 20  Temp: 97.9 F (36.6 C) 98.1 F (36.7 C)  SpO2: 95% 97%   Examination: Physical Exam:  Constitutional: Elderly overweight Caucasian female currently in no acute distress appears calm Respiratory: Diminished to auscultation bilaterally, no wheezing, rales, rhonchi or crackles. Normal respiratory effort and patient is not tachypenic. No accessory muscle use.  Unlabored breathing and not wearing supplemental oxygen nasal cannula Cardiovascular: RRR, no murmurs / rubs / gallops. S1 and S2 auscultated.  Trace extremity edema Abdomen: Soft, non-tender, distended second by habitus.  Bowel sounds positive.  Neurologic: CN 2-12 grossly intact with no focal deficits.   Condition at discharge: stable  The results of significant diagnostics from this hospitalization (including imaging, microbiology, ancillary and laboratory) are listed below for reference.   Imaging Studies: CT CHEST ABDOMEN PELVIS W CONTRAST  Result Date: 12/12/2021 CLINICAL DATA:  Sepsis Fever, cough, neg CXR, eval source  EXAM: CT CHEST, ABDOMEN, AND PELVIS WITH CONTRAST TECHNIQUE: Multidetector CT imaging of the chest, abdomen and pelvis was performed following the standard protocol during bolus administration of intravenous contrast. RADIATION DOSE REDUCTION: This exam was performed according to the departmental dose-optimization program which includes automated exposure control, adjustment of the mA and/or kV according to patient size and/or use of iterative reconstruction technique. CONTRAST:  70mL OMNIPAQUE IOHEXOL 300 MG/ML  SOLN COMPARISON:  None. FINDINGS: CT CHEST FINDINGS Cardiovascular: Normal heart size. No significant pericardial effusion. The thoracic aorta is normal in caliber. Moderate to severe atherosclerotic plaque of the thoracic aorta. Four-vessel coronary artery calcifications status post coronary artery  bypass graft and likely stent. The pulmonary artery is normal in caliber with no central or proximal segmental pulmonary embolus. Mediastinum/Nodes: No enlarged mediastinal, hilar, or axillary lymph nodes. Thyroid gland, trachea, and esophagus demonstrate no significant findings. Lungs/Pleura: Limited evaluation due to respiratory motion artifact. Trace paraseptal emphysematous changes.Right base slightly heterogeneous hypodense consolidation posteriorly. No pulmonary nodule. No pulmonary mass. No pleural effusion. No pneumothorax. Musculoskeletal: No chest wall abnormality. Diffusely decreased bone density. No suspicious lytic or blastic osseous lesions. Mild retrolisthesis of T12 on L1 with associated partial fusion of the vertebral bodies. No acute displaced fracture. Severe right glenohumeral degenerative changes that are partially visualized. At least mild to moderate degenerative changes of the left shoulder. CT ABDOMEN PELVIS FINDINGS Hepatobiliary: No focal liver abnormality. No gallstones, gallbladder wall thickening, or pericholecystic fluid. No biliary dilatation. Pancreas: Diffusely atrophic. No  focal lesion. Otherwise normal pancreatic contour. No surrounding inflammatory changes. No main pancreatic ductal dilatation. Spleen: Normal in size without focal abnormality. Adrenals/Urinary Tract: No adrenal nodule bilaterally. Bilateral kidneys enhance symmetrically. Subcentimeter hypodensities are too small to characterize. No hydronephrosis. No hydroureter. Calcifications associated with the right kidney likely vascular. No definite nephrolithiasis. The urinary bladder is unremarkable. Stomach/Bowel: Slightly limited evaluation due to motion artifact. Stomach is within normal limits. No evidence of bowel wall thickening or dilatation. Status post appendectomy. Vascular/Lymphatic: No abdominal aorta or iliac aneurysm. Severe atherosclerotic plaque of the aorta and its branches. No abdominal, pelvic, or inguinal lymphadenopathy. Reproductive: Status post hysterectomy. No adnexal masses. Other: No intraperitoneal free fluid. No intraperitoneal free gas. No organized fluid collection. Musculoskeletal: Small fat containing umbilical hernia with an abdominal defect of 7 mm and associated spat stranding of the associated mesentery. Diffusely decreased bone density. No suspicious lytic or blastic osseous lesions. No acute displaced fracture. Grade 1 anterolisthesis of L5 on S1. Osseous neural foraminal stenosis at this level, left greater than right. Multilevel degenerative changes of the spine. IMPRESSION: 1. Right base slightly heterogeneous hypodense consolidation posteriorly may represent developing infection/inflammation. 2. Small fat containing umbilical hernia with an abdominal defect of 7 mm and associated spat stranding of the associated mesentery. Correlate clinically for incarceration. No associated bowel obstruction. 3. No acute intra-abdominal or intrapelvic abnormality. 4. Aortic Atherosclerosis (ICD10-I70.0) including four-vessel coronary calcifications status post coronary artery bypass graft. 5.   Emphysema (ICD10-J43.9). Electronically Signed   By: Iven Finn M.D.   On: 12/12/2021 21:13   DG Chest Portable 1 View  Result Date: 12/12/2021 CLINICAL DATA:  Possible sepsis EXAM: PORTABLE CHEST 1 VIEW COMPARISON:  11/26/2018 FINDINGS: Cardiac shadow is stable. Postsurgical changes are again seen. Aortic calcifications are noted. The lungs are well aerated bilaterally. No focal infiltrate or effusion is seen. No acute bony abnormality is noted. IMPRESSION: No active disease. Electronically Signed   By: Inez Catalina M.D.   On: 12/12/2021 19:27   DG Swallowing Func-Speech Pathology  Result Date: 12/14/2021 Table formatting from the original result was not included. Objective Swallowing Evaluation: Type of Study: MBS-Modified Barium Swallow Study  Patient Details Name: MARAKI MACQUARRIE MRN: [id number] Date of Birth: November 26, 1931 Today's Date: 12/14/2021 Time: SLP Start Time (ACUTE ONLY): 53 -SLP Stop Time (ACUTE ONLY): 1047 SLP Time Calculation (min) (ACUTE ONLY): 19 min Past Medical History: Past Medical History: Diagnosis Date  Asthma   Bronchitis   COPD (chronic obstructive pulmonary disease) ([place])   Coronary artery disease   MI age 38  GERD (gastroesophageal reflux disease)   Heart attack ([place])   Hypercholesteremia  Hypertension   Iron deficiency anemia   used to see hem in [place], [place].   Renal insufficiency   Thyroid disease  Past Surgical History: Past Surgical History: Procedure Laterality Date  APPENDECTOMY    BREAST BIOPSY Right   benign  CORONARY ANGIOPLASTY WITH STENT PLACEMENT    CORONARY ARTERY BYPASS GRAFT  09/1979  LEFT HEART CATH AND CORS/GRAFTS ANGIOGRAPHY N/A 11/27/2018  Procedure: LEFT HEART CATH AND CORS/GRAFTS ANGIOGRAPHY;  Surgeon: Lorretta Harp, MD;  Location: [facility];  Service: Cardiovascular;  Laterality: N/A;  Moh's Surgeries    for squamous cell carcinoma  PARTIAL HYSTERECTOMY  10/1978 HPI: Pt is an 88 yo female presenting with AMS, admitted with sepsis and hypoxia due  to PNA. Family reported to MD that pt coughs with PO intake, and they feel it could be related to not chewing her food enough. PMH includes: COPD, GERD, thyroid disease, asthma, CAD, HTN, heart attack  Subjective: thinks she coughs with PO intake mostly when she doesn't chew her food all the way, like when she's in a rush  Recommendations for follow up therapy are one component of a multi-disciplinary discharge planning process, led by the attending physician.  Recommendations may be updated based on patient status, additional functional criteria and insurance authorization. Assessment / Plan / Recommendation Clinical Impressions 12/14/2021 Clinical Impression Pt has a mild pharyngeal dysphagia, characterized primarily by mistimed swallowing and mildly reduced epiglottic inversion. Trace penetration as deep as to the vocal folds and intermittent aspiration occurs with thin liquids. She cannot clear aspirates with reflexive or volitional coughing despite small volume. Aspiration primarily occurs when using a straw and is not eliminated with use of a chin tuck. Pt is only able to tuck her chin a little bit due to reports of neck pain, but even subtle movement that she can tolerate without pain is adequate to increase airway protection when also cued for small, single cup sips. Although penetration still occurs, it is more shallow and mostly clears upon completion of the swallow. Recommend regular solids and thin liquids by cup via small, single sips and use of a chin tuck as tolerated. SLP Visit Diagnosis Dysphagia, pharyngeal phase (R13.13) Attention and concentration deficit following -- Frontal lobe and executive function deficit following -- Impact on safety and function Mild aspiration risk   Treatment Recommendations 12/14/2021 Treatment Recommendations Therapy as outlined in treatment plan below   Prognosis 12/14/2021 Prognosis for Safe Diet Advancement Good Barriers to Reach Goals -- Barriers/Prognosis Comment --  Diet Recommendations 12/14/2021 SLP Diet Recommendations Regular solids;Thin liquid Liquid Administration via Cup;No straw Medication Administration Whole meds with puree Compensations Slow rate;Small sips/bites;Clear throat intermittently;Chin tuck;Other (Comment) Postural Changes Seated upright at 90 degrees   Other Recommendations 12/14/2021 Recommended Consults -- Oral Care Recommendations Oral care BID Other Recommendations -- Follow Up Recommendations Outpatient SLP Assistance recommended at discharge Intermittent Supervision/Assistance Functional Status Assessment Patient has had a recent decline in their functional status and demonstrates the ability to make significant improvements in function in a reasonable and predictable amount of time. Frequency and Duration  12/14/2021 Speech Therapy Frequency (ACUTE ONLY) min 2x/week Treatment Duration 2 weeks   Oral Phase 12/14/2021 Oral Phase WFL Oral - Pudding Teaspoon -- Oral - Pudding Cup -- Oral - Honey Teaspoon -- Oral - Honey Cup -- Oral - Nectar Teaspoon -- Oral - Nectar Cup -- Oral - Nectar Straw -- Oral - Thin Teaspoon -- Oral - Thin Cup -- Oral - Thin Straw -- Oral -  Puree -- Oral - Mech Soft -- Oral - Regular -- Oral - Multi-Consistency -- Oral - Pill -- Oral Phase - Comment --  Pharyngeal Phase 12/14/2021 Pharyngeal Phase Impaired Pharyngeal- Pudding Teaspoon -- Pharyngeal -- Pharyngeal- Pudding Cup -- Pharyngeal -- Pharyngeal- Honey Teaspoon -- Pharyngeal -- Pharyngeal- Honey Cup -- Pharyngeal -- Pharyngeal- Nectar Teaspoon -- Pharyngeal -- Pharyngeal- Nectar Cup -- Pharyngeal -- Pharyngeal- Nectar Straw -- Pharyngeal -- Pharyngeal- Thin Teaspoon -- Pharyngeal -- Pharyngeal- Thin Cup Reduced airway/laryngeal closure;Reduced epiglottic inversion;Penetration/Aspiration during swallow Pharyngeal Material enters airway, CONTACTS cords and not ejected out Pharyngeal- Thin Straw Penetration/Aspiration before swallow;Reduced epiglottic inversion;Reduced  airway/laryngeal closure Pharyngeal Material enters airway, passes BELOW cords and not ejected out despite cough attempt by patient Pharyngeal- Puree WFL Pharyngeal -- Pharyngeal- Mechanical Soft -- Pharyngeal -- Pharyngeal- Regular WFL Pharyngeal -- Pharyngeal- Multi-consistency -- Pharyngeal -- Pharyngeal- Pill -- Pharyngeal -- Pharyngeal Comment --  Cervical Esophageal Phase  12/14/2021 Cervical Esophageal Phase WFL Pudding Teaspoon -- Pudding Cup -- Honey Teaspoon -- Honey Cup -- Nectar Teaspoon -- Nectar Cup -- Nectar Straw -- Thin Teaspoon -- Thin Cup -- Thin Straw -- Puree -- Mechanical Soft -- Regular -- Multi-consistency -- Pill -- Cervical Esophageal Comment -- Osie Bond., M.A. [facility] Pager [phone] Office [phone] 12/14/2021, 12:37 PM                      Microbiology: Results for orders placed or performed during the hospital encounter of 12/12/21  Culture, blood (Routine x 2)     Status: None   Collection Time: 12/12/21  6:45 PM   Specimen: BLOOD  Result Value Ref Range Status   Specimen Description   Final    BLOOD BLOOD RIGHT HAND Performed at [facility], [address] [place], [place] [address]    Special Requests   Final    BOTTLES DRAWN AEROBIC ONLY Blood Culture adequate volume Performed at [facility], [address] [place], [place] [address]    Culture   Final    NO GROWTH 5 DAYS Performed at [facility] [address], [place], [place] [address]    Report Status 12/17/2021 FINAL  Final  Resp Panel by RT-PCR (Flu A&B, Covid) Nasopharyngeal Swab     Status: None   Collection Time: 12/12/21  7:15 PM   Specimen: Nasopharyngeal Swab; Nasopharyngeal(NP) swabs in vial transport medium  Result Value Ref Range Status   SARS Coronavirus 2 by RT PCR NEGATIVE NEGATIVE Final    Comment: (NOTE) SARS-CoV-2 target nucleic acids are NOT DETECTED.  The SARS-CoV-2 RNA is generally detectable in  upper respiratory specimens during the acute phase of infection. The lowest concentration of SARS-CoV-2 viral copies this assay can detect is 138 copies/mL. A negative result does not preclude SARS-Cov-2 infection and should not be used as the sole basis for treatment or other patient management decisions. A negative result may occur with  improper specimen collection/handling, submission of specimen other than nasopharyngeal swab, presence of viral mutation(s) within the areas targeted by this assay, and inadequate number of viral copies(<138 copies/mL). A negative result must be combined with clinical observations, patient history, and epidemiological information. The expected result is Negative.  Fact Sheet for Patients:  EntrepreneurPulse.com.au  Fact Sheet for Healthcare Providers:  IncredibleEmployment.be  This test is no t yet approved or cleared by the [place] FDA and  has been authorized for detection and/or diagnosis of SARS-CoV-2 by FDA under an  Emergency Use Authorization (EUA). This EUA will remain  in effect (meaning this test can be used) for the duration of the COVID-19 declaration under Section 564(b)(1) of the Act, 21 U.S.C.section 360bbb-3(b)(1), unless the authorization is terminated  or revoked sooner.       Influenza A by PCR NEGATIVE NEGATIVE Final   Influenza B by PCR NEGATIVE NEGATIVE Final    Comment: (NOTE) The Xpert Xpress SARS-CoV-2/FLU/RSV plus assay is intended as an aid in the diagnosis of influenza from Nasopharyngeal swab specimens and should not be used as a sole basis for treatment. Nasal washings and aspirates are unacceptable for Xpert Xpress SARS-CoV-2/FLU/RSV testing.  Fact Sheet for Patients: EntrepreneurPulse.com.au  Fact Sheet for Healthcare Providers: IncredibleEmployment.be  This test is not yet approved or cleared by the [place] FDA and has been  authorized for detection and/or diagnosis of SARS-CoV-2 by FDA under an Emergency Use Authorization (EUA). This EUA will remain in effect (meaning this test can be used) for the duration of the COVID-19 declaration under Section 564(b)(1) of the Act, 21 U.S.C. section 360bbb-3(b)(1), unless the authorization is terminated or revoked.  Performed at [facility], [address], [place], [place] [address]   Culture, blood (Routine x 2)     Status: None   Collection Time: 12/12/21  7:30 PM   Specimen: BLOOD  Result Value Ref Range Status   Specimen Description   Final    BLOOD LEFT ANTECUBITAL Performed at [facility], [address], [place], [place] [address]    Special Requests   Final    BOTTLES DRAWN AEROBIC AND ANAEROBIC Blood Culture adequate volume Performed at [facility], [address], [place], [place] [address]    Culture   Final    NO GROWTH 5 DAYS Performed at [facility], [place] [address] [place],  [id number]    Report Status 12/17/2021 FINAL  Final    Labs: CBC: Recent Labs  Lab 12/12/21 1930 12/13/21 1420 12/14/21 0410 12/15/21 0540  WBC 11.5* 13.8* 10.0 6.8  NEUTROABS 9.5* 11.1*  --   --   HGB 11.8* 11.6* 9.8* 9.6*  HCT 35.1* 34.9* 29.3* 28.4*  MCV 98.0 98.0 98.3 98.6  PLT 142* 121* 110* 081*   Basic Metabolic Panel: Recent Labs  Lab 12/12/21 1930 12/13/21 1420 12/14/21 0410 12/15/21 0540  NA 135 137 134* 134*  K 4.7 4.0 3.9 3.8  CL 100 102 102 102  CO2 25 25 24 23   GLUCOSE 115* 84 83 87  BUN 33* 24* 25* 25*  CREATININE 1.30* 1.15* 1.28* 1.21*  CALCIUM 10.3 9.9 8.8* 8.2*   Liver Function Tests: Recent Labs  Lab 12/12/21 1930  AST 21  ALT 10  ALKPHOS 44  BILITOT 0.7  PROT 6.9  ALBUMIN 4.3   CBG: No results for input(s): GLUCAP in the last 168 hours.  Discharge time spent: greater than 30 minutes.  Signed: Raiford Noble, DO [facility] 12/17/2021

## 2021-12-15 NOTE — Progress Notes (Signed)
Oxygen sat at rest 98% Oxygen sat when ambulating 90-94% Oxygen sat post ambulating 97% [name] B [place], RN

## 2021-12-15 NOTE — TOC Transition Note (Signed)
Transition of Care [facility]) - CM/SW Discharge Note   Patient Details  Name: LINDYN VOSSLER MRN: [id number] Date of Birth: 27-May-1932  [facility]) CM/SW Contact:  Zenon Mayo, RN Phone Number: 12/15/2021, 1:04 PM   Clinical Narrative:    NCM spoke with patient at bedside, she lives with spouse, who is supportive to her. She has a cane ,walker and walk in shower with seat at home.  She states her spouse will transport her home at discharge.  Per pt eval rec outpatient physical therapy, NCM asked is she would like for this to be set up for her she states yes, she would like to go to [facility].  NCM made referral thru epic for her.   Final next level of care: Home/Self Care Barriers to Discharge: Continued Medical Work up   Patient Goals and CMS Choice Patient states their goals for this hospitalization and ongoing recovery are:: return home with spouse   Choice offered to / list presented to : NA  Discharge Placement                       Discharge Plan and Services   Discharge Planning Services: CM Consult Post Acute Care Choice: NA            DME Agency: NA       HH Arranged: NA          Social Determinants of Health (SDOH) Interventions     Readmission Risk Interventions No flowsheet data found.

## 2021-12-15 NOTE — Progress Notes (Signed)
Speech Language Pathology Treatment: Dysphagia  Patient Details Name: Cindy [name] MRN: [id number] DOB: [birth year] Today's Date: 12/15/2021 Time: 0254-2706 SLP Time Calculation (min) (ACUTE ONLY): 21 min  Assessment / Plan / Recommendation Clinical Impression  Pt was seen for f/u after MBS on previous date. She had her sign with swallowing strategies taped to her bedside table so she could see it, and reports that she has been implementing strategies since yesterday without difficulty. SLP provided Min cues, faded to Mod I as pt continued to sip on water via cup with use of chin tuck. Education was reinforced with pt and spouse about strategies to try to increase safety with swallowing but also additional strategies to try to reduce the risk of adverse events, such as thorough oral care and staying active. Will continue to follow acutely, recommending OP SLP upon discharge.    HPI HPI: Pt is an 88 yo female presenting with AMS, admitted with sepsis and hypoxia due to PNA. Family reported to MD that pt coughs with PO intake, and they feel it could be related to not chewing her food enough. PMH includes: COPD, GERD, thyroid disease, asthma, CAD, HTN, heart attack      SLP Plan  Continue with current plan of care      Recommendations for follow up therapy are one component of a multi-disciplinary discharge planning process, led by the attending physician.  Recommendations may be updated based on patient status, additional functional criteria and insurance authorization.    Recommendations  Diet recommendations: Regular;Thin liquid Liquids provided via: Cup;No straw Medication Administration: Whole meds with puree Supervision: Patient able to self feed;Intermittent supervision to cue for compensatory strategies Compensations: Slow rate;Small sips/bites;Clear throat intermittently;Chin tuck;Other (Comment) (chew food well) Postural Changes and/or Swallow Maneuvers: Seated upright 90 degrees                 Oral Care Recommendations: Oral care BID Follow Up Recommendations: Outpatient SLP Assistance recommended at discharge: Intermittent Supervision/Assistance SLP Visit Diagnosis: Dysphagia, pharyngeal phase (R13.13) Plan: Continue with current plan of care           Osie Bond., M.A. [facility] Pager [phone] Office [phone]  12/15/2021, 12:26 PM

## 2021-12-17 DIAGNOSIS — G9341 Metabolic encephalopathy: Secondary | ICD-10-CM

## 2021-12-17 LAB — CULTURE, BLOOD (ROUTINE X 2)
Culture: NO GROWTH
Culture: NO GROWTH
Special Requests: ADEQUATE
Special Requests: ADEQUATE

## 2021-12-17 NOTE — Assessment & Plan Note (Signed)
See above

## 2021-12-17 NOTE — Hospital Course (Signed)
The patient is an 88 year old elderly Caucasian female with a past medical history significant for but limited to CAD, COPD, CKD stage IIIa, hypertension, dyslipidemia as well as other comorbidities was brought to the ED with altered mental status, cough, chills.  She has a history of chronic cough but is worse in the last few weeks and yesterday her spouse and son found her disoriented and febrile and she was brought to the ED.  In the ED her temperature was 104.3 and she was tachypneic, tachycardic as well as hypoxic and had a WBC of 11.5.  Creatinine was 1.3 and lactic acid was 1.1.  UA was obtained and she had a CT of the chest done that was noted to have a right base consolidation which was concerning for aspiration pneumonia.  Started on antibiotics and antibiotics were changed to IV Unasyn.  Her encephalopathy improved significantly and she felt much better and is back to baseline.  She ambulated without issues and did not desaturate.  She is deemed medically stable after PT evaluation and they recommended outpatient PT and OT.  She will need to repeat her chest x-ray in 3 to 6 weeks and continue antibiotic course duration.

## 2021-12-17 NOTE — Assessment & Plan Note (Signed)
-  Resolved. She is back to baseline

## 2021-12-18 ENCOUNTER — Other Ambulatory Visit (HOSPITAL_COMMUNITY): Payer: Self-pay

## 2021-12-18 DIAGNOSIS — R410 Disorientation, unspecified: Secondary | ICD-10-CM | POA: Diagnosis not present

## 2021-12-18 DIAGNOSIS — K1379 Other lesions of oral mucosa: Secondary | ICD-10-CM | POA: Diagnosis not present

## 2021-12-18 DIAGNOSIS — J439 Emphysema, unspecified: Secondary | ICD-10-CM | POA: Diagnosis not present

## 2021-12-18 DIAGNOSIS — J189 Pneumonia, unspecified organism: Secondary | ICD-10-CM | POA: Diagnosis not present

## 2021-12-18 DIAGNOSIS — Z09 Encounter for follow-up examination after completed treatment for conditions other than malignant neoplasm: Secondary | ICD-10-CM | POA: Diagnosis not present

## 2021-12-18 DIAGNOSIS — R1310 Dysphagia, unspecified: Secondary | ICD-10-CM | POA: Diagnosis not present

## 2021-12-18 DIAGNOSIS — J9601 Acute respiratory failure with hypoxia: Secondary | ICD-10-CM | POA: Diagnosis not present

## 2021-12-18 DIAGNOSIS — R899 Unspecified abnormal finding in specimens from other organs, systems and tissues: Secondary | ICD-10-CM | POA: Diagnosis not present

## 2021-12-29 ENCOUNTER — Ambulatory Visit: Payer: Medicare (Managed Care)

## 2021-12-29 ENCOUNTER — Other Ambulatory Visit: Payer: Self-pay

## 2021-12-29 ENCOUNTER — Ambulatory Visit: Payer: Medicare (Managed Care) | Attending: Family Medicine | Admitting: Physical Therapy

## 2021-12-29 ENCOUNTER — Encounter: Payer: Self-pay | Admitting: Physical Therapy

## 2021-12-29 DIAGNOSIS — R1313 Dysphagia, pharyngeal phase: Secondary | ICD-10-CM | POA: Diagnosis not present

## 2021-12-29 DIAGNOSIS — R2689 Other abnormalities of gait and mobility: Secondary | ICD-10-CM | POA: Insufficient documentation

## 2021-12-29 DIAGNOSIS — M6281 Muscle weakness (generalized): Secondary | ICD-10-CM | POA: Diagnosis not present

## 2021-12-29 DIAGNOSIS — R2681 Unsteadiness on feet: Secondary | ICD-10-CM | POA: Diagnosis not present

## 2021-12-29 NOTE — Therapy (Signed)
OUTPATIENT SPEECH LANGUAGE PATHOLOGY SWALLOW EVALUATION   Patient Name: Cindy [name] MRN: [id number] DOB:[birth year], 88 y.o., female Today's Date: 12/29/2021  PCP: Lawerance Cruel, MD REFERRING PROVIDER: Lawerance Cruel, MD   End of Session - 12/29/21 1311     Visit Number 1    Number of Visits 13    Date for SLP Re-Evaluation 02/09/22    Authorization Type [employer]    SLP Start Time 1315    SLP Stop Time  1400    SLP Time Calculation (min) 45 min    Activity Tolerance Patient tolerated treatment well;Treatment limited secondary to agitation   reduced comprehension of rationale for ST evaluation/intervention            Past Medical History:  Diagnosis Date   Asthma    Bronchitis    COPD (chronic obstructive pulmonary disease) ([place])    Coronary artery disease    MI age 89   GERD (gastroesophageal reflux disease)    Heart attack ([place])    Hypercholesteremia    Hypertension    Iron deficiency anemia    used to see hem in [place], [place].    Renal insufficiency    Thyroid disease    Past Surgical History:  Procedure Laterality Date   APPENDECTOMY     BREAST BIOPSY Right    benign   CORONARY ANGIOPLASTY WITH STENT PLACEMENT     CORONARY ARTERY BYPASS GRAFT  09/1979   LEFT HEART CATH AND CORS/GRAFTS ANGIOGRAPHY N/A 11/27/2018   Procedure: LEFT HEART CATH AND CORS/GRAFTS ANGIOGRAPHY;  Surgeon: Lorretta Harp, MD;  Location: [facility];  Service: Cardiovascular;  Laterality: N/A;   Moh's Surgeries     for squamous cell carcinoma   PARTIAL HYSTERECTOMY  10/1978   Patient Active Problem List   Diagnosis Date Noted   Septic encephalopathy 12/17/2021   Acute respiratory failure with hypoxia ([place]) 12/13/2021   Leukocytosis 12/13/2021   Acute respiratory failure with hypoxia secondary to sepsis due to pneumonia ([place]) 12/12/2021   Essential (primary) hypertension 12/25/2018   Carotid artery disease ([place]) 12/25/2018   Chest pain of uncertain etiology     Unstable angina ([place]) 11/26/2018   Sepsis ([place]) 01/06/2017   Thrombocytopenia ([place]) 01/06/2017   Lower urinary tract infectious disease 01/06/2017   Hypothyroidism 01/06/2017   Anemia 71/03/2693   Umbilical hernia 85/46/2703   Coronary atherosclerosis of native coronary artery 12/11/2013   Old MI (myocardial infarction) 12/11/2013   History of tobacco use 12/11/2013   Angina decubitus ([place]) 12/11/2013   Hyperlipidemia 12/11/2013   Rhinitis 09/16/2012   ILD (interstitial lung disease) ([place]) 05/08/2012   Pulmonary nodule, right 05/06/2012   Cough 03/20/2012   Iron deficiency anemia    Chronic kidney disease, stage 3b ([place])     ONSET DATE: 12-12-21   REFERRING DIAG: R13.10 (ICD-10-CM) - Dysphagia, unspecified   THERAPY DIAG:  Dysphagia, pharyngeal phase  SUBJECTIVE:   SUBJECTIVE STATEMENT: "I forget to do it (chin tuck) when I'm eating"  Pt accompanied by: significant other John  PERTINENT HISTORY:  Pt is an 88 yo female who presented to hospital on 12-12-21 for AMS and was admitted with sepsis and hypoxia due to PNA. Family reported to MD that pt coughs with PO intake, and they feel it could be related to not chewing her food enough. PMH includes: COPD, GERD, thyroid disease, asthma, CAD, HTN, heart attack  PAIN:  Are you having pain? Yes: NPRS scale: 8/10 Pain location: back and  neck   FALLS: Has patient fallen in last 6 months?  No  LIVING ENVIRONMENT: Lives with: lives with their family Lives in: House/apartment  PLOF:  Level of assistance: Needed assistance with [employer] Employment: Retired   PATIENT GOALS: to reduce risk of aspiration and repeat hospitalization   OBJECTIVE:   DIAGNOSTIC FINDINGS (MBSS): "Pt has a mild pharyngeal dysphagia, characterized primarily by mistimed swallowing and mildly reduced epiglottic inversion. Trace penetration as deep as to the vocal folds and intermittent aspiration occurs with thin liquids. She cannot clear aspirates with  reflexive or volitional coughing despite small volume. Aspiration primarily occurs when using a straw and is not eliminated with use of a chin tuck. Pt is only able to tuck her chin a little bit due to reports of neck pain, but even subtle movement that she can tolerate without pain is adequate to increase airway protection when also cued for small, single cup sips. Although penetration still occurs, it is more shallow and mostly clears upon completion of the swallow. Recommend regular solids and thin liquids by cup via small, single sips and use of a chin tuck as tolerated."   SLP Diet Recommendations: Regular solids;Thin liquid    Liquid Administration via: Cup;No straw    Medication Administration: Whole meds with puree    Supervision: Patient able to self feed;Intermittent supervision to cue for compensatory strategies    Compensations: Slow rate;Small sips/bites;Clear throat intermittently;Chin tuck;Other (Comment) (chew food well; one sip at a time)    Postural Changes: Seated upright at 90 degrees    Oral Care Recommendations: Oral care BID    RECOMMENDATIONS FROM OBJECTIVE SWALLOW STUDY (MBSS/FEES):   Objective swallow impairments: mistimed swallow initiation and mildly reduced epiglottic inversion  Objective recommended compensations: No straw, Chin Tuck, Intermittent throat clears  COGNITION: Overall cognitive status: History of cognitive impairments - at baseline Areas of impairment: Attention, Memory, and Awareness Comments: mild memory loss reported in hospital   CLINICAL SWALLOW ASSESSMENT:   Current diet: regular and thin liquids Dentition: adequate natural dentition Patient directly observed with POs: Yes: regular and thin liquids  Feeding: able to feed self Liquids provided by: cup (no straws) Oral phase signs and symptoms: oral holding (seemingly secondary to chin tuck)  Pharyngeal phase signs and symptoms: suspected delayed swallow initiation, immediate cough, and  delayed cough (with small cup sips and chin tuck)    TODAY'S TREATMENT:  Extensive education re: MBSS results, pharyngeal deficits, and implications (aspiration PNA, rehospitalization) conducted with patient and husband. Pt exhibited reduced understanding of impact of swallow deficits on safety and overall health. Husband exhibited improved understanding and attempted to rephrase for patient without success. Despite being hesitant of initiation of swallow therapy, pt agreeable to trial.    PATIENT EDUCATION: Education details: Compensatory swallow strategies, recommendation for swallow exercises and possibly EMST  Person educated: Patient and Spouse Education method: Explanation, Demonstration, and Verbal cues Education comprehension: verbalized understanding and needs further education   ASSESSMENT:  CLINICAL IMPRESSION: Patient is a 88 y.o. female who was seen today for pharyngeal dysphagia. Pt recently hospitalized with sepsis and hypoxia secondary to RLL PNA. MBSS revealed mild pharyngeal dysphagia with recommendation for no straw and chin tuck. Pt presented with intermittent immediate/delayed coughing with cup sips of thin liquids despite small single sips and chin tuck. Pt appeared hesitant to participate in ST services to address swallow impairment as pt exhibited decreased comprehension of aspiration risk despite multiple educational opportunities. Pt's husband exhibited understanding and agreement with initiation  of ST services.    OBJECTIVE IMPAIRMENTS include dysphagia. These impairments are limiting patient from safety when swallowing. Factors affecting potential to achieve goals and functional outcome are ability to learn/carryover information and cooperation/participation level. Patient will benefit from skilled SLP services to address above impairments and improve overall function.  REHAB POTENTIAL: Fair - exhibited reduced motivation to address deficits (maybe in part related to  cognitive component/reduced awareness)   GOALS: Goals reviewed with patient? Yes  SHORT TERM GOALS: Target date: 01/26/2022  Pt will verbalize and/or demonstrate recommended compensatory swallow strategies to reduce risk of aspiration given rare min A over 2 sessions  Baseline:  Goal status: INITIAL  2.  Pt will complete recommended swallow exercises to target pharyngeal deficits given usual mod A over 2 sessions  Baseline:  Goal status: INITIAL  3.  Pt will participate in RMST exercises to maximize cough strength given usual mod A over 2 sessions  Baseline:  Goal status: INITIAL  LONG TERM GOALS: Target date: 02/09/2022  Pt will carryover recommended swallow precautions given rare cues from family > 1 week Baseline:  Goal status: INITIAL  2.  Pt will complete swallow exercise HEP given occasional min A from family > 1 week  Baseline:  Goal status: INITIAL  3.  Pt/caregiver will report improved swallow function and reduced s/sx of aspiration by last ST session  Baseline:  Goal status: INITIAL  PLAN: SLP FREQUENCY: 2x/week  SLP DURATION: 6 weeks  PLANNED INTERVENTIONS: Aspiration precaution training, Pharyngeal strengthening exercises, and Diet toleration management     Marzetta Board, CCC-SLP 12/29/2021, 2:03 PM

## 2021-12-29 NOTE — Therapy (Signed)
OUTPATIENT PHYSICAL THERAPY NEURO EVALUATION   Patient Name: Cindy [name] MRN: [id number] DOB:[birth year], 88 y.o., female Today's Date: 12/29/2021  PCP: Lawerance Cruel, MD REFERRING PROVIDER: Lawerance Cruel, MD - pt referred by hospitalist, sending to PCP    PT End of Session - 12/29/21 1505     Visit Number 1    Number of Visits 9    Date for PT Re-Evaluation 01/28/22    Authorization Type [employer]    PT Start Time 1240   pt in restroom at start of eval   PT Stop Time 1317    PT Time Calculation (min) 37 min    Equipment Utilized During Treatment Gait belt    Activity Tolerance Patient tolerated treatment well    Behavior During Therapy WFL for tasks assessed/performed             Past Medical History:  Diagnosis Date   Asthma    Bronchitis    COPD (chronic obstructive pulmonary disease) ([place])    Coronary artery disease    MI age 25   GERD (gastroesophageal reflux disease)    Heart attack ([place])    Hypercholesteremia    Hypertension    Iron deficiency anemia    used to see hem in [place], [place].    Renal insufficiency    Thyroid disease    Past Surgical History:  Procedure Laterality Date   APPENDECTOMY     BREAST BIOPSY Right    benign   CORONARY ANGIOPLASTY WITH STENT PLACEMENT     CORONARY ARTERY BYPASS GRAFT  09/1979   LEFT HEART CATH AND CORS/GRAFTS ANGIOGRAPHY N/A 11/27/2018   Procedure: LEFT HEART CATH AND CORS/GRAFTS ANGIOGRAPHY;  Surgeon: Lorretta Harp, MD;  Location: [facility];  Service: Cardiovascular;  Laterality: N/A;   Moh's Surgeries     for squamous cell carcinoma   PARTIAL HYSTERECTOMY  10/1978   Patient Active Problem List   Diagnosis Date Noted   Septic encephalopathy 12/17/2021   Acute respiratory failure with hypoxia ([place]) 12/13/2021   Leukocytosis 12/13/2021   Acute respiratory failure with hypoxia secondary to sepsis due to pneumonia ([place]) 12/12/2021   Essential (primary) hypertension 12/25/2018   Carotid  artery disease ([place]) 12/25/2018   Chest pain of uncertain etiology    Unstable angina ([place]) 11/26/2018   Sepsis ([place]) 01/06/2017   Thrombocytopenia ([place]) 01/06/2017   Lower urinary tract infectious disease 01/06/2017   Hypothyroidism 01/06/2017   Anemia 40/98/1191   Umbilical hernia 47/82/9562   Coronary atherosclerosis of native coronary artery 12/11/2013   Old MI (myocardial infarction) 12/11/2013   History of tobacco use 12/11/2013   Angina decubitus ([place]) 12/11/2013   Hyperlipidemia 12/11/2013   Rhinitis 09/16/2012   ILD (interstitial lung disease) ([place]) 05/08/2012   Pulmonary nodule, right 05/06/2012   Cough 03/20/2012   Iron deficiency anemia    Chronic kidney disease, stage 3b ([place])     ONSET DATE: 12/15/2021   REFERRING DIAG: Other abnormalities of gait and mobility (R26.89);Muscle weakness (generalized) (M62.81)   THERAPY DIAG:  Unsteadiness on feet  Other abnormalities of gait and mobility  Muscle weakness (generalized)  SUBJECTIVE:  SUBJECTIVE STATEMENT:  Presented 2/21 with confusion, found to have sepsis due to PNA. Discharged home on 2/24. Feels like she is almost back to her baseline. Reports balance has not been good for a long time. Still doesn't have the same energy and is feeling weaker. Trying to get up and out more. Per husband, pt has been sleeping a lot and is not doing as much activity.    Pt accompanied by: significant other, husband John   PERTINENT HISTORY: Asthma, bronchitis, COPD, CAD (MI at age 89), HTN, renal insufficiency, and thyroid disease.  PAIN:  Are you having pain? Yes - having back and neck pain. Takes Tramadol 2x a day to help with arthritic pain.   PRECAUTIONS: Fall  WEIGHT BEARING RESTRICTIONS No  FALLS: Has patient fallen in last 6  months? No, Number of falls: 0   LIVING ENVIRONMENT: Lives with: lives with their spouse Lives in: House/apartment - Condo  Stairs: No; External: 1 steps; none Has following equipment at home: Single point cane and Walker - 2 wheeled  Husband helps out with cooking.   PLOF: Independent  PATIENT GOALS "I don't know why they sent me here for PT". Per husband, wants pt to work on strengthening and balance.   OBJECTIVE:   COGNITION: Overall cognitive status: Within functional limits for tasks assessed   SENSATION: Light touch: WFL     POSTURE: rounded shoulders, forward head, and posterior pelvic tilt   MMT:    MMT Right 12/29/2021 Left 12/29/2021  Hip flexion 4/5 4/5  Hip extension    Hip abduction    Hip adduction    Hip internal rotation    Hip external rotation    Knee flexion 5/5 5/5  Knee extension 5/5 5/5  Ankle dorsiflexion 5/5 5/5  Ankle plantarflexion    Ankle inversion    Ankle eversion    (Blank rows = not tested)    TRANSFERS: Assistive device utilized: None  Sit to stand: SBA Stand to sit: SBA No UE support    STAIRS:  Level of Assistance: SBA  Stair Negotiation Technique: Alternating Pattern  with Bilateral Rails  Number of Stairs:4   Height of Stairs: 6  Comments: Pt reports having stairs at her grandkids.   GAIT: Gait pattern: step through pattern, decreased stride length, and trunk flexed Bilat genu valgum.  Distance walked: Clinic distances during eval.  Assistive device utilized: None Level of assistance: SBA   FUNCTIONAL TESTs:  5 times sit to stand: 13.5 no UE support 30 seconds chair stand test 11 sit <> stands (above average for age) Timed up and go (TUG): 12.35 sec no AD  10 meter walk test: 16.62 seconds = 1.97 ft/sec, limited community ambulator   [facility] PT Assessment - 12/29/21 1312       Functional Gait  Assessment   Gait assessed  Yes    Gait Level Surface Walks 20 ft, slow speed, abnormal gait pattern, evidence  for imbalance or deviates 10-15 in outside of the 12 in walkway width. Requires more than 7 sec to ambulate 20 ft.   9.69   Change in Gait Speed Able to change speed, demonstrates mild gait deviations, deviates 6-10 in outside of the 12 in walkway width, or no gait deviations, unable to achieve a major change in velocity, or uses a change in velocity, or uses an assistive device.    Gait with Horizontal Head Turns Performs head turns smoothly with slight change in gait velocity (eg, minor disruption to  smooth gait path), deviates 6-10 in outside 12 in walkway width, or uses an assistive device.   limited ROM   Gait with Vertical Head Turns Performs task with slight change in gait velocity (eg, minor disruption to smooth gait path), deviates 6 - 10 in outside 12 in walkway width or uses assistive device   limited ROM   Gait and Pivot Turn Pivot turns safely within 3 sec and stops quickly with no loss of balance.    Step Over Obstacle Is able to step over one shoe box (4.5 in total height) but must slow down and adjust steps to clear box safely. May require verbal cueing.    Gait with Narrow Base of Support Ambulates less than 4 steps heel to toe or cannot perform without assistance.    Gait with Eyes Closed Cannot walk 20 ft without assistance, severe gait deviations or imbalance, deviates greater than 15 in outside 12 in walkway width or will not attempt task.   20.81 sec   Ambulating Backwards Walks 20 ft, slow speed, abnormal gait pattern, evidence for imbalance, deviates 10-15 in outside 12 in walkway width.   25.4 sec   Steps Alternating feet, must use rail.    Total Score 14    FGA comment: 14/30 = high fall risk             PATIENT SURVEYS:  N/A    PATIENT EDUCATION: Education details: Clinical findings, POC.  Person educated: Patient Education method: Explanation Education comprehension: verbalized understanding   HOME EXERCISE PROGRAM: Will provide at next session.    ASSESSMENT:  CLINICAL IMPRESSION: Patient is a 88 year old female referred to Neuro OPPT for muscle weakness/gait abnormality. Pt recently hospitalized due to sepsis/PNA.   Pt's PMH is significant for: asthma, bronchitis, COPD, CAD, HTN, renal insufficiency, and thyroid disease.. The following deficits were present during the exam: decr strength, gait abnormalities, postural abnormalities, impaired balance. Based on FGA, pt is an incr risk for falls. Pt's gait speed with no AD indicates that pt is a limited community ambulator. Pt would benefit from skilled PT to address these impairments and functional limitations to maximize functional mobility independence and decr fall risk.     OBJECTIVE IMPAIRMENTS Abnormal gait, decreased activity tolerance, decreased balance, decreased endurance, decreased strength, postural dysfunction, and pain.   ACTIVITY LIMITATIONS meal prep and medication management.   PERSONAL FACTORS Behavior pattern, Past/current experiences, and 3+ comorbidities:  asthma, bronchitis, COPD, CAD, HTN, renal insufficiency, and thyroid disease.  are also affecting patient's functional outcome.    REHAB POTENTIAL: Good  CLINICAL DECISION MAKING: Stable/uncomplicated  EVALUATION COMPLEXITY: Low   GOALS: Goals reviewed with patient? Yes  SHORT TERM GOALS: ALL STGS = LTGS  LONG TERM GOALS: ALL LTGS DUE: Target date: 02/09/2022  Pt will be independent with final HEP in order to build upon functional gains made in therapy. Baseline: Does not have HEP. Goal status: INITIAL  2.  Pt will improve FGA to at least 18/30 in order to demo decr fall risk.  Baseline: 14/30  Goal status: INITIAL  3.  Pt will improve gait speed with no AD vs. LRAD to at least 2.4 ft/sec in order to demo improved community mobility.   Baseline: 1.97 ft/sec with no AD  Goal status: INITIAL  4.  Pt will ambulate at least 500' over level indoor and outdoor surfaces with no AD vs. LRAD with  supervision in order to demo improved community mobility.  Baseline: No AD, but  might benefit from use of cane over outdoor surfaces.  Goal status: INITIAL  5.  Pt will verbalize understanding of fall prevention in the home. Baseline:  Goal status:    PLAN: PT FREQUENCY: 2x/week  PT DURATION: 4 weeks  PLANNED INTERVENTIONS: Therapeutic exercises, Therapeutic activity, Neuromuscular re-education, Balance training, Gait training, Patient/Family education, Stair training, Vestibular training, and DME instructions  PLAN FOR NEXT SESSION: Initial HEP - balance (eyes closed, head motions, narrow BOS, SLS), work on obstacle negotiation, functional strengthening. Might look at cane for AD esp for outdoors due to high fall risk?    Arliss Journey, PT, DPT  12/29/2021, 3:07 PM

## 2022-01-01 ENCOUNTER — Other Ambulatory Visit (HOSPITAL_COMMUNITY): Payer: Self-pay

## 2022-01-02 ENCOUNTER — Encounter: Payer: Self-pay | Admitting: Physical Therapy

## 2022-01-02 ENCOUNTER — Other Ambulatory Visit: Payer: Self-pay

## 2022-01-02 ENCOUNTER — Ambulatory Visit: Payer: Medicare (Managed Care)

## 2022-01-02 ENCOUNTER — Ambulatory Visit: Payer: Medicare (Managed Care) | Admitting: Physical Therapy

## 2022-01-02 DIAGNOSIS — R2689 Other abnormalities of gait and mobility: Secondary | ICD-10-CM

## 2022-01-02 DIAGNOSIS — R1313 Dysphagia, pharyngeal phase: Secondary | ICD-10-CM

## 2022-01-02 DIAGNOSIS — R2681 Unsteadiness on feet: Secondary | ICD-10-CM

## 2022-01-02 DIAGNOSIS — M6281 Muscle weakness (generalized): Secondary | ICD-10-CM

## 2022-01-02 NOTE — Patient Instructions (Signed)
Access Code: [email or web address] ?URL: [email or web address] ?Date: 01/02/2022 ?Prepared by: Elease Etienne ? ?Exercises ?Corner Balance Feet Together With Eyes Closed - 1 x daily - 5 x weekly - 1 sets - 2 reps ?Corner Balance Feet Together: Eyes Open With Head Turns - 1 x daily - 5 x weekly - 1 sets - 2 reps ?Sit to Stand - 1 x daily - 5 x weekly - 1 sets - 2 reps ?Semi-Tandem Corner Balance With Eyes Open - 1 x daily - 5 x weekly - 2 sets - 1 reps ?Semi-Tandem Corner Balance With Eyes Closed - 1 x daily - 5 x weekly - 3 sets - 2 reps ? ?

## 2022-01-02 NOTE — Therapy (Signed)
?OUTPATIENT SPEECH LANGUAGE PATHOLOGY TREATMENT NOTE ? ? ?Patient Name: Cindy [name] ?MRN: [id number] ?DOB:Apr 06, 1932, 88 y.o., female ?Today's Date: 01/02/2022 ? ?PCP: Lawerance Cruel, MD ?REFERRING PROVIDER: Lawerance Cruel, MD ? ? End of Session - 01/02/22 1427   ? ? Visit Number 2   ? Number of Visits 13   ? Date for SLP Re-Evaluation 02/09/22   ? Authorization Type [employer]   ? SLP Start Time 1455   ? SLP Stop Time  1535   ? SLP Time Calculation (min) 40 min   ? Activity Tolerance Patient tolerated treatment well   ? ?  ?  ? ?  ? ? ?Past Medical History:  ?Diagnosis Date  ? Asthma   ? Bronchitis   ? COPD (chronic obstructive pulmonary disease) ([place])   ? Coronary artery disease   ? MI age 29  ? GERD (gastroesophageal reflux disease)   ? Heart attack ([place])   ? Hypercholesteremia   ? Hypertension   ? Iron deficiency anemia   ? used to see hem in [place], [place].   ? Renal insufficiency   ? Thyroid disease   ? ?Past Surgical History:  ?Procedure Laterality Date  ? APPENDECTOMY    ? BREAST BIOPSY Right   ? benign  ? CORONARY ANGIOPLASTY WITH STENT PLACEMENT    ? CORONARY ARTERY BYPASS GRAFT  09/1979  ? LEFT HEART CATH AND CORS/GRAFTS ANGIOGRAPHY N/A 11/27/2018  ? Procedure: LEFT HEART CATH AND CORS/GRAFTS ANGIOGRAPHY;  Surgeon: Lorretta Harp, MD;  Location: [facility];  Service: Cardiovascular;  Laterality: N/A;  ? Moh's Surgeries    ? for squamous cell carcinoma  ? PARTIAL HYSTERECTOMY  10/1978  ? ?Patient Active Problem List  ? Diagnosis Date Noted  ? Septic encephalopathy 12/17/2021  ? Acute respiratory failure with hypoxia ([place]) 12/13/2021  ? Leukocytosis 12/13/2021  ? Acute respiratory failure with hypoxia secondary to sepsis due to pneumonia ([place]) 12/12/2021  ? Essential (primary) hypertension 12/25/2018  ? Carotid artery disease ([place]) 12/25/2018  ? Chest pain of uncertain etiology   ? Unstable angina ([place]) 11/26/2018  ? Sepsis ([place]) 01/06/2017  ? Thrombocytopenia ([place]) 01/06/2017  ? Lower  urinary tract infectious disease 01/06/2017  ? Hypothyroidism 01/06/2017  ? Anemia 01/06/2017  ? Umbilical hernia 17/49/4496  ? Coronary atherosclerosis of native coronary artery 12/11/2013  ? Old MI (myocardial infarction) 12/11/2013  ? History of tobacco use 12/11/2013  ? Angina decubitus ([place]) 12/11/2013  ? Hyperlipidemia 12/11/2013  ? Rhinitis 09/16/2012  ? ILD (interstitial lung disease) ([place]) 05/08/2012  ? Pulmonary nodule, right 05/06/2012  ? Cough 03/20/2012  ? Iron deficiency anemia   ? Chronic kidney disease, stage 3b ([place])   ? ? ?ONSET DATE: 12-12-21 ? ?REFERRING DIAG: R13.10 (ICD-10-CM) - Dysphagia, unspecified  ? ?THERAPY DIAG: Dysphagia, pharyngeal phase ? ?SUBJECTIVE: "I try to remember to tuck the chin but I sometimes forget" ? ?PAIN:  ?Are you having pain? Yes ?NPRS scale: 5/10 ?Pain location: neck ? ?OBJECTIVE:  ? ?DIAGNOSTIC FINDINGS (MBSS): "Pt has a mild pharyngeal dysphagia, characterized primarily by mistimed swallowing and mildly reduced epiglottic inversion. Trace penetration as deep as to the vocal folds and intermittent aspiration occurs with thin liquids. She cannot clear aspirates with reflexive or volitional coughing despite small volume. Aspiration primarily occurs when using a straw and is not eliminated with use of a chin tuck. Pt is only able to tuck her chin a little bit due to reports of neck pain, but  even subtle movement that she can tolerate without pain is adequate to increase airway protection when also cued for small, single cup sips. Although penetration still occurs, it is more shallow and mostly clears upon completion of the swallow. Recommend regular solids and thin liquids by cup via small, single sips and use of a chin tuck as tolerated." ?  ? SLP Diet Recommendations: Regular solids;Thin liquid ? Liquid Administration via: Cup;No straw ? Medication Administration: Whole meds with puree ? Supervision: Patient able to self feed;Intermittent supervision to cue for  compensatory strategies ? Compensations: Slow rate;Small sips/bites;Clear throat intermittently;Chin tuck;Other (Comment) (chew food well; one sip at a time) ? Postural Changes: Seated upright at 90 degrees ? Oral Care Recommendations: Oral care BID ?  ?  ?RECOMMENDATIONS FROM OBJECTIVE SWALLOW STUDY (MBSS/FEES):   ?Objective swallow impairments: mistimed swallow initiation and mildly reduced epiglottic inversion  ?Objective recommended compensations: No straw, Chin Tuck, Intermittent throat clears ?  ?  ?TODAY'S TREATMENT:  ?01-02-22 ?Initiated education and training of swallow exercises (Effortful swallow) and Expiratory Muscle Strength Training (EMST) to target pharyngeal deficits and reduced cough strength to maximize swallow function and safety. SLP conducted assessment of MEP for EMST. Pt's maximum expiratory pressure today was 42 cm H2O. EMST 150 device was started at 75% MEP at 31 cm H2O. SLP provided demonstration and verbal instruction to utilize EMST device. Pt completed 5 repetitions over 2 sets during our session with usual fading to rare cues for short, strong exhalations. HEP initiated for RMST (5 sets of 5 reps for 5 days/week) and effortful swallows of saliva (due to decreased bolus coordination with water). Pt consumed small, single sips of thin liquids via cup with coughing episode x1 (when trialing effortful swallow).  ? ?12-29-21 ?Extensive education re: MBSS results, pharyngeal deficits, and implications (aspiration PNA, rehospitalization) conducted with patient and husband. Pt exhibited reduced understanding of impact of swallow deficits on safety and overall health. Husband exhibited improved understanding and attempted to rephrase for patient without success. Despite being hesitant of initiation of swallow therapy, pt agreeable to trial.  ?  ?  ?PATIENT EDUCATION: ?Education details: see above ?Person educated: Patient and Spouse ?Education method: Explanation, Demonstration, Verbal cues,  Handout ?Education comprehension: verbalized understanding/demonstration and needs further education ?  ?  ?ASSESSMENT: ?  ?CLINICAL IMPRESSION: ?Patient is a 88 y.o. female who was seen today for pharyngeal dysphagia. Pt recently hospitalized with sepsis and hypoxia secondary to RLL PNA. MBSS revealed mild pharyngeal dysphagia with recommendation for no straw and chin tuck. Initiated education and training for EMST and effortful swallows to target pharyngeal deficits and maximize swallow function/cough production.   ?  ?OBJECTIVE IMPAIRMENTS include dysphagia. These impairments are limiting patient from safety when swallowing. ?Factors affecting potential to achieve goals and functional outcome are ability to learn/carryover information and cooperation/participation level. Patient will benefit from skilled SLP services to address above impairments and improve overall function. ?  ?REHAB POTENTIAL: [place] - cognitive component ?  ?  ?GOALS: ?Goals reviewed with patient? Yes ?  ?SHORT TERM GOALS: Target date: 01/26/2022 ?  ?Pt will verbalize and/or demonstrate recommended compensatory swallow strategies to reduce risk of aspiration given rare min A over 2 sessions  ?Baseline:  ?Goal status: IN PROGRESS ?  ?2.  Pt will complete recommended swallow exercises to target pharyngeal deficits given usual mod A over 2 sessions  ?Baseline:  ?Goal status: IN PROGRESS ?  ?3.  Pt will participate in RMST exercises to maximize cough strength given  usual mod A over 2 sessions  ?Baseline:  ?Goal status: IN PROGRESS ?  ?LONG TERM GOALS: Target date: 02/09/2022 ?  ?Pt will carryover recommended swallow precautions given rare cues from family > 1 week ?Baseline:  ?Goal status: IN PROGRESS ?  ?2.  Pt will complete swallow exercise HEP given occasional min A from family > 1 week  ?Baseline:  ?Goal status: IN PROGRESS ?  ?3.  Pt/caregiver will report improved swallow function and reduced s/sx of aspiration by last ST session  ?Baseline:   ?Goal status: IN PROGRESS ?  ?PLAN: ?SLP FREQUENCY: 2x/week ?  ?SLP DURATION: 6 weeks ?  ?PLANNED INTERVENTIONS: Aspiration precaution training, Pharyngeal strengthening exercises, and Diet toleration management  ?  ? ?

## 2022-01-02 NOTE — Therapy (Signed)
?OUTPATIENT PHYSICAL THERAPY TREATMENT NOTE ? ? ?Patient Name: Cindy [name] ?MRN: [id number] ?DOB:09/28/1932, 88 y.o., female ?Today's Date: 01/02/2022 ? ?PCP: Lawerance Cruel, MD ?REFERRING PROVIDER: Lawerance Cruel, MD ? ? PT End of Session - 01/02/22 1409   ? ? Visit Number 2   ? Number of Visits 9   ? Date for PT Re-Evaluation 01/28/22   ? Authorization Type [employer]   ? PT Start Time 1409   ? PT Stop Time 1450   ? PT Time Calculation (min) 41 min   ? Equipment Utilized During Treatment Gait belt   ? Activity Tolerance Patient tolerated treatment well   ? Behavior During Therapy [facility] for tasks assessed/performed   ? ?  ?  ? ?  ? ? ?Past Medical History:  ?Diagnosis Date  ? Asthma   ? Bronchitis   ? COPD (chronic obstructive pulmonary disease) ([place])   ? Coronary artery disease   ? MI age 64  ? GERD (gastroesophageal reflux disease)   ? Heart attack ([place])   ? Hypercholesteremia   ? Hypertension   ? Iron deficiency anemia   ? used to see hem in [place], [place].   ? Renal insufficiency   ? Thyroid disease   ? ?Past Surgical History:  ?Procedure Laterality Date  ? APPENDECTOMY    ? BREAST BIOPSY Right   ? benign  ? CORONARY ANGIOPLASTY WITH STENT PLACEMENT    ? CORONARY ARTERY BYPASS GRAFT  09/1979  ? LEFT HEART CATH AND CORS/GRAFTS ANGIOGRAPHY N/A 11/27/2018  ? Procedure: LEFT HEART CATH AND CORS/GRAFTS ANGIOGRAPHY;  Surgeon: Lorretta Harp, MD;  Location: [facility];  Service: Cardiovascular;  Laterality: N/A;  ? Moh's Surgeries    ? for squamous cell carcinoma  ? PARTIAL HYSTERECTOMY  10/1978  ? ?Patient Active Problem List  ? Diagnosis Date Noted  ? Septic encephalopathy 12/17/2021  ? Acute respiratory failure with hypoxia ([place]) 12/13/2021  ? Leukocytosis 12/13/2021  ? Acute respiratory failure with hypoxia secondary to sepsis due to pneumonia ([place]) 12/12/2021  ? Essential (primary) hypertension 12/25/2018  ? Carotid artery disease ([place]) 12/25/2018  ? Chest pain of uncertain etiology   ? Unstable  angina ([place]) 11/26/2018  ? Sepsis ([place]) 01/06/2017  ? Thrombocytopenia ([place]) 01/06/2017  ? Lower urinary tract infectious disease 01/06/2017  ? Hypothyroidism 01/06/2017  ? Anemia 01/06/2017  ? Umbilical hernia 76/72/0947  ? Coronary atherosclerosis of native coronary artery 12/11/2013  ? Old MI (myocardial infarction) 12/11/2013  ? History of tobacco use 12/11/2013  ? Angina decubitus ([place]) 12/11/2013  ? Hyperlipidemia 12/11/2013  ? Rhinitis 09/16/2012  ? ILD (interstitial lung disease) ([place]) 05/08/2012  ? Pulmonary nodule, right 05/06/2012  ? Cough 03/20/2012  ? Iron deficiency anemia   ? Chronic kidney disease, stage 3b ([place])   ? ? ?REFERRING DIAG: Other abnormalities of gait and mobility (R26.89);Muscle weakness (generalized) (M62.81)  ? ?THERAPY DIAG:  ?Unsteadiness on feet ? ?Other abnormalities of gait and mobility ? ?Muscle weakness (generalized) ? ?PERTINENT HISTORY: Asthma, bronchitis, COPD, CAD (MI at age 67), HTN, renal insufficiency, and thyroid disease. ? ?PRECAUTIONS: Fall ? ?SUBJECTIVE: Pt states that she is not feeling well today.  She feels weak all over.  They went to son's last night to play board games and she woke up at 11am today which is not like her and she has felt weak ever since.  She has been having changes with her vision for the last 3 months and has  an eye appt next week.   ? ?PAIN:  ?Are you having pain? No ? ?BP at onset of session:  142/62 ?Husband states that they take BP once a week at home and she ranges from 034-742V systolic and 95G-38V on bottom. ? ?Pt requested a minute to rest and water at onset of session. ? ?Today's Treatment (01/03/2022): ?Pt performs static standing in corner with feet together eyes open x30sec-pt hesitates to place feet completely together stating she is "knock-kneed" and unable to do this. ? ?Progressed to feet together, eyes closed x30 w/ inc sway>feet together, eyes open with head nods x30sec> feet together, eyes closed with head turns  x30sec ? ?Progressed to semi-tandem alternating LE placement with EO>EC x30sec each LE in front with increased sway noted especially with eyes closed ? ?Pt at counter for support performing standing marches x20>SLS with counter support x30sec each leg with pelvic facilitation for glut engagement of stance leg ? ?At [facility] pt performs STS 2x8 ? ? Pt completes 345' w/o AD CGA due to sway during walking.  Pt ambulates with decreased speed, adequate arm swing, and narrow BOS.  Trialed SPC in R and L arms x1 lap, but pt exhibitied increased frustration, decreased speed, and inability to sequence gait pattern without continuous one step commands.   ? ?PATIENT EDUCATION: ?Education details: Edu on initial HEP.  Edu on continuing to ambulate without AD at home due to decreased safety during gait training.       ?Person educated: Patient ?Education method: Explanation ?Education comprehension: verbalized understanding ?  ?  ?HOME EXERCISE PROGRAM: ?[id number] ? ?OBJECTIVE:  ?  ?COGNITION: ?Overall cognitive status: Within functional limits for tasks assessed ?            ?SENSATION: ?Light touch: WFL ?  ?  ?  ?  ?POSTURE: rounded shoulders, forward head, and posterior pelvic tilt ?  ?  ?MMT:   ?  ?MMT Right ?12/29/2021 Left ?12/29/2021  ?Hip flexion 4/5 4/5  ?Hip extension      ?Hip abduction      ?Hip adduction      ?Hip internal rotation      ?Hip external rotation      ?Knee flexion 5/5 5/5  ?Knee extension 5/5 5/5  ?Ankle dorsiflexion 5/5 5/5  ?Ankle plantarflexion      ?Ankle inversion      ?Ankle eversion      ?(Blank rows = not tested) ?  ?  ?  ?TRANSFERS: ?Assistive device utilized: None  ?Sit to stand: SBA ?Stand to sit: SBA ?No UE support  ?  ?  ?STAIRS: ?          Level of Assistance: SBA ?          Stair Negotiation Technique: Alternating Pattern  with Bilateral Rails ?          Number of Stairs:4    ?          Height of Stairs: 6     ?Comments: Pt reports having stairs at her grandkids.  ?  ?GAIT: ?Gait pattern: step  through pattern, decreased stride length, and trunk flexed Bilat genu valgum.  ?Distance walked: Clinic distances during eval.  ?Assistive device utilized: None ?Level of assistance: SBA ?  ?  ?FUNCTIONAL TESTs:  ?5 times sit to stand: 13.5 no UE support ?30 seconds chair stand test 11 sit <> stands (above average for age) ?Timed up and go (TUG): 12.35 sec no AD  ?10 meter  walk test: 16.62 seconds = 1.97 ft/sec, limited community ambulator ?  ?  [facility] PT Assessment - 12/29/21 1312   ?  ?    ?     ?  Functional Gait  Assessment  ?  Gait assessed  Yes   ?  Gait Level Surface Walks 20 ft, slow speed, abnormal gait pattern, evidence for imbalance or deviates 10-15 in outside of the 12 in walkway width. Requires more than 7 sec to ambulate 20 ft.   9.69  ?  Change in Gait Speed Able to change speed, demonstrates mild gait deviations, deviates 6-10 in outside of the 12 in walkway width, or no gait deviations, unable to achieve a major change in velocity, or uses a change in velocity, or uses an assistive device.   ?  Gait with Horizontal Head Turns Performs head turns smoothly with slight change in gait velocity (eg, minor disruption to smooth gait path), deviates 6-10 in outside 12 in walkway width, or uses an assistive device.   limited ROM  ?  Gait with Vertical Head Turns Performs task with slight change in gait velocity (eg, minor disruption to smooth gait path), deviates 6 - 10 in outside 12 in walkway width or uses assistive device   limited ROM  ?  Gait and Pivot Turn Pivot turns safely within 3 sec and stops quickly with no loss of balance.   ?  Step Over Obstacle Is able to step over one shoe box (4.5 in total height) but must slow down and adjust steps to clear box safely. May require verbal cueing.   ?  Gait with Narrow Base of Support Ambulates less than 4 steps heel to toe or cannot perform without assistance.   ?  Gait with Eyes Closed Cannot walk 20 ft without assistance, severe gait deviations or  imbalance, deviates greater than 15 in outside 12 in walkway width or will not attempt task.   20.81 sec  ?  Ambulating Backwards Walks 20 ft, slow speed, abnormal gait pattern, evidence for imbalance, deviates 10-15 in outsid

## 2022-01-02 NOTE — Patient Instructions (Addendum)
EMST breathing device: ?Put on nose piece ?Take a big breath in  ?Put the device between your teeth and close your lips tight  ?Breath out as hard as you can  ? ?Goal: 5 sets of 5 repetitions (25 total) for 5 days a week  ? ?Swallow exercises:  ?Hard swallow  ?Swallow as hard as you can  ?You can do this with or without water  ? ?Goal: 3-5 sets of 10 repetitions ? ? ?

## 2022-01-05 ENCOUNTER — Ambulatory Visit: Payer: Medicare (Managed Care) | Admitting: Speech Pathology

## 2022-01-05 ENCOUNTER — Ambulatory Visit: Payer: Medicare (Managed Care) | Admitting: Physical Therapy

## 2022-01-05 ENCOUNTER — Other Ambulatory Visit: Payer: Self-pay

## 2022-01-05 ENCOUNTER — Encounter: Payer: Self-pay | Admitting: Physical Therapy

## 2022-01-05 ENCOUNTER — Ambulatory Visit: Payer: Medicare (Managed Care) | Admitting: Cardiology

## 2022-01-05 DIAGNOSIS — M6281 Muscle weakness (generalized): Secondary | ICD-10-CM

## 2022-01-05 DIAGNOSIS — R2681 Unsteadiness on feet: Secondary | ICD-10-CM

## 2022-01-05 DIAGNOSIS — R2689 Other abnormalities of gait and mobility: Secondary | ICD-10-CM

## 2022-01-05 DIAGNOSIS — R1313 Dysphagia, pharyngeal phase: Secondary | ICD-10-CM

## 2022-01-05 NOTE — Therapy (Signed)
?OUTPATIENT PHYSICAL THERAPY TREATMENT NOTE ? ? ?Patient Name: Cindy [name] ?MRN: [id number] ?DOB:[birth year], 88 y.o., female ?Today's Date: 01/05/2022 ? ?PCP: Lawerance Cruel, MD ?REFERRING PROVIDER: Lawerance Cruel, MD ? ? PT End of Session - 01/05/22 1238   ? ? Visit Number 3   ? Number of Visits 9   ? Date for PT Re-Evaluation 01/28/22   ? Authorization Type [employer]   ? PT Start Time 1233   ? PT Stop Time 1315   ? PT Time Calculation (min) 42 min   ? Equipment Utilized During Treatment Gait belt   ? Activity Tolerance Patient tolerated treatment well;Patient limited by fatigue   ? Behavior During Therapy [facility] for tasks assessed/performed   ? ?  ?  ? ?  ? ? ?Past Medical History:  ?Diagnosis Date  ? Asthma   ? Bronchitis   ? COPD (chronic obstructive pulmonary disease) ([place])   ? Coronary artery disease   ? MI age 64  ? GERD (gastroesophageal reflux disease)   ? Heart attack ([place])   ? Hypercholesteremia   ? Hypertension   ? Iron deficiency anemia   ? used to see hem in [place], [place].   ? Renal insufficiency   ? Thyroid disease   ? ?Past Surgical History:  ?Procedure Laterality Date  ? APPENDECTOMY    ? BREAST BIOPSY Right   ? benign  ? CORONARY ANGIOPLASTY WITH STENT PLACEMENT    ? CORONARY ARTERY BYPASS GRAFT  09/1979  ? LEFT HEART CATH AND CORS/GRAFTS ANGIOGRAPHY N/A 11/27/2018  ? Procedure: LEFT HEART CATH AND CORS/GRAFTS ANGIOGRAPHY;  Surgeon: Lorretta Harp, MD;  Location: [facility];  Service: Cardiovascular;  Laterality: N/A;  ? Moh's Surgeries    ? for squamous cell carcinoma  ? PARTIAL HYSTERECTOMY  10/1978  ? ?Patient Active Problem List  ? Diagnosis Date Noted  ? Septic encephalopathy 12/17/2021  ? Acute respiratory failure with hypoxia ([place]) 12/13/2021  ? Leukocytosis 12/13/2021  ? Acute respiratory failure with hypoxia secondary to sepsis due to pneumonia ([place]) 12/12/2021  ? Essential (primary) hypertension 12/25/2018  ? Carotid artery disease ([place]) 12/25/2018  ? Chest pain of  uncertain etiology   ? Unstable angina ([place]) 11/26/2018  ? Sepsis ([place]) 01/06/2017  ? Thrombocytopenia ([place]) 01/06/2017  ? Lower urinary tract infectious disease 01/06/2017  ? Hypothyroidism 01/06/2017  ? Anemia 01/06/2017  ? Umbilical hernia 76/16/0737  ? Coronary atherosclerosis of native coronary artery 12/11/2013  ? Old MI (myocardial infarction) 12/11/2013  ? History of tobacco use 12/11/2013  ? Angina decubitus ([place]) 12/11/2013  ? Hyperlipidemia 12/11/2013  ? Rhinitis 09/16/2012  ? ILD (interstitial lung disease) ([place]) 05/08/2012  ? Pulmonary nodule, right 05/06/2012  ? Cough 03/20/2012  ? Iron deficiency anemia   ? Chronic kidney disease, stage 3b ([place])   ? ? ?REFERRING DIAG: Other abnormalities of gait and mobility (R26.89);Muscle weakness (generalized) (M62.81)  ? ?THERAPY DIAG:  ?Other abnormalities of gait and mobility ? ?Muscle weakness (generalized) ? ?Unsteadiness on feet ? ?PERTINENT HISTORY: Asthma, bronchitis, COPD, CAD (MI at age 33), HTN, renal insufficiency, and thyroid disease. ? ?PRECAUTIONS: Fall ? ?SUBJECTIVE: Pt states that she feels better today and slept better last night.  ? ?PAIN:  ?Are you having pain? No-sore from HEP and additional exercise that she did on her own ? ?BP at onset of session L arm in sitting:  146/62 ?From note prior:  Husband states that they take BP once a week at  home and she ranges from 700-174B systolic and 44H-67R on bottom. ? ?Today's Treatment: ?NuStep using BUE/BLE to promote reciprocal movement of upper body, aerobic tolerance, and LE strengthening; L1x42mns>L2x2mins>L3x3mins; goal of 40 steps per min, achieved average of  ? ?Trialed SPC 230' in gym on R today with improved carryover and decreased use of one step commands.  Pt demos mechanical gait pattern with cane.  She continues to be frustrated by idea of use of SPC outside clinic, but may bring her own in to practice for use when walking in her neighborhood.  PT did not recommend home use of SPC  yet. ? ?Backwards walking>semi-tandem x2 trials> counting down from 20 by 2's when walking backwards x2trials ? ?Ball toss with alt LE elevated on airex, tactile cuing for weight shift onto leg on airex to increase challenge.  ? ?PATIENT EDUCATION: ?Education details:  Edu on continuing to ambulate without AD at home due to decreased safety during gait training with acknowledgements of benefits of continued training with it in clinic for use in unfamiliar environments in the future.       ?Person educated: Patient ?Education method: Explanation ?Education comprehension: verbalized understanding ?  ?  ?HOME EXERCISE PROGRAM: ?[id number]? ?OBJECTIVE:  ?  ?COGNITION: ?Overall cognitive status: Within functional limits for tasks assessed ?            ?SENSATION: ?Light touch: WFL ?  ?  ?  ?  ?POSTURE: rounded shoulders, forward head, and posterior pelvic tilt ?  ?  ?MMT:   ?  ?MMT Right ?12/29/2021 Left ?12/29/2021  ?Hip flexion 4/5 4/5  ?Hip extension      ?Hip abduction      ?Hip adduction      ?Hip internal rotation      ?Hip external rotation      ?Knee flexion 5/5 5/5  ?Knee extension 5/5 5/5  ?Ankle dorsiflexion 5/5 5/5  ?Ankle plantarflexion      ?Ankle inversion      ?Ankle eversion      ?(Blank rows = not tested) ?  ?  ?  ?TRANSFERS: ?Assistive device utilized: None  ?Sit to stand: SBA ?Stand to sit: SBA ?No UE support  ?  ?  ?STAIRS: ?          Level of Assistance: SBA ?          Stair Negotiation Technique: Alternating Pattern  with Bilateral Rails ?          Number of Stairs:4    ?          Height of Stairs: 6     ?Comments: Pt reports having stairs at her grandkids.  ?  ?GAIT: ?Gait pattern: step through pattern, decreased stride length, and trunk flexed Bilat genu valgum.  ?Distance walked: Clinic distances during eval.  ?Assistive device utilized: None ?Level of assistance: SBA ?  ?  ?FUNCTIONAL TESTs:  ?5 times sit to stand: 13.5 no UE support ?30 seconds chair stand test 11 sit <> stands (above average for  age) ?Timed up and go (TUG): 12.35 sec no AD  ?10 meter walk test: 16.62 seconds = 1.97 ft/sec, limited community ambulator ?  ?  [facility] 1312   ?  ?    ?     ?  Functional Gait  Assessment  ?  Gait assessed  Yes   ?  Gait Level Surface Walks 20 ft, slow speed, abnormal gait pattern, evidence for imbalance or  deviates 10-15 in outside of the 12 in walkway width. Requires more than 7 sec to ambulate 20 ft.   9.69  ?  Change in Gait Speed Able to change speed, demonstrates mild gait deviations, deviates 6-10 in outside of the 12 in walkway width, or no gait deviations, unable to achieve a major change in velocity, or uses a change in velocity, or uses an assistive device.   ?  Gait with Horizontal Head Turns Performs head turns smoothly with slight change in gait velocity (eg, minor disruption to smooth gait path), deviates 6-10 in outside 12 in walkway width, or uses an assistive device.   limited ROM  ?  Gait with Vertical Head Turns Performs task with slight change in gait velocity (eg, minor disruption to smooth gait path), deviates 6 - 10 in outside 12 in walkway width or uses assistive device   limited ROM  ?  Gait and Pivot Turn Pivot turns safely within 3 sec and stops quickly with no loss of balance.   ?  Step Over Obstacle Is able to step over one shoe box (4.5 in total height) but must slow down and adjust steps to clear box safely. May require verbal cueing.   ?  Gait with Narrow Base of Support Ambulates less than 4 steps heel to toe or cannot perform without assistance.   ?  Gait with Eyes Closed Cannot walk 20 ft without assistance, severe gait deviations or imbalance, deviates greater than 15 in outside 12 in walkway width or will not attempt task.   20.81 sec  ?  Ambulating Backwards Walks 20 ft, slow speed, abnormal gait pattern, evidence for imbalance, deviates 10-15 in outside 12 in walkway width.   25.4 sec  ?  Steps Alternating feet, must use rail.   ?  Total Score 14   ?   FGA comment: 14/30 = high fall risk   ?  ?   ?  ?  ?   ?  ?  ?PATIENT SURVEYS:  ?N/A ?  ?ASSESSMENT: ?  ?CLINICAL IMPRESSION: ?Skilled session focused on endurance training with NuStep and high level static balance c

## 2022-01-05 NOTE — Therapy (Signed)
?OUTPATIENT SPEECH LANGUAGE PATHOLOGY TREATMENT NOTE ? ? ?Patient Name: Cindy [name] ?MRN: [id number] ?DOB:September 02, 1932, 88 y.o., female ?Today's Date: 01/05/2022 ? ?PCP: Lawerance Cruel, MD ?REFERRING PROVIDER: Lawerance Cruel, MD ? ? End of Session - 01/05/22 1310   ? ? Visit Number 3   ? Number of Visits 13   ? Date for SLP Re-Evaluation 02/09/22   ? Authorization Type [employer]   ? SLP Start Time 0865   ? SLP Stop Time  1400   ? SLP Time Calculation (min) 39 min   ? Activity Tolerance Patient tolerated treatment well   ? ?  ?  ? ?  ? ? ?Past Medical History:  ?Diagnosis Date  ? Asthma   ? Bronchitis   ? COPD (chronic obstructive pulmonary disease) ([place])   ? Coronary artery disease   ? MI age 88  ? GERD (gastroesophageal reflux disease)   ? Heart attack ([place])   ? Hypercholesteremia   ? Hypertension   ? Iron deficiency anemia   ? used to see hem in [place], [place].   ? Renal insufficiency   ? Thyroid disease   ? ?Past Surgical History:  ?Procedure Laterality Date  ? APPENDECTOMY    ? BREAST BIOPSY Right   ? benign  ? CORONARY ANGIOPLASTY WITH STENT PLACEMENT    ? CORONARY ARTERY BYPASS GRAFT  09/1979  ? LEFT HEART CATH AND CORS/GRAFTS ANGIOGRAPHY N/A 11/27/2018  ? Procedure: LEFT HEART CATH AND CORS/GRAFTS ANGIOGRAPHY;  Surgeon: Lorretta Harp, MD;  Location: [facility];  Service: Cardiovascular;  Laterality: N/A;  ? Moh's Surgeries    ? for squamous cell carcinoma  ? PARTIAL HYSTERECTOMY  10/1978  ? ?Patient Active Problem List  ? Diagnosis Date Noted  ? Septic encephalopathy 12/17/2021  ? Acute respiratory failure with hypoxia ([place]) 12/13/2021  ? Leukocytosis 12/13/2021  ? Acute respiratory failure with hypoxia secondary to sepsis due to pneumonia ([place]) 12/12/2021  ? Essential (primary) hypertension 12/25/2018  ? Carotid artery disease ([place]) 12/25/2018  ? Chest pain of uncertain etiology   ? Unstable angina ([place]) 11/26/2018  ? Sepsis ([place]) 01/06/2017  ? Thrombocytopenia ([place]) 01/06/2017  ? Lower  urinary tract infectious disease 01/06/2017  ? Hypothyroidism 01/06/2017  ? Anemia 01/06/2017  ? Umbilical hernia 78/46/9629  ? Coronary atherosclerosis of native coronary artery 12/11/2013  ? Old MI (myocardial infarction) 12/11/2013  ? History of tobacco use 12/11/2013  ? Angina decubitus ([place]) 12/11/2013  ? Hyperlipidemia 12/11/2013  ? Rhinitis 09/16/2012  ? ILD (interstitial lung disease) ([place]) 05/08/2012  ? Pulmonary nodule, right 05/06/2012  ? Cough 03/20/2012  ? Iron deficiency anemia   ? Chronic kidney disease, stage 3b ([place])   ? ? ?ONSET DATE: 12-12-21 ? ?REFERRING DIAG: R13.10 (ICD-10-CM) - Dysphagia, unspecified  ? ?THERAPY DIAG: Dysphagia, pharyngeal phase ? ?SUBJECTIVE: "I've been doing them." Re: swallowing exercises  ? ?PAIN:  ?Are you having pain? Yes ?NPRS scale: 7/10 ?Pain location: neck ? ?OBJECTIVE:  ? ?DIAGNOSTIC FINDINGS (MBSS): "Pt has a mild pharyngeal dysphagia, characterized primarily by mistimed swallowing and mildly reduced epiglottic inversion. Trace penetration as deep as to the vocal folds and intermittent aspiration occurs with thin liquids. She cannot clear aspirates with reflexive or volitional coughing despite small volume. Aspiration primarily occurs when using a straw and is not eliminated with use of a chin tuck. Pt is only able to tuck her chin a little bit due to reports of neck pain, but even subtle movement that  she can tolerate without pain is adequate to increase airway protection when also cued for small, single cup sips. Although penetration still occurs, it is more shallow and mostly clears upon completion of the swallow. Recommend regular solids and thin liquids by cup via small, single sips and use of a chin tuck as tolerated." ?  ? SLP Diet Recommendations: Regular solids;Thin liquid ? Liquid Administration via: Cup;No straw ? Medication Administration: Whole meds with puree ? Supervision: Patient able to self feed;Intermittent supervision to cue for compensatory  strategies ? Compensations: Slow rate;Small sips/bites;Clear throat intermittently;Chin tuck;Other (Comment) (chew food well; one sip at a time) ? Postural Changes: Seated upright at 90 degrees ? Oral Care Recommendations: Oral care BID ?  ?  ?RECOMMENDATIONS FROM OBJECTIVE SWALLOW STUDY (MBSS/FEES):   ?Objective swallow impairments: mistimed swallow initiation and mildly reduced epiglottic inversion  ?Objective recommended compensations: No straw, Chin Tuck, Intermittent throat clears ?  ?  ?TODAY'S TREATMENT:  ?01-05-22 ?Provide additional instruction and coaching for completion of EMST and effortful swallow. Pt reports EMST effort has decreased as she practices, will continue at current setting until next ST session at which time pressure may be increased if indicated. Performed x17 effortful swallows with rare min A for completion. Reviewed swallowing compensations recommended to increase pt's swallow safety and general aspiration precautions. Pt tells ST she has not used a straw since MBSS.  With thin liquids, no overt s/sx of aspiration on this date.  ? ?01-02-22 ?Initiated education and training of swallow exercises (Effortful swallow) and Expiratory Muscle Strength Training (EMST) to target pharyngeal deficits and reduced cough strength to maximize swallow function and safety. SLP conducted assessment of MEP for EMST. Pt's maximum expiratory pressure today was 42 cm H2O. EMST 150 device was started at 75% MEP at 31 cm H2O. SLP provided demonstration and verbal instruction to utilize EMST device. Pt completed 5 repetitions over 2 sets during our session with usual fading to rare cues for short, strong exhalations. HEP initiated for RMST (5 sets of 5 reps for 5 days/week) and effortful swallows of saliva (due to decreased bolus coordination with water). Pt consumed small, single sips of thin liquids via cup with coughing episode x1 (when trialing effortful swallow).  ? ?12-29-21 ?Extensive education re: MBSS  results, pharyngeal deficits, and implications (aspiration PNA, rehospitalization) conducted with patient and husband. Pt exhibited reduced understanding of impact of swallow deficits on safety and overall health. Husband exhibited improved understanding and attempted to rephrase for patient without success. Despite being hesitant of initiation of swallow therapy, pt agreeable to trial.  ?  ?  ?PATIENT EDUCATION: ?Education details: see above ?Person educated: Patient and Spouse ?Education method: Explanation, Demonstration, Verbal cues, Handout ?Education comprehension: verbalized understanding/demonstration and needs further education ?  ?  ?ASSESSMENT: ?  ?CLINICAL IMPRESSION: ?Patient is a 88 y.o. female who was seen today for pharyngeal dysphagia. Pt recently hospitalized with sepsis and hypoxia secondary to RLL PNA. MBSS revealed mild pharyngeal dysphagia with recommendation for no straw and chin tuck. Initiated education and training for EMST and effortful swallows to target pharyngeal deficits and maximize swallow function/cough production.   ?  ?OBJECTIVE IMPAIRMENTS include dysphagia. These impairments are limiting patient from safety when swallowing. ?Factors affecting potential to achieve goals and functional outcome are ability to learn/carryover information and cooperation/participation level. Patient will benefit from skilled SLP services to address above impairments and improve overall function. ?  ?REHAB POTENTIAL: Fair - cognitive component ?  ?  ?GOALS: ?Goals reviewed  with patient? Yes ?  ?SHORT TERM GOALS: Target date: 01/26/2022 ?  ?Pt will verbalize and/or demonstrate recommended compensatory swallow strategies to reduce risk of aspiration given rare min A over 2 sessions  ?Baseline:  ?Goal status: IN PROGRESS ?  ?2.  Pt will complete recommended swallow exercises to target pharyngeal deficits given usual mod A over 2 sessions  ?Baseline:  ?Goal status: IN PROGRESS ?  ?3.  Pt will participate in  RMST exercises to maximize cough strength given usual mod A over 2 sessions  ?Baseline:  ?Goal status: IN PROGRESS ?  ?LONG TERM GOALS: Target date: 02/09/2022 ?  ?Pt will carryover recommended swallow precaution

## 2022-01-08 DIAGNOSIS — H353121 Nonexudative age-related macular degeneration, left eye, early dry stage: Secondary | ICD-10-CM | POA: Diagnosis not present

## 2022-01-08 DIAGNOSIS — Z961 Presence of intraocular lens: Secondary | ICD-10-CM | POA: Diagnosis not present

## 2022-01-08 DIAGNOSIS — H53001 Unspecified amblyopia, right eye: Secondary | ICD-10-CM | POA: Diagnosis not present

## 2022-01-08 DIAGNOSIS — H353112 Nonexudative age-related macular degeneration, right eye, intermediate dry stage: Secondary | ICD-10-CM | POA: Diagnosis not present

## 2022-01-08 DIAGNOSIS — H524 Presbyopia: Secondary | ICD-10-CM | POA: Diagnosis not present

## 2022-01-09 ENCOUNTER — Ambulatory Visit: Payer: Medicare (Managed Care)

## 2022-01-09 ENCOUNTER — Other Ambulatory Visit: Payer: Self-pay

## 2022-01-09 ENCOUNTER — Encounter: Payer: Self-pay | Admitting: Physical Therapy

## 2022-01-09 ENCOUNTER — Ambulatory Visit: Payer: Medicare (Managed Care) | Admitting: Physical Therapy

## 2022-01-09 DIAGNOSIS — R1313 Dysphagia, pharyngeal phase: Secondary | ICD-10-CM

## 2022-01-09 DIAGNOSIS — R2689 Other abnormalities of gait and mobility: Secondary | ICD-10-CM

## 2022-01-09 DIAGNOSIS — R2681 Unsteadiness on feet: Secondary | ICD-10-CM

## 2022-01-09 DIAGNOSIS — M6281 Muscle weakness (generalized): Secondary | ICD-10-CM

## 2022-01-09 NOTE — Therapy (Signed)
?OUTPATIENT SPEECH LANGUAGE PATHOLOGY TREATMENT NOTE ? ? ?Patient Name: Cindy [name] ?MRN: [id number] ?DOB:Jul 16, 1932, 88 y.o., female ?Today's Date: 01/09/2022 ? ?PCP: Lawerance Cruel, MD ?REFERRING PROVIDER: Lawerance Cruel, MD ? ? End of Session - 01/09/22 1049   ? ? Visit Number 4   ? Number of Visits 13   ? Date for SLP Re-Evaluation 02/09/22   ? Authorization Type [employer]   ? SLP Start Time 1100   ? SLP Stop Time  1145   ? SLP Time Calculation (min) 45 min   ? Activity Tolerance Patient tolerated treatment well   ? ?  ?  ? ?  ? ? ?Past Medical History:  ?Diagnosis Date  ? Asthma   ? Bronchitis   ? COPD (chronic obstructive pulmonary disease) ([place])   ? Coronary artery disease   ? MI age 12  ? GERD (gastroesophageal reflux disease)   ? Heart attack ([place])   ? Hypercholesteremia   ? Hypertension   ? Iron deficiency anemia   ? used to see hem in [place], [place].   ? Renal insufficiency   ? Thyroid disease   ? ?Past Surgical History:  ?Procedure Laterality Date  ? APPENDECTOMY    ? BREAST BIOPSY Right   ? benign  ? CORONARY ANGIOPLASTY WITH STENT PLACEMENT    ? CORONARY ARTERY BYPASS GRAFT  09/1979  ? LEFT HEART CATH AND CORS/GRAFTS ANGIOGRAPHY N/A 11/27/2018  ? Procedure: LEFT HEART CATH AND CORS/GRAFTS ANGIOGRAPHY;  Surgeon: Lorretta Harp, MD;  Location: [facility];  Service: Cardiovascular;  Laterality: N/A;  ? Moh's Surgeries    ? for squamous cell carcinoma  ? PARTIAL HYSTERECTOMY  10/1978  ? ?Patient Active Problem List  ? Diagnosis Date Noted  ? Septic encephalopathy 12/17/2021  ? Acute respiratory failure with hypoxia ([place]) 12/13/2021  ? Leukocytosis 12/13/2021  ? Acute respiratory failure with hypoxia secondary to sepsis due to pneumonia ([place]) 12/12/2021  ? Essential (primary) hypertension 12/25/2018  ? Carotid artery disease ([place]) 12/25/2018  ? Chest pain of uncertain etiology   ? Unstable angina ([place]) 11/26/2018  ? Sepsis ([place]) 01/06/2017  ? Thrombocytopenia ([place]) 01/06/2017  ? Lower  urinary tract infectious disease 01/06/2017  ? Hypothyroidism 01/06/2017  ? Anemia 01/06/2017  ? Umbilical hernia 10/30/3233  ? Coronary atherosclerosis of native coronary artery 12/11/2013  ? Old MI (myocardial infarction) 12/11/2013  ? History of tobacco use 12/11/2013  ? Angina decubitus ([place]) 12/11/2013  ? Hyperlipidemia 12/11/2013  ? Rhinitis 09/16/2012  ? ILD (interstitial lung disease) ([place]) 05/08/2012  ? Pulmonary nodule, right 05/06/2012  ? Cough 03/20/2012  ? Iron deficiency anemia   ? Chronic kidney disease, stage 3b ([place])   ? ? ?ONSET DATE: 12-12-21 ? ?REFERRING DIAG: R13.10 (ICD-10-CM) - Dysphagia, unspecified  ? ?THERAPY DIAG: Dysphagia, pharyngeal phase ? ?SUBJECTIVE: "I have too many appointments. I've been too busy to use it (RMST)" ? ?PAIN:  ?Are you having pain? Yes ?NPRS scale: 8/10 ?Pain location: neck/back ? ?OBJECTIVE:  ? ?DIAGNOSTIC FINDINGS (MBSS): "Pt has a mild pharyngeal dysphagia, characterized primarily by mistimed swallowing and mildly reduced epiglottic inversion. Trace penetration as deep as to the vocal folds and intermittent aspiration occurs with thin liquids. She cannot clear aspirates with reflexive or volitional coughing despite small volume. Aspiration primarily occurs when using a straw and is not eliminated with use of a chin tuck. Pt is only able to tuck her chin a little bit due to reports of neck pain,  but even subtle movement that she can tolerate without pain is adequate to increase airway protection when also cued for small, single cup sips. Although penetration still occurs, it is more shallow and mostly clears upon completion of the swallow. Recommend regular solids and thin liquids by cup via small, single sips and use of a chin tuck as tolerated." ?  ? SLP Diet Recommendations: Regular solids;Thin liquid ? Liquid Administration via: Cup;No straw ? Medication Administration: Whole meds with puree ? Supervision: Patient able to self feed;Intermittent supervision to  cue for compensatory strategies ? Compensations: Slow rate;Small sips/bites;Clear throat intermittently;Chin tuck;Other (Comment) (chew food well; one sip at a time) ? Postural Changes: Seated upright at 90 degrees ? Oral Care Recommendations: Oral care BID ?  ?  ?RECOMMENDATIONS FROM OBJECTIVE SWALLOW STUDY (MBSS/FEES):   ?Objective swallow impairments: mistimed swallow initiation and mildly reduced epiglottic inversion  ?Objective recommended compensations: No straw, Chin Tuck, Intermittent throat clears ?  ?  ?TODAY'S TREATMENT:  ?01-09-22 ?Conducted ongoing education and training for EMST and swallow exercises. SLP re-assessed EMST setting for possible resistance advancement, in which current setting deemed adequate based on clinical observations and patient reports (still challenging). Pt completed 25 total reps (5 sets of 5 reps) with occasional min A for effort and accuracy. Pt completed effortful swallows x20 today with rare min A. SLP trialed effortful swallow with cup sip x1 resulting in cough. No other overt s/sx of aspiration with cup sips of thin liquids. Husband reported coughing has decreased indicating "not as much as used to." [employer] swallow today to target base of tongue, in which pt able to complete x5 in session with extra time.  ? ?01-05-22 ?Provide additional instruction and coaching for completion of EMST and effortful swallow. Pt reports EMST effort has decreased as she practices, will continue at current setting until next ST session at which time pressure may be increased if indicated. Performed x17 effortful swallows with rare min A for completion. Reviewed swallowing compensations recommended to increase pt's swallow safety and general aspiration precautions. Pt tells ST she has not used a straw since MBSS.  With thin liquids, no overt s/sx of aspiration on this date.  ? ?01-02-22 ?Initiated education and training of swallow exercises (Effortful swallow) and Expiratory Muscle  Strength Training (EMST) to target pharyngeal deficits and reduced cough strength to maximize swallow function and safety. SLP conducted assessment of MEP for EMST. Pt's maximum expiratory pressure today was 42 cm H2O. EMST 150 device was started at 75% MEP at 31 cm H2O. SLP provided demonstration and verbal instruction to utilize EMST device. Pt completed 5 repetitions over 2 sets during our session with usual fading to rare cues for short, strong exhalations. HEP initiated for RMST (5 sets of 5 reps for 5 days/week) and effortful swallows of saliva (due to decreased bolus coordination with water). Pt consumed small, single sips of thin liquids via cup with coughing episode x1 (when trialing effortful swallow).  ? ?  ?PATIENT EDUCATION: ?Education details: see above ?Person educated: Patient and Spouse ?Education method: Explanation, Demonstration, Verbal cues, Handout ?Education comprehension: verbalized understanding/demonstration and needs further education ?  ?  ?ASSESSMENT: ?  ?CLINICAL IMPRESSION: ?Patient is a 88 y.o. female who was seen today for pharyngeal dysphagia. Pt recently hospitalized with sepsis and hypoxia secondary to RLL PNA. MBSS revealed mild pharyngeal dysphagia with recommendation for no straw and chin tuck. Conducted ongoing education and training for EMST and effortful swallows to target pharyngeal deficits and maximize  swallow function/cough production.   ?  ?OBJECTIVE IMPAIRMENTS include dysphagia. These impairments are limiting patient from safety when swallowing. ?Factors affecting potential to achieve goals and functional outcome are ability to learn/carryover information and cooperation/participation level. Patient will benefit from skilled SLP services to address above impairments and improve overall function. ?  ?REHAB POTENTIAL: Fair - cognitive component ?  ?  ?GOALS: ?Goals reviewed with patient? Yes ?  ?SHORT TERM GOALS: Target date: 01/26/2022 ?  ?Pt will verbalize and/or  demonstrate recommended compensatory swallow strategies to reduce risk of aspiration given rare min A over 2 sessions  ?Baseline: 01-09-22 ?Goal status: IN PROGRESS ?  ?2.  Pt will complete recommended swallow exercises

## 2022-01-09 NOTE — Patient Instructions (Signed)
Cindy [name] ("Tongue Out [name]"): ?- [name] with your tongue sticking out ?- Stick tongue out past the lips - -gently bite tongue to hold it steady, and [name] ?- Repeat 25-30 times a day (let's aim for 10 per day at this time) ?*use a wet spoon if your mouth gets dry* ? ? ? ?

## 2022-01-09 NOTE — Therapy (Signed)
?OUTPATIENT PHYSICAL THERAPY TREATMENT NOTE ? ? ?Patient Name: Cindy [name] ?MRN: [id number] ?DOB:[birth year], 88 y.o., female ?Today's Date: 01/09/2022 ? ?PCP: Lawerance Cruel, MD ?REFERRING PROVIDER: Lawerance Cruel, MD ? ? PT End of Session - 01/09/22 1157   ? ? Visit Number 4   ? Number of Visits 9   ? Date for PT Re-Evaluation 01/28/22   ? Authorization Type [employer]   ? PT Start Time 1151   ? PT Stop Time 1234   ? PT Time Calculation (min) 43 min   ? Equipment Utilized During Treatment Gait belt   ? Activity Tolerance Patient tolerated treatment well;Patient limited by fatigue   ? Behavior During Therapy [facility] for tasks assessed/performed   ? ?  ?  ? ?  ? ? ?Past Medical History:  ?Diagnosis Date  ? Asthma   ? Bronchitis   ? COPD (chronic obstructive pulmonary disease) ([place])   ? Coronary artery disease   ? MI age 20  ? GERD (gastroesophageal reflux disease)   ? Heart attack ([place])   ? Hypercholesteremia   ? Hypertension   ? Iron deficiency anemia   ? used to see hem in [place], [place].   ? Renal insufficiency   ? Thyroid disease   ? ?Past Surgical History:  ?Procedure Laterality Date  ? APPENDECTOMY    ? BREAST BIOPSY Right   ? benign  ? CORONARY ANGIOPLASTY WITH STENT PLACEMENT    ? CORONARY ARTERY BYPASS GRAFT  09/1979  ? LEFT HEART CATH AND CORS/GRAFTS ANGIOGRAPHY N/A 11/27/2018  ? Procedure: LEFT HEART CATH AND CORS/GRAFTS ANGIOGRAPHY;  Surgeon: Lorretta Harp, MD;  Location: [facility];  Service: Cardiovascular;  Laterality: N/A;  ? Moh's Surgeries    ? for squamous cell carcinoma  ? PARTIAL HYSTERECTOMY  10/1978  ? ?Patient Active Problem List  ? Diagnosis Date Noted  ? Septic encephalopathy 12/17/2021  ? Acute respiratory failure with hypoxia ([place]) 12/13/2021  ? Leukocytosis 12/13/2021  ? Acute respiratory failure with hypoxia secondary to sepsis due to pneumonia ([place]) 12/12/2021  ? Essential (primary) hypertension 12/25/2018  ? Carotid artery disease ([place]) 12/25/2018  ? Chest pain of  uncertain etiology   ? Unstable angina ([place]) 11/26/2018  ? Sepsis ([place]) 01/06/2017  ? Thrombocytopenia ([place]) 01/06/2017  ? Lower urinary tract infectious disease 01/06/2017  ? Hypothyroidism 01/06/2017  ? Anemia 01/06/2017  ? Umbilical hernia 60/63/0160  ? Coronary atherosclerosis of native coronary artery 12/11/2013  ? Old MI (myocardial infarction) 12/11/2013  ? History of tobacco use 12/11/2013  ? Angina decubitus ([place]) 12/11/2013  ? Hyperlipidemia 12/11/2013  ? Rhinitis 09/16/2012  ? ILD (interstitial lung disease) ([place]) 05/08/2012  ? Pulmonary nodule, right 05/06/2012  ? Cough 03/20/2012  ? Iron deficiency anemia   ? Chronic kidney disease, stage 3b ([place])   ? ? ?REFERRING DIAG: Other abnormalities of gait and mobility (R26.89);Muscle weakness (generalized) (M62.81)  ? ?THERAPY DIAG:  ?Other abnormalities of gait and mobility ? ?Muscle weakness (generalized) ? ?Unsteadiness on feet ? ?PERTINENT HISTORY: Asthma, bronchitis, COPD, CAD (MI at age 9), HTN, renal insufficiency, and thyroid disease. ? ?PRECAUTIONS: Fall ? ?SUBJECTIVE: Pt had eyes dilated yesterday and reports that her vision would not clear up until late yesterday when she used her eyedrops.  Vision is better today.  Hasn't been able to do HEP due to appts and chores past couple days.  Able to walk all through [employer], [employer], and [place] and unload and put  away groceries without issue and feels she would not have been able to do that prior to therapy. ? ?PAIN:  ?Are you having pain? Yes: NPRS scale: 8/10 ?Pain location: neck ?Pain description: sore ?Aggravating factors: hurts with all movements ?Relieving factors:   ? ?Today's Treatment: ?BP at onset of session R arm in sitting:  165/67, pulse 60bpm; second BP after 2 min quiet rest 164/68, 59bpm.   PT continues w/ caution as pt is also asymptomatic, no headache, diaphoresis, or otherwise. ?From notes prior:  Husband states that they take BP once a week at home and she ranges from  229-798X systolic and 21J-94R on bottom. ? ?NuStep using BUE/BLE to promote reciprocal movement of upper body, aerobic tolerance, and LE strengthening; L2x32mns>L3x4mins; goal of 55 steps per min, achieved average of ... steps per minute ? ?Stair step ups 2x8 on 6" step for functional LE strengthening, CGA, using R rail for balance, pt has difficulty sequencing LLE step up and RLE step down, but does better with RLE step up and LLE step down.  Cued for LE clearance instead of drag off step during step down. ? ?Pt performs bimanual carry task with empty crate scanning for cones, collecting from low<>high including retreival from floor<>chest height, no LOB during ambulation, SBA.  Pt misses single cone on far left with increased cuing required throughout for improved visual scanning to left. ? ?BP in sitting LUE 167/62 following activity at end of session. ?  ? ?PATIENT EDUCATION: ?Education details:  Edu on monitoring BP at home and safe limits for exercise.       ?Person educated: Patient & Spouse ?Education method: Explanation ?Education comprehension: verbalized understanding ?  ?  ?HOME EXERCISE PROGRAM: ?[id number]? ?OBJECTIVE:  ?  ?COGNITION: ?Overall cognitive status: Within functional limits for tasks assessed ?            ?SENSATION: ?Light touch: WFL ?  ?  ?  ?  ?POSTURE: rounded shoulders, forward head, and posterior pelvic tilt ?  ?  ?MMT:   ?  ?MMT Right ?12/29/2021 Left ?12/29/2021  ?Hip flexion 4/5 4/5  ?Hip extension      ?Hip abduction      ?Hip adduction      ?Hip internal rotation      ?Hip external rotation      ?Knee flexion 5/5 5/5  ?Knee extension 5/5 5/5  ?Ankle dorsiflexion 5/5 5/5  ?Ankle plantarflexion      ?Ankle inversion      ?Ankle eversion      ?(Blank rows = not tested) ?  ?  ?  ?TRANSFERS: ?Assistive device utilized: None  ?Sit to stand: SBA ?Stand to sit: SBA ?No UE support  ?  ?  ?STAIRS: ?          Level of Assistance: SBA ?          Stair Negotiation Technique: Alternating Pattern   with Bilateral Rails ?          Number of Stairs:4    ?          Height of Stairs: 6     ?Comments: Pt reports having stairs at her grandkids.  ?  ?GAIT: ?Gait pattern: step through pattern, decreased stride length, and trunk flexed Bilat genu valgum.  ?Distance walked: Clinic distances during eval.  ?Assistive device utilized: None ?Level of assistance: SBA ?  ?  ?FUNCTIONAL TESTs:  ?5 times sit to stand: 13.5 no UE support ?30 seconds chair  stand test 11 sit <> stands (above average for age) ?Timed up and go (TUG): 12.35 sec no AD  ?10 meter walk test: 16.62 seconds = 1.97 ft/sec, limited community ambulator ?  ?  [facility] PT Assessment - 12/29/21 1312   ?  ?    ?     ?  Functional Gait  Assessment  ?  Gait assessed  Yes   ?  Gait Level Surface Walks 20 ft, slow speed, abnormal gait pattern, evidence for imbalance or deviates 10-15 in outside of the 12 in walkway width. Requires more than 7 sec to ambulate 20 ft.   9.69  ?  Change in Gait Speed Able to change speed, demonstrates mild gait deviations, deviates 6-10 in outside of the 12 in walkway width, or no gait deviations, unable to achieve a major change in velocity, or uses a change in velocity, or uses an assistive device.   ?  Gait with Horizontal Head Turns Performs head turns smoothly with slight change in gait velocity (eg, minor disruption to smooth gait path), deviates 6-10 in outside 12 in walkway width, or uses an assistive device.   limited ROM  ?  Gait with Vertical Head Turns Performs task with slight change in gait velocity (eg, minor disruption to smooth gait path), deviates 6 - 10 in outside 12 in walkway width or uses assistive device   limited ROM  ?  Gait and Pivot Turn Pivot turns safely within 3 sec and stops quickly with no loss of balance.   ?  Step Over Obstacle Is able to step over one shoe box (4.5 in total height) but must slow down and adjust steps to clear box safely. May require verbal cueing.   ?  Gait with Narrow Base of Support  Ambulates less than 4 steps heel to toe or cannot perform without assistance.   ?  Gait with Eyes Closed Cannot walk 20 ft without assistance, severe gait deviations or imbalance, deviates greater than 15 in outside 1

## 2022-01-12 ENCOUNTER — Other Ambulatory Visit: Payer: Self-pay

## 2022-01-12 ENCOUNTER — Ambulatory Visit: Payer: Medicare (Managed Care) | Admitting: Physical Therapy

## 2022-01-12 ENCOUNTER — Ambulatory Visit: Payer: Medicare (Managed Care)

## 2022-01-12 ENCOUNTER — Encounter: Payer: Self-pay | Admitting: Physical Therapy

## 2022-01-12 DIAGNOSIS — R1313 Dysphagia, pharyngeal phase: Secondary | ICD-10-CM

## 2022-01-12 DIAGNOSIS — M6281 Muscle weakness (generalized): Secondary | ICD-10-CM

## 2022-01-12 DIAGNOSIS — R2681 Unsteadiness on feet: Secondary | ICD-10-CM | POA: Diagnosis not present

## 2022-01-12 DIAGNOSIS — R2689 Other abnormalities of gait and mobility: Secondary | ICD-10-CM

## 2022-01-12 NOTE — Therapy (Signed)
?OUTPATIENT SPEECH LANGUAGE PATHOLOGY TREATMENT NOTE ? ? ?Patient Name: Cindy [name] ?MRN: [id number] ?DOB:1932-01-29, 88 y.o., female ?Today's Date: 01/12/2022 ? ?PCP: Lawerance Cruel, MD ?REFERRING PROVIDER: Lawerance Cruel, MD ? ? End of Session - 01/12/22 1223   ? ? Visit Number 5   ? Number of Visits 13   ? Date for SLP Re-Evaluation 02/09/22   ? Authorization Type [employer]   ? SLP Start Time 1231   ? SLP Stop Time  1315   ? SLP Time Calculation (min) 44 min   ? Activity Tolerance Patient tolerated treatment well   ? ?  ?  ? ?  ? ? ? ?Past Medical History:  ?Diagnosis Date  ? Asthma   ? Bronchitis   ? COPD (chronic obstructive pulmonary disease) ([place])   ? Coronary artery disease   ? MI age 71  ? GERD (gastroesophageal reflux disease)   ? Heart attack ([place])   ? Hypercholesteremia   ? Hypertension   ? Iron deficiency anemia   ? used to see hem in [place], [place].   ? Renal insufficiency   ? Thyroid disease   ? ?Past Surgical History:  ?Procedure Laterality Date  ? APPENDECTOMY    ? BREAST BIOPSY Right   ? benign  ? CORONARY ANGIOPLASTY WITH STENT PLACEMENT    ? CORONARY ARTERY BYPASS GRAFT  09/1979  ? LEFT HEART CATH AND CORS/GRAFTS ANGIOGRAPHY N/A 11/27/2018  ? Procedure: LEFT HEART CATH AND CORS/GRAFTS ANGIOGRAPHY;  Surgeon: Lorretta Harp, MD;  Location: [facility];  Service: Cardiovascular;  Laterality: N/A;  ? Moh's Surgeries    ? for squamous cell carcinoma  ? PARTIAL HYSTERECTOMY  10/1978  ? ?Patient Active Problem List  ? Diagnosis Date Noted  ? Septic encephalopathy 12/17/2021  ? Acute respiratory failure with hypoxia ([place]) 12/13/2021  ? Leukocytosis 12/13/2021  ? Acute respiratory failure with hypoxia secondary to sepsis due to pneumonia ([place]) 12/12/2021  ? Essential (primary) hypertension 12/25/2018  ? Carotid artery disease ([place]) 12/25/2018  ? Chest pain of uncertain etiology   ? Unstable angina ([place]) 11/26/2018  ? Sepsis ([place]) 01/06/2017  ? Thrombocytopenia ([place]) 01/06/2017  ? Lower  urinary tract infectious disease 01/06/2017  ? Hypothyroidism 01/06/2017  ? Anemia 01/06/2017  ? Umbilical hernia 53/29/9242  ? Coronary atherosclerosis of native coronary artery 12/11/2013  ? Old MI (myocardial infarction) 12/11/2013  ? History of tobacco use 12/11/2013  ? Angina decubitus ([place]) 12/11/2013  ? Hyperlipidemia 12/11/2013  ? Rhinitis 09/16/2012  ? ILD (interstitial lung disease) ([place]) 05/08/2012  ? Pulmonary nodule, right 05/06/2012  ? Cough 03/20/2012  ? Iron deficiency anemia   ? Chronic kidney disease, stage 3b ([place])   ? ? ?ONSET DATE: 12-12-21 ? ?REFERRING DIAG: R13.10 (ICD-10-CM) - Dysphagia, unspecified  ? ?THERAPY DIAG: Dysphagia, pharyngeal phase ? ?SUBJECTIVE: "I am tired. I have had too many appointments" ? ?PAIN:  ?Are you having pain? Yes ?NPRS scale: 10/10 ?Pain location: groin/neck ? ?OBJECTIVE:  ? ?DIAGNOSTIC FINDINGS (MBSS): "Pt has a mild pharyngeal dysphagia, characterized primarily by mistimed swallowing and mildly reduced epiglottic inversion. Trace penetration as deep as to the vocal folds and intermittent aspiration occurs with thin liquids. She cannot clear aspirates with reflexive or volitional coughing despite small volume. Aspiration primarily occurs when using a straw and is not eliminated with use of a chin tuck. Pt is only able to tuck her chin a little bit due to reports of neck pain, but even subtle  movement that she can tolerate without pain is adequate to increase airway protection when also cued for small, single cup sips. Although penetration still occurs, it is more shallow and mostly clears upon completion of the swallow. Recommend regular solids and thin liquids by cup via small, single sips and use of a chin tuck as tolerated." ?  ? SLP Diet Recommendations: Regular solids;Thin liquid ? Liquid Administration via: Cup;No straw ? Medication Administration: Whole meds with puree ? Supervision: Patient able to self feed;Intermittent supervision to cue for compensatory  strategies ? Compensations: Slow rate;Small sips/bites;Clear throat intermittently;Chin tuck;Other (Comment) (chew food well; one sip at a time) ? Postural Changes: Seated upright at 90 degrees ? Oral Care Recommendations: Oral care BID ?  ?  ?RECOMMENDATIONS FROM OBJECTIVE SWALLOW STUDY (MBSS/FEES):   ?Objective swallow impairments: mistimed swallow initiation and mildly reduced epiglottic inversion  ?Objective recommended compensations: No straw, Chin Tuck, Intermittent throat clears ?  ?  ?TODAY'S TREATMENT:  ?01-12-22 ?Pt brought in EMST device and exercise tracker for swallowing exercises. Deferred EMST today due to reported elevated blood pressure at beginning of PT following ST/EMST, solely as precaution. Instructed pt to trial EMST exercises at home and document BP before/after exercises (husband feels medical setting elevates BP; SLP discussed with PT who reported elevated BP w/o EMST with recommendation to consult MD for further medication management). In review of exercise tracker, pt completed x20 reps of Effortful swallows the last two days & x10 reps of Masako swallows yesterday. Today in therapy, pt completed effortful swallows x21 and Masako swallows x11 with rare min A and extended time. Pt and husband denied any overt swallowing difficulties or overt s/sx of aspiration at home since last ST session. Pt exhibited immediate cough x1 with cup sip of water. No other immediate s/sx of aspiration exhibited today.  ? ? ?01-09-22 ?Conducted ongoing education and training for EMST and swallow exercises. SLP re-assessed EMST setting for possible resistance advancement, in which current setting deemed adequate based on clinical observations and patient reports (still challenging). Pt completed 25 total reps (5 sets of 5 reps) with occasional min A for effort and accuracy. Pt completed effortful swallows x20 today with rare min A. SLP trialed effortful swallow with cup sip x1 resulting in cough. No other overt  s/sx of aspiration with cup sips of thin liquids. Husband reported coughing has decreased indicating "not as much as used to." [employer] swallow today to target base of tongue, in which pt able to complete x5 in session with extra time.  ? ?01-05-22 ?Provide additional instruction and coaching for completion of EMST and effortful swallow. Pt reports EMST effort has decreased as she practices, will continue at current setting until next ST session at which time pressure may be increased if indicated. Performed x17 effortful swallows with rare min A for completion. Reviewed swallowing compensations recommended to increase pt's swallow safety and general aspiration precautions. Pt tells ST she has not used a straw since MBSS.  With thin liquids, no overt s/sx of aspiration on this date.  ? ?01-02-22 ?Initiated education and training of swallow exercises (Effortful swallow) and Expiratory Muscle Strength Training (EMST) to target pharyngeal deficits and reduced cough strength to maximize swallow function and safety. SLP conducted assessment of MEP for EMST. Pt's maximum expiratory pressure today was 42 cm H2O. EMST 150 device was started at 75% MEP at 31 cm H2O. SLP provided demonstration and verbal instruction to utilize EMST device. Pt completed 5 repetitions over 2 sets  during our session with usual fading to rare cues for short, strong exhalations. HEP initiated for RMST (5 sets of 5 reps for 5 days/week) and effortful swallows of saliva (due to decreased bolus coordination with water). Pt consumed small, single sips of thin liquids via cup with coughing episode x1 (when trialing effortful swallow).  ? ?  ?PATIENT EDUCATION: ?Education details: see above ?Person educated: Patient and Spouse ?Education method: Explanation, Demonstration, Verbal cues, Handout ?Education comprehension: verbalized understanding/demonstration and needs further education ?  ?  ?ASSESSMENT: ?  ?CLINICAL IMPRESSION: ?Patient is a 88  y.o. female who was seen today for pharyngeal dysphagia. Pt recently hospitalized with sepsis and hypoxia secondary to RLL PNA. MBSS revealed mild pharyngeal dysphagia with recommendation for no straw and c

## 2022-01-12 NOTE — Patient Instructions (Addendum)
Please check your blood pressure at home prior to beginning your breathing exercises AND after your breathing exercises. Please write down your numbers and bring back to next session.  ?

## 2022-01-12 NOTE — Therapy (Signed)
?OUTPATIENT PHYSICAL THERAPY TREATMENT NOTE ? ? ?Patient Name: Cindy [name] ?MRN: [id number] ?DOB:[birth year], 88 y.o., female ?Today's Date: 01/12/2022 ? ?PCP: Lawerance Cruel, MD ?REFERRING PROVIDER: Lawerance Cruel, MD ? ? PT End of Session - 01/12/22 1323   ? ? Visit Number 5   ? Number of Visits 9   ? Date for PT Re-Evaluation 01/28/22   ? Authorization Type [employer]   ? PT Start Time 1316   ? PT Stop Time 1400   ? PT Time Calculation (min) 44 min   ? Equipment Utilized During Treatment Gait belt   ? Activity Tolerance Patient tolerated treatment well;Patient limited by fatigue   ? Behavior During Therapy [facility] for tasks assessed/performed   ? ?  ?  ? ?  ? ? ?Past Medical History:  ?Diagnosis Date  ? Asthma   ? Bronchitis   ? COPD (chronic obstructive pulmonary disease) ([place])   ? Coronary artery disease   ? MI age 89  ? GERD (gastroesophageal reflux disease)   ? Heart attack ([place])   ? Hypercholesteremia   ? Hypertension   ? Iron deficiency anemia   ? used to see hem in [place], [place].   ? Renal insufficiency   ? Thyroid disease   ? ?Past Surgical History:  ?Procedure Laterality Date  ? APPENDECTOMY    ? BREAST BIOPSY Right   ? benign  ? CORONARY ANGIOPLASTY WITH STENT PLACEMENT    ? CORONARY ARTERY BYPASS GRAFT  09/1979  ? LEFT HEART CATH AND CORS/GRAFTS ANGIOGRAPHY N/A 11/27/2018  ? Procedure: LEFT HEART CATH AND CORS/GRAFTS ANGIOGRAPHY;  Surgeon: Lorretta Harp, MD;  Location: [facility];  Service: Cardiovascular;  Laterality: N/A;  ? Moh's Surgeries    ? for squamous cell carcinoma  ? PARTIAL HYSTERECTOMY  10/1978  ? ?Patient Active Problem List  ? Diagnosis Date Noted  ? Septic encephalopathy 12/17/2021  ? Acute respiratory failure with hypoxia ([place]) 12/13/2021  ? Leukocytosis 12/13/2021  ? Acute respiratory failure with hypoxia secondary to sepsis due to pneumonia ([place]) 12/12/2021  ? Essential (primary) hypertension 12/25/2018  ? Carotid artery disease ([place]) 12/25/2018  ? Chest pain of  uncertain etiology   ? Unstable angina ([place]) 11/26/2018  ? Sepsis ([place]) 01/06/2017  ? Thrombocytopenia ([place]) 01/06/2017  ? Lower urinary tract infectious disease 01/06/2017  ? Hypothyroidism 01/06/2017  ? Anemia 01/06/2017  ? Umbilical hernia 74/25/9563  ? Coronary atherosclerosis of native coronary artery 12/11/2013  ? Old MI (myocardial infarction) 12/11/2013  ? History of tobacco use 12/11/2013  ? Angina decubitus ([place]) 12/11/2013  ? Hyperlipidemia 12/11/2013  ? Rhinitis 09/16/2012  ? ILD (interstitial lung disease) ([place]) 05/08/2012  ? Pulmonary nodule, right 05/06/2012  ? Cough 03/20/2012  ? Iron deficiency anemia   ? Chronic kidney disease, stage 3b ([place])   ? ? ?REFERRING DIAG: Other abnormalities of gait and mobility (R26.89);Muscle weakness (generalized) (M62.81)  ? ?THERAPY DIAG:  ?Other abnormalities of gait and mobility ? ?Muscle weakness (generalized) ? ?Unsteadiness on feet ? ?PERTINENT HISTORY: Asthma, bronchitis, COPD, CAD (MI at age 89), HTN, renal insufficiency, and thyroid disease. ? ?PRECAUTIONS: Fall ? ?SUBJECTIVE: Pt states her neck is still bothering her.  She states she did not sleep well last night due to feeling like a knife is going through her and throbbing from the genitals up through the top of her abdomen.  "The pain comes and goes and I never know when it's coming so I deal with it  and goes on." She states she has a umbilical hernia on the R side of her umbilicus, but endorses that that is not the area of pain. ? ?PAIN:  ?Are you having pain? Yes: NPRS scale: 8/10 ?Pain location: neck and whole back ?Pain description: achy and sore ?Aggravating factors: hurts with all movements ?Relieving factors: heat helps neck temporarily, tramadol helps back mildly ? ?Today's Treatment: ?BP at onset of session L arm in sitting:  164/68; second BP after 3 min quiet rest 162/64.   PT continues w/ caution as pt is also asymptomatic, no headache, diaphoresis, or otherwise. ?From notes prior:  Husband  states that they take BP once a week at home and she ranges from 761-607P systolic and 71G-62I on bottom. ?Pt has inc posterior lean in sitting today.  Pt maintains balance at EOM.  Pt extremely limited by fatigue and decreased engagement to task today. ? ?Gait training w/o AD:  345' slow speed, unable to inc with cuing, trialed larger steps with pt unable to maintain without lateral LOB.  Pt scuffs L foot on floor x2 no LOB, no evidence of foot drop, pt wears slip on shoes.  Edu on safety w/ tennis shoes or shoes to enclose the heel.  Pt SOB following task.  SpO2 96%, HR 58bpm ?Pt standing on airex standing x30 sec>feet together x30 sec> normal BOS performing bimanual task of unstacking small and large cones> using functional reach with LUE to retrieve and place cones low<>high progressed to feet together performing same task. ?Pt standing w/o UE support holding 1lb weighted ball w/ BUE extended with forward reach to 4" above floor x10>trunk rotations x10 each side. ? ?  ? ?PATIENT EDUCATION: ?Education details:  Edu on monitoring BP at home and safe limits for exercise.       ?Person educated: Patient & Spouse ?Education method: Explanation ?Education comprehension: verbalized understanding ?  ?  ?HOME EXERCISE PROGRAM: ?[id number] ? ?OBJECTIVE:  ?  ?COGNITION: ?Overall cognitive status: Within functional limits for tasks assessed ?            ?SENSATION: ?Light touch: WFL ?  ?  ?  ?  ?POSTURE: rounded shoulders, forward head, and posterior pelvic tilt ?  ?  ?MMT:   ?  ?MMT Right ?12/29/2021 Left ?12/29/2021  ?Hip flexion 4/5 4/5  ?Hip extension      ?Hip abduction      ?Hip adduction      ?Hip internal rotation      ?Hip external rotation      ?Knee flexion 5/5 5/5  ?Knee extension 5/5 5/5  ?Ankle dorsiflexion 5/5 5/5  ?Ankle plantarflexion      ?Ankle inversion      ?Ankle eversion      ?(Blank rows = not tested) ?  ?  ?  ?TRANSFERS: ?Assistive device utilized: None  ?Sit to stand: SBA ?Stand to sit: SBA ?No UE  support  ?  ?  ?STAIRS: ?          Level of Assistance: SBA ?          Stair Negotiation Technique: Alternating Pattern  with Bilateral Rails ?          Number of Stairs:4    ?          Height of Stairs: 6     ?Comments: Pt reports having stairs at her grandkids.  ?  ?GAIT: ?Gait pattern: step through pattern, decreased stride length, and trunk flexed Bilat genu  valgum.  ?Distance walked: Clinic distances during eval.  ?Assistive device utilized: None ?Level of assistance: SBA ?  ?  ?FUNCTIONAL TESTs:  ?5 times sit to stand: 13.5 no UE support ?30 seconds chair stand test 11 sit <> stands (above average for age) ?Timed up and go (TUG): 12.35 sec no AD  ?10 meter walk test: 16.62 seconds = 1.97 ft/sec, limited community ambulator ?  ?  [facility] PT Assessment - 12/29/21 1312   ?  ?    ?     ?  Functional Gait  Assessment  ?  Gait assessed  Yes   ?  Gait Level Surface Walks 20 ft, slow speed, abnormal gait pattern, evidence for imbalance or deviates 10-15 in outside of the 12 in walkway width. Requires more than 7 sec to ambulate 20 ft.   9.69  ?  Change in Gait Speed Able to change speed, demonstrates mild gait deviations, deviates 6-10 in outside of the 12 in walkway width, or no gait deviations, unable to achieve a major change in velocity, or uses a change in velocity, or uses an assistive device.   ?  Gait with Horizontal Head Turns Performs head turns smoothly with slight change in gait velocity (eg, minor disruption to smooth gait path), deviates 6-10 in outside 12 in walkway width, or uses an assistive device.   limited ROM  ?  Gait with Vertical Head Turns Performs task with slight change in gait velocity (eg, minor disruption to smooth gait path), deviates 6 - 10 in outside 12 in walkway width or uses assistive device   limited ROM  ?  Gait and Pivot Turn Pivot turns safely within 3 sec and stops quickly with no loss of balance.   ?  Step Over Obstacle Is able to step over one shoe box (4.5 in total height) but  must slow down and adjust steps to clear box safely. May require verbal cueing.   ?  Gait with Narrow Base of Support Ambulates less than 4 steps heel to toe or cannot perform without assistance.   ?  Gait with Eye

## 2022-01-15 NOTE — Progress Notes (Signed)
?Cardiology Office Note:   ? ?Date:  01/17/2022  ? ?ID:  Cindy [name], DOB 25-Dec-1931, MRN [id number] ? ?PCP:  Lawerance Cruel, MD  ?Cardiologist:  Candee Furbish, MD   ? ?Referring MD: Lawerance Cruel, MD  ? ? ? ?History of Present Illness:   ? ?Cindy [name] is a 88 y.o. female with a hx of hypertension, CAD, MI (88 years old), hypercholesterolemia, COPD, and GERD here today for hospital follow-up.  ? ?She presented to the ED 12/12/21 for acute respiratory failure. The day before, she complained of weakness and chills. Her temperature at home was 99.1 ?F and she was noted to be weak and disoriented. She also admitted to recent dysphagia. Upon admission, she was noted to be febrile up to 104.3 ?F with tachycardia and tachypnea and WBC 11.5. Her oxygen saturation was 87%. Chest CT showed right base consolidation posteriorly concerning for developing infection/inflammation. She was advised to follow-up with pulmonary and cardiology.  ? ?Today, she is accompanied by a her husband and doing well. Prior to her hospitalization, she was doing well. She suddenly experienced fever and weakness causing her to present to the ED. She is recovering well. However, she still has difficulty chewing and swallowing.  ? ?She endorses occasional chest pain starting before her hospitalization. The pain occurs randomly when she is sitting or walking around her home. She denies any palpitations, shortness of breath, lightheadedness, headaches, syncope, orthopnea, PND, lower extremity edema or exertional symptoms. ? ?She is compliant and tolerating her medications. Her most strenuous activity is washing laundry. Dr. Harrington Challenger recommends she receive a chest X-ray.  ? ?Extensive review of hospital records, personally reviewed CT scan.  Did show some emphysematous changes as well.  Coronary artery calcification noted.  Lab work reviewed. ? ? ?Past Medical History:  ?Diagnosis Date  ? Asthma   ? Bronchitis   ? COPD (chronic obstructive  pulmonary disease) ([place])   ? Coronary artery disease   ? MI age 78  ? GERD (gastroesophageal reflux disease)   ? Heart attack ([place])   ? Hypercholesteremia   ? Hypertension   ? Iron deficiency anemia   ? used to see hem in [place], [place].   ? Renal insufficiency   ? Thyroid disease   ? ? ?Past Surgical History:  ?Procedure Laterality Date  ? APPENDECTOMY    ? BREAST BIOPSY Right   ? benign  ? CORONARY ANGIOPLASTY WITH STENT PLACEMENT    ? CORONARY ARTERY BYPASS GRAFT  09/1979  ? LEFT HEART CATH AND CORS/GRAFTS ANGIOGRAPHY N/A 11/27/2018  ? Procedure: LEFT HEART CATH AND CORS/GRAFTS ANGIOGRAPHY;  Surgeon: Lorretta Harp, MD;  Location: [facility];  Service: Cardiovascular;  Laterality: N/A;  ? Moh's Surgeries    ? for squamous cell carcinoma  ? PARTIAL HYSTERECTOMY  10/1978  ? ? ?Current Medications: ?Current Meds  ?Medication Sig  ? acetaminophen (TYLENOL) 325 MG tablet Take 2 tablets (650 mg total) by mouth every 6 (six) hours as needed for mild pain (or Fever >/= 101).  ? albuterol (VENTOLIN HFA) 108 (90 Base) MCG/ACT inhaler Inhale 1 puff into the lungs every 4 (four) hours as needed for wheezing or shortness of breath.  ? aspirin 81 MG tablet Take 81 mg by mouth daily.   ? atorvastatin (LIPITOR) 80 MG tablet Take 1 tablet (80 mg total) by mouth daily.  ? benzonatate (TESSALON) 200 MG capsule Take 1 capsule by mouth twice daily as needed for cough (Patient  taking differently: Take 200 mg by mouth 2 (two) times daily.)  ? Calcium Carbonate-Vitamin D 600-200 MG-UNIT TABS Take 1 tablet by mouth 2 (two) times daily.  ? ferrous sulfate 325 (65 FE) MG tablet Take 325 mg by mouth daily with breakfast.  ? fexofenadine (ALLEGRA) 180 MG tablet Take 180 mg by mouth daily.  ? ipratropium (ATROVENT) 0.06 % nasal spray Place 2 sprays into both nostrils 2 (two) times daily.  ? isosorbide mononitrate (IMDUR) 30 MG 24 hr tablet Take 90 mg by mouth every morning.  ? levothyroxine (SYNTHROID, LEVOTHROID) 175 MCG tablet Take 175  mcg by mouth every evening.  ? losartan (COZAAR) 50 MG tablet Take 50 mg by mouth daily.  ? metoprolol (TOPROL-XL) 50 MG 24 hr tablet Take 50 mg by mouth daily.    ? montelukast (SINGULAIR) 10 MG tablet Take 10 mg by mouth daily.  ? Multiple Vitamins-Minerals (PRESERVISION AREDS 2 PO) Take 1 capsule by mouth 2 (two) times daily.  ? Niacinamide-Zn-Cu-Methfo-Se-Cr (NICOTINAMIDE PO) Take 1 capsule by mouth 2 (two) times daily.  ? nitroGLYCERIN (NITROSTAT) 0.4 MG SL tablet PLACE 1 TABLET UNDER THE TONGUE EVERY 5 MINUTES AS NEEDED FOR CHEST PAIN (Patient taking differently: Place 0.4 mg under the tongue every 5 (five) minutes as needed for chest pain.)  ? ondansetron (ZOFRAN) 4 MG tablet Take 1 tablet (4 mg total) by mouth every 6 (six) hours as needed for nausea.  ? Polyethyl Glycol-Propyl Glycol (SYSTANE OP) Apply 1-2 drops to eye at bedtime as needed (dry eyes).  ? spironolactone-hydrochlorothiazide (ALDACTAZIDE) 25-25 MG tablet Take 1 tablet by mouth daily.  ? traMADol (ULTRAM) 50 MG tablet Take 50 mg by mouth 2 (two) times daily.  ?  ? ?Allergies:   Codeine  ? ?Social History  ? ?Socioeconomic History  ? Marital status: Married  ?  Spouse name: Not on file  ? Number of children: 2  ? Years of education: Not on file  ? Highest education level: Not on file  ?Occupational History  ? Occupation: retired  ?  Comment: retired  ?Tobacco Use  ? Smoking status: Former  ?  Packs/day: 1.50  ?  Years: 22.00  ?  Pack years: 33.00  ?  Types: Cigarettes  ?  Quit date: 10/23/1979  ?  Years since quitting: 42.2  ? Smokeless tobacco: Never  ?Vaping Use  ? Vaping Use: Never used  ?Substance and Sexual Activity  ? Alcohol use: Yes  ?  Comment: occasional  ? Drug use: No  ? Sexual activity: Not on file  ?Other Topics Concern  ? Not on file  ?Social History Narrative  ? Not on file  ? ?Social Determinants of Health  ? ?Financial Resource Strain: Not on file  ?Food Insecurity: Not on file  ?Transportation Needs: Not on file  ?Physical  Activity: Not on file  ?Stress: Not on file  ?Social Connections: Not on file  ?  ? ?Family History: ?The patient's family history includes Heart disease in her father and mother; Melanoma in her sister. There is no history of Breast cancer. ? ?ROS:   ?Please see the history of present illness.    ?(+) Dysphagia ?(+) Chest pain ?All other systems reviewed and negative.  ? ?EKGs/Labs/Other Studies Reviewed:   ? ?The following studies were reviewed today: ?CT Chest 12/12/21 ?IMPRESSION: ?1. Right base slightly heterogeneous hypodense consolidation posteriorly may represent developing infection/inflammation. ?2. Small fat containing umbilical hernia with an abdominal defect of 7 mm and associated  spat stranding of the associated mesentery. Correlate clinically for incarceration. No associated bowel obstruction. ?3. No acute intra-abdominal or intrapelvic abnormality. ?4. Aortic Atherosclerosis (ICD10-I70.0) including four-vessel ?coronary calcifications status post coronary artery bypass graft. ?5.  Emphysema (ICD10-J43.9). ? ?Carotid Duplex 01/23/19 ?Summary:  ?Right Carotid: Velocities in the right ICA are consistent with a 1-39% stenosis. The RICA velocities remain within normal range and are stable compared to the prior exam.  ? ?Left Carotid: Velocities in the left ICA are consistent with a 40-59% stenosis, based on peak systolic velocities and plaque formation. The ECA appears >50% stenosed. The LICA velocities are elevated and have increased compared to the prior exam.  ? ?Vertebrals:  Bilateral vertebral arteries demonstrate antegrade flow.  ? ?Subclavians: Left subclavian artery was stenotic. Normal flow hemodynamics were seen in the right subclavian artery.  ? ?L Heart Cath 11/27/18 ?Dist RCA lesion is 100% stenosed. ?Ost RPDA to RPDA lesion is 100% stenosed. ?Post Atrio lesion is 100% stenosed. ?Prox RCA to Mid RCA lesion is 100% stenosed. ?Origin to Prox Graft lesion is 100% stenosed. ?Mid Graft to Insertion  lesion is 100% stenosed. ?The left ventricular systolic function is normal. ?LV end diastolic pressure is normal. ?The left ventricular ejection fraction is 55-65% by visual estimate.  ? ?Wall Motion ? ?Resting    ?

## 2022-01-16 ENCOUNTER — Other Ambulatory Visit: Payer: Self-pay

## 2022-01-16 ENCOUNTER — Ambulatory Visit: Payer: Medicare (Managed Care)

## 2022-01-16 ENCOUNTER — Ambulatory Visit: Payer: Medicare (Managed Care) | Admitting: Physical Therapy

## 2022-01-16 ENCOUNTER — Encounter: Payer: Self-pay | Admitting: Physical Therapy

## 2022-01-16 DIAGNOSIS — Z09 Encounter for follow-up examination after completed treatment for conditions other than malignant neoplasm: Secondary | ICD-10-CM | POA: Diagnosis not present

## 2022-01-16 DIAGNOSIS — R2681 Unsteadiness on feet: Secondary | ICD-10-CM

## 2022-01-16 DIAGNOSIS — J189 Pneumonia, unspecified organism: Secondary | ICD-10-CM | POA: Diagnosis not present

## 2022-01-16 DIAGNOSIS — R1313 Dysphagia, pharyngeal phase: Secondary | ICD-10-CM

## 2022-01-16 DIAGNOSIS — R2689 Other abnormalities of gait and mobility: Secondary | ICD-10-CM

## 2022-01-16 DIAGNOSIS — M6281 Muscle weakness (generalized): Secondary | ICD-10-CM

## 2022-01-16 NOTE — Therapy (Signed)
?OUTPATIENT PHYSICAL THERAPY TREATMENT NOTE ? ? ?Patient Name: Cindy [name] ?MRN: [id number] ?DOB:[birth year], 88 y.o., female ?Today's Date: 01/16/2022 ? ?PCP: Lawerance Cruel, MD ?REFERRING PROVIDER: Lawerance Cruel, MD ? ? PT End of Session - 01/16/22 1148   ? ? Visit Number 6   ? Number of Visits 9   ? Date for PT Re-Evaluation 01/28/22   ? Authorization Type [employer]   ? PT Start Time 1146   ? PT Stop Time 1230   ? PT Time Calculation (min) 44 min   ? Equipment Utilized During Treatment Gait belt   ? Activity Tolerance Patient tolerated treatment well;Patient limited by fatigue   ? Behavior During Therapy [facility] for tasks assessed/performed   ? ?  ?  ? ?  ? ? ?Past Medical History:  ?Diagnosis Date  ? Asthma   ? Bronchitis   ? COPD (chronic obstructive pulmonary disease) ([place])   ? Coronary artery disease   ? MI age 42  ? GERD (gastroesophageal reflux disease)   ? Heart attack ([place])   ? Hypercholesteremia   ? Hypertension   ? Iron deficiency anemia   ? used to see hem in [place], [place].   ? Renal insufficiency   ? Thyroid disease   ? ?Past Surgical History:  ?Procedure Laterality Date  ? APPENDECTOMY    ? BREAST BIOPSY Right   ? benign  ? CORONARY ANGIOPLASTY WITH STENT PLACEMENT    ? CORONARY ARTERY BYPASS GRAFT  09/1979  ? LEFT HEART CATH AND CORS/GRAFTS ANGIOGRAPHY N/A 11/27/2018  ? Procedure: LEFT HEART CATH AND CORS/GRAFTS ANGIOGRAPHY;  Surgeon: Lorretta Harp, MD;  Location: [facility];  Service: Cardiovascular;  Laterality: N/A;  ? Moh's Surgeries    ? for squamous cell carcinoma  ? PARTIAL HYSTERECTOMY  10/1978  ? ?Patient Active Problem List  ? Diagnosis Date Noted  ? Septic encephalopathy 12/17/2021  ? Acute respiratory failure with hypoxia ([place]) 12/13/2021  ? Leukocytosis 12/13/2021  ? Acute respiratory failure with hypoxia secondary to sepsis due to pneumonia ([place]) 12/12/2021  ? Essential (primary) hypertension 12/25/2018  ? Carotid artery disease ([place]) 12/25/2018  ? Chest pain of  uncertain etiology   ? Unstable angina ([place]) 11/26/2018  ? Sepsis ([place]) 01/06/2017  ? Thrombocytopenia ([place]) 01/06/2017  ? Lower urinary tract infectious disease 01/06/2017  ? Hypothyroidism 01/06/2017  ? Anemia 01/06/2017  ? Umbilical hernia 85/63/1497  ? Coronary atherosclerosis of native coronary artery 12/11/2013  ? Old MI (myocardial infarction) 12/11/2013  ? History of tobacco use 12/11/2013  ? Angina decubitus ([place]) 12/11/2013  ? Hyperlipidemia 12/11/2013  ? Rhinitis 09/16/2012  ? ILD (interstitial lung disease) ([place]) 05/08/2012  ? Pulmonary nodule, right 05/06/2012  ? Cough 03/20/2012  ? Iron deficiency anemia   ? Chronic kidney disease, stage 3b ([place])   ? ? ?REFERRING DIAG: Other abnormalities of gait and mobility (R26.89);Muscle weakness (generalized) (M62.81)  ? ?THERAPY DIAG:  ?Other abnormalities of gait and mobility ? ?Muscle weakness (generalized) ? ?Unsteadiness on feet ? ?PERTINENT HISTORY: Asthma, bronchitis, COPD, CAD (MI at age 6), HTN, renal insufficiency, and thyroid disease. ? ?PRECAUTIONS: Fall ? ?SUBJECTIVE: Pt states her neck is still bothering her.  She states she did not sleep well last night due to feeling like a knife is going through her and throbbing from the genitals up through the top of her abdomen.  "The pain comes and goes and I never know when it's coming so I deal with it  and goes on." She states she has a umbilical hernia on the R side of her umbilicus, but endorses that that is not the area of pain. ? ?PAIN:  ?Are you having pain? Yes: NPRS scale: 8/10 ?Pain location: neck and whole back ?Pain description: achy and sore ?Aggravating factors: hurts with all movements ?Relieving factors: heat helps neck temporarily, tramadol helps back mildly ?Remains unchanged from prior visit ? ?Today's Treatment: ?BP at onset of session L arm in sitting:  146/68, pulse 59bpm, used pt's personal automatic cuff per pt and husband request to compare to manual cuff used in therapy; second BP  after 2 min quiet rest 142/64 w/ manual cuff.    ?From notes prior:  Husband states that they take BP once a week at home and she ranges from 568-127N systolic and 17G-01V on bottom. ? ?Warmup on NuStep w/ BLE only for increased strengthening w/o shoulder irritation.  L3x27mn>L4x3min, goal of 40 steps per min, achieved 45 avg steps per min. ? ?Pt stands on incline w/ normal Normal BOS x30 sec>alt wide semi tandem 2x30sec EO>EC ? ?Pt standing on airex performing stair step ups 2x10 each LE using unilateral UE> no UE support requiring heavy CGA to prevent posterior lean ? ?In // bars: pt uses bilateral UE support to stand on rocker board with body weight centered> 1 trial RUE support>3 trials no UE support x30 sec.  PT facilitates weight shift forward/backward progressed to pt initiating weight shift using UE progressed to no UE support x10 forward and backward shifts cued to tap board to ground.  CGA ? ?Gait training x200' around gym [facility].  Edu on sequencing with use on R side.  Pt demo improved carryover during task w/ improved contact with ground and dec UE reliance.  Pt continues to be challenged when dual tasking and using the cane as she intermittently briefly drags the cane as she is speaking to therapist.  Able to self-correct today.   ?  ? ?PATIENT EDUCATION: ?Education details:  Edu on appropriate weight bearing through cane and to not begin using at home due to continued cognitive safety concerns.      ?Person educated: Patient & Spouse ?Education method: Explanation ?Education comprehension: verbalized understanding ?  ?  ?HOME EXERCISE PROGRAM: ?[id number]? ?OBJECTIVE:  ?  ?COGNITION: ?Overall cognitive status: Within functional limits for tasks assessed ?            ?SENSATION: ?Light touch: WFL ?  ?  ?  ?  ?POSTURE: rounded shoulders, forward head, and posterior pelvic tilt ?  ?  ?MMT:   ?  ?MMT Right ?12/29/2021 Left ?12/29/2021  ?Hip flexion 4/5 4/5  ?Hip extension      ?Hip abduction       ?Hip adduction      ?Hip internal rotation      ?Hip external rotation      ?Knee flexion 5/5 5/5  ?Knee extension 5/5 5/5  ?Ankle dorsiflexion 5/5 5/5  ?Ankle plantarflexion      ?Ankle inversion      ?Ankle eversion      ?(Blank rows = not tested) ?  ?  ?  ?TRANSFERS: ?Assistive device utilized: None  ?Sit to stand: SBA ?Stand to sit: SBA ?No UE support  ?  ?  ?STAIRS: ?          Level of Assistance: SBA ?          Stair Negotiation Technique: Alternating Pattern  with Bilateral Rails ?          Number of Stairs:4    ?          Height of Stairs: 6     ?Comments: Pt reports having stairs at her grandkids.  ?  ?GAIT: ?Gait pattern: step through pattern, decreased stride length, and trunk flexed Bilat genu valgum.  ?Distance walked: Clinic distances during eval.  ?Assistive device utilized: None ?Level of assistance: SBA ?  ?  ?FUNCTIONAL TESTs:  ?5 times sit to stand: 13.5 no UE support ?30 seconds chair stand test 11 sit <> stands (above average for age) ?Timed up and go (TUG): 12.35 sec no AD  ?10 meter walk test: 16.62 seconds = 1.97 ft/sec, limited community ambulator ?  ?  [facility] PT Assessment - 12/29/21 1312   ?  ?    ?     ?  Functional Gait  Assessment  ?  Gait assessed  Yes   ?  Gait Level Surface Walks 20 ft, slow speed, abnormal gait pattern, evidence for imbalance or deviates 10-15 in outside of the 12 in walkway width. Requires more than 7 sec to ambulate 20 ft.   9.69  ?  Change in Gait Speed Able to change speed, demonstrates mild gait deviations, deviates 6-10 in outside of the 12 in walkway width, or no gait deviations, unable to achieve a major change in velocity, or uses a change in velocity, or uses an assistive device.   ?  Gait with Horizontal Head Turns Performs head turns smoothly with slight change in gait velocity (eg, minor disruption to smooth gait path), deviates 6-10 in outside 12 in walkway width, or uses an assistive device.   limited ROM  ?  Gait with Vertical Head Turns Performs  task with slight change in gait velocity (eg, minor disruption to smooth gait path), deviates 6 - 10 in outside 12 in walkway width or uses assistive device   limited ROM  ?  Gait and Pivot Turn Pivot turns safe

## 2022-01-16 NOTE — Therapy (Signed)
?OUTPATIENT SPEECH LANGUAGE PATHOLOGY TREATMENT NOTE ? ? ?Patient Name: Cindy [name] ?MRN: [id number] ?DOB:Jun 22, 1932, 88 y.o., female ?Today's Date: 01/16/2022 ? ?PCP: Lawerance Cruel, MD ?REFERRING PROVIDER: Lawerance Cruel, MD ? ? End of Session - 01/16/22 1246   ? ? Visit Number 6   ? Number of Visits 13   ? Date for SLP Re-Evaluation 02/09/22   ? Authorization Type [employer]   ? SLP Start Time 1102   ? SLP Stop Time  1145   ? SLP Time Calculation (min) 43 min   ? Activity Tolerance Patient tolerated treatment well;Patient limited by fatigue   ? ?  ?  ? ?  ? ? ? ? ?Past Medical History:  ?Diagnosis Date  ? Asthma   ? Bronchitis   ? COPD (chronic obstructive pulmonary disease) ([place])   ? Coronary artery disease   ? MI age 19  ? GERD (gastroesophageal reflux disease)   ? Heart attack ([place])   ? Hypercholesteremia   ? Hypertension   ? Iron deficiency anemia   ? used to see hem in [place], [place].   ? Renal insufficiency   ? Thyroid disease   ? ?Past Surgical History:  ?Procedure Laterality Date  ? APPENDECTOMY    ? BREAST BIOPSY Right   ? benign  ? CORONARY ANGIOPLASTY WITH STENT PLACEMENT    ? CORONARY ARTERY BYPASS GRAFT  09/1979  ? LEFT HEART CATH AND CORS/GRAFTS ANGIOGRAPHY N/A 11/27/2018  ? Procedure: LEFT HEART CATH AND CORS/GRAFTS ANGIOGRAPHY;  Surgeon: Lorretta Harp, MD;  Location: [facility];  Service: Cardiovascular;  Laterality: N/A;  ? Moh's Surgeries    ? for squamous cell carcinoma  ? PARTIAL HYSTERECTOMY  10/1978  ? ?Patient Active Problem List  ? Diagnosis Date Noted  ? Septic encephalopathy 12/17/2021  ? Acute respiratory failure with hypoxia ([place]) 12/13/2021  ? Leukocytosis 12/13/2021  ? Acute respiratory failure with hypoxia secondary to sepsis due to pneumonia ([place]) 12/12/2021  ? Essential (primary) hypertension 12/25/2018  ? Carotid artery disease ([place]) 12/25/2018  ? Chest pain of uncertain etiology   ? Unstable angina ([place]) 11/26/2018  ? Sepsis ([place]) 01/06/2017  ? Thrombocytopenia  ([place]) 01/06/2017  ? Lower urinary tract infectious disease 01/06/2017  ? Hypothyroidism 01/06/2017  ? Anemia 01/06/2017  ? Umbilical hernia 25/42/7062  ? Coronary atherosclerosis of native coronary artery 12/11/2013  ? Old MI (myocardial infarction) 12/11/2013  ? History of tobacco use 12/11/2013  ? Angina decubitus ([place]) 12/11/2013  ? Hyperlipidemia 12/11/2013  ? Rhinitis 09/16/2012  ? ILD (interstitial lung disease) ([place]) 05/08/2012  ? Pulmonary nodule, right 05/06/2012  ? Cough 03/20/2012  ? Iron deficiency anemia   ? Chronic kidney disease, stage 3b ([place])   ? ? ?ONSET DATE: 12-12-21 ? ?REFERRING DIAG: R13.10 (ICD-10-CM) - Dysphagia, unspecified  ? ?THERAPY DIAG: Dysphagia, pharyngeal phase ? ?SUBJECTIVE: "Her blood pressure went up because she was upset" ? ?PAIN:  ?Are you having pain? Yes ?NPRS scale: 8/10 ?Pain location: neck/back/hands ? ?OBJECTIVE:  ? ?DIAGNOSTIC FINDINGS (MBSS): "Pt has a mild pharyngeal dysphagia, characterized primarily by mistimed swallowing and mildly reduced epiglottic inversion. Trace penetration as deep as to the vocal folds and intermittent aspiration occurs with thin liquids. She cannot clear aspirates with reflexive or volitional coughing despite small volume. Aspiration primarily occurs when using a straw and is not eliminated with use of a chin tuck. Pt is only able to tuck her chin a little bit due to reports of neck  pain, but even subtle movement that she can tolerate without pain is adequate to increase airway protection when also cued for small, single cup sips. Although penetration still occurs, it is more shallow and mostly clears upon completion of the swallow. Recommend regular solids and thin liquids by cup via small, single sips and use of a chin tuck as tolerated." ?  ? SLP Diet Recommendations: Regular solids;Thin liquid ? Liquid Administration via: Cup;No straw ? Medication Administration: Whole meds with puree ? Supervision: Patient able to self feed;Intermittent  supervision to cue for compensatory strategies ? Compensations: Slow rate;Small sips/bites;Clear throat intermittently;Chin tuck;Other (Comment) (chew food well; one sip at a time) ? Postural Changes: Seated upright at 90 degrees ? Oral Care Recommendations: Oral care BID ?  ?  ?RECOMMENDATIONS FROM OBJECTIVE SWALLOW STUDY (MBSS/FEES):   ?Objective swallow impairments: mistimed swallow initiation and mildly reduced epiglottic inversion  ?Objective recommended compensations: No straw, Chin Tuck, Intermittent throat clears ?  ?  ?TODAY'S TREATMENT:  ?01-16-22 ?Family charted BP numbers at home (somewhat delayed ~1 hr after RMST). Will plan to discuss BP with PCP at appointment this afternoon. Conducted ongoing education and training for EMST and swallow exercises. SLP re-assessed EMST setting for possible resistance advancement, in which current setting deemed adequate based on clinical observations and patient reports still challenging. Pt completed 25 total reps (5 sets of 5 reps) with rare min A for effort and accuracy. BP remained consistent before/after RMST. Pt consumed thin liquids via single cup sips with no overt s/sx of aspiration today.  ? ?01-12-22 ?Pt brought in EMST device and exercise tracker for swallowing exercises. Deferred EMST today due to reported elevated blood pressure at beginning of PT following ST/EMST, solely as precaution. Instructed pt to trial EMST exercises at home and document BP before/after exercises (husband feels medical setting elevates BP; SLP discussed with PT who reported elevated BP w/o EMST with recommendation to consult MD for further medication management). In review of exercise tracker, pt completed x20 reps of Effortful swallows the last two days & x10 reps of Masako swallows yesterday. Today in therapy, pt completed effortful swallows x21 and Masako swallows x11 with rare min A and extended time. Pt and husband denied any overt swallowing difficulties or overt s/sx of  aspiration at home since last ST session. Pt exhibited immediate cough x1 with cup sip of water. No other immediate s/sx of aspiration exhibited today.  ? ? ?01-09-22 ?Conducted ongoing education and training for EMST and swallow exercises. SLP re-assessed EMST setting for possible resistance advancement, in which current setting deemed adequate based on clinical observations and patient reports (still challenging). Pt completed 25 total reps (5 sets of 5 reps) with occasional min A for effort and accuracy. Pt completed effortful swallows x20 today with rare min A. SLP trialed effortful swallow with cup sip x1 resulting in cough. No other overt s/sx of aspiration with cup sips of thin liquids. Husband reported coughing has decreased indicating "not as much as used to." [employer] swallow today to target base of tongue, in which pt able to complete x5 in session with extra time.  ? ?01-05-22 ?Provide additional instruction and coaching for completion of EMST and effortful swallow. Pt reports EMST effort has decreased as she practices, will continue at current setting until next ST session at which time pressure may be increased if indicated. Performed x17 effortful swallows with rare min A for completion. Reviewed swallowing compensations recommended to increase pt's swallow safety and general aspiration  precautions. Pt tells ST she has not used a straw since MBSS.  With thin liquids, no overt s/sx of aspiration on this date.  ? ?01-02-22 ?Initiated education and training of swallow exercises (Effortful swallow) and Expiratory Muscle Strength Training (EMST) to target pharyngeal deficits and reduced cough strength to maximize swallow function and safety. SLP conducted assessment of MEP for EMST. Pt's maximum expiratory pressure today was 42 cm H2O. EMST 150 device was started at 75% MEP at 31 cm H2O. SLP provided demonstration and verbal instruction to utilize EMST device. Pt completed 5 repetitions over 2 sets  during our session with usual fading to rare cues for short, strong exhalations. HEP initiated for RMST (5 sets of 5 reps for 5 days/week) and effortful swallows of saliva (due to decreased bolus coordination

## 2022-01-17 ENCOUNTER — Encounter: Payer: Self-pay | Admitting: Cardiology

## 2022-01-17 ENCOUNTER — Ambulatory Visit: Payer: Medicare (Managed Care) | Admitting: Cardiology

## 2022-01-17 DIAGNOSIS — A419 Sepsis, unspecified organism: Secondary | ICD-10-CM | POA: Diagnosis not present

## 2022-01-17 DIAGNOSIS — L853 Xerosis cutis: Secondary | ICD-10-CM | POA: Diagnosis not present

## 2022-01-17 DIAGNOSIS — I25118 Atherosclerotic heart disease of native coronary artery with other forms of angina pectoris: Secondary | ICD-10-CM

## 2022-01-17 DIAGNOSIS — J189 Pneumonia, unspecified organism: Secondary | ICD-10-CM

## 2022-01-17 DIAGNOSIS — I10 Essential (primary) hypertension: Secondary | ICD-10-CM | POA: Diagnosis not present

## 2022-01-17 DIAGNOSIS — L821 Other seborrheic keratosis: Secondary | ICD-10-CM | POA: Diagnosis not present

## 2022-01-17 DIAGNOSIS — L570 Actinic keratosis: Secondary | ICD-10-CM | POA: Diagnosis not present

## 2022-01-17 DIAGNOSIS — L578 Other skin changes due to chronic exposure to nonionizing radiation: Secondary | ICD-10-CM | POA: Diagnosis not present

## 2022-01-17 NOTE — Patient Instructions (Addendum)
Medication Instructions:  ?Your physician recommends that you continue on your current medications as directed. Please refer to the Current Medication list given to you today.  ?*If you need a refill on your cardiac medications before your next appointment, please call your pharmacy* ? ?Lab Work: ?If you have labs (blood work) drawn today and your tests are completely normal, you will receive your results only by: ?MyChart Message (if you have MyChart) OR ?A paper copy in the mail ?If you have any lab test that is abnormal or we need to change your treatment, we will call you to review the results. ? ?Testing/Procedures: ?None ordered today. ? ?Follow-Up: ?At [facility], you and your health needs are our priority.  As part of our continuing mission to provide you with exceptional heart care, we have created designated Provider Care Teams.  These Care Teams include your primary Cardiologist (physician) and Advanced Practice Providers (APPs -  Physician Assistants and Nurse Practitioners) who all work together to provide you with the care you need, when you need it. ? ?We recommend signing up for the patient portal called "MyChart".  Sign up information is provided on this After Visit Summary.  MyChart is used to connect with patients for Virtual Visits (Telemedicine).  Patients are able to view lab/test results, encounter notes, upcoming appointments, etc.  Non-urgent messages can be sent to your provider as well.   ?To learn more about what you can do with MyChart, go to [email or web address].   ? ?Your next appointment:   ?1 year(s) ? ?The format for your next appointment:   ?In Person ? ?Provider:   ?Candee Furbish, MD { ? ? ?

## 2022-01-17 NOTE — Assessment & Plan Note (Signed)
Currently well controlled.  Continue with spironolactone/HCTZ as well.  Dr. Harrington Challenger has prescribed. ?

## 2022-01-17 NOTE — Assessment & Plan Note (Signed)
Recent hospitalization with pneumonia.  Right lung.  Improving.  She is doing pulmonary rehab. ?

## 2022-01-17 NOTE — Assessment & Plan Note (Signed)
Prior CABG 1981.  Graft RCA is down.  She does have collaterals however.  Catheterization personally reviewed.  Continue with goal-directed medical therapy including aspirin 81 mg atorvastatin 80 mg.  She is also on metoprolol XL 50 mg a day and isosorbide 90 mg a day for anginal support.  Last LDL was 76 ?

## 2022-01-19 ENCOUNTER — Encounter: Payer: Self-pay | Admitting: Physical Therapy

## 2022-01-19 ENCOUNTER — Other Ambulatory Visit: Payer: Self-pay | Admitting: Family Medicine

## 2022-01-19 ENCOUNTER — Ambulatory Visit
Admission: RE | Admit: 2022-01-19 | Discharge: 2022-01-19 | Disposition: A | Discharge status: Home / Self Care | Payer: Medicare (Managed Care) | Source: Ambulatory Visit | Attending: Family Medicine | Admitting: Family Medicine

## 2022-01-19 ENCOUNTER — Ambulatory Visit: Payer: Medicare (Managed Care) | Admitting: Physical Therapy

## 2022-01-19 ENCOUNTER — Ambulatory Visit: Payer: Medicare (Managed Care) | Admitting: Speech Pathology

## 2022-01-19 DIAGNOSIS — M19011 Primary osteoarthritis, right shoulder: Secondary | ICD-10-CM | POA: Diagnosis not present

## 2022-01-19 DIAGNOSIS — J189 Pneumonia, unspecified organism: Secondary | ICD-10-CM

## 2022-01-19 DIAGNOSIS — M47814 Spondylosis without myelopathy or radiculopathy, thoracic region: Secondary | ICD-10-CM | POA: Diagnosis not present

## 2022-01-19 DIAGNOSIS — M19012 Primary osteoarthritis, left shoulder: Secondary | ICD-10-CM | POA: Diagnosis not present

## 2022-01-19 DIAGNOSIS — M6281 Muscle weakness (generalized): Secondary | ICD-10-CM

## 2022-01-19 DIAGNOSIS — Z8701 Personal history of pneumonia (recurrent): Secondary | ICD-10-CM | POA: Diagnosis not present

## 2022-01-19 DIAGNOSIS — R1313 Dysphagia, pharyngeal phase: Secondary | ICD-10-CM

## 2022-01-19 DIAGNOSIS — R2681 Unsteadiness on feet: Secondary | ICD-10-CM

## 2022-01-19 DIAGNOSIS — R2689 Other abnormalities of gait and mobility: Secondary | ICD-10-CM

## 2022-01-19 NOTE — Therapy (Signed)
?OUTPATIENT SPEECH LANGUAGE PATHOLOGY TREATMENT NOTE ? ? ?Patient Name: Cindy [name] ?MRN: [id number] ?DOB:07/09/1932, 88 y.o., female ?Today's Date: 01/19/2022 ? ?PCP: Lawerance Cruel, MD ?REFERRING PROVIDER: Lawerance Cruel, MD ? ? End of Session - 01/19/22 1236   ? ? Visit Number 7   ? Number of Visits 13   ? Date for SLP Re-Evaluation 02/09/22   ? Authorization Type [employer]   ? SLP Start Time 1230   ? SLP Stop Time  1308   ? SLP Time Calculation (min) 38 min   ? Activity Tolerance Patient tolerated treatment well   ? ?  ?  ? ?  ? ? ? ? ?Past Medical History:  ?Diagnosis Date  ? Asthma   ? Bronchitis   ? COPD (chronic obstructive pulmonary disease) ([place])   ? Coronary artery disease   ? MI age 45  ? GERD (gastroesophageal reflux disease)   ? Heart attack ([place])   ? Hypercholesteremia   ? Hypertension   ? Iron deficiency anemia   ? used to see hem in [place], [place].   ? Renal insufficiency   ? Thyroid disease   ? ?Past Surgical History:  ?Procedure Laterality Date  ? APPENDECTOMY    ? BREAST BIOPSY Right   ? benign  ? CORONARY ANGIOPLASTY WITH STENT PLACEMENT    ? CORONARY ARTERY BYPASS GRAFT  09/1979  ? LEFT HEART CATH AND CORS/GRAFTS ANGIOGRAPHY N/A 11/27/2018  ? Procedure: LEFT HEART CATH AND CORS/GRAFTS ANGIOGRAPHY;  Surgeon: Lorretta Harp, MD;  Location: [facility];  Service: Cardiovascular;  Laterality: N/A;  ? Moh's Surgeries    ? for squamous cell carcinoma  ? PARTIAL HYSTERECTOMY  10/1978  ? ?Patient Active Problem List  ? Diagnosis Date Noted  ? Septic encephalopathy 12/17/2021  ? Acute respiratory failure with hypoxia ([place]) 12/13/2021  ? Leukocytosis 12/13/2021  ? Acute respiratory failure with hypoxia secondary to sepsis due to pneumonia ([place]) 12/12/2021  ? Essential (primary) hypertension 12/25/2018  ? Carotid artery disease ([place]) 12/25/2018  ? Chest pain of uncertain etiology   ? Unstable angina ([place]) 11/26/2018  ? Sepsis ([place]) 01/06/2017  ? Thrombocytopenia ([place]) 01/06/2017  ? Lower  urinary tract infectious disease 01/06/2017  ? Hypothyroidism 01/06/2017  ? Anemia 01/06/2017  ? Umbilical hernia 38/45/3646  ? Coronary atherosclerosis of native coronary artery 12/11/2013  ? Old MI (myocardial infarction) 12/11/2013  ? History of tobacco use 12/11/2013  ? Angina decubitus ([place]) 12/11/2013  ? Hyperlipidemia 12/11/2013  ? Rhinitis 09/16/2012  ? ILD (interstitial lung disease) ([place]) 05/08/2012  ? Pulmonary nodule, right 05/06/2012  ? Cough 03/20/2012  ? Iron deficiency anemia   ? Chronic kidney disease, stage 3b ([place])   ? ? ?ONSET DATE: 12-12-21 ? ?REFERRING DIAG: R13.10 (ICD-10-CM) - Dysphagia, unspecified  ? ?THERAPY DIAG: Dysphagia, pharyngeal phase ? ?SUBJECTIVE: "I am doing good" ? ?PAIN:  ?Are you having pain? Yes ?NPRS scale: 8/10 ?Pain location: neck/back/hands ? ?OBJECTIVE:  ? ?DIAGNOSTIC FINDINGS (MBSS): "Pt has a mild pharyngeal dysphagia, characterized primarily by mistimed swallowing and mildly reduced epiglottic inversion. Trace penetration as deep as to the vocal folds and intermittent aspiration occurs with thin liquids. She cannot clear aspirates with reflexive or volitional coughing despite small volume. Aspiration primarily occurs when using a straw and is not eliminated with use of a chin tuck. Pt is only able to tuck her chin a little bit due to reports of neck pain, but even subtle movement that she can  tolerate without pain is adequate to increase airway protection when also cued for small, single cup sips. Although penetration still occurs, it is more shallow and mostly clears upon completion of the swallow. Recommend regular solids and thin liquids by cup via small, single sips and use of a chin tuck as tolerated." ?  ? SLP Diet Recommendations: Regular solids;Thin liquid ? Liquid Administration via: Cup;No straw ? Medication Administration: Whole meds with puree ? Supervision: Patient able to self feed;Intermittent supervision to cue for compensatory strategies ?  Compensations: Slow rate;Small sips/bites;Clear throat intermittently;Chin tuck;Other (Comment) (chew food well; one sip at a time) ? Postural Changes: Seated upright at 90 degrees ? Oral Care Recommendations: Oral care BID ?  ?  ?RECOMMENDATIONS FROM OBJECTIVE SWALLOW STUDY (MBSS/FEES):   ?Objective swallow impairments: mistimed swallow initiation and mildly reduced epiglottic inversion  ?Objective recommended compensations: No straw, Chin Tuck, Intermittent throat clears ?  ?  ?TODAY'S TREATMENT:  ?01-19-22 ?Pt able to teach back with mod A, for details, current HEP. Demonstrates mod I. Reports to typically doing exercises 1x a day. Is doing hard swallow- ~25, Masako ~12-15, EMST 5x5. Pt reports perceived effort level 8/10. Tells ST the exercises "are so, so hard. They hurt." Pt is getting a chest x-ray this afternoon. ST leads pt in performing 6 masakos and 10 effortful swallows this session. Pt consumed thin liquids via single cup sips with no overt s/sx of aspiration today.  ? ?01-16-22 ?Family charted BP numbers at home (somewhat delayed ~1 hr after RMST). Will plan to discuss BP with PCP at appointment this afternoon. Conducted ongoing education and training for EMST and swallow exercises. SLP re-assessed EMST setting for possible resistance advancement, in which current setting deemed adequate based on clinical observations and patient reports still challenging. Pt completed 25 total reps (5 sets of 5 reps) with rare min A for effort and accuracy. BP remained consistent before/after RMST. Pt consumed thin liquids via single cup sips with no overt s/sx of aspiration today.  ? ?01-12-22 ?Pt brought in EMST device and exercise tracker for swallowing exercises. Deferred EMST today due to reported elevated blood pressure at beginning of PT following ST/EMST, solely as precaution. Instructed pt to trial EMST exercises at home and document BP before/after exercises (husband feels medical setting elevates BP; SLP  discussed with PT who reported elevated BP w/o EMST with recommendation to consult MD for further medication management). In review of exercise tracker, pt completed x20 reps of Effortful swallows the last two days & x10 reps of Masako swallows yesterday. Today in therapy, pt completed effortful swallows x21 and Masako swallows x11 with rare min A and extended time. Pt and husband denied any overt swallowing difficulties or overt s/sx of aspiration at home since last ST session. Pt exhibited immediate cough x1 with cup sip of water. No other immediate s/sx of aspiration exhibited today.  ? ?  ?PATIENT EDUCATION: ?Education details: see above ?Person educated: Patient and Spouse ?Education method: Explanation, Demonstration, Verbal cues, Handout ?Education comprehension: verbalized understanding/demonstration and needs further education ?  ?  ?ASSESSMENT: ?  ?CLINICAL IMPRESSION: ?Patient is a 88 y.o. female who was seen today for pharyngeal dysphagia. Pt recently hospitalized with sepsis and hypoxia secondary to RLL PNA. MBSS revealed mild pharyngeal dysphagia with recommendation for no straw and chin tuck. Conducted ongoing education and training for EMST and swallow exercises to target pharyngeal deficits and maximize swallow function/cough production.   ?  ?OBJECTIVE IMPAIRMENTS include dysphagia. These impairments are  limiting patient from safety when swallowing. ?Factors affecting potential to achieve goals and functional outcome are ability to learn/carryover information and cooperation/participation level. Patient will benefit from skilled SLP services to address above impairments and improve overall function. ?  ?REHAB POTENTIAL: Fair - cognitive component ?  ?  ?GOALS: ?Goals reviewed with patient? Yes ?  ?SHORT TERM GOALS: Target date: 01/26/2022 ?  ?Pt will verbalize and/or demonstrate recommended compensatory swallow strategies to reduce risk of aspiration given rare min A over 2 sessions  ?Baseline:  01-09-22, 01-16-22 ?Goal status: MET ?  ?2.  Pt will complete recommended swallow exercises to target pharyngeal deficits given usual mod A over 2 sessions  ?Baseline: 01-09-22, 01-12-22 ?Goal status: MET ?  ?3.  Pt will parti

## 2022-01-19 NOTE — Therapy (Signed)
?OUTPATIENT PHYSICAL THERAPY TREATMENT NOTE ? ? ?Patient Name: Cindy [name] ?MRN: [id number] ?DOB:Jan 31, 1932, 88 y.o., female ?Today's Date: 01/19/2022 ? ?PCP: Lawerance Cruel, MD ?REFERRING PROVIDER: Lawerance Cruel, MD ? ? PT End of Session - 01/19/22 1318   ? ? Visit Number 7   ? Number of Visits 9   ? Date for PT Re-Evaluation 01/28/22   ? Authorization Type [employer]   ? PT Start Time 2836   ? PT Stop Time 1358   ? PT Time Calculation (min) 41 min   ? Equipment Utilized During Treatment Gait belt   ? Activity Tolerance Patient tolerated treatment well;Patient limited by fatigue   ? Behavior During Therapy [facility] for tasks assessed/performed   ? ?  ?  ? ?  ? ? ?Past Medical History:  ?Diagnosis Date  ? Asthma   ? Bronchitis   ? COPD (chronic obstructive pulmonary disease) ([place])   ? Coronary artery disease   ? MI age 74  ? GERD (gastroesophageal reflux disease)   ? Heart attack ([place])   ? Hypercholesteremia   ? Hypertension   ? Iron deficiency anemia   ? used to see hem in [place], [place].   ? Renal insufficiency   ? Thyroid disease   ? ?Past Surgical History:  ?Procedure Laterality Date  ? APPENDECTOMY    ? BREAST BIOPSY Right   ? benign  ? CORONARY ANGIOPLASTY WITH STENT PLACEMENT    ? CORONARY ARTERY BYPASS GRAFT  09/1979  ? LEFT HEART CATH AND CORS/GRAFTS ANGIOGRAPHY N/A 11/27/2018  ? Procedure: LEFT HEART CATH AND CORS/GRAFTS ANGIOGRAPHY;  Surgeon: Lorretta Harp, MD;  Location: [facility];  Service: Cardiovascular;  Laterality: N/A;  ? Moh's Surgeries    ? for squamous cell carcinoma  ? PARTIAL HYSTERECTOMY  10/1978  ? ?Patient Active Problem List  ? Diagnosis Date Noted  ? Septic encephalopathy 12/17/2021  ? Acute respiratory failure with hypoxia ([place]) 12/13/2021  ? Leukocytosis 12/13/2021  ? Acute respiratory failure with hypoxia secondary to sepsis due to pneumonia ([place]) 12/12/2021  ? Essential (primary) hypertension 12/25/2018  ? Carotid artery disease ([place]) 12/25/2018  ? Chest pain of  uncertain etiology   ? Unstable angina ([place]) 11/26/2018  ? Sepsis ([place]) 01/06/2017  ? Thrombocytopenia ([place]) 01/06/2017  ? Lower urinary tract infectious disease 01/06/2017  ? Hypothyroidism 01/06/2017  ? Anemia 01/06/2017  ? Umbilical hernia 62/94/7654  ? Coronary atherosclerosis of native coronary artery 12/11/2013  ? Old MI (myocardial infarction) 12/11/2013  ? History of tobacco use 12/11/2013  ? Angina decubitus ([place]) 12/11/2013  ? Hyperlipidemia 12/11/2013  ? Rhinitis 09/16/2012  ? ILD (interstitial lung disease) ([place]) 05/08/2012  ? Pulmonary nodule, right 05/06/2012  ? Cough 03/20/2012  ? Iron deficiency anemia   ? Chronic kidney disease, stage 3b ([place])   ? ? ?REFERRING DIAG: Other abnormalities of gait and mobility (R26.89);Muscle weakness (generalized) (M62.81)  ? ?THERAPY DIAG:  ?Other abnormalities of gait and mobility ? ?Muscle weakness (generalized) ? ?Unsteadiness on feet ? ?PERTINENT HISTORY: Asthma, bronchitis, COPD, CAD (MI at age 14), HTN, renal insufficiency, and thyroid disease. ? ?PRECAUTIONS: Fall ? ?SUBJECTIVE: No falls. Per pt's husband, reports that pt is now doing more around the house that she wasn't able to do before.  ? ?PAIN:  ?Are you having pain? Yes: NPRS scale: 8/10 ?Pain location: neck and whole back ?Pain description: achy and sore ?Aggravating factors: hurts with all movements ?Relieving factors: heat helps neck temporarily, tramadol  helps back mildly ?Remains unchanged from prior visit ? ?Today's Treatment: ?Warmup on NuStep w/ BLE only for increased strengthening, activity tolerance at level 4.0 for 8 minutes. Goal of 40 steps per min, achieved 45 avg steps per min. ? ? ? ?GAIT: ?Gait pattern: step through pattern ?Distance walked: 53'  ?Assistive device utilized: Single point cane with 4 prong quad tip ?Level of assistance: SBA ?Comments: Initial cues for sequencing on R side and making sure pt brings cane forward with LLE. Pt with improved sequencing during today's session  after initial cues. Worked on practicing over unlevel surfaces such as floor mats with pt tending to ambulate with decr step length with RLE, w/ cues for continuous gait pattern. Will trial outdoor gait next session over paved surfaces. Unable to trial today due to it being too cold outside. Pt feels like she does not need the gait indoors, but feels like it would be more beneficial outdoors.  ? ?NMR: ?(Min guard as needed for balance for activities below) ?On blue mat:  ?-forwards marching down and back x3 reps; cues for slowed pace and SLS w/ 3 sec hold, intermittent UE support  ?-alternating forward SLS taps to cones x12 reps each side, intermittent UE support  ?-cross body alternating SLS taps to floor gumdrops x12 reps each side, pt able to perform without UE support  ?-retro gait x3 reps down and back working on no UE support; cued for step length bilat. ?Sit to stands on red balance beam - using no UE support, upon first rep pt needing min  A in standing for balance recovery  ?On red balance beam: with feet apart x10 reps head turns, x10 reps head nods; incr postural sway w/ pt needing chair at times to maintain balance.  ? ? ?  ? ?PATIENT EDUCATION: ?Education details:  Continue w/ current HEP.  ?Person educated: Patient & Spouse ?Education method: Explanation ?Education comprehension: verbalized understanding ?  ?  ?HOME EXERCISE PROGRAM: ?[id number] ? ? ?  ?ASSESSMENT: ?  ?CLINICAL IMPRESSION: ?Continued working on gait training with [place] w/ quad tip. Pt demonstrated better carryover today and sequencing with cane, however pt challenged over compliant surfaces. Will practice outdoor gait when weather is nicer. Remainder of session continued to focus on balance strategies with trying to decr UE support on compliant surfaces and SLS. Will continue to progress towards LTGs.  ? ?  ?  ?OBJECTIVE IMPAIRMENTS Abnormal gait, decreased activity tolerance, decreased balance, decreased endurance, decreased strength,  postural dysfunction, and pain.  ?  ?ACTIVITY LIMITATIONS meal prep and medication management.  ?  ?PERSONAL FACTORS Behavior pattern, Past/current experiences, and 3+ comorbidities:  asthma, bronchitis, COPD, CAD, HTN, renal insufficiency, and thyroid disease.  are also affecting patient's functional outcome.  ?  ?  ?REHAB POTENTIAL: Good ?  ?CLINICAL DECISION MAKING: Stable/uncomplicated ?  ?EVALUATION COMPLEXITY: Low ?  ?  ?GOALS: ?Goals reviewed with patient? Yes ?  ?SHORT TERM GOALS: ALL STGS = LTGS ?  ?LONG TERM GOALS: ALL LTGS DUE: Target date: 01/28/2022 ?  ?Pt will be independent with final HEP in order to build upon functional gains made in therapy. ?Baseline: Does not have HEP. ?Goal status: INITIAL ?  ?2.  Pt will improve FGA to at least 18/30 in order to demo decr fall risk.     ?Baseline: 14/30         ?Goal status: INITIAL ?  ?3.  Pt will improve gait speed with no AD vs. LRAD to at least  2.4 ft/sec in order to demo improved community mobility.  ?  ?Baseline: 1.97 ft/sec with no AD  ?Goal status: INITIAL ?  ?4.  Pt will ambulate at least 500' over level indoor and outdoor surfaces with no AD vs. LRAD with supervision in order to demo improved community mobility.            ?Baseline: No AD, but might benefit from use of cane over outdoor surfaces.  ?Goal status: INITIAL ?  ?5.  Pt will verbalize understanding of fall prevention in the home. ?Baseline:  ?Goal status:  ?  ?  ?PLAN: ?PT FREQUENCY: 2x/week ?  ?PT DURATION: 4 weeks ?  ?PLANNED INTERVENTIONS: Therapeutic exercises, Therapeutic activity, Neuromuscular re-education, Balance training, Gait training, Patient/Family education, Stair training, Vestibular training, and DME instructions ?  ?PLAN FOR NEXT SESSION: start checking LTGs. Work on Secondary school teacher, Cytogeneticist. Gait training-inc speed, head turns, dual task, inc foot clearance.  Gait outdoors with cane if weather permits. corporate cognitive challenge to balance tasks.   NuStep for endurance.  ? ? ? ? ? ?Arliss Journey, PT, DPT ?01/19/2022, 3:02 PM ? ?   ?

## 2022-01-23 ENCOUNTER — Ambulatory Visit: Payer: Medicare (Managed Care)

## 2022-01-23 ENCOUNTER — Encounter: Payer: Self-pay | Admitting: Physical Therapy

## 2022-01-23 ENCOUNTER — Ambulatory Visit: Payer: Medicare (Managed Care) | Attending: Family Medicine | Admitting: Physical Therapy

## 2022-01-23 DIAGNOSIS — R2689 Other abnormalities of gait and mobility: Secondary | ICD-10-CM | POA: Diagnosis not present

## 2022-01-23 DIAGNOSIS — R2681 Unsteadiness on feet: Secondary | ICD-10-CM | POA: Diagnosis not present

## 2022-01-23 DIAGNOSIS — R1313 Dysphagia, pharyngeal phase: Secondary | ICD-10-CM

## 2022-01-23 DIAGNOSIS — M6281 Muscle weakness (generalized): Secondary | ICD-10-CM | POA: Insufficient documentation

## 2022-01-23 NOTE — Therapy (Signed)
?OUTPATIENT SPEECH LANGUAGE PATHOLOGY TREATMENT NOTE ? ? ?Patient Name: Cindy [name] ?MRN: [id number] ?DOB:October 05, 1932, 88 y.o., female ?Today's Date: 01/23/2022 ? ?PCP: Lawerance Cruel, MD ?REFERRING PROVIDER: Lawerance Cruel, MD ? ? End of Session - 01/23/22 1315   ? ? Visit Number 8   ? Number of Visits 13   ? Date for SLP Re-Evaluation 02/09/22   ? Authorization Type [employer]   ? SLP Start Time 4166   ? SLP Stop Time  1400   ? SLP Time Calculation (min) 43 min   ? Activity Tolerance Patient tolerated treatment well   ? ?  ?  ? ?  ? ? ? ? ? ?Past Medical History:  ?Diagnosis Date  ? Asthma   ? Bronchitis   ? COPD (chronic obstructive pulmonary disease) ([place])   ? Coronary artery disease   ? MI age 69  ? GERD (gastroesophageal reflux disease)   ? Heart attack ([place])   ? Hypercholesteremia   ? Hypertension   ? Iron deficiency anemia   ? used to see hem in [place], [place].   ? Renal insufficiency   ? Thyroid disease   ? ?Past Surgical History:  ?Procedure Laterality Date  ? APPENDECTOMY    ? BREAST BIOPSY Right   ? benign  ? CORONARY ANGIOPLASTY WITH STENT PLACEMENT    ? CORONARY ARTERY BYPASS GRAFT  09/1979  ? LEFT HEART CATH AND CORS/GRAFTS ANGIOGRAPHY N/A 11/27/2018  ? Procedure: LEFT HEART CATH AND CORS/GRAFTS ANGIOGRAPHY;  Surgeon: Lorretta Harp, MD;  Location: [facility];  Service: Cardiovascular;  Laterality: N/A;  ? Moh's Surgeries    ? for squamous cell carcinoma  ? PARTIAL HYSTERECTOMY  10/1978  ? ?Patient Active Problem List  ? Diagnosis Date Noted  ? Septic encephalopathy 12/17/2021  ? Acute respiratory failure with hypoxia ([place]) 12/13/2021  ? Leukocytosis 12/13/2021  ? Acute respiratory failure with hypoxia secondary to sepsis due to pneumonia ([place]) 12/12/2021  ? Essential (primary) hypertension 12/25/2018  ? Carotid artery disease ([place]) 12/25/2018  ? Chest pain of uncertain etiology   ? Unstable angina ([place]) 11/26/2018  ? Sepsis ([place]) 01/06/2017  ? Thrombocytopenia ([place]) 01/06/2017  ? Lower  urinary tract infectious disease 01/06/2017  ? Hypothyroidism 01/06/2017  ? Anemia 01/06/2017  ? Umbilical hernia 04/20/1600  ? Coronary atherosclerosis of native coronary artery 12/11/2013  ? Old MI (myocardial infarction) 12/11/2013  ? History of tobacco use 12/11/2013  ? Angina decubitus ([place]) 12/11/2013  ? Hyperlipidemia 12/11/2013  ? Rhinitis 09/16/2012  ? ILD (interstitial lung disease) ([place]) 05/08/2012  ? Pulmonary nodule, right 05/06/2012  ? Cough 03/20/2012  ? Iron deficiency anemia   ? Chronic kidney disease, stage 3b ([place])   ? ? ?ONSET DATE: 12-12-21 ? ?REFERRING DIAG: R13.10 (ICD-10-CM) - Dysphagia, unspecified  ? ?THERAPY DIAG: Dysphagia, pharyngeal phase ? ?SUBJECTIVE: "I've been busy" ? ?PAIN:  ?Are you having pain? Yes ?NPRS scale: 8/10 ?Pain location: neck/back/hands ? ?OBJECTIVE:  ? ?DIAGNOSTIC FINDINGS (MBSS): "Pt has a mild pharyngeal dysphagia, characterized primarily by mistimed swallowing and mildly reduced epiglottic inversion. Trace penetration as deep as to the vocal folds and intermittent aspiration occurs with thin liquids. She cannot clear aspirates with reflexive or volitional coughing despite small volume. Aspiration primarily occurs when using a straw and is not eliminated with use of a chin tuck. Pt is only able to tuck her chin a little bit due to reports of neck pain, but even subtle movement that she can  tolerate without pain is adequate to increase airway protection when also cued for small, single cup sips. Although penetration still occurs, it is more shallow and mostly clears upon completion of the swallow. Recommend regular solids and thin liquids by cup via small, single sips and use of a chin tuck as tolerated." ?  ? SLP Diet Recommendations: Regular solids;Thin liquid ? Liquid Administration via: Cup;No straw ? Medication Administration: Whole meds with puree ? Supervision: Patient able to self feed;Intermittent supervision to cue for compensatory strategies ?  Compensations: Slow rate;Small sips/bites;Clear throat intermittently;Chin tuck;Other (Comment) (chew food well; one sip at a time) ? Postural Changes: Seated upright at 90 degrees ? Oral Care Recommendations: Oral care BID ? ?CXR (01-19-22)  Low lung volumes with mild bibasilar atelectasis and or scarring again noted. Previously noted right base infiltrate no longer identified. ?  ?  ?RECOMMENDATIONS FROM OBJECTIVE SWALLOW STUDY (MBSS/FEES):   ?Objective swallow impairments: mistimed swallow initiation and mildly reduced epiglottic inversion  ?Objective recommended compensations: No straw, Chin Tuck, Intermittent throat clears ?  ?  ?TODAY'S TREATMENT:  ?01-23-22: Recent CXR (see above) indicated right base infiltrate no longer identified. No subjective swallow dysfunction reported since last ST visit. Pt continues to complete HEP albeit somewhat inconsistent (only completed Saturday over weekend due to busy schedule). SLP recommending continuing EMST s/p ST discharge to maximize cough production. Pt completed EMST x25 with rare min A. SLP educated 3 pillars of aspiration PNA and s/sx of aspiration PNA to monitor, with handout provided. Pt exhibited rare coughs following sips of water, with perceived improved cough strength compared to initial baseline.  ? ?01-19-22: Pt able to teach back with mod A, for details, current HEP. Demonstrates mod I. Reports to typically doing exercises 1x a day. Is doing hard swallow- ~25, Masako ~12-15, EMST 5x5. Pt reports perceived effort level 8/10. Tells ST the exercises "are so, so hard. They hurt." Pt is getting a chest x-ray this afternoon. ST leads pt in performing 6 masakos and 10 effortful swallows this session. Pt consumed thin liquids via single cup sips with no overt s/sx of aspiration today.  ? ?01-16-22: Family charted BP numbers at home (somewhat delayed ~1 hr after RMST). Will plan to discuss BP with PCP at appointment this afternoon. Conducted ongoing education and  training for EMST and swallow exercises. SLP re-assessed EMST setting for possible resistance advancement, in which current setting deemed adequate based on clinical observations and patient reports still challenging. Pt completed 25 total reps (5 sets of 5 reps) with rare min A for effort and accuracy. BP remained consistent before/after RMST. Pt consumed thin liquids via single cup sips with no overt s/sx of aspiration today.  ? ?01-12-22: Pt brought in EMST device and exercise tracker for swallowing exercises. Deferred EMST today due to reported elevated blood pressure at beginning of PT following ST/EMST, solely as precaution. Instructed pt to trial EMST exercises at home and document BP before/after exercises (husband feels medical setting elevates BP; SLP discussed with PT who reported elevated BP w/o EMST with recommendation to consult MD for further medication management). In review of exercise tracker, pt completed x20 reps of Effortful swallows the last two days & x10 reps of Masako swallows yesterday. Today in therapy, pt completed effortful swallows x21 and Masako swallows x11 with rare min A and extended time. Pt and husband denied any overt swallowing difficulties or overt s/sx of aspiration at home since last ST session. Pt exhibited immediate cough x1 with cup  sip of water. No other immediate s/sx of aspiration exhibited today.  ? ?  ?PATIENT EDUCATION: ?Education details: see above ?Person educated: Patient and Spouse ?Education method: Explanation, Demonstration, Verbal cues, Handout ?Education comprehension: verbalized understanding/demonstration and needs further education ?  ?  ?ASSESSMENT: ?  ?CLINICAL IMPRESSION: ?Patient is a 88 y.o. female who was seen today for pharyngeal dysphagia. Pt recently hospitalized with sepsis and hypoxia secondary to RLL PNA. MBSS revealed mild pharyngeal dysphagia with recommendation for no straw and chin tuck. Conducted ongoing education and training for EMST and  swallow exercises to target pharyngeal deficits and maximize swallow function/cough production.   ?  ?OBJECTIVE IMPAIRMENTS include dysphagia. These impairments are limiting patient from safety when swallowing. ?[place]

## 2022-01-23 NOTE — Patient Instructions (Signed)
Signs of Aspiration Pneumonia  ? ?Chest pain/tightness ?Fever (can be low grade) ?Cough  ?With foul-smelling phlegm (sputum) ?With sputum containing pus or blood ?With greenish sputum ?Fatigue  ?Shortness of breath  ?Wheezing  ? ?**IF YOU HAVE THESE SIGNS, CONTACT YOUR DOCTOR OR GO TO THE EMERGENCY DEPARTMENT OR URGENT CARE AS SOON AS POSSIBLE**  ? ? ?After Speech Therapy, continue with EMST 150 exercises (5 sets of 5 repetitions, 5 days a week). You can start to fade out your swallow exercises. ? ?Let your doctor know if you have any more trouble swallowing and you can always be referred back to speech therapy.  ? ? ? ?

## 2022-01-23 NOTE — Therapy (Signed)
?OUTPATIENT PHYSICAL THERAPY TREATMENT NOTE ? ? ?Patient Name: Cindy [name] ?MRN: [id number] ?DOB:21-Feb-1932, 88 y.o., female ?Today's Date: 01/23/2022 ? ?PCP: Lawerance Cruel, MD ?REFERRING PROVIDER: Lawerance Cruel, MD ? ? PT End of Session - 01/23/22 1404   ? ? Visit Number 8   ? Number of Visits 9   ? Date for PT Re-Evaluation 01/28/22   ? Authorization Type [employer]   ? PT Start Time 1402   ? PT Stop Time 1031   ? PT Time Calculation (min) 40 min   ? Equipment Utilized During Treatment Gait belt   ? Activity Tolerance Patient tolerated treatment well;Patient limited by fatigue   ? Behavior During Therapy [facility] for tasks assessed/performed   ? ?  ?  ? ?  ? ? ?Past Medical History:  ?Diagnosis Date  ? Asthma   ? Bronchitis   ? COPD (chronic obstructive pulmonary disease) ([place])   ? Coronary artery disease   ? MI age 26  ? GERD (gastroesophageal reflux disease)   ? Heart attack ([place])   ? Hypercholesteremia   ? Hypertension   ? Iron deficiency anemia   ? used to see hem in [place], [place].   ? Renal insufficiency   ? Thyroid disease   ? ?Past Surgical History:  ?Procedure Laterality Date  ? APPENDECTOMY    ? BREAST BIOPSY Right   ? benign  ? CORONARY ANGIOPLASTY WITH STENT PLACEMENT    ? CORONARY ARTERY BYPASS GRAFT  09/1979  ? LEFT HEART CATH AND CORS/GRAFTS ANGIOGRAPHY N/A 11/27/2018  ? Procedure: LEFT HEART CATH AND CORS/GRAFTS ANGIOGRAPHY;  Surgeon: Lorretta Harp, MD;  Location: [facility];  Service: Cardiovascular;  Laterality: N/A;  ? Moh's Surgeries    ? for squamous cell carcinoma  ? PARTIAL HYSTERECTOMY  10/1978  ? ?Patient Active Problem List  ? Diagnosis Date Noted  ? Septic encephalopathy 12/17/2021  ? Acute respiratory failure with hypoxia ([place]) 12/13/2021  ? Leukocytosis 12/13/2021  ? Acute respiratory failure with hypoxia secondary to sepsis due to pneumonia ([place]) 12/12/2021  ? Essential (primary) hypertension 12/25/2018  ? Carotid artery disease ([place]) 12/25/2018  ? Chest pain of  uncertain etiology   ? Unstable angina ([place]) 11/26/2018  ? Sepsis ([place]) 01/06/2017  ? Thrombocytopenia ([place]) 01/06/2017  ? Lower urinary tract infectious disease 01/06/2017  ? Hypothyroidism 01/06/2017  ? Anemia 01/06/2017  ? Umbilical hernia 59/45/8592  ? Coronary atherosclerosis of native coronary artery 12/11/2013  ? Old MI (myocardial infarction) 12/11/2013  ? History of tobacco use 12/11/2013  ? Angina decubitus ([place]) 12/11/2013  ? Hyperlipidemia 12/11/2013  ? Rhinitis 09/16/2012  ? ILD (interstitial lung disease) ([place]) 05/08/2012  ? Pulmonary nodule, right 05/06/2012  ? Cough 03/20/2012  ? Iron deficiency anemia   ? Chronic kidney disease, stage 3b ([place])   ? ? ?REFERRING DIAG: Other abnormalities of gait and mobility (R26.89);Muscle weakness (generalized) (M62.81)  ? ?THERAPY DIAG:  ?Other abnormalities of gait and mobility ? ?Muscle weakness (generalized) ? ?Unsteadiness on feet ? ?PERTINENT HISTORY: Asthma, bronchitis, COPD, CAD (MI at age 36), HTN, renal insufficiency, and thyroid disease. ? ?PRECAUTIONS: Fall ? ?SUBJECTIVE: No falls. Asking about the quad tip for a cane.  ? ?PAIN:  ?Are you having pain? Yes: NPRS scale: 6/10 ?Pain location: neck and whole back ?Pain description: achy and sore ?Aggravating factors: hurts with all movements ?Relieving factors: heat helps neck temporarily, tramadol helps back mildly ? ? ?Today's Treatment: ? ? ? ? ?GAIT: ?  Gait pattern: step through pattern ?Distance walked: 230' indoors, 500' outdoors  ?Assistive device utilized: Single point cane with 4 prong quad tip ?Level of assistance: SBA ?Comments: Initial cues for sequencing on R side and making sure pt brings cane forward with LLE. Cues for looking ahead at times to look at environment during gait. Performed outside over paved surfaces with pt doing well with sequencing when focused on the task of walking. When communicating with therapist, pt lost sequencing at times, but was able to correct. Educated on making sure  that pt focused on the task of walking when using cane vs. Dual tasking. Pt reporting feeling more steady with use of cane on outdoor surfaces.  ? ?Showed pt and pt's husband where to purchase 4 prong tip for cane at home.  ? ?Gait speed: 18.88 sec = 1.74 ft/sec with no AD (pt reports that this is her normal walking speed and tends to walk slower due to caution w/ her balance) ? ?NMR: ?At bottom of staircase on air ex: ?-alternating step taps to 6" step x10 reps each leg, then repeated to 2nd step, pt more fearful of performing, needing encouragement and intermittent UE support as needed.  ?-step up/up and down/down to 6" step, alternating legs, x10 reps each side, UE support > none, cues at times for sequencing.  ?  ? ? ?  ? ?PATIENT EDUCATION: ?Education details:  Plan to check remainder of goals at next session, where to purchase 4 prong tip for SPC.  ?Person educated: Patient & Spouse ?Education method: Explanation ?Education comprehension: verbalized understanding ?  ?  ?HOME EXERCISE PROGRAM: ?[id number] ? ? ?  ?ASSESSMENT: ?  ?CLINICAL IMPRESSION: ?Worked on gait training w/ SPC w/ quad tip over paved outdoor surfaces today. Pt able to perform sequencing well today when focused on the task of walking. However, when conversing with therapist she lost it at times, but was able to self correct. Showed purchase options for quad tip. Pt has met LTG #4. Pt did not meet LTG #3 in regards to gait speed w/ no AD. Performed in 1.74 ft/sec today (previously 1.97 ft/sec). Pt reporting she might be walking slower today since legs are more tired and she is more cautious w/ her balance. Will continue to progress towards LTGs.  ? ?  ?  ?OBJECTIVE IMPAIRMENTS Abnormal gait, decreased activity tolerance, decreased balance, decreased endurance, decreased strength, postural dysfunction, and pain.  ?  ?ACTIVITY LIMITATIONS meal prep and medication management.  ?  ?PERSONAL FACTORS Behavior pattern, Past/current experiences, and 3+  comorbidities:  asthma, bronchitis, COPD, CAD, HTN, renal insufficiency, and thyroid disease.  are also affecting patient's functional outcome.  ?  ?  ?REHAB POTENTIAL: Good ?  ?CLINICAL DECISION MAKING: Stable/uncomplicated ?  ?EVALUATION COMPLEXITY: Low ?  ?  ?GOALS: ?Goals reviewed with patient? Yes ?  ?SHORT TERM GOALS: ALL STGS = LTGS ?  ?LONG TERM GOALS: ALL LTGS DUE: Target date: 01/28/2022 ?  ?Pt will be independent with final HEP in order to build upon functional gains made in therapy. ?Baseline: Does not have HEP. ?Goal status: INITIAL ?  ?2.  Pt will improve FGA to at least 18/30 in order to demo decr fall risk.     ?Baseline: 14/30         ?Goal status: INITIAL ?  ?3.  Pt will improve gait speed with no AD vs. LRAD to at least 2.4 ft/sec in order to demo improved community mobility.  ?  ?Baseline: 1.97 ft/sec  with no AD ; 18.88 sec = 1.74 ft/sec ?Goal status: NOT MET ?  ?4.  Pt will ambulate at least 500' over level indoor and outdoor surfaces with no AD vs. LRAD with supervision in order to demo improved community mobility.            ?Baseline: achieved with SPC with quad tip on 01/23/22.  ?Goal status: MET ?  ?5.  Pt will verbalize understanding of fall prevention in the home. ?Baseline:  ?Goal status:  ?  ?  ?PLAN: ?PT FREQUENCY: 2x/week ?  ?PT DURATION: 4 weeks ?  ?PLANNED INTERVENTIONS: Therapeutic exercises, Therapeutic activity, Neuromuscular re-education, Balance training, Gait training, Patient/Family education, Stair training, Vestibular training, and DME instructions ?  ?PLAN FOR NEXT SESSION: check remainder of LTGs. Practice gait again with cane? Likely D/C.  Did she order 4 prong tip?  ? ? ? ? ?Arliss Journey, PT, DPT ?01/23/2022, 2:44 PM ? ?   ?

## 2022-01-26 ENCOUNTER — Ambulatory Visit: Payer: Medicare (Managed Care)

## 2022-01-26 ENCOUNTER — Ambulatory Visit: Payer: Medicare (Managed Care) | Admitting: Physical Therapy

## 2022-01-26 DIAGNOSIS — R2689 Other abnormalities of gait and mobility: Secondary | ICD-10-CM

## 2022-01-26 DIAGNOSIS — R1313 Dysphagia, pharyngeal phase: Secondary | ICD-10-CM

## 2022-01-26 DIAGNOSIS — R2681 Unsteadiness on feet: Secondary | ICD-10-CM

## 2022-01-26 DIAGNOSIS — M6281 Muscle weakness (generalized): Secondary | ICD-10-CM

## 2022-01-26 NOTE — Therapy (Signed)
?OUTPATIENT PHYSICAL THERAPY TREATMENT NOTE/DISCHARGE SUMMARY ? ? ?Patient Name: Cindy [name] ?MRN: [id number] ?DOB:October 14, 1932, 88 y.o., female ?Today's Date: 01/26/2022 ? ?PCP: Lawerance Cruel, MD ?REFERRING PROVIDER: Lawerance Cruel, MD ? ? PT End of Session - 01/26/22 1303   ? ? Visit Number 9   ? Number of Visits 9   ? Date for PT Re-Evaluation 01/28/22   ? Authorization Type [employer]   ? PT Start Time 1301   ? PT Stop Time 2671   full time not used due to D/C  ? PT Time Calculation (min) 38 min   ? Equipment Utilized During Treatment Gait belt   ? Activity Tolerance Patient tolerated treatment well;Patient limited by fatigue   ? Behavior During Therapy [facility] for tasks assessed/performed   ? ?  ?  ? ?  ? ? ? ?Past Medical History:  ?Diagnosis Date  ? Asthma   ? Bronchitis   ? COPD (chronic obstructive pulmonary disease) ([place])   ? Coronary artery disease   ? MI age 14  ? GERD (gastroesophageal reflux disease)   ? Heart attack ([place])   ? Hypercholesteremia   ? Hypertension   ? Iron deficiency anemia   ? used to see hem in [place], [place].   ? Renal insufficiency   ? Thyroid disease   ? ?Past Surgical History:  ?Procedure Laterality Date  ? APPENDECTOMY    ? BREAST BIOPSY Right   ? benign  ? CORONARY ANGIOPLASTY WITH STENT PLACEMENT    ? CORONARY ARTERY BYPASS GRAFT  09/1979  ? LEFT HEART CATH AND CORS/GRAFTS ANGIOGRAPHY N/A 11/27/2018  ? Procedure: LEFT HEART CATH AND CORS/GRAFTS ANGIOGRAPHY;  Surgeon: Lorretta Harp, MD;  Location: [facility];  Service: Cardiovascular;  Laterality: N/A;  ? Moh's Surgeries    ? for squamous cell carcinoma  ? PARTIAL HYSTERECTOMY  10/1978  ? ?Patient Active Problem List  ? Diagnosis Date Noted  ? Septic encephalopathy 12/17/2021  ? Acute respiratory failure with hypoxia ([place]) 12/13/2021  ? Leukocytosis 12/13/2021  ? Acute respiratory failure with hypoxia secondary to sepsis due to pneumonia ([place]) 12/12/2021  ? Essential (primary) hypertension 12/25/2018  ? Carotid artery  disease ([place]) 12/25/2018  ? Chest pain of uncertain etiology   ? Unstable angina ([place]) 11/26/2018  ? Sepsis ([place]) 01/06/2017  ? Thrombocytopenia ([place]) 01/06/2017  ? Lower urinary tract infectious disease 01/06/2017  ? Hypothyroidism 01/06/2017  ? Anemia 01/06/2017  ? Umbilical hernia 24/58/0998  ? Coronary atherosclerosis of native coronary artery 12/11/2013  ? Old MI (myocardial infarction) 12/11/2013  ? History of tobacco use 12/11/2013  ? Angina decubitus ([place]) 12/11/2013  ? Hyperlipidemia 12/11/2013  ? Rhinitis 09/16/2012  ? ILD (interstitial lung disease) ([place]) 05/08/2012  ? Pulmonary nodule, right 05/06/2012  ? Cough 03/20/2012  ? Iron deficiency anemia   ? Chronic kidney disease, stage 3b ([place])   ? ? ?REFERRING DIAG: Other abnormalities of gait and mobility (R26.89);Muscle weakness (generalized) (M62.81)  ? ?THERAPY DIAG:  ?Muscle weakness (generalized) ? ?Other abnormalities of gait and mobility ? ?Unsteadiness on feet ? ?PERTINENT HISTORY: Asthma, bronchitis, COPD, CAD (MI at age 28), HTN, renal insufficiency, and thyroid disease. ? ?PRECAUTIONS: Fall ? ?SUBJECTIVE: Got the quad tip for her cane and has been using it around the house and it is going well. Not feeling good with the rain and her arthritis.  ? ?PAIN:  ?Are you having pain? Yes: NPRS scale: 10/10 ?Pain location: neck and whole back ?Pain  description: achy and sore ?Aggravating factors: due to the rainy weather ?Relieving factors: heat helps neck temporarily, tramadol helps back mildly ? ? ?Today's Treatment: ? ? ? [facility] PT Assessment - 01/26/22 1306   ? ?  ? Functional Gait  Assessment  ? Gait assessed  Yes   ? Gait Level Surface Walks 20 ft, slow speed, abnormal gait pattern, evidence for imbalance or deviates 10-15 in outside of the 12 in walkway width. Requires more than 7 sec to ambulate 20 ft.   9.34  ? Change in Gait Speed Able to change speed, demonstrates mild gait deviations, deviates 6-10 in outside of the 12 in walkway width, or no  gait deviations, unable to achieve a major change in velocity, or uses a change in velocity, or uses an assistive device.   ? Gait with Horizontal Head Turns Performs head turns smoothly with slight change in gait velocity (eg, minor disruption to smooth gait path), deviates 6-10 in outside 12 in walkway width, or uses an assistive device.   ? Gait with Vertical Head Turns Performs task with slight change in gait velocity (eg, minor disruption to smooth gait path), deviates 6 - 10 in outside 12 in walkway width or uses assistive device   ? Gait and Pivot Turn Pivot turns safely within 3 sec and stops quickly with no loss of balance.   ? Step Over Obstacle Is able to step over one shoe box (4.5 in total height) without changing gait speed. No evidence of imbalance.   ? Gait with Narrow Base of Support Ambulates less than 4 steps heel to toe or cannot perform without assistance.   ? Gait with Eyes Closed Walks 20 ft, slow speed, abnormal gait pattern, evidence for imbalance, deviates 10-15 in outside 12 in walkway width. Requires more than 9 sec to ambulate 20 ft.   ? Ambulating Backwards Walks 20 ft, slow speed, abnormal gait pattern, evidence for imbalance, deviates 10-15 in outside 12 in walkway width.   24 sec  ? Steps Alternating feet, must use rail.   ? Total Score 16   ? FGA comment: High fall risk   ? ?  ?  ? ?  ? ? ? Access Code: [email or web address] ?URL: [email or web address] ?Date: 01/26/2022 ?Prepared by: Janann August ? ?Exercises ?- Corner Balance Feet Together With Eyes Closed  - 1 x daily - 5 x weekly - 1 sets - 2 reps ?- Corner Balance Feet Together: Eyes Open With Head Turns  - 1 x daily - 5 x weekly - 1 sets - 2 reps ?- Sit to Stand  - 1 x daily - 5 x weekly - 2 sets - 5 reps ?- Semi-Tandem Corner Balance With Eyes Open  - 1 x daily - 5 x weekly - 2 sets - 1 reps ?- Semi-Tandem Corner Balance With Eyes Closed  - 1 x daily - 5 x weekly - 3 sets - 2 reps ?- Backward Walking with Counter Support   - 1 x daily - 5 x weekly - 3 sets ?- Standing Marching  - 1 x daily - 5 x weekly - 3 sets ? ?PATIENT EDUCATION: ?Education details:  Results of FGA, fall prevention handout, final HEP for strength/balance and importance of continuing to perform after therapy.  ?Person educated: Patient & Spouse ?Education method: Explanation ?Education comprehension: verbalized understanding ?  ?  ?HOME EXERCISE PROGRAM: ?[email or web address] ? ? ? ?PHYSICAL THERAPY DISCHARGE SUMMARY ? ?Visits from Start of Care: 9 ? ?Current functional  level related to goals / functional outcomes: ?See LTGs. ?  ?Remaining deficits: ?Impaired balance, gait abnormalities, decr strength, decr activity tolerance.  ?  ?Education / Equipment: ?HEP, education on where purchase quad tip for cane.  ? ?Patient agrees to discharge. Patient goals were not met. Patient is being discharged due to being pleased with the current functional level. Reaching maximum potential with OPPT at this time.  ? ?  ?ASSESSMENT: ?  ?CLINICAL IMPRESSION: ?Today's skilled session focused on assessing remainder of pt's LTGs. Pt met goal in regards to fall prevention. Pt partially met HEP goal - added retro gait and marching at countertop for dynamic balance today. Pt improved FGA score to 16/30 (previously 14/30), but not to goal level, still putting pt at a high risk of falls. Pt has ordered a quad tip for her [facility] and reports it has been going well at home. Pt will be D/Ced at this time due to reaching maximum potential with OPPT at this time and being pleased with current functional level. Pt in agreement with plan.  ? ?  ?  ?OBJECTIVE IMPAIRMENTS Abnormal gait, decreased activity tolerance, decreased balance, decreased endurance, decreased strength, postural dysfunction, and pain.  ?  ?ACTIVITY LIMITATIONS meal prep and medication management.  ?  ?PERSONAL FACTORS Behavior pattern, Past/current experiences, and 3+ comorbidities:  asthma, bronchitis, COPD, CAD, HTN, renal insufficiency, and  thyroid disease.  are also affecting patient's functional outcome.  ?  ?  ?REHAB POTENTIAL: Good ?  ?CLINICAL DECISION MAKING: Stable/uncomplicated ?  ?EVALUATION COMPLEXITY: Low ?  ?  ?GOALS: ?Goals

## 2022-01-26 NOTE — Patient Instructions (Addendum)
Fall Prevention in the Home, Adult ?Falls can cause injuries and can happen to people of all ages. There are many things you can do to make your home safe and to help prevent falls. Ask for help when making these changes. ?What actions can I take to prevent falls? ?General Instructions ?Use good lighting in all rooms. Replace any light bulbs that burn out. ?Turn on the lights in dark areas. Use night-lights. ?Keep items that you use often in easy-to-reach places. Lower the shelves around your home if needed. ?Set up your furniture so you have a clear path. Avoid moving your furniture around. ?Do not have throw rugs or other things on the floor that can make you trip. ?Avoid walking on wet floors. ?If any of your floors are uneven, fix them. ?Add color or contrast paint or tape to clearly mark and help you see: ?Grab bars or handrails. ?First and last steps of staircases. ?Where the edge of each step is. ?If you use a stepladder: ?Make sure that it is fully opened. Do not climb a closed stepladder. ?Make sure the sides of the stepladder are locked in place. ?Ask someone to hold the stepladder while you use it. ?Know where your pets are when moving through your home. ?What can I do in the bathroom? ?  ?Keep the floor dry. Clean up any water on the floor right away. ?Remove soap buildup in the tub or shower. ?Use nonskid mats or decals on the floor of the tub or shower. ?Attach bath mats securely with double-sided, nonslip rug tape. ?If you need to sit down in the shower, use a plastic, nonslip stool. ?Install grab bars by the toilet and in the tub and shower. Do not use towel bars as grab bars. ?What can I do in the bedroom? ?Make sure that you have a light by your bed that is easy to reach. ?Do not use any sheets or blankets for your bed that hang to the floor. ?Have a firm chair with side arms that you can use for support when you get dressed. ?What can I do in the kitchen? ?Clean up any spills right away. ?If you  need to reach something above you, use a step stool with a grab bar. ?Keep electrical cords out of the way. ?Do not use floor polish or wax that makes floors slippery. ?What can I do with my stairs? ?Do not leave any items on the stairs. ?Make sure that you have a light switch at the top and the bottom of the stairs. ?Make sure that there are handrails on both sides of the stairs. Fix handrails that are broken or loose. ?Install nonslip stair treads on all your stairs. ?Avoid having throw rugs at the top or bottom of the stairs. ?Choose a carpet that does not hide the edge of the steps on the stairs. ?Check carpeting to make sure that it is firmly attached to the stairs. Fix carpet that is loose or worn. ?What can I do on the outside of my home? ?Use bright outdoor lighting. ?Fix the edges of walkways and driveways and fix any cracks. ?Remove anything that might make you trip as you walk through a door, such as a raised step or threshold. ?Trim any bushes or trees on paths to your home. ?Check to see if handrails are loose or broken and that both sides of all steps have handrails. ?Install guardrails along the edges of any raised decks and porches. ?Clear paths of anything that can  make you trip, such as tools or rocks. ?Have leaves, snow, or ice cleared regularly. ?Use sand or salt on paths during winter. ?Clean up any spills in your garage right away. This includes grease or oil spills. ?What other actions can I take? ?Wear shoes that: ?Have a low heel. Do not wear high heels. ?Have rubber bottoms. ?Feel good on your feet and fit well. ?Are closed at the toe. Do not wear open-toe sandals. ?Use tools that help you move around if needed. These include: ?Canes. ?Walkers. ?Scooters. ?Crutches. ?Review your medicines with your doctor. Some medicines can make you feel dizzy. This can increase your chance of falling. ?Ask your doctor what else you can do to help prevent falls. ?Where to find more information ?Centers for  Disease Control and Prevention, STEADI: http://www.wolf.info/ ?[employer] on Aging: http://kim-miller.com/ ?Contact a doctor if: ?You are afraid of falling at home. ?You feel weak, drowsy, or dizzy at home. ?You fall at home. ?Summary ?There are many simple things that you can do to make your home safe and to help prevent falls. ?Ways to make your home safe include removing things that can make you trip and installing grab bars in the bathroom. ?Ask for help when making these changes in your home. ?This information is not intended to replace advice given to you by your health care provider. Make sure you discuss any questions you have with your health care provider. ?Document Revised: 05/11/2020 Document Reviewed: 05/11/2020 ?Elsevier Patient Education ? 2022 [place]. ? ? ? ?Access Code: [email or web address] ?URL: [email or web address] ?Date: 01/26/2022 ?Prepared by: Janann August ? ?Exercises ?- Corner Balance Feet Together With Eyes Closed  - 1 x daily - 5 x weekly - 1 sets - 2 reps ?- Corner Balance Feet Together: Eyes Open With Head Turns  - 1 x daily - 5 x weekly - 1 sets - 2 reps ?- Sit to Stand  - 1 x daily - 5 x weekly - 2 sets - 5 reps ?- Semi-Tandem Corner Balance With Eyes Open  - 1 x daily - 5 x weekly - 2 sets - 1 reps ?- Semi-Tandem Corner Balance With Eyes Closed  - 1 x daily - 5 x weekly - 3 sets - 2 reps ?- Backward Walking with Counter Support  - 1 x daily - 5 x weekly - 3 sets ?- Standing Marching  - 1 x daily - 5 x weekly - 3 sets ?

## 2022-01-26 NOTE — Therapy (Signed)
?OUTPATIENT SPEECH LANGUAGE PATHOLOGY TREATMENT NOTE (DISCHARGE) ? ? ?Patient Name: Cindy [name] ?MRN: [id number] ?DOB:05-17-1932, 88 y.o., female ?Today's Date: 01/26/2022 ? ?PCP: Lawerance Cruel, MD ?REFERRING PROVIDER: Lawerance Cruel, MD ? ? End of Session - 01/26/22 1229   ? ? Visit Number 9   ? Number of Visits 13   ? Date for SLP Re-Evaluation 02/09/22   ? Authorization Type [employer] Medicare   ? SLP Start Time 1230   ? SLP Stop Time  1300   ? SLP Time Calculation (min) 30 min   ? Activity Tolerance Patient tolerated treatment well   ? ?  ?  ? ?  ? ? ? ? ? ? ?Past Medical History:  ?Diagnosis Date  ? Asthma   ? Bronchitis   ? COPD (chronic obstructive pulmonary disease) ([place])   ? Coronary artery disease   ? MI age 42  ? GERD (gastroesophageal reflux disease)   ? Heart attack ([place])   ? Hypercholesteremia   ? Hypertension   ? Iron deficiency anemia   ? used to see hem in [place], [place].   ? Renal insufficiency   ? Thyroid disease   ? ?Past Surgical History:  ?Procedure Laterality Date  ? APPENDECTOMY    ? BREAST BIOPSY Right   ? benign  ? CORONARY ANGIOPLASTY WITH STENT PLACEMENT    ? CORONARY ARTERY BYPASS GRAFT  09/1979  ? LEFT HEART CATH AND CORS/GRAFTS ANGIOGRAPHY N/A 11/27/2018  ? Procedure: LEFT HEART CATH AND CORS/GRAFTS ANGIOGRAPHY;  Surgeon: Lorretta Harp, MD;  Location: [name];  Service: Cardiovascular;  Laterality: N/A;  ? Moh's Surgeries    ? for squamous cell carcinoma  ? PARTIAL HYSTERECTOMY  10/1978  ? ?Patient Active Problem List  ? Diagnosis Date Noted  ? Septic encephalopathy 12/17/2021  ? Acute respiratory failure with hypoxia ([place]) 12/13/2021  ? Leukocytosis 12/13/2021  ? Acute respiratory failure with hypoxia secondary to sepsis due to pneumonia ([place]) 12/12/2021  ? Essential (primary) hypertension 12/25/2018  ? Carotid artery disease ([place]) 12/25/2018  ? Chest pain of uncertain etiology   ? Unstable angina ([place]) 11/26/2018  ? Sepsis ([place]) 01/06/2017  ? Thrombocytopenia ([place])  01/06/2017  ? Lower urinary tract infectious disease 01/06/2017  ? Hypothyroidism 01/06/2017  ? Anemia 01/06/2017  ? Umbilical hernia 27/51/7001  ? Coronary atherosclerosis of native coronary artery 12/11/2013  ? Old MI (myocardial infarction) 12/11/2013  ? History of tobacco use 12/11/2013  ? Angina decubitus ([place]) 12/11/2013  ? Hyperlipidemia 12/11/2013  ? Rhinitis 09/16/2012  ? ILD (interstitial lung disease) ([place]) 05/08/2012  ? Pulmonary nodule, right 05/06/2012  ? Cough 03/20/2012  ? Iron deficiency anemia   ? Chronic kidney disease, stage 3b ([place])   ? ? ?SPEECH THERAPY DISCHARGE SUMMARY ? ?Visits from Start of Care: 9 ? ?Current functional level related to goals / functional outcomes: ?Pt presents with improvements in dysphagia with improved tolerance of thin liquids via small, single cup sips exhibited. Subjective improvements in swallow function reported since initiation of ST services (much less frequent coughing at home). Recent CXR clear with no presence of RLL infiltrates indicated. Discussed repeat ST referral if swallow function declines (increased coughing; s/sx of aspiration). Pt is pleased with current progress and agreeable to ST discharge this date.  ?  ?Remaining deficits: ?NA  ? ?Education / Equipment: ?EMST, swallow exercises, swallow precautions, s/sx of aspiration PNA ? ?Patient agrees to discharge. Patient goals were met. Patient is being discharged due to  meeting the stated rehab goals. ? ? ? ?ONSET DATE: 12-12-21 ? ?REFERRING DIAG: R13.10 (ICD-10-CM) - Dysphagia, unspecified  ? ?THERAPY DIAG: Dysphagia, pharyngeal phase ? ?SUBJECTIVE: "I'm hurting all over" ? ?PAIN:  ?Are you having pain? Yes ?NPRS scale: 10/10 ?Pain location: neck/back/hands ? ?OBJECTIVE:  ? ?DIAGNOSTIC FINDINGS: (MBSS): "Pt has a mild pharyngeal dysphagia, characterized primarily by mistimed swallowing and mildly reduced epiglottic inversion. Trace penetration as deep as to the vocal folds and intermittent aspiration  occurs with thin liquids. She cannot clear aspirates with reflexive or volitional coughing despite small volume. Aspiration primarily occurs when using a straw and is not eliminated with use of a chin tuck. Pt is only able to tuck her chin a little bit due to reports of neck pain, but even subtle movement that she can tolerate without pain is adequate to increase airway protection when also cued for small, single cup sips. Although penetration still occurs, it is more shallow and mostly clears upon completion of the swallow. Recommend regular solids and thin liquids by cup via small, single sips and use of a chin tuck as tolerated." ?  ? SLP Diet Recommendations: Regular solids;Thin liquid ? Liquid Administration via: Cup;No straw ? Medication Administration: Whole meds with puree ? Supervision: Patient able to self feed;Intermittent supervision to cue for compensatory strategies ? Compensations: Slow rate;Small sips/bites;Clear throat intermittently;Chin tuck;Other (Comment) (chew food well; one sip at a time) ? Postural Changes: Seated upright at 90 degrees ? Oral Care Recommendations: Oral care BID ? ?CXR (01-19-22)  Low lung volumes with mild bibasilar atelectasis and or scarring again noted. Previously noted right base infiltrate no longer identified. ?  ?  ?RECOMMENDATIONS FROM OBJECTIVE SWALLOW STUDY (MBSS/FEES):   ?Objective swallow impairments: mistimed swallow initiation and mildly reduced epiglottic inversion  ?Objective recommended compensations: No straw, Chin Tuck, Intermittent throat clears ?  ?  ?TODAY'S TREATMENT:  ?01-26-22: SLP completed education and training of recommended compensatory swallow precautions (small, single sips), recommended swallow exercises to target swallow deficits (continue EMST), and s/sx of aspiration PNA for future monitoring. Pt and husband verbalized understanding and agreement with SLP recommendations. Re-assessed MEP today with 55 cm H2O achieved, with notable improvement  from first session (initial MEP=42). EMST mildly adjusted. No overt s/sx of aspiration exhibited today with thin liquids today; some baseline coughing exhibited prior to initiation of PO trials. Pt is pleased with current progress and agreeable to ST discharge given subjective improvements in swallow function. Denied further questions.  ? ?01-23-22: Recent CXR (see above) indicated right base infiltrate no longer identified. No subjective swallow dysfunction reported since last ST visit. Pt continues to complete HEP albeit somewhat inconsistent (only completed Saturday over weekend due to busy schedule). SLP recommending continuing EMST s/p ST discharge to maximize cough production. Pt completed EMST x25 with rare min A. SLP educated 3 pillars of aspiration PNA and s/sx of aspiration PNA to monitor, with handout provided. Pt exhibited rare coughs following sips of water, with perceived improved cough strength compared to initial baseline.  ? ?01-19-22: Pt able to teach back with mod A, for details, current HEP. Demonstrates mod I. Reports to typically doing exercises 1x a day. Is doing hard swallow- ~25, [place] ~12-15, EMST 5x5. Pt reports perceived effort level 8/10. Tells ST the exercises "are so, so hard. They hurt." Pt is getting a chest x-ray this afternoon. ST leads pt in performing 6 masakos and 10 effortful swallows this session. Pt consumed thin liquids via single cup sips with  no overt s/sx of aspiration today.  ? ?01-16-22: Family charted BP numbers at home (somewhat delayed ~1 hr after RMST). Will plan to discuss BP with PCP at appointment this afternoon. Conducted ongoing education and training for EMST and swallow exercises. SLP re-assessed EMST setting for possible resistance advancement, in which current setting deemed adequate based on clinical observations and patient reports still challenging. Pt completed 25 total reps (5 sets of 5 reps) with rare min A for effort and accuracy. BP remained consistent  before/after RMST. Pt consumed thin liquids via single cup sips with no overt s/sx of aspiration today.  ? ?01-12-22: Pt brought in EMST device and exercise tracker for swallowing exercises. Deferred EMST toda

## 2022-02-07 DIAGNOSIS — L821 Other seborrheic keratosis: Secondary | ICD-10-CM | POA: Diagnosis not present

## 2022-02-07 DIAGNOSIS — L570 Actinic keratosis: Secondary | ICD-10-CM | POA: Diagnosis not present

## 2022-02-07 DIAGNOSIS — L578 Other skin changes due to chronic exposure to nonionizing radiation: Secondary | ICD-10-CM | POA: Diagnosis not present

## 2022-05-04 ENCOUNTER — Ambulatory Visit
Admission: RE | Admit: 2022-05-04 | Discharge: 2022-05-04 | Disposition: A | Discharge status: Home / Self Care | Payer: Medicare (Managed Care) | Source: Ambulatory Visit | Attending: Family Medicine | Admitting: Family Medicine

## 2022-05-04 DIAGNOSIS — E2839 Other primary ovarian failure: Secondary | ICD-10-CM

## 2022-05-04 DIAGNOSIS — Z780 Asymptomatic menopausal state: Secondary | ICD-10-CM | POA: Diagnosis not present

## 2022-05-04 DIAGNOSIS — M8589 Other specified disorders of bone density and structure, multiple sites: Secondary | ICD-10-CM | POA: Diagnosis not present

## 2022-05-08 DIAGNOSIS — M8580 Other specified disorders of bone density and structure, unspecified site: Secondary | ICD-10-CM | POA: Diagnosis not present

## 2022-05-08 DIAGNOSIS — M549 Dorsalgia, unspecified: Secondary | ICD-10-CM | POA: Diagnosis not present

## 2022-05-08 DIAGNOSIS — R350 Frequency of micturition: Secondary | ICD-10-CM | POA: Diagnosis not present

## 2022-05-08 DIAGNOSIS — M1990 Unspecified osteoarthritis, unspecified site: Secondary | ICD-10-CM | POA: Diagnosis not present

## 2022-05-08 DIAGNOSIS — R413 Other amnesia: Secondary | ICD-10-CM | POA: Diagnosis not present

## 2022-05-08 DIAGNOSIS — N390 Urinary tract infection, site not specified: Secondary | ICD-10-CM | POA: Diagnosis not present

## 2022-05-14 ENCOUNTER — Emergency Department (HOSPITAL_BASED_OUTPATIENT_CLINIC_OR_DEPARTMENT_OTHER)
Admission: EM | Admit: 2022-05-14 | Discharge: 2022-05-14 | Disposition: A | Discharge status: Home / Self Care | Payer: Medicare (Managed Care) | Attending: Emergency Medicine | Admitting: Emergency Medicine

## 2022-05-14 ENCOUNTER — Encounter (HOSPITAL_BASED_OUTPATIENT_CLINIC_OR_DEPARTMENT_OTHER): Payer: Self-pay | Admitting: Emergency Medicine

## 2022-05-14 ENCOUNTER — Emergency Department (HOSPITAL_BASED_OUTPATIENT_CLINIC_OR_DEPARTMENT_OTHER): Payer: Medicare (Managed Care)

## 2022-05-14 ENCOUNTER — Other Ambulatory Visit: Payer: Self-pay

## 2022-05-14 DIAGNOSIS — Z7951 Long term (current) use of inhaled steroids: Secondary | ICD-10-CM | POA: Insufficient documentation

## 2022-05-14 DIAGNOSIS — I10 Essential (primary) hypertension: Secondary | ICD-10-CM | POA: Insufficient documentation

## 2022-05-14 DIAGNOSIS — I2510 Atherosclerotic heart disease of native coronary artery without angina pectoris: Secondary | ICD-10-CM | POA: Insufficient documentation

## 2022-05-14 DIAGNOSIS — J449 Chronic obstructive pulmonary disease, unspecified: Secondary | ICD-10-CM | POA: Diagnosis not present

## 2022-05-14 DIAGNOSIS — J45909 Unspecified asthma, uncomplicated: Secondary | ICD-10-CM | POA: Diagnosis not present

## 2022-05-14 DIAGNOSIS — N200 Calculus of kidney: Secondary | ICD-10-CM | POA: Diagnosis not present

## 2022-05-14 DIAGNOSIS — Z7982 Long term (current) use of aspirin: Secondary | ICD-10-CM | POA: Diagnosis not present

## 2022-05-14 DIAGNOSIS — Z79899 Other long term (current) drug therapy: Secondary | ICD-10-CM | POA: Diagnosis not present

## 2022-05-14 DIAGNOSIS — R310 Gross hematuria: Secondary | ICD-10-CM

## 2022-05-14 DIAGNOSIS — N939 Abnormal uterine and vaginal bleeding, unspecified: Secondary | ICD-10-CM | POA: Diagnosis not present

## 2022-05-14 LAB — URINALYSIS, ROUTINE W REFLEX MICROSCOPIC
Bilirubin Urine: NEGATIVE
Glucose, UA: NEGATIVE mg/dL
Ketones, ur: NEGATIVE mg/dL
Nitrite: NEGATIVE
Protein, ur: NEGATIVE mg/dL
RBC / HPF: >50 RBC/hpf — ABNORMAL HIGH (ref 0–5)
Specific Gravity, Urine: 1.008 (ref 1.005–1.030)
pH: 6 (ref 5.0–8.0)

## 2022-05-14 LAB — CBC WITH DIFFERENTIAL/PLATELET
Abs Immature Granulocytes: 0.02 10*3/uL (ref 0.00–0.07)
Basophils Absolute: 0 10*3/uL (ref 0.0–0.1)
Basophils Relative: 1 %
Eosinophils Absolute: 0.6 10*3/uL — ABNORMAL HIGH (ref 0.0–0.5)
Eosinophils Relative: 9 %
HCT: 31.7 % — ABNORMAL LOW (ref 36.0–46.0)
Hemoglobin: 10.6 g/dL — ABNORMAL LOW (ref 12.0–15.0)
Immature Granulocytes: 0 %
Lymphocytes Relative: 31 %
Lymphs Abs: 1.8 10*3/uL (ref 0.7–4.0)
MCH: 33.1 pg (ref 26.0–34.0)
MCHC: 33.4 g/dL (ref 30.0–36.0)
MCV: 99.1 fL (ref 80.0–100.0)
Monocytes Absolute: 0.8 10*3/uL (ref 0.1–1.0)
Monocytes Relative: 13 %
Neutro Abs: 2.7 10*3/uL (ref 1.7–7.7)
Neutrophils Relative %: 46 %
Platelets: 126 10*3/uL — ABNORMAL LOW (ref 150–400)
RBC: 3.2 MIL/uL — ABNORMAL LOW (ref 3.87–5.11)
RDW: 12.7 % (ref 11.5–15.5)
WBC: 5.9 10*3/uL (ref 4.0–10.5)
nRBC: 0 % (ref 0.0–0.2)

## 2022-05-14 LAB — BASIC METABOLIC PANEL
Anion gap: 9 (ref 5–15)
BUN: 41 mg/dL — ABNORMAL HIGH (ref 8–23)
CO2: 25 mmol/L (ref 22–32)
Calcium: 10.3 mg/dL (ref 8.9–10.3)
Chloride: 106 mmol/L (ref 98–111)
Creatinine, Ser: 1.35 mg/dL — ABNORMAL HIGH (ref 0.44–1.00)
GFR, Estimated: 38 mL/min — ABNORMAL LOW (ref >60)
Glucose, Bld: 97 mg/dL (ref 70–99)
Potassium: 4.7 mmol/L (ref 3.5–5.1)
Sodium: 140 mmol/L (ref 135–145)

## 2022-05-14 MED ORDER — CEFTRIAXONE SODIUM 1 G IJ SOLR
1.0000 g | Freq: Once | INTRAMUSCULAR | Status: AC
  Administered 2022-05-14: 1 g via INTRAMUSCULAR
  Filled 2022-05-14: qty 10

## 2022-05-14 NOTE — Discharge Instructions (Signed)
You were seen today for bleeding.  It is not clear where you are bleeding from.  Given that you do not have a uterus, it would be very unlikely that you are bleeding from your vagina as I did not see any cuts or sores.  This is most likely gross hematuria.  Continue your antibiotic at home.  Urine culture has been sent.  If symptoms worsen, you should be reevaluated.

## 2022-05-14 NOTE — ED Provider Notes (Signed)
[facility] DEPT Provider Note   CSN: [id number] Arrival date & time: 05/14/22  0340     History {Add pertinent medical, surgical, social history, OB history to HPI:1} Chief Complaint  Patient presents with   Vaginal Bleeding    Cindy [name] is a 88 y.o. female.  HPI     This is an 88 year old female who presents with concerns for vaginal bleeding.  Patient reports that she woke up to go urinate around 330 this morning.  She noted a significant mount of bright red blood on the toilet tissue.  She believes it was coming from her vagina.  She reports intermittent lower abdominal and pelvic pain with vaginal pain over several months.  She saw her primary doctor, GYN, and gastroenterology.  She did have a CT scan which she reports was negative.  Patient reports that the OB told her she had vaginal dryness.  She has not been sexually active.  Denies any injury.  She is currently being treated for UTI with an unknown antibiotic.  Denies fevers or back pain.  She is not on any anticoagulants.  Home Medications Prior to Admission medications   Medication Sig Start Date End Date Taking? Authorizing Provider  acetaminophen (TYLENOL) 325 MG tablet Take 2 tablets (650 mg total) by mouth every 6 (six) hours as needed for mild pain (or Fever >/= 101). 12/15/21   Sheikh, Omair Latif, DO  albuterol (VENTOLIN HFA) 108 (90 Base) MCG/ACT inhaler Inhale 1 puff into the lungs every 4 (four) hours as needed for wheezing or shortness of breath. 11/17/21   [provider]  aspirin 81 MG tablet Take 81 mg by mouth daily.     [provider]  atorvastatin (LIPITOR) 80 MG tablet Take 1 tablet (80 mg total) by mouth daily. 12/25/18   Daune Perch, NP  benzonatate (TESSALON) 200 MG capsule Take 1 capsule by mouth twice daily as needed for cough Patient taking differently: Take 200 mg by mouth 2 (two) times daily. 09/23/20   Rigoberto Noel, MD  Calcium Carbonate-Vitamin D  600-200 MG-UNIT TABS Take 1 tablet by mouth 2 (two) times daily.    [provider]  ferrous sulfate 325 (65 FE) MG tablet Take 325 mg by mouth daily with breakfast.    [provider]  fexofenadine (ALLEGRA) 180 MG tablet Take 180 mg by mouth daily.    [provider]  ipratropium (ATROVENT) 0.06 % nasal spray Place 2 sprays into both nostrils 2 (two) times daily.    [provider]  isosorbide mononitrate (IMDUR) 30 MG 24 hr tablet Take 90 mg by mouth every morning. 11/13/21   [provider]  levothyroxine (SYNTHROID, LEVOTHROID) 175 MCG tablet Take 175 mcg by mouth every evening.    [provider]  losartan (COZAAR) 50 MG tablet Take 50 mg by mouth daily.    [provider]  metoprolol (TOPROL-XL) 50 MG 24 hr tablet Take 50 mg by mouth daily.      [provider]  montelukast (SINGULAIR) 10 MG tablet Take 10 mg by mouth daily. 08/31/20   [provider]  Multiple Vitamins-Minerals (PRESERVISION AREDS 2 PO) Take 1 capsule by mouth 2 (two) times daily.    [provider]  Niacinamide-Zn-Cu-Methfo-Se-Cr (NICOTINAMIDE PO) Take 1 capsule by mouth 2 (two) times daily.    [provider]  nitroGLYCERIN (NITROSTAT) 0.4 MG SL tablet PLACE 1 TABLET UNDER THE TONGUE EVERY 5 MINUTES AS NEEDED FOR CHEST PAIN  Patient taking differently: Place 0.4 mg under the tongue every 5 (five) minutes as needed for chest pain. 09/11/19   Jerline Pain, MD  ondansetron (ZOFRAN) 4 MG tablet Take 1 tablet (4 mg total) by mouth every 6 (six) hours as needed for nausea. 12/15/21   Raiford Noble Latif, DO  Polyethyl Glycol-Propyl Glycol (SYSTANE OP) Apply 1-2 drops to eye at bedtime as needed (dry eyes).    [provider]  spironolactone-hydrochlorothiazide (ALDACTAZIDE) 25-25 MG tablet Take 1 tablet by mouth daily.    [provider]  traMADol (ULTRAM) 50 MG tablet Take 50 mg by mouth 2 (two) times daily.     [provider]      Allergies    Codeine    Review of Systems   Review of Systems  Genitourinary:  Positive for vaginal bleeding and vaginal pain. Negative for dysuria.  All other systems reviewed and are negative.   Physical Exam Updated Vital Signs BP (!) 188/74 (BP Location: Left Arm)   Pulse 63   Temp 98 F (36.7 C) (Oral)   Resp 16   Ht 1.524 m (5')   Wt 66.2 kg   LMP  (LMP Unknown)   SpO2 97%   BMI 28.51 kg/m  Physical Exam Vitals and nursing note reviewed.  Constitutional:      Appearance: She is well-developed. She is not ill-appearing.  HENT:     Head: Normocephalic and atraumatic.  Eyes:     Pupils: Pupils are equal, round, and reactive to light.  Cardiovascular:     Rate and Rhythm: Normal rate and regular rhythm.  Pulmonary:     Effort: Pulmonary effort is normal. No respiratory distress.  Abdominal:     Palpations: Abdomen is soft.     Tenderness: There is no abdominal tenderness.  Genitourinary:    Comments: Dried blood noted externally, no active bleeding, patient did not tolerate full speculum exam, no active bleeding noted, no blood pooling noted in the vaginal introitus, unable to visualize cervical os Musculoskeletal:     Cervical back: Neck supple.  Skin:    General: Skin is warm and dry.  Neurological:     Mental Status: She is alert and oriented to person, place, and time.  Psychiatric:        Mood and Affect: Mood normal.     ED Results / Procedures / Treatments   Labs (all labs ordered are listed, but only abnormal results are displayed) Labs Reviewed  URINALYSIS, ROUTINE W REFLEX MICROSCOPIC - Abnormal; Notable for the following components:      Result Value   Hgb urine dipstick LARGE (*)    Leukocytes,Ua MODERATE (*)    RBC / HPF >50 (*)    Bacteria, UA RARE (*)    All other components within normal limits  CBC WITH DIFFERENTIAL/PLATELET  BASIC METABOLIC PANEL    EKG None  Radiology No results  found.  Procedures Procedures  {Document cardiac monitor, telemetry assessment procedure when appropriate:1}  Medications Ordered in ED Medications - No data to display  ED Course/ Medical Decision Making/ A&P                           Medical Decision Making Amount and/or Complexity of Data Reviewed Labs: ordered. Radiology: ordered.   ***  {Document critical care time when appropriate:1} {Document review of labs and clinical decision tools ie heart score, Chads2Vasc2 etc:1}  {Document your independent review of  radiology images, and any outside records:1} {Document your discussion with family members, caretakers, and with consultants:1} {Document social determinants of health affecting pt's care:1} {Document your decision making why or why not admission, treatments were needed:1} Final Clinical Impression(s) / ED Diagnoses Final diagnoses:  None    Rx / DC Orders ED Discharge Orders     None

## 2022-05-14 NOTE — ED Notes (Signed)
Call placed to lab, urine culture will be added to previous urine collection

## 2022-05-14 NOTE — ED Triage Notes (Signed)
  Patient comes in with vaginal bleeding that she noticed after using the bathroom around 0330.  Patient states she had large amounts of bright red blood when she wiped.  Patient has been having intermittent pain in vagina and was diagnosed with dryness by OB.  Patient able to ambulate with assistance.  No pain at this time.  No dizziness or lightheadedness.

## 2022-05-15 LAB — URINE CULTURE

## 2022-05-16 DIAGNOSIS — L570 Actinic keratosis: Secondary | ICD-10-CM | POA: Diagnosis not present

## 2022-05-29 IMAGING — DX DG CHEST 2V
2 series · 2 of 2 positions shown · non-contrast
Comparison: CT chest 12/12/2021.  Chest x-ray 12/12/2021.

CLINICAL DATA: Follow-up pneumonia.

EXAM:
CHEST - 2 VIEW

[dg chest 2 view (1 of 2)]
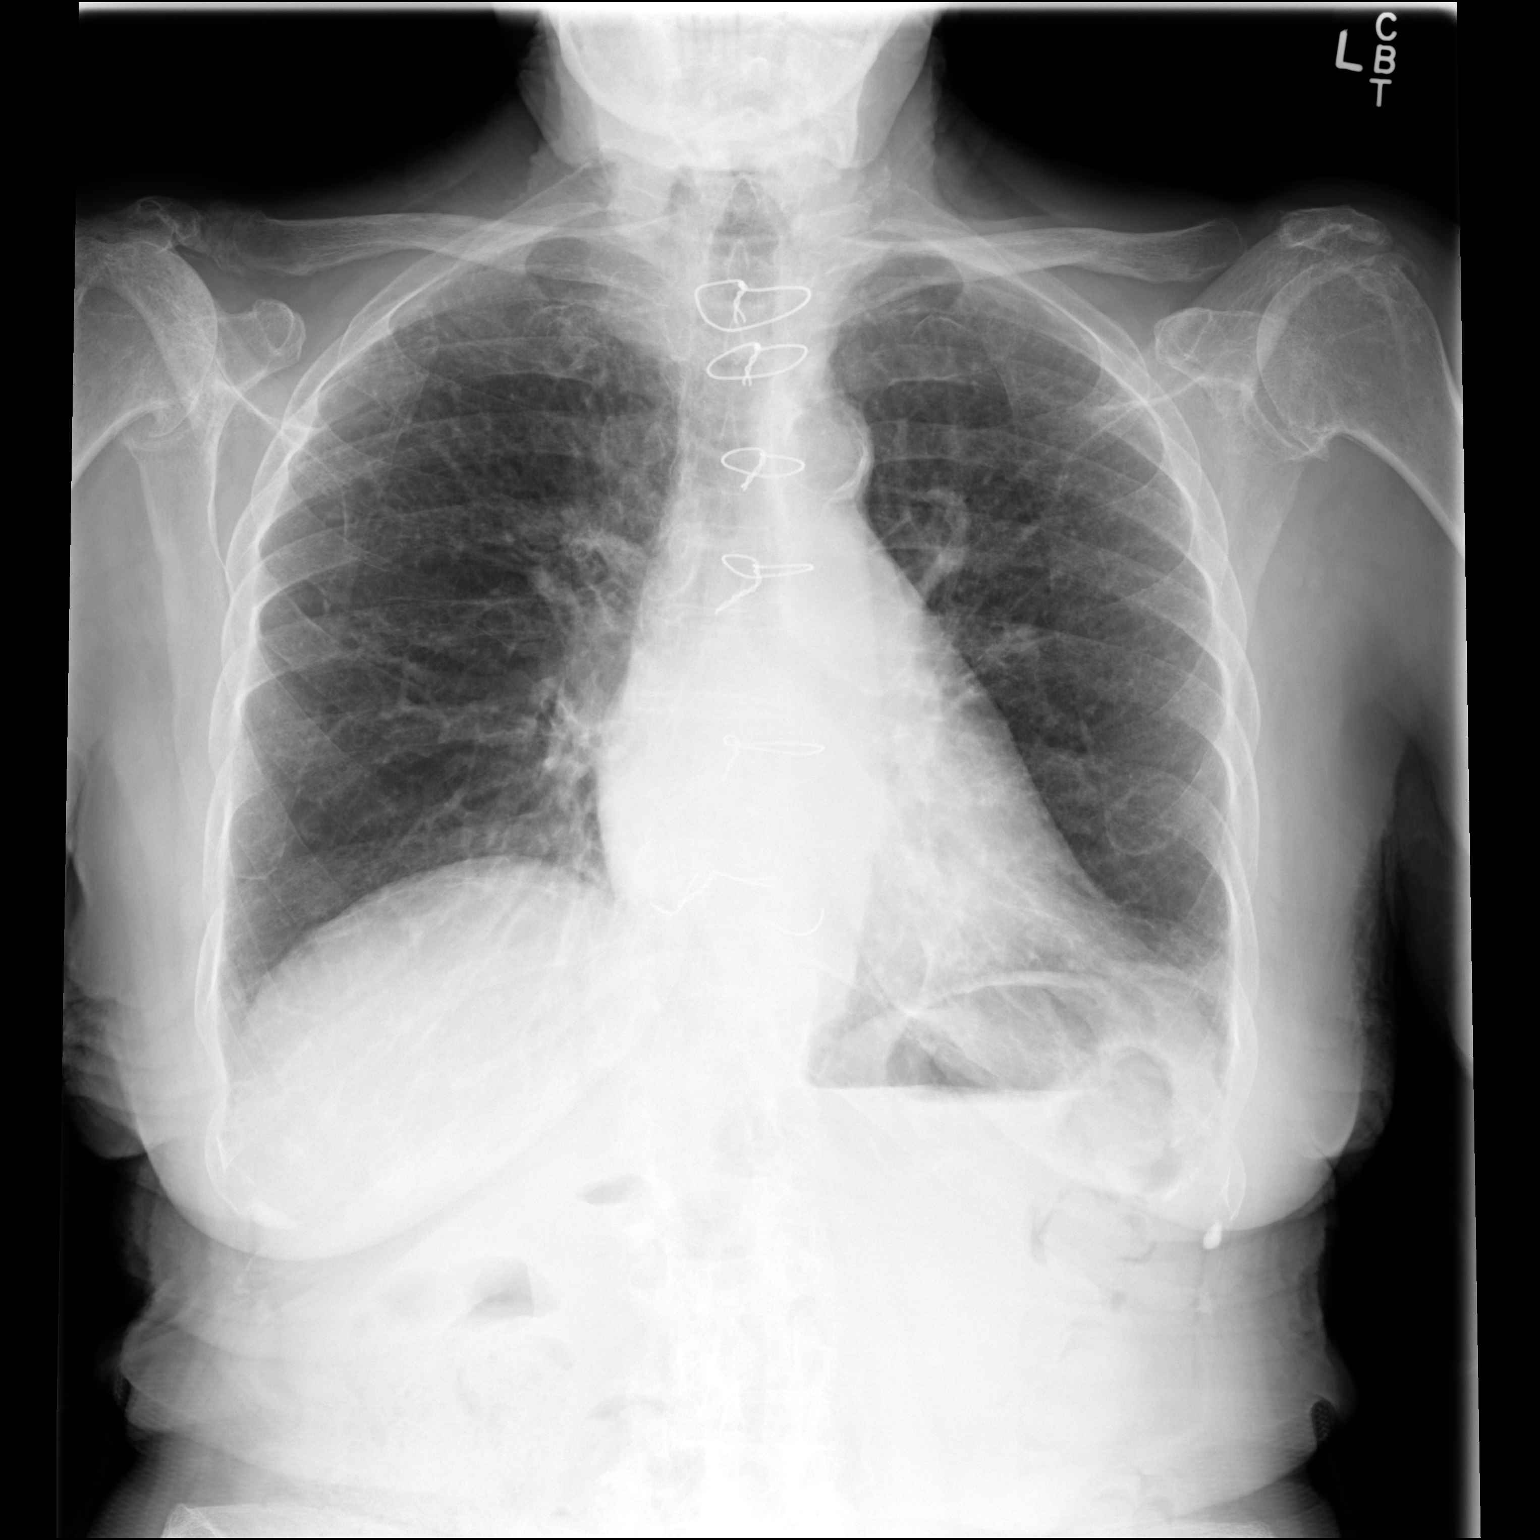

[dg chest 2 view (2 of 2)]
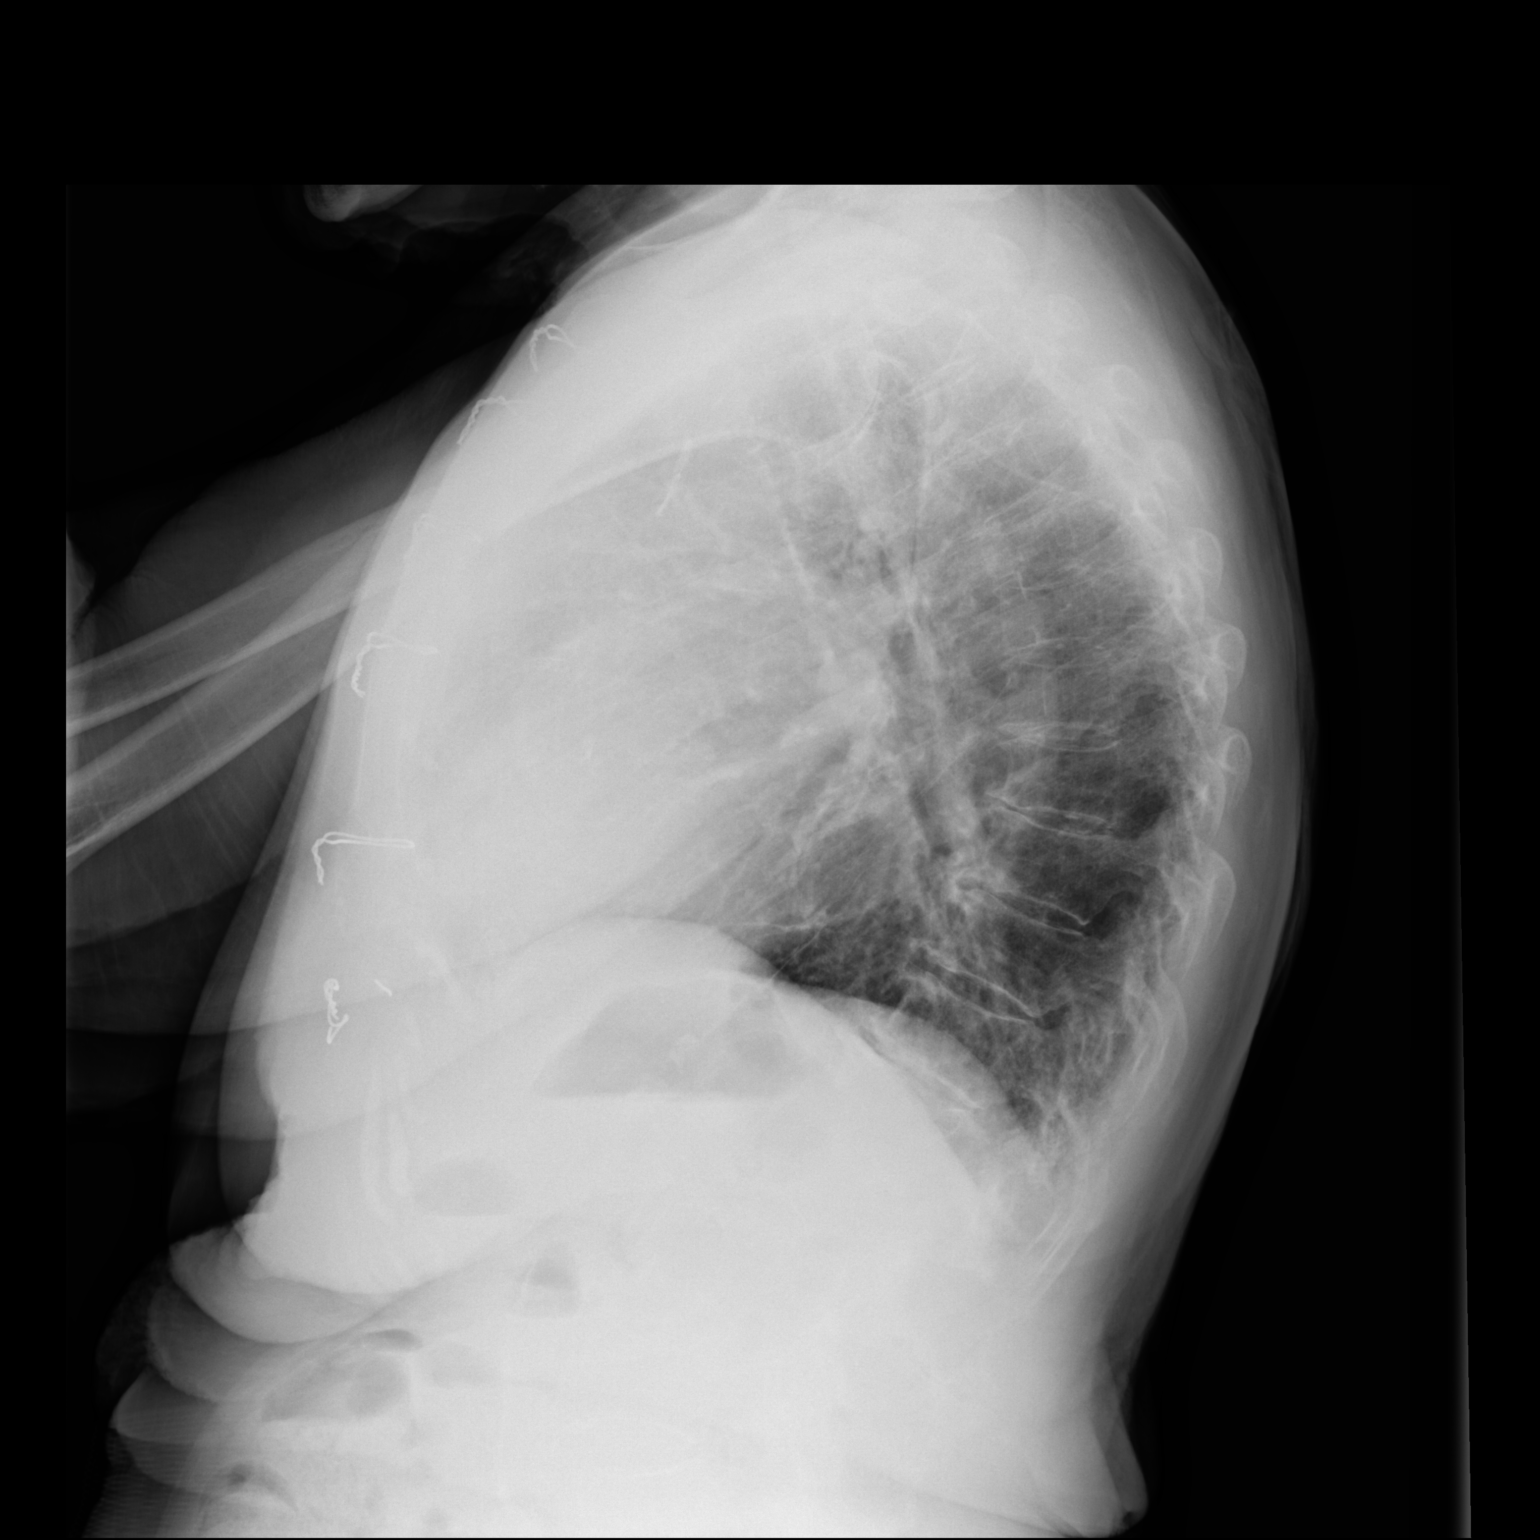

[2 of 2 positions shown; findings below may reference images not displayed]

FINDINGS: Prior median sternotomy fracture lower mediastinal wires again
noted. No interim change. Heart size normal. Low lung volumes with
mild bibasilar atelectasis and or scarring again noted. Previously
noted right base infiltrate no longer identified. No pleural
effusion or pneumothorax. Degenerative changes thoracic spine and
both shoulders. High-riding shoulders suggesting chronic bilateral
rotator cuff tears.
IMPRESSION: Low lung volumes with mild bibasilar atelectasis and or scarring
again noted. Previously noted right base infiltrate no longer
identified.

## 2022-06-08 DIAGNOSIS — Z87891 Personal history of nicotine dependence: Secondary | ICD-10-CM | POA: Diagnosis not present

## 2022-06-08 DIAGNOSIS — Z8619 Personal history of other infectious and parasitic diseases: Secondary | ICD-10-CM | POA: Diagnosis not present

## 2022-06-08 DIAGNOSIS — N200 Calculus of kidney: Secondary | ICD-10-CM | POA: Diagnosis not present

## 2022-06-08 DIAGNOSIS — R1084 Generalized abdominal pain: Secondary | ICD-10-CM | POA: Diagnosis not present

## 2022-06-08 DIAGNOSIS — R310 Gross hematuria: Secondary | ICD-10-CM | POA: Diagnosis not present

## 2022-06-08 DIAGNOSIS — Z87440 Personal history of urinary (tract) infections: Secondary | ICD-10-CM | POA: Diagnosis not present

## 2022-06-13 DIAGNOSIS — R310 Gross hematuria: Secondary | ICD-10-CM | POA: Diagnosis not present

## 2022-07-04 DIAGNOSIS — R319 Hematuria, unspecified: Secondary | ICD-10-CM | POA: Diagnosis not present

## 2022-07-04 DIAGNOSIS — N200 Calculus of kidney: Secondary | ICD-10-CM | POA: Diagnosis not present

## 2022-07-04 DIAGNOSIS — Z87440 Personal history of urinary (tract) infections: Secondary | ICD-10-CM | POA: Diagnosis not present

## 2022-07-19 ENCOUNTER — Other Ambulatory Visit: Payer: Self-pay | Admitting: Family Medicine

## 2022-07-19 DIAGNOSIS — Z1231 Encounter for screening mammogram for malignant neoplasm of breast: Secondary | ICD-10-CM

## 2022-07-31 ENCOUNTER — Encounter: Payer: Self-pay | Admitting: Pulmonary Disease

## 2022-07-31 ENCOUNTER — Ambulatory Visit (INDEPENDENT_AMBULATORY_CARE_PROVIDER_SITE_OTHER): Payer: Medicare (Managed Care) | Admitting: Pulmonary Disease

## 2022-07-31 VITALS — BP 118/72 | HR 73 | Temp 97.6°F | Ht 60.0 in | Wt 143.2 lb

## 2022-07-31 DIAGNOSIS — J849 Interstitial pulmonary disease, unspecified: Secondary | ICD-10-CM | POA: Diagnosis not present

## 2022-07-31 DIAGNOSIS — R053 Chronic cough: Secondary | ICD-10-CM

## 2022-07-31 DIAGNOSIS — Z23 Encounter for immunization: Secondary | ICD-10-CM

## 2022-07-31 LAB — BASIC METABOLIC PANEL
BUN: 29 mg/dL — ABNORMAL HIGH (ref 6–23)
CO2: 26 mEq/L (ref 19–32)
Calcium: 10.4 mg/dL (ref 8.4–10.5)
Chloride: 105 mEq/L (ref 96–112)
Creatinine, Ser: 1.52 mg/dL — ABNORMAL HIGH (ref 0.40–1.20)
GFR: 30.46 mL/min — ABNORMAL LOW (ref >60.00)
Glucose, Bld: 116 mg/dL — ABNORMAL HIGH (ref 70–99)
Potassium: 4.5 mEq/L (ref 3.5–5.1)
Sodium: 140 mEq/L (ref 135–145)

## 2022-07-31 MED ORDER — PREDNISONE 10 MG PO TABS
ORAL_TABLET | ORAL | 0 refills | Status: AC
Start: 2022-07-31 — End: 2022-08-14

## 2022-07-31 NOTE — Progress Notes (Signed)
   Subjective:    Patient ID: Cindy [name], female    DOB: 06-05-1932, 88 y.o.   MRN: [id number]  HPI  88 yo remote ex-smoker for FU of ILD/NSIP, chronic cough  & stable RLL pulmonary nodule. She presented in 2013 with persistent wheezing and cough   -likely related to ILD other possibilities include GERD and upper airway cough  PMH -CABG in 83, severe osteo-arthritis -Tessalon Perles have not helped. Tussionex caused her to be drowsy/ incoherent requiring ED visit .  She smoked 1.5 packs per day for 25 years before quitting in '81  Chief Complaint  Patient presents with  . Follow-up    Pt states she has been doing okay since last visit. States that she has been coughing a lot which is worse at night. Denies any complaints of wheezing. States she has tested for covid multiple times which has come back negative.      Significant tests/ events reviewed  09/2020 HRCT  chest >> probable UIP , unchanged from 2019  HRCT chest August 2019 that showed fibrotic interstitial lung disease without frank honeycombing and without progression since 2015 ? NSIP   HRCT 03/2012 ILD pattern is nonspecific, and could represent either early usual interstitial pneumonia (UIP), or nonspecific interstitial pneumonia (NSIP). 40m RLL nodule    CT chest 07/19/14 -mild subpleural fibroiss unchanged from 03/2012. Scattered pulmonary nodules are unchanged (considered benign with 2 yr serial follow up ) .    PFTs 03/2012 no airway obstruction, preserved lung volumes, isolated decrease in DLCO to 56% corrects for Va.   Spirometry 04/2013 - preserved lung function w/ no airflow obstruction.    Review of Systems neg for any significant sore throat, dysphagia, itching, sneezing, nasal congestion or excess/ purulent secretions, fever, chills, sweats, unintended wt loss, pleuritic or exertional cp, hempoptysis, orthopnea pnd or change in chronic leg swelling. Also denies presyncope, palpitations, heartburn, abdominal  pain, nausea, vomiting, diarrhea or change in bowel or urinary habits, dysuria,hematuria, rash, arthralgias, visual complaints, headache, numbness weakness or ataxia.     Objective:   Physical Exam   Gen. Pleasant, elderly,well-nourished, in no distress ENT - no thrush, no pallor/icterus,no post nasal drip Neck: No JVD, no thyromegaly, no carotid bruits Lungs: no use of accessory muscles, no dullness to percussion, Left basal rales no rhonchi  Cardiovascular: Rhythm regular, heart sounds  normal, no murmurs or gallops, no peripheral edema Musculoskeletal: No deformities, no cyanosis or clubbing         Assessment & Plan:

## 2022-07-31 NOTE — Patient Instructions (Addendum)
X flu shot  X HRCT chest  X Prednisone 10 mg tabs  Take 2 tabs daily with food x 7ds, then 1 tab daily with food x 7ds then STOP  Continue Delsym & tessalon perles for cough

## 2022-08-01 LAB — CK TOTAL AND CKMB (NOT AT ARMC)
CK, MB: 1.3 ng/mL (ref 0–5.0)
Relative Index: 2.2 (ref 0–4.0)
Total CK: 59 U/L (ref 29–143)

## 2022-08-01 NOTE — Assessment & Plan Note (Signed)
Acute worsening of her chronic cough could be related to GERD or upper airway cough syndrome or underlying ILD.  While we are assessing underlying ILD, we will give her a short course of prednisone Unfortunately given her advanced age and previous reaction to codeine cough syrup, will try to avoid narcotics here

## 2022-08-01 NOTE — Assessment & Plan Note (Signed)
Dates back to 2013 and has mostly been nonprogressive, attributed to NSIP versus scarring.  CT chest from February 2023 shows motion artifacts. We will pulmonary assess with high-resolution CT chest. Previous serology showed positive ANA 1-3 20 , she does not have any stigmata of collagen vascular disease.  We will reassess with repeat serology.

## 2022-08-02 LAB — ANA+ENA+DNA/DS+SCL 70+SJOSSA/B
ANA Titer 1: POSITIVE — AB
ENA RNP Ab: <0.2 AI (ref 0.0–0.9)
ENA SM Ab Ser-aCnc: <0.2 AI (ref 0.0–0.9)
ENA SSA (RO) Ab: <0.2 AI (ref 0.0–0.9)
ENA SSB (LA) Ab: <0.2 AI (ref 0.0–0.9)
Scleroderma (Scl-70) (ENA) Antibody, IgG: <0.2 AI (ref 0.0–0.9)
dsDNA Ab: 4 IU/mL (ref 0–9)

## 2022-08-02 LAB — FANA STAINING PATTERNS
Homogeneous Pattern: 1:320 {titer} — ABNORMAL HIGH
Speckled Pattern: 1:80 {titer}

## 2022-08-07 ENCOUNTER — Ambulatory Visit (HOSPITAL_BASED_OUTPATIENT_CLINIC_OR_DEPARTMENT_OTHER)
Admission: RE | Admit: 2022-08-07 | Discharge: 2022-08-07 | Disposition: A | Discharge status: Home / Self Care | Payer: Medicare (Managed Care) | Source: Ambulatory Visit | Attending: Pulmonary Disease | Admitting: Pulmonary Disease

## 2022-08-07 ENCOUNTER — Encounter (HOSPITAL_BASED_OUTPATIENT_CLINIC_OR_DEPARTMENT_OTHER): Payer: Self-pay

## 2022-08-07 DIAGNOSIS — J849 Interstitial pulmonary disease, unspecified: Secondary | ICD-10-CM | POA: Insufficient documentation

## 2022-08-07 DIAGNOSIS — J8410 Pulmonary fibrosis, unspecified: Secondary | ICD-10-CM | POA: Diagnosis not present

## 2022-08-07 DIAGNOSIS — R918 Other nonspecific abnormal finding of lung field: Secondary | ICD-10-CM | POA: Diagnosis not present

## 2022-08-08 DIAGNOSIS — L821 Other seborrheic keratosis: Secondary | ICD-10-CM | POA: Diagnosis not present

## 2022-08-08 DIAGNOSIS — L578 Other skin changes due to chronic exposure to nonionizing radiation: Secondary | ICD-10-CM | POA: Diagnosis not present

## 2022-08-08 DIAGNOSIS — L570 Actinic keratosis: Secondary | ICD-10-CM | POA: Diagnosis not present

## 2022-08-17 ENCOUNTER — Ambulatory Visit
Admission: RE | Admit: 2022-08-17 | Discharge: 2022-08-17 | Disposition: A | Discharge status: Home / Self Care | Payer: Medicare (Managed Care) | Source: Ambulatory Visit | Attending: Family Medicine | Admitting: Family Medicine

## 2022-08-17 DIAGNOSIS — Z1231 Encounter for screening mammogram for malignant neoplasm of breast: Secondary | ICD-10-CM

## 2022-08-21 DIAGNOSIS — R197 Diarrhea, unspecified: Secondary | ICD-10-CM | POA: Diagnosis not present

## 2022-10-05 DIAGNOSIS — J069 Acute upper respiratory infection, unspecified: Secondary | ICD-10-CM | POA: Diagnosis not present

## 2022-10-05 DIAGNOSIS — R059 Cough, unspecified: Secondary | ICD-10-CM | POA: Diagnosis not present

## 2022-10-08 ENCOUNTER — Other Ambulatory Visit: Payer: Self-pay

## 2022-10-08 ENCOUNTER — Observation Stay (HOSPITAL_BASED_OUTPATIENT_CLINIC_OR_DEPARTMENT_OTHER)
Admission: EM | Admit: 2022-10-08 | Discharge: 2022-10-09 | Disposition: A | Discharge status: Home / Self Care | Payer: Medicare (Managed Care) | Attending: Internal Medicine | Admitting: Internal Medicine

## 2022-10-08 ENCOUNTER — Encounter (HOSPITAL_BASED_OUTPATIENT_CLINIC_OR_DEPARTMENT_OTHER): Payer: Self-pay | Admitting: *Deleted

## 2022-10-08 ENCOUNTER — Encounter (HOSPITAL_COMMUNITY): Payer: Self-pay

## 2022-10-08 ENCOUNTER — Emergency Department (HOSPITAL_BASED_OUTPATIENT_CLINIC_OR_DEPARTMENT_OTHER): Payer: Medicare (Managed Care)

## 2022-10-08 DIAGNOSIS — N179 Acute kidney failure, unspecified: Secondary | ICD-10-CM | POA: Diagnosis not present

## 2022-10-08 DIAGNOSIS — B9620 Unspecified Escherichia coli [E. coli] as the cause of diseases classified elsewhere: Secondary | ICD-10-CM | POA: Diagnosis not present

## 2022-10-08 DIAGNOSIS — Z7982 Long term (current) use of aspirin: Secondary | ICD-10-CM | POA: Diagnosis not present

## 2022-10-08 DIAGNOSIS — N19 Unspecified kidney failure: Secondary | ICD-10-CM | POA: Diagnosis not present

## 2022-10-08 DIAGNOSIS — G9341 Metabolic encephalopathy: Secondary | ICD-10-CM | POA: Diagnosis not present

## 2022-10-08 DIAGNOSIS — I130 Hypertensive heart and chronic kidney disease with heart failure and stage 1 through stage 4 chronic kidney disease, or unspecified chronic kidney disease: Secondary | ICD-10-CM | POA: Insufficient documentation

## 2022-10-08 DIAGNOSIS — N3000 Acute cystitis without hematuria: Secondary | ICD-10-CM | POA: Insufficient documentation

## 2022-10-08 DIAGNOSIS — J449 Chronic obstructive pulmonary disease, unspecified: Secondary | ICD-10-CM | POA: Insufficient documentation

## 2022-10-08 DIAGNOSIS — J45909 Unspecified asthma, uncomplicated: Secondary | ICD-10-CM | POA: Insufficient documentation

## 2022-10-08 DIAGNOSIS — J432 Centrilobular emphysema: Secondary | ICD-10-CM | POA: Diagnosis not present

## 2022-10-08 DIAGNOSIS — E785 Hyperlipidemia, unspecified: Secondary | ICD-10-CM | POA: Diagnosis present

## 2022-10-08 DIAGNOSIS — A419 Sepsis, unspecified organism: Secondary | ICD-10-CM | POA: Diagnosis present

## 2022-10-08 DIAGNOSIS — J849 Interstitial pulmonary disease, unspecified: Secondary | ICD-10-CM | POA: Diagnosis present

## 2022-10-08 DIAGNOSIS — E782 Mixed hyperlipidemia: Secondary | ICD-10-CM

## 2022-10-08 DIAGNOSIS — J069 Acute upper respiratory infection, unspecified: Secondary | ICD-10-CM

## 2022-10-08 DIAGNOSIS — N1832 Chronic kidney disease, stage 3b: Secondary | ICD-10-CM | POA: Diagnosis not present

## 2022-10-08 DIAGNOSIS — I2510 Atherosclerotic heart disease of native coronary artery without angina pectoris: Secondary | ICD-10-CM | POA: Diagnosis not present

## 2022-10-08 DIAGNOSIS — Z1152 Encounter for screening for COVID-19: Secondary | ICD-10-CM | POA: Diagnosis not present

## 2022-10-08 DIAGNOSIS — E860 Dehydration: Secondary | ICD-10-CM | POA: Diagnosis not present

## 2022-10-08 DIAGNOSIS — Z951 Presence of aortocoronary bypass graft: Secondary | ICD-10-CM | POA: Diagnosis not present

## 2022-10-08 DIAGNOSIS — N390 Urinary tract infection, site not specified: Secondary | ICD-10-CM | POA: Diagnosis not present

## 2022-10-08 DIAGNOSIS — J301 Allergic rhinitis due to pollen: Secondary | ICD-10-CM | POA: Diagnosis not present

## 2022-10-08 DIAGNOSIS — R5383 Other fatigue: Secondary | ICD-10-CM | POA: Diagnosis not present

## 2022-10-08 DIAGNOSIS — Z85828 Personal history of other malignant neoplasm of skin: Secondary | ICD-10-CM | POA: Insufficient documentation

## 2022-10-08 DIAGNOSIS — I10 Essential (primary) hypertension: Secondary | ICD-10-CM | POA: Diagnosis present

## 2022-10-08 DIAGNOSIS — I5032 Chronic diastolic (congestive) heart failure: Secondary | ICD-10-CM | POA: Insufficient documentation

## 2022-10-08 DIAGNOSIS — Z79899 Other long term (current) drug therapy: Secondary | ICD-10-CM | POA: Insufficient documentation

## 2022-10-08 DIAGNOSIS — Z955 Presence of coronary angioplasty implant and graft: Secondary | ICD-10-CM | POA: Diagnosis not present

## 2022-10-08 DIAGNOSIS — J309 Allergic rhinitis, unspecified: Secondary | ICD-10-CM | POA: Diagnosis present

## 2022-10-08 DIAGNOSIS — D509 Iron deficiency anemia, unspecified: Secondary | ICD-10-CM | POA: Diagnosis not present

## 2022-10-08 DIAGNOSIS — R059 Cough, unspecified: Secondary | ICD-10-CM | POA: Diagnosis not present

## 2022-10-08 DIAGNOSIS — R0902 Hypoxemia: Secondary | ICD-10-CM | POA: Diagnosis not present

## 2022-10-08 DIAGNOSIS — E039 Hypothyroidism, unspecified: Secondary | ICD-10-CM | POA: Diagnosis not present

## 2022-10-08 DIAGNOSIS — R509 Fever, unspecified: Secondary | ICD-10-CM | POA: Diagnosis not present

## 2022-10-08 DIAGNOSIS — R6520 Severe sepsis without septic shock: Secondary | ICD-10-CM | POA: Diagnosis present

## 2022-10-08 DIAGNOSIS — I5033 Acute on chronic diastolic (congestive) heart failure: Secondary | ICD-10-CM | POA: Diagnosis present

## 2022-10-08 DIAGNOSIS — Z87891 Personal history of nicotine dependence: Secondary | ICD-10-CM | POA: Diagnosis not present

## 2022-10-08 DIAGNOSIS — R531 Weakness: Secondary | ICD-10-CM | POA: Diagnosis not present

## 2022-10-08 HISTORY — DX: Interstitial pulmonary disease, unspecified: J84.9

## 2022-10-08 LAB — CBC WITH DIFFERENTIAL/PLATELET
Abs Immature Granulocytes: 0.06 10*3/uL (ref 0.00–0.07)
Basophils Absolute: 0 10*3/uL (ref 0.0–0.1)
Basophils Relative: 0 %
Eosinophils Absolute: 0.1 10*3/uL (ref 0.0–0.5)
Eosinophils Relative: 1 %
HCT: 30.8 % — ABNORMAL LOW (ref 36.0–46.0)
Hemoglobin: 10.4 g/dL — ABNORMAL LOW (ref 12.0–15.0)
Immature Granulocytes: 1 %
Lymphocytes Relative: 11 %
Lymphs Abs: 1.1 10*3/uL (ref 0.7–4.0)
MCH: 33.7 pg (ref 26.0–34.0)
MCHC: 33.8 g/dL (ref 30.0–36.0)
MCV: 99.7 fL (ref 80.0–100.0)
Monocytes Absolute: 1.3 10*3/uL — ABNORMAL HIGH (ref 0.1–1.0)
Monocytes Relative: 12 %
Neutro Abs: 8 10*3/uL — ABNORMAL HIGH (ref 1.7–7.7)
Neutrophils Relative %: 75 %
Platelets: 123 10*3/uL — ABNORMAL LOW (ref 150–400)
RBC: 3.09 MIL/uL — ABNORMAL LOW (ref 3.87–5.11)
RDW: 12.7 % (ref 11.5–15.5)
WBC: 10.6 10*3/uL — ABNORMAL HIGH (ref 4.0–10.5)
nRBC: 0 % (ref 0.0–0.2)

## 2022-10-08 LAB — COMPREHENSIVE METABOLIC PANEL
ALT: 6 U/L (ref 0–44)
AST: 16 U/L (ref 15–41)
Albumin: 4.1 g/dL (ref 3.5–5.0)
Alkaline Phosphatase: 49 U/L (ref 38–126)
Anion gap: 11 (ref 5–15)
BUN: 45 mg/dL — ABNORMAL HIGH (ref 8–23)
CO2: 22 mmol/L (ref 22–32)
Calcium: 9.6 mg/dL (ref 8.9–10.3)
Chloride: 105 mmol/L (ref 98–111)
Creatinine, Ser: 1.8 mg/dL — ABNORMAL HIGH (ref 0.44–1.00)
GFR, Estimated: 27 mL/min — ABNORMAL LOW (ref >60)
Glucose, Bld: 162 mg/dL — ABNORMAL HIGH (ref 70–99)
Potassium: 4.5 mmol/L (ref 3.5–5.1)
Sodium: 138 mmol/L (ref 135–145)
Total Bilirubin: 0.6 mg/dL (ref 0.3–1.2)
Total Protein: 7 g/dL (ref 6.5–8.1)

## 2022-10-08 LAB — URINALYSIS, ROUTINE W REFLEX MICROSCOPIC
Bilirubin Urine: NEGATIVE
Glucose, UA: NEGATIVE mg/dL
Hgb urine dipstick: NEGATIVE
Ketones, ur: NEGATIVE mg/dL
Nitrite: POSITIVE — AB
Specific Gravity, Urine: 1.015 (ref 1.005–1.030)
pH: 5 (ref 5.0–8.0)

## 2022-10-08 LAB — RESP PANEL BY RT-PCR (RSV, FLU A&B, COVID)  RVPGX2
Influenza A by PCR: NEGATIVE
Influenza B by PCR: NEGATIVE
Resp Syncytial Virus by PCR: NEGATIVE
SARS Coronavirus 2 by RT PCR: NEGATIVE

## 2022-10-08 LAB — GLUCOSE, CAPILLARY: Glucose-Capillary: 132 mg/dL — ABNORMAL HIGH (ref 70–99)

## 2022-10-08 LAB — LACTIC ACID, PLASMA
Lactic Acid, Venous: 1 mmol/L (ref 0.5–1.9)
Lactic Acid, Venous: 0.5 mmol/L (ref 0.5–1.9)

## 2022-10-08 LAB — PROTIME-INR
INR: 1.1 (ref 0.8–1.2)
Prothrombin Time: 14.5 seconds (ref 11.4–15.2)

## 2022-10-08 MED ORDER — ACETAMINOPHEN 500 MG PO TABS
1000.0000 mg | ORAL_TABLET | Freq: Once | ORAL | Status: AC
  Administered 2022-10-08: 1000 mg via ORAL
  Filled 2022-10-08: qty 2

## 2022-10-08 MED ORDER — FERROUS SULFATE 325 (65 FE) MG PO TABS
325.0000 mg | ORAL_TABLET | Freq: Every day | ORAL | Status: DC
  Administered 2022-10-09: 325 mg via ORAL
  Filled 2022-10-08: qty 1

## 2022-10-08 MED ORDER — IPRATROPIUM BROMIDE 0.06 % NA SOLN
2.0000 | Freq: Two times a day (BID) | NASAL | Status: DC
  Filled 2022-10-08: qty 15

## 2022-10-08 MED ORDER — LEVOTHYROXINE SODIUM 75 MCG PO TABS
175.0000 ug | ORAL_TABLET | Freq: Every evening | ORAL | Status: DC
  Administered 2022-10-08: 175 ug via ORAL
  Filled 2022-10-08: qty 1

## 2022-10-08 MED ORDER — ACETAMINOPHEN 325 MG PO TABS
650.0000 mg | ORAL_TABLET | Freq: Four times a day (QID) | ORAL | Status: DC | PRN
  Administered 2022-10-09: 650 mg via ORAL
  Filled 2022-10-08: qty 2

## 2022-10-08 MED ORDER — ASPIRIN 81 MG PO CHEW
81.0000 mg | CHEWABLE_TABLET | Freq: Every day | ORAL | Status: DC
  Administered 2022-10-09: 81 mg via ORAL
  Filled 2022-10-08: qty 1

## 2022-10-08 MED ORDER — ALBUTEROL SULFATE (2.5 MG/3ML) 0.083% IN NEBU: 2.5000 mg | INHALATION_SOLUTION | RESPIRATORY_TRACT | Status: DC | PRN

## 2022-10-08 MED ORDER — MONTELUKAST SODIUM 10 MG PO TABS
10.0000 mg | ORAL_TABLET | Freq: Every day | ORAL | Status: DC
  Administered 2022-10-09: 10 mg via ORAL
  Filled 2022-10-08: qty 1

## 2022-10-08 MED ORDER — LACTATED RINGERS IV SOLN: INTRAVENOUS | Status: AC

## 2022-10-08 MED ORDER — SODIUM CHLORIDE 0.9 % IV SOLN
1.0000 g | INTRAVENOUS | Status: DC
  Administered 2022-10-09: 1 g via INTRAVENOUS
  Filled 2022-10-08: qty 10

## 2022-10-08 MED ORDER — ACETAMINOPHEN 650 MG RE SUPP: 650.0000 mg | Freq: Four times a day (QID) | RECTAL | Status: DC | PRN

## 2022-10-08 MED ORDER — ATORVASTATIN CALCIUM 80 MG PO TABS
80.0000 mg | ORAL_TABLET | Freq: Every day | ORAL | Status: DC
  Administered 2022-10-09: 80 mg via ORAL
  Filled 2022-10-08: qty 1

## 2022-10-08 MED ORDER — MELATONIN 3 MG PO TABS: 3.0000 mg | ORAL_TABLET | Freq: Every evening | ORAL | Status: DC | PRN

## 2022-10-08 MED ORDER — BENZONATATE 100 MG PO CAPS
200.0000 mg | ORAL_CAPSULE | Freq: Two times a day (BID) | ORAL | Status: DC
  Administered 2022-10-08 – 2022-10-09 (×2): 200 mg via ORAL
  Filled 2022-10-08 (×2): qty 2

## 2022-10-08 MED ORDER — SODIUM CHLORIDE 0.9 % IV BOLUS
1000.0000 mL | Freq: Once | INTRAVENOUS | Status: AC
  Administered 2022-10-08: 1000 mL via INTRAVENOUS

## 2022-10-08 MED ORDER — SODIUM CHLORIDE 0.9 % IV SOLN
1.0000 g | Freq: Once | INTRAVENOUS | Status: AC
  Administered 2022-10-08: 1 g via INTRAVENOUS
  Filled 2022-10-08: qty 10

## 2022-10-08 NOTE — H&P (Signed)
History and Physical      Cindy [name]:[id number] DOB: 01-Nov-1931 DOA: 10/08/2022  PCP: Lawerance Cruel, MD  Patient coming from: home   I have personally briefly reviewed patient's old medical records in [facility]  Chief Complaint: Confusion  HPI: Cindy [name] is a 88 y.o. female with medical history significant for CKD 3B with baseline creatinine 1.2-1.5, interstitial lung disease, COPD, hyperlipidemia, chronic thyroidism, essential pretension, chronic diastolic heart failure who is admitted to [facility] on 10/08/2022 by way of transfer from [facility] emergency department with acute metabolic encephalopathy after presenting from home to the latter facility for evaluation of confusion.  In context of the patient's confusion, the following history is provided by the patient's husband, in addition to chart review.  Patient with confusion relative to her baseline will status over the last 1 to 2 days.  This has been associated with 2 to 3 days of subjective fever followed by development of objective fever on 10/07/2022.  Husband also conveys that with the patient has a chronic cough in the context of underlying interstitial lung disease, that the cough seems slightly worse over the last few days, all remaining nonproductive.  The patient is not complaining of any acute shortness of breath, chest pain.  No recent nausea, vomiting, diarrhea, abdominal pain, rash.  Unclear if she has been experiencing any recent dysuria.  In the setting of the patient's altered mental status, has been increased that she is exhibited evidence of diminished oral intake over the last 1 to 2 days.  Medical history notable for chronic diastolic heart failure, with most recent echo in February 2020 notable for LVEF 55 to 60%, mild LVH with evidence of impaired diastolic relaxation, normal right ventricular systolic function, moderately dilated left atrium, and no evidence of significant  valvular pathology.  She is on a lactone as well as HCTZ at home.     [facility] ED Course:  Vital signs in the ED were notable for the following: Temperature max 102.2; heart rate 09-32; initial systolic blood pressures in the low 90s, slightly improving into the range of 130s to 140s following interval IV fluids he was further quantified below; respiratory rate 18-22, oxygen saturation 97 1% on room air.  Labs were notable for the following: CMP notable for sodium 138, bicarbonate 22, creatinine 1.80 compared measures prior value of 1.52 on 07/31/2022, BUN/creatinine ratio 25, glucose 162 and liver enzymes were found to be within normal limits.  Initial lactate 1.0, therapy value trending down to 0.5.  CBC notable for white blood cell count 10,675% neutrophils, hemoglobin 10.46 with normocytic/normochromic properties as well as nonelevated RDW, relative to this recent prior hemoglobin of 10.6 in July 2023.  INR 1.1.  Urinalysis notable for 11-20 white blood cells, many bacteria, nitrate positive, moderate leukocyte E[place]ace, trace protein and no evidence of RBCs.  Blood cultures x 2 as well as urine culture collected prior to initiation of IV antibiotics.  COVID, influenza, RSV PCR all negative.  Imaging and additional notable ED work-up: Chest x-ray, per final radiology read, shows no evidence of acute cardiopulmonary process, including no evidence of infiltrate, edema, effusion, or pneumothorax.  Chest x-ray did show evidence of chronic linear scarring versus atelectasis in the left base.  While in the ED, the following were administered: Acetaminophen 1 g p.o. x 1, Rocephin 1 g IV x 1, normal saline x 1 L bolus.  Subsequently, the patient was admitted to [facility] for further evaluation  and management of acute metabolic encephalopathy in the setting of severe sepsis due to acute cystitis, with presentation also notable for acute kidney injury superimposed on CKD 3B.     Review of Systems: As  per HPI otherwise 10 point review of systems negative.   Past Medical History:  Diagnosis Date   Asthma    Bronchitis    COPD (chronic obstructive pulmonary disease) ([place])    Coronary artery disease    MI age 71   GERD (gastroesophageal reflux disease)    Heart attack ([place])    Hypercholesteremia    Hypertension    Iron deficiency anemia    used to see hem in [place], [place].    Renal insufficiency    Thyroid disease     Past Surgical History:  Procedure Laterality Date   APPENDECTOMY     BREAST BIOPSY Right    benign   CORONARY ANGIOPLASTY WITH STENT PLACEMENT     CORONARY ARTERY BYPASS GRAFT  09/1979   LEFT HEART CATH AND CORS/GRAFTS ANGIOGRAPHY N/A 11/27/2018   Procedure: LEFT HEART CATH AND CORS/GRAFTS ANGIOGRAPHY;  Surgeon: Lorretta Harp, MD;  Location: [facility];  Service: Cardiovascular;  Laterality: N/A;   Moh's Surgeries     for squamous cell carcinoma   PARTIAL HYSTERECTOMY  10/1978    Social History:  reports that she quit smoking about 42 years ago. Her smoking use included cigarettes. She has a 33.00 pack-year smoking history. She has never used smokeless tobacco. She reports current alcohol use. She reports that she does not use drugs.   Allergies  Allergen Reactions   Codeine Other (See Comments)    Headache from heavy doses of codeine only    Family History  Problem Relation Age of Onset   Heart disease Mother    Heart disease Father    Melanoma Sister        x2   Breast cancer Neg Hx     Family history reviewed and not pertinent    Prior to Admission medications   Medication Sig Start Date End Date Taking? Authorizing Provider  acetaminophen (TYLENOL) 325 MG tablet Take 2 tablets (650 mg total) by mouth every 6 (six) hours as needed for mild pain (or Fever >/= 101). 12/15/21   Sheikh, Omair Latif, DO  albuterol (VENTOLIN HFA) 108 (90 Base) MCG/ACT inhaler Inhale 1 puff into the lungs every 4 (four) hours as needed for wheezing or shortness  of breath. 11/17/21   [provider]  aspirin 81 MG tablet Take 81 mg by mouth daily.     [provider]  atorvastatin (LIPITOR) 80 MG tablet Take 1 tablet (80 mg total) by mouth daily. 12/25/18   Daune Perch, NP  benzonatate (TESSALON) 200 MG capsule Take 1 capsule by mouth twice daily as needed for cough Patient taking differently: Take 200 mg by mouth 2 (two) times daily. 09/23/20   Rigoberto Noel, MD  Calcium Carbonate-Vitamin D 600-200 MG-UNIT TABS Take 1 tablet by mouth 2 (two) times daily.    [provider]  ferrous sulfate 325 (65 FE) MG tablet Take 325 mg by mouth daily with breakfast.    [provider]  fexofenadine (ALLEGRA) 180 MG tablet Take 180 mg by mouth daily.    [provider]  guaifenesin (HUMIBID E) 400 MG TABS tablet Take 400 mg by mouth every 4 (four) hours as needed. 02/20/22   [provider]  ipratropium (ATROVENT) 0.06 % nasal spray Place  2 sprays into both nostrils 2 (two) times daily.    [provider]  isosorbide mononitrate (IMDUR) 30 MG 24 hr tablet Take 90 mg by mouth every morning. 11/13/21   [provider]  levothyroxine (SYNTHROID, LEVOTHROID) 175 MCG tablet Take 175 mcg by mouth every evening.    [provider]  losartan (COZAAR) 50 MG tablet Take 50 mg by mouth daily.    [provider]  metoprolol (TOPROL-XL) 50 MG 24 hr tablet Take 50 mg by mouth daily.      [provider]  montelukast (SINGULAIR) 10 MG tablet Take 10 mg by mouth daily. 08/31/20   [provider]  Niacinamide-Zn-Cu-Methfo-Se-Cr (NICOTINAMIDE PO) Take 1 capsule by mouth 2 (two) times daily.    [provider]  nitroGLYCERIN (NITROSTAT) 0.4 MG SL tablet PLACE 1 TABLET UNDER THE TONGUE EVERY 5 MINUTES AS NEEDED FOR CHEST PAIN Patient taking differently: Place 0.4 mg under the tongue every 5 (five) minutes as needed for chest pain. 09/11/19   Jerline Pain, MD  Polyethyl  Glycol-Propyl Glycol (SYSTANE OP) Apply 1-2 drops to eye at bedtime as needed (dry eyes).    [provider]  spironolactone-hydrochlorothiazide (ALDACTAZIDE) 25-25 MG tablet Take 1 tablet by mouth daily.    [provider]  traMADol (ULTRAM) 50 MG tablet Take 50 mg by mouth 2 (two) times daily.    [provider]     Objective    Physical Exam: Vitals:   10/08/22 1443 10/08/22 1600 10/08/22 1856 10/08/22 2033  BP:  (!) 140/52 137/61 (!) 157/75  Pulse:  68 67 79  Resp:  _0 Temp: 98.9 F (37.2 C)  98.9 F (37.2 C) 98.7 F (37.1 C)  TempSrc: Oral  Oral Oral  SpO2:  97% 100% 100%    General: appears to be stated age; alert, confused Skin: warm, dry, no rash Head:  AT/[place] Mouth:  Oral mucosa membranes appear dry, normal dentition Neck: supple; trachea midline Heart:  RRR; did not appreciate any M/R/G Lungs: CTAB, did not appreciate any wheezes, rales, or rhonchi Abdomen: + BS; soft, ND, NT Vascular: 2+ pedal pulses b/l; 2+ radial pulses b/l Extremities: no peripheral edema, no muscle wasting Neuro: strength and sensation intact in upper and lower extremities b/l    Labs on Admission: I have personally reviewed following labs and imaging studies  CBC: Recent Labs  Lab 10/08/22 0353  WBC 10.6*  NEUTROABS 8.0*  HGB 10.4*  HCT 30.8*  MCV 99.7  PLT 254*   Basic Metabolic Panel: Recent Labs  Lab 10/08/22 0353  NA 138  K 4.5  CL 105  CO2 22  GLUCOSE 162*  BUN 45*  CREATININE 1.80*  CALCIUM 9.6   GFR: CrCl cannot be calculated (Unknown ideal weight.). Liver Function Tests: Recent Labs  Lab 10/08/22 0353  AST 16  ALT 6  ALKPHOS 49  BILITOT 0.6  PROT 7.0  ALBUMIN 4.1   No results for input(s): "LIPASE", "AMYLASE" in the last 168 hours. No results for input(s): "AMMONIA" in the last 168 hours. Coagulation Profile: Recent Labs  Lab 10/08/22 0353  INR 1.1   Cardiac Enzymes: No results for input(s): "CKTOTAL", "CKMB",  "CKMBINDEX", "TROPONINI" in the last 168 hours. BNP (last 3 results) No results for input(s): "PROBNP" in the last 8760 hours. HbA1C: No results for input(s): "HGBA1C" in the last 72 hours. CBG: No results for input(s): "GLUCAP" in the last 168 hours. Lipid Profile: No results for  input(s): "CHOL", "HDL", "LDLCALC", "TRIG", "CHOLHDL", "LDLDIRECT" in the last 72 hours. Thyroid Function Tests: No results for input(s): "TSH", "T4TOTAL", "FREET4", "T3FREE", "THYROIDAB" in the last 72 hours. Anemia Panel: No results for input(s): "VITAMINB12", "FOLATE", "FERRITIN", "TIBC", "IRON", "RETICCTPCT" in the last 72 hours. Urine analysis:    Component Value Date/Time   COLORURINE YELLOW 10/08/2022 0336   APPEARANCEUR CLEAR 10/08/2022 0336   LABSPEC 1.015 10/08/2022 0336   PHURINE 5.0 10/08/2022 0336   GLUCOSEU NEGATIVE 10/08/2022 0336   HGBUR NEGATIVE 10/08/2022 0336   BILIRUBINUR NEGATIVE 10/08/2022 0336   KETONESUR NEGATIVE 10/08/2022 0336   PROTEINUR TRACE (A) 10/08/2022 0336   NITRITE POSITIVE (A) 10/08/2022 0336   LEUKOCYTESUR MODERATE (A) 10/08/2022 0336    Radiological Exams on Admission: DG Chest Portable 1 View  Result Date: 10/08/2022 CLINICAL DATA:  Hypoxia, coughing and fever.  Altered mentation. EXAM: PORTABLE CHEST 1 VIEW COMPARISON:  High-resolution chest CT 08/07/2022 FINDINGS: There is chronic linear scarring or atelectasis in the left base overlying the hemidiaphragm. There are mild chronic subpleural interstitial changes of the lungs. No focal pneumonia is evident. The cardiomediastinal silhouette and vascular pattern are normal. Calcification again noted in the transverse aorta and old median sternotomy sutures. There is osteopenia and degenerative change of the spine and shoulders. IMPRESSION: Chronic linear scarring or atelectasis in the left base. No evidence of acute cardiopulmonary disease. Electronically Signed   By: Telford Nab M.D.   On: 10/08/2022 05:00       Assessment/Plan   Principal Problem:   Acute metabolic encephalopathy Active Problems:   Chronic iron deficiency anemia   Interstitial lung disease (HCC)   Allergic rhinitis   HLD (hyperlipidemia)   Acquired hypothyroidism   Essential hypertension   Severe sepsis (HCC)   Acute cystitis   Dehydration   Acute renal failure superimposed on stage 3b chronic kidney disease (HCC)   Acute prerenal azotemia   Fever   COPD (chronic obstructive pulmonary disease) (HCC)   Chronic diastolic CHF (congestive heart failure) (HCC)       #) Acute metabolic encephalopathy: 1 to 2 days of confusion relative to baseline mental status, which appears to be on the basis of physiologic stressors stemming from presenting severe sepsis due to urinary tract infection.  No obvious additional contributory underlying infectious process at this time, including negative COVID-19/influenza PCR performed today, while chest x-ray shows no evidence of acute process,, including no evidence of infiltrate.  Potential additional metabolic contributions towards her presenting encephalopathy include acute kidney injury superimposed on CKD 3B resulting in diminished renal clearance normal central acting medications as well as evidence of dehydration.  In the context of documented history of interstitial lung disease as well as obstructive lung disease, also check blood gas to evaluate for any contribution from hypercapnic encephalopathy.  No overt acute focal neurologic deficits to suggest contribution from underlying acute CVA.  Plan: fall precautions. Repeat CMP/CBC in the AM. Check magnesium level. check TSH, vbg .  Hold him scheduled tramadol for now.  Will also hold scheduled Allegra for any contribution from its anticholinergic side effects.  Further evaluation management of severe sepsis due to UTI, including continuation of IV Rocephin, as below.  Gentle IV fluids.  Add on procalcitonin level.  Check CPK.  Delirium  precautions.             #) Severe sepsis due to acute cystitis: Magnets on the basis of presenting 1 to 2 days of confusion relative to baseline mental status  associated with 2 to 3 days of subjective fever followed by development of objective fever over the course the last day, with urinalysis suggestive of underlying urinary tract infection.   SIRS criteria met via presenting objective fever, with temperature max 102.2, along with mild tachypnea. Lactic acid level: Initially 1.0, with subsequent value trending down to 0.5. Of note, given the associated presence of suspected end organ damage in the form of concominant presenting acute metabolic encephalopathy as well as AKI on CKD 3B, criteria are met for pt's sepsis to be considered severe in nature. However, in the absence of lactic acid level that is greater than or equal to 4.0, and in the absence of any associated hypotension refractory to IVF's, there are no indications for administration of a 30 mL/kg IVF bolus at this time.   Additional ED work-up/management notable for: Collection of blood cultures x 2 as well as urine culture prior to initiation of Rocephin, which will be continued for her acute cystitis.  As described above above, no e/o additional infectious process at this time.    Plan: CBC w/ diff and CMP in AM.  Follow for results of blood cx's x 2 as well as urine culture. Abx: Continue Rocephin.  Gentle continuous lactated Ringer's.  Procalcitonin level.  Prn acetaminophen for additional fever.            #) Acute Kidney Injury superimposed on CKD 3B:  as quantified above.  Appears prerenal in nature in the context of dehydration as well as severe sepsis, inconsistently finding her acute prerenal azotemia.  Urinalysis with microscopy is consistent with presenting urinary tract infection, without additional urinary casts noted.  Pharmacologic contribute factors include losartan, and multiple home diuretics, as  above.     Plan: monitor strict I's & O's and daily weights. Attempt to avoid nephrotoxic agents.  Holding losartan, HCTZ, spironolactone.  Refrain from NSAIDs. Repeat CMP in the morning. Check serum magnesium level. Add-on random urine sodium and random urine creatinine.  In the setting of confusion and AKI, will also add on CPK level.  Gentle IV fluids, as above.            #) Dehydration: Clinical suspicion for such, including the appearance of dry oral mucous membranes as well as laboratory findings notable for acute prerenal azotemia with acute kidney injury. Appears to be in the setting of   recent decline in oral intake resulting from the patient's presenting altered mental status over the last few days.  She was initially borderline hypotensive in the ED, with initial systolic blood pressures in the low 90s, so still improving into the 130s to 140s following initiation of IV fluids/IV antibiotics, as above.     Plan: Monitor strict I's and O's.  Daily weights.  Repeat CMP in the morning. IVF's in form of LR at 50 cc/h x 8 hours.             #) Interstitial lung disease/COPD: Per chart review, she reportedly has underlying history of both restrictive and obstructive lung disease, associated with chronic nonproductive cough.  Outpatient respiratory regimen includes scheduled Singulair as well as prn albuterol.  No clinical evidence to suggest acute exacerbation thereof, as has been conveys that she has exhibited a slight increase in her chronic cough over the last few days, which has not been associate with any shortness of breath, any acute hypoxia, while chest x-ray shows no evidence of acute process.  However, in the setting of worsening acute encephalopathy,  will also check blood gas.   Plan: Prn albuterol nebulizer.  Continue on Singulair.  Check serum magnesium and phosphorus levels.  Check VBG.              #) Hyperlipidemia: documented h/o such. On high  intensity atorvastatin as outpatient.   Plan: continue home statin for now, while follow-up results of CPK level, as above.                #) acquired hypothyroidism: documented h/o such, on Synthroid as outpatient.   Plan: cont home Synthroid.  Check TSH.             #) Essential Hypertension: documented h/o such, with outpatient antihypertensive regimen including Imdur, losartan, metoprolol succinate, spironolactone, HCTZ.  In the setting of severe sepsis as well as soft initial blood pressures, in addition to AKI/dehydration, will hold these home and hypertensives for now.  Plan: Close monitoring of subsequent BP via routine VS. holding blood pressure medications for now, as above.  Monitor strict I's and O's and weights.            #) Chronic iron deficiency anemia: Documented history of such, a/w with baseline hgb range 10-12, with presenting hgb consistent with this range, in the absence of any overt evidence of active bleed.  On daily oral iron supplementation at home.  Presenting INR 1.1.   Plan: Repeat CBC in the morning.  Continue daily oral iron supplementation.            #) Allergic Rhinitis: documented h/o such, on scheduled Allegra as outpatient.  In the setting of worsening acute encephalopathy, will hold home Allegra for now 40 potential contributory anticholinergic side effects contributing  to her confusion.    Plan: [place] for now.          #) Chronic diastolic heart failure: documented history of such, with most recent echocardiogram performed February 2021, notable for LVEF 55 to 60% as well as severe diastolic function. No clinical or radiographic evidence to suggest acutely decompensated heart failure at this time.  Rather, clinically, she appears mildly dehydrated at presentation.  Consequently, we will hold diuretic medications consisting of spironolactone and HCTZ.     Plan: monitor strict I's & O's and  daily weights. Repeat CMP in AM. Check serum mag level.  Hold home diuretic regimen.  Add on BNP.     DVT prophylaxis: SCD's   Code Status: Full code Disposition Plan: Per Rounding Team Consults called: none;  Admission status: obs     I SPENT GREATER THAN 75  MINUTES IN CLINICAL CARE TIME/MEDICAL DECISION-MAKING IN COMPLETING THIS ADMISSION.      [place] DO [facility]  From [place]   10/08/2022, 9:32 PM

## 2022-10-08 NOTE — ED Provider Notes (Signed)
[facility] EMERGENCY DEPT Provider Note   CSN: [id number] Arrival date & time: 10/08/22  0303     History  Chief Complaint  Patient presents with   Other    Low 02 sats     Cindy [name] is a 88 y.o. female.  Patient is an 88 year old female with past medical history of chronic renal insufficiency, interstitial lung disease, hyperlipidemia, hypothyroidism, hypertension.  Patient brought by EMS for evaluation of a 4-day history of cough, congestion, weakness, and intermittent confusion.  She had a virtual visit with her primary doctor several days ago and was prescribed Tessalon Perles.  These do not seem to be helping.  This evening she became more disoriented and husband called 911.  She was found to have oxygen saturations of 91% while on room air.  Patient has not required oxygen in the past.  The history is provided by the patient.       Home Medications Prior to Admission medications   Medication Sig Start Date End Date Taking? Authorizing Provider  acetaminophen (TYLENOL) 325 MG tablet Take 2 tablets (650 mg total) by mouth every 6 (six) hours as needed for mild pain (or Fever >/= 101). 12/15/21   Sheikh, Omair Latif, DO  albuterol (VENTOLIN HFA) 108 (90 Base) MCG/ACT inhaler Inhale 1 puff into the lungs every 4 (four) hours as needed for wheezing or shortness of breath. 11/17/21   [provider]  aspirin 81 MG tablet Take 81 mg by mouth daily.     [provider]  atorvastatin (LIPITOR) 80 MG tablet Take 1 tablet (80 mg total) by mouth daily. 12/25/18   Daune Perch, NP  benzonatate (TESSALON) 200 MG capsule Take 1 capsule by mouth twice daily as needed for cough Patient taking differently: Take 200 mg by mouth 2 (two) times daily. 09/23/20   Rigoberto Noel, MD  Calcium Carbonate-Vitamin D 600-200 MG-UNIT TABS Take 1 tablet by mouth 2 (two) times daily.    [provider]  ferrous sulfate 325 (65 FE) MG tablet Take 325 mg by mouth  daily with breakfast.    [provider]  fexofenadine (ALLEGRA) 180 MG tablet Take 180 mg by mouth daily.    [provider]  guaifenesin (HUMIBID E) 400 MG TABS tablet Take 400 mg by mouth every 4 (four) hours as needed. 02/20/22   [provider]  ipratropium (ATROVENT) 0.06 % nasal spray Place 2 sprays into both nostrils 2 (two) times daily.    [provider]  isosorbide mononitrate (IMDUR) 30 MG 24 hr tablet Take 90 mg by mouth every morning. 11/13/21   [provider]  levothyroxine (SYNTHROID, LEVOTHROID) 175 MCG tablet Take 175 mcg by mouth every evening.    [provider]  losartan (COZAAR) 50 MG tablet Take 50 mg by mouth daily.    [provider]  metoprolol (TOPROL-XL) 50 MG 24 hr tablet Take 50 mg by mouth daily.      [provider]  montelukast (SINGULAIR) 10 MG tablet Take 10 mg by mouth daily. 08/31/20   [provider]  Niacinamide-Zn-Cu-Methfo-Se-Cr (NICOTINAMIDE PO) Take 1 capsule by mouth 2 (two) times daily.    [provider]  nitroGLYCERIN (NITROSTAT) 0.4 MG SL tablet PLACE 1 TABLET UNDER THE TONGUE EVERY 5 MINUTES AS NEEDED FOR CHEST PAIN Patient taking differently: Place 0.4 mg under the tongue every 5 (five) minutes as needed for chest pain. 09/11/19   Jerline Pain, MD  Polyethyl Glycol-Propyl  Glycol (SYSTANE OP) Apply 1-2 drops to eye at bedtime as needed (dry eyes).    [provider]  spironolactone-hydrochlorothiazide (ALDACTAZIDE) 25-25 MG tablet Take 1 tablet by mouth daily.    [provider]  traMADol (ULTRAM) 50 MG tablet Take 50 mg by mouth 2 (two) times daily.    [provider]      Allergies    Codeine    Review of Systems   Review of Systems  All other systems reviewed and are negative.   Physical Exam Updated Vital Signs BP (!) 139/113   Pulse 76   Temp (!) 102.2 F (39 C)   Resp (!) 22   LMP  (LMP Unknown)   SpO2 100%   Physical Exam Vitals and nursing note reviewed.  Constitutional:      General: She is not in acute distress.    Appearance: She is well-developed. She is not diaphoretic.  HENT:     Head: Normocephalic and atraumatic.  Cardiovascular:     Rate and Rhythm: Normal rate and regular rhythm.     Heart sounds: No murmur heard.    No friction rub. No gallop.  Pulmonary:     Effort: Pulmonary effort is normal. No respiratory distress.     Breath sounds: Normal breath sounds. No wheezing.  Abdominal:     General: Bowel sounds are normal. There is no distension.     Palpations: Abdomen is soft.     Tenderness: There is no abdominal tenderness.  Musculoskeletal:        General: Normal range of motion.     Cervical back: Normal range of motion and neck supple.     Right lower leg: No edema.     Left lower leg: No edema.  Skin:    General: Skin is warm and dry.  Neurological:     General: No focal deficit present.     Mental Status: She is alert and oriented to person, place, and time.     ED Results / Procedures / Treatments   Labs (all labs ordered are listed, but only abnormal results are displayed) Labs Reviewed  CBC WITH DIFFERENTIAL/PLATELET - Abnormal; Notable for the following components:      Result Value   WBC 10.6 (*)    RBC 3.09 (*)    Hemoglobin 10.4 (*)    HCT 30.8 (*)    Platelets 123 (*)    Neutro Abs 8.0 (*)    Monocytes Absolute 1.3 (*)    All other components within normal limits  URINALYSIS, ROUTINE W REFLEX MICROSCOPIC - Abnormal; Notable for the following components:   Protein, ur TRACE (*)    Nitrite POSITIVE (*)    Leukocytes,Ua MODERATE (*)    Bacteria, UA MANY (*)    All other components within normal limits  CULTURE, BLOOD (ROUTINE X 2)  CULTURE, BLOOD (ROUTINE X 2)  RESP PANEL BY RT-PCR (RSV, FLU A&B, COVID)  RVPGX2  PROTIME-INR  COMPREHENSIVE METABOLIC PANEL  LACTIC ACID, PLASMA  LACTIC ACID, PLASMA    EKG None  Radiology No results  found.  Procedures Procedures    Medications Ordered in ED Medications  sodium chloride 0.9 % bolus 1,000 mL (has no administration in time range)  cefTRIAXone (ROCEPHIN) 1 g in sodium chloride 0.9 % 100 mL IVPB (has no administration in time range)    ED Course/ Medical Decision Making/ A&P  Patient is an 88 year old female presenting with fever and confusion.  She has had  a cough for the past 4 days and has been taking Tessalon with little relief.  This evening her confusion became worse and husband called 911.  Patient found to be hypoxic with oxygen saturations of 90 to 91%.  Patient arrives here with stable vital signs.  She is febrile with a temp of 102.  Physical examination is essentially unremarkable with no obvious source of her fever identified.  Workup initiated including CBC, metabolic panel, blood cultures, urinalysis, and chest x-ray.  Patient has a slight leukocytosis of 10.6, but laboratory studies otherwise unremarkable.  Her urinalysis is suggestive of a UTI with positive nitrate.  COVID, influenza, and RSV all negative.  Patient given Rocephin for presumed UTI.  Due to the patient's borderline hypoxia and confusion, she will be admitted to the hospitalist service for further workup.  Final Clinical Impression(s) / ED Diagnoses Final diagnoses:  None    Rx / DC Orders ED Discharge Orders     None         Veryl Speak, MD 10/08/22 [phone]

## 2022-10-08 NOTE — ED Triage Notes (Addendum)
Pt arrived via [facility]. EMS reports pt has been sick since Thursday with a cough. Also reports that pt's sats were less than 94% on 4l via n/c and pt felt warm to touch. On arrival pt is confused and not able to answer questions appropriately.  Information obtained by husband. States she has been weak and had a cough since Thursday. Husband also states that pt was confused both Saturday and tonight. He states that at 1am this morning pt became more confused than she has been with an increased general weakness..so he called EMS. On arrival sats 91% on RA. Pt. Placed on 2l via n/c with sats increasing to 99%  Pt's skin color is pale. Pt has a dry cough and general weakness noted. RT in triage to evaluate pt. Temp 102.2

## 2022-10-08 NOTE — ED Notes (Signed)
[facility] here for pt transport via stretcher

## 2022-10-08 NOTE — Progress Notes (Signed)
88 year old female with past medical history of chronic renal insufficiency, interstitial lung disease, hyperlipidemia, hypothyroidism, hypertension.  She lives at home with her husband.  Per husband she has been coughing and had intermittent fevers for the past 2 or 3 days.  Today she developed some confusion.  He called 911, her sats were 91% on room air.  In the ER she had a Tmax of 102.2.  Her urine is suggestive of a UTI.  COVID/flu/RSV negative/chest x-ray normal.  The hospitalist asked to admit

## 2022-10-09 ENCOUNTER — Observation Stay (HOSPITAL_COMMUNITY): Payer: Medicare (Managed Care)

## 2022-10-09 DIAGNOSIS — G9341 Metabolic encephalopathy: Secondary | ICD-10-CM | POA: Diagnosis not present

## 2022-10-09 DIAGNOSIS — N1832 Chronic kidney disease, stage 3b: Secondary | ICD-10-CM | POA: Diagnosis not present

## 2022-10-09 DIAGNOSIS — R4182 Altered mental status, unspecified: Secondary | ICD-10-CM | POA: Diagnosis not present

## 2022-10-09 DIAGNOSIS — N3000 Acute cystitis without hematuria: Secondary | ICD-10-CM | POA: Diagnosis not present

## 2022-10-09 DIAGNOSIS — N179 Acute kidney failure, unspecified: Secondary | ICD-10-CM | POA: Diagnosis not present

## 2022-10-09 DIAGNOSIS — E039 Hypothyroidism, unspecified: Secondary | ICD-10-CM | POA: Diagnosis not present

## 2022-10-09 LAB — CBC WITH DIFFERENTIAL/PLATELET
Abs Immature Granulocytes: 0.05 10*3/uL (ref 0.00–0.07)
Basophils Absolute: 0 10*3/uL (ref 0.0–0.1)
Basophils Relative: 0 %
Eosinophils Absolute: 0.2 10*3/uL (ref 0.0–0.5)
Eosinophils Relative: 2 %
HCT: 26 % — ABNORMAL LOW (ref 36.0–46.0)
Hemoglobin: 9 g/dL — ABNORMAL LOW (ref 12.0–15.0)
Immature Granulocytes: 1 %
Lymphocytes Relative: 16 %
Lymphs Abs: 1.6 10*3/uL (ref 0.7–4.0)
MCH: 34.2 pg — ABNORMAL HIGH (ref 26.0–34.0)
MCHC: 34.6 g/dL (ref 30.0–36.0)
MCV: 98.9 fL (ref 80.0–100.0)
Monocytes Absolute: 1.1 10*3/uL — ABNORMAL HIGH (ref 0.1–1.0)
Monocytes Relative: 11 %
Neutro Abs: 6.9 10*3/uL (ref 1.7–7.7)
Neutrophils Relative %: 70 %
Platelets: 113 10*3/uL — ABNORMAL LOW (ref 150–400)
RBC: 2.63 MIL/uL — ABNORMAL LOW (ref 3.87–5.11)
RDW: 12.5 % (ref 11.5–15.5)
WBC: 9.9 10*3/uL (ref 4.0–10.5)
nRBC: 0 % (ref 0.0–0.2)

## 2022-10-09 LAB — COMPREHENSIVE METABOLIC PANEL
ALT: 10 U/L (ref 0–44)
AST: 20 U/L (ref 15–41)
Albumin: 2.7 g/dL — ABNORMAL LOW (ref 3.5–5.0)
Alkaline Phosphatase: 48 U/L (ref 38–126)
Anion gap: 11 (ref 5–15)
BUN: 34 mg/dL — ABNORMAL HIGH (ref 8–23)
CO2: 20 mmol/L — ABNORMAL LOW (ref 22–32)
Calcium: 8.8 mg/dL — ABNORMAL LOW (ref 8.9–10.3)
Chloride: 109 mmol/L (ref 98–111)
Creatinine, Ser: 1.55 mg/dL — ABNORMAL HIGH (ref 0.44–1.00)
GFR, Estimated: 32 mL/min — ABNORMAL LOW (ref >60)
Glucose, Bld: 108 mg/dL — ABNORMAL HIGH (ref 70–99)
Potassium: 4.2 mmol/L (ref 3.5–5.1)
Sodium: 140 mmol/L (ref 135–145)
Total Bilirubin: 0.6 mg/dL (ref 0.3–1.2)
Total Protein: 5.3 g/dL — ABNORMAL LOW (ref 6.5–8.1)

## 2022-10-09 LAB — BLOOD GAS, VENOUS
Acid-base deficit: 4.4 mmol/L — ABNORMAL HIGH (ref 0.0–2.0)
Bicarbonate: 20.2 mmol/L (ref 20.0–28.0)
Drawn by: 4956
O2 Saturation: 95.9 %
Patient temperature: 37.3
pCO2, Ven: 35 mmHg — ABNORMAL LOW (ref 44–60)
pH, Ven: 7.37 (ref 7.25–7.43)
pO2, Ven: 73 mmHg — ABNORMAL HIGH (ref 32–45)

## 2022-10-09 LAB — CK: Total CK: 215 U/L (ref 38–234)

## 2022-10-09 LAB — TSH: TSH: 0.743 u[IU]/mL (ref 0.350–4.500)

## 2022-10-09 LAB — PHOSPHORUS: Phosphorus: 2.7 mg/dL (ref 2.5–4.6)

## 2022-10-09 LAB — PROCALCITONIN: Procalcitonin: 0.17 ng/mL

## 2022-10-09 LAB — BRAIN NATRIURETIC PEPTIDE: B Natriuretic Peptide: 718.5 pg/mL — ABNORMAL HIGH (ref 0.0–100.0)

## 2022-10-09 LAB — MAGNESIUM: Magnesium: 0.8 mg/dL — CL (ref 1.7–2.4)

## 2022-10-09 MED ORDER — CEFADROXIL 1 G PO TABS: 1.0000 g | ORAL_TABLET | Freq: Two times a day (BID) | ORAL | 0 refills | Status: AC

## 2022-10-09 MED ORDER — LACTATED RINGERS IV SOLN: INTRAVENOUS | Status: DC

## 2022-10-09 MED ORDER — MAGNESIUM SULFATE 4 GM/100ML IV SOLN
4.0000 g | Freq: Once | INTRAVENOUS | Status: AC
  Administered 2022-10-09: 4 g via INTRAVENOUS
  Filled 2022-10-09: qty 100

## 2022-10-09 NOTE — Plan of Care (Signed)

## 2022-10-09 NOTE — Care Management Obs Status (Cosign Needed)
[facility] NOTIFICATION   Patient Details  Name: Cindy [name] MRN: [id number] Date of Birth: 1932-01-16   Medicare Observation Status Notification Given:  Yes    Curlene Labrum, RN 10/09/2022, 3:04 PM

## 2022-10-09 NOTE — Plan of Care (Signed)
MD notified of critical magnesium of 0.8. Also asked if he wanted patient on telemetry with a magnesium this low. Orders received for 4g magnesium IV and no telemetry.

## 2022-10-09 NOTE — Hospital Course (Signed)
Ms. Bost is an 88 yo female with PMH CKD3b, ILD, COPD, HLD, hypothyroidism, HTN, chronic diastolic CHF who presented with altered mentation.  She was initially seen at [facility] and then transferred to [facility].  She underwent infectious workup.  She has a history of developing altered mentation with prior UTIs as well. UA was notable for moderate LE, positive nitrite, 11-20 WBC, many bacteria.  She was started on Rocephin and admitted for further monitoring.  Mentation slowly improved back to normal baseline. Urine culture grew E. coli with sensitivities pending at discharge.  She was transitioned to cefadroxil at discharge to complete course. She also had acute on chronic kidney failure; creatinine 1.8 on admission up from baseline around 1.4.  She was started on fluids with improvement in creatinine down to 1.55 at time of discharge. She was tolerating a diet well and able to ambulate around the room with no difficulties.  She was considered stable for discharging home.

## 2022-10-09 NOTE — Care Management CC44 (Cosign Needed)
Condition Code 44 Documentation Completed  Patient Details  Name: Cindy [name] MRN: [id number] Date of Birth: May 30, 1932   Condition Code 44 given:  Yes Patient signature on Condition Code 44 notice:  Yes Documentation of 2 MD's agreement:  Yes Code 44 added to claim:  Yes    Curlene Labrum, RN 10/09/2022, 3:04 PM

## 2022-10-09 NOTE — Discharge Summary (Signed)
Physician Discharge Summary   Cindy [name] VVO:[id number] DOB: 11/26/1931 DOA: 10/08/2022  PCP: Lawerance Cruel, MD  Admit date: 10/08/2022 Discharge date: 10/09/2022  Barriers to discharge: none   Admitted From: Home Disposition:  Home Discharging physician: Dwyane Dee, MD  Recommendations for Outpatient Follow-up:  Continue routine chronic management   Home Health:  Equipment/Devices:   Discharge Condition: stable CODE STATUS: Full Diet recommendation:  Diet Orders (From admission, onward)     Start     Ordered   10/09/22 0956  Diet regular Room service appropriate? No; Fluid consistency: Thin  Diet effective now       Question Answer Comment  Room service appropriate? No   Fluid consistency: Thin      10/09/22 0955   10/09/22 0000  Diet - low sodium heart healthy        10/09/22 1456            Hospital Course: Cindy [name] is an 88 yo female with PMH CKD3b, ILD, COPD, HLD, hypothyroidism, HTN, chronic diastolic CHF who presented with altered mentation.  She was initially seen at [facility] and then transferred to [facility].  She underwent infectious workup.  She has a history of developing altered mentation with prior UTIs as well. UA was notable for moderate LE, positive nitrite, 11-20 WBC, many bacteria.  She was started on Rocephin and admitted for further monitoring.  Mentation slowly improved back to normal baseline. Urine culture grew E. coli with sensitivities pending at discharge.  She was transitioned to cefadroxil at discharge to complete course. She also had acute on chronic kidney failure; creatinine 1.8 on admission up from baseline around 1.4.  She was started on fluids with improvement in creatinine down to 1.55 at time of discharge. She was tolerating a diet well and able to ambulate around the room with no difficulties.  She was considered stable for discharging home.  The patient's chronic medical conditions were treated accordingly per the patient's  home medication regimen except as noted.  On day of discharge, patient was felt deemed stable for discharge. Patient/family member advised to call PCP or come back to ER if needed.   Principal Diagnosis: Acute metabolic encephalopathy  Discharge Diagnoses: Active Hospital Problems   Diagnosis Date Noted   Acute metabolic encephalopathy 26/94/8546   Acute cystitis 10/08/2022   Dehydration 10/08/2022   Acute renal failure superimposed on stage 3b chronic kidney disease ([place]) 10/08/2022   Acute prerenal azotemia 10/08/2022   Fever 10/08/2022   COPD (chronic obstructive pulmonary disease) ([place]) 10/08/2022   Chronic diastolic CHF (congestive heart failure) ([place]) 10/08/2022   Severe sepsis ([place]) 12/12/2021   Essential hypertension 12/25/2018   Acquired hypothyroidism 01/06/2017   HLD (hyperlipidemia) 12/11/2013   Allergic rhinitis 09/16/2012   Interstitial lung disease ([place]) 05/08/2012   Chronic iron deficiency anemia     Resolved Hospital Problems  No resolved problems to display.     Discharge Instructions     Diet - low sodium heart healthy   Complete by: As directed    Increase activity slowly   Complete by: As directed       Allergies as of 10/09/2022       Reactions   Codeine Other (See Comments)   Headache from heavy doses of codeine only        Medication List     TAKE these medications    acetaminophen 325 MG tablet Commonly known as: TYLENOL Take 2 tablets (650 mg total) by mouth  every 6 (six) hours as needed for mild pain (or Fever >/= 101).   albuterol 108 (90 Base) MCG/ACT inhaler Commonly known as: VENTOLIN HFA Inhale 1 puff into the lungs every 4 (four) hours as needed for wheezing or shortness of breath.   aspirin 81 MG tablet Take 81 mg by mouth daily.   atorvastatin 80 MG tablet Commonly known as: LIPITOR Take 1 tablet (80 mg total) by mouth daily.   benzonatate 200 MG capsule Commonly known as: TESSALON Take 1 capsule by mouth twice  daily as needed for cough What changed:  how much to take how to take this when to take this additional instructions   Calcium Carbonate-Vitamin D 600-200 MG-UNIT Tabs Take 1 tablet by mouth 2 (two) times daily.   cefadroxil 1 g tablet Commonly known as: DURICEF Take 1 tablet (1 g total) by mouth 2 (two) times daily for 7 doses.   ferrous sulfate 325 (65 FE) MG tablet Take 325 mg by mouth daily with breakfast.   fexofenadine 180 MG tablet Commonly known as: ALLEGRA Take 180 mg by mouth daily.   ICAPS AREDS 2 PO Take 1 capsule by mouth in the morning and at bedtime.   ipratropium 0.06 % nasal spray Commonly known as: ATROVENT Place 2 sprays into both nostrils 3 (three) times daily.   isosorbide mononitrate 30 MG 24 hr tablet Commonly known as: IMDUR Take 90 mg by mouth every morning.   levothyroxine 175 MCG tablet Commonly known as: SYNTHROID Take 175 mcg by mouth every evening.   losartan 50 MG tablet Commonly known as: COZAAR Take 50 mg by mouth daily.   metoprolol succinate 50 MG 24 hr tablet Commonly known as: TOPROL-XL Take 50 mg by mouth daily.   montelukast 10 MG tablet Commonly known as: SINGULAIR Take 10 mg by mouth at bedtime.   NICOTINAMIDE PO Take 1 capsule by mouth 2 (two) times daily.   nitroGLYCERIN 0.4 MG SL tablet Commonly known as: NITROSTAT PLACE 1 TABLET UNDER THE TONGUE EVERY 5 MINUTES AS NEEDED FOR CHEST PAIN What changed: See the new instructions.   pantoprazole 40 MG tablet Commonly known as: PROTONIX Take 40 mg by mouth 2 (two) times daily.   spironolactone-hydrochlorothiazide 25-25 MG tablet Commonly known as: ALDACTAZIDE Take 1 tablet by mouth daily.   SYSTANE OP Apply 1-2 drops to eye at bedtime as needed (dry eyes).   TART CHERRY PO Take 1,000 mg by mouth 2 (two) times a week.   traMADol 50 MG tablet Commonly known as: ULTRAM Take 50 mg by mouth 2 (two) times daily.        Allergies  Allergen Reactions    Codeine Other (See Comments)    Headache from heavy doses of codeine only    Consultations:   Procedures:   Discharge Exam: BP (!) 127/109   Pulse 71   Temp 98.3 F (36.8 C) (Oral)   Resp 17   LMP  (LMP Unknown)   SpO2 95%  Physical Exam Constitutional:      General: She is not in acute distress.    Appearance: Normal appearance. She is not ill-appearing.  HENT:     Head: Normocephalic and atraumatic.     Mouth/Throat:     Mouth: Mucous membranes are moist.  Eyes:     Extraocular Movements: Extraocular movements intact.  Cardiovascular:     Rate and Rhythm: Normal rate and regular rhythm.  Pulmonary:     Effort: Pulmonary effort is normal.  Breath sounds: Normal breath sounds.  Abdominal:     General: Bowel sounds are normal. There is no distension.     Palpations: Abdomen is soft.     Tenderness: There is no abdominal tenderness.  Musculoskeletal:        General: Normal range of motion.     Cervical back: Normal range of motion and neck supple.  Skin:    General: Skin is warm and dry.  Neurological:     General: No focal deficit present.     Mental Status: She is alert.  Psychiatric:        Mood and Affect: Mood normal.        Behavior: Behavior normal.      The results of significant diagnostics from this hospitalization (including imaging, microbiology, ancillary and laboratory) are listed below for reference.   Microbiology: Recent Results (from the past 240 hour(s))  Resp panel by RT-PCR (RSV, Flu A&B, Covid) Anterior Nasal Swab     Status: None   Collection Time: 10/08/22  3:25 AM   Specimen: Anterior Nasal Swab  Result Value Ref Range Status   SARS Coronavirus 2 by RT PCR NEGATIVE NEGATIVE Final    Comment: (NOTE) SARS-CoV-2 target nucleic acids are NOT DETECTED.  The SARS-CoV-2 RNA is generally detectable in upper respiratory specimens during the acute phase of infection. The lowest concentration of SARS-CoV-2 viral copies this assay can  detect is 138 copies/mL. A negative result does not preclude SARS-Cov-2 infection and should not be used as the sole basis for treatment or other patient management decisions. A negative result may occur with  improper specimen collection/handling, submission of specimen other than nasopharyngeal swab, presence of viral mutation(s) within the areas targeted by this assay, and inadequate number of viral copies(<138 copies/mL). A negative result must be combined with clinical observations, patient history, and epidemiological information. The expected result is Negative.  Fact Sheet for Patients:  EntrepreneurPulse.com.au  Fact Sheet for Healthcare Providers:  IncredibleEmployment.be  This test is no t yet approved or cleared by the [place] FDA and  has been authorized for detection and/or diagnosis of SARS-CoV-2 by FDA under an Emergency Use Authorization (EUA). This EUA will remain  in effect (meaning this test can be used) for the duration of the COVID-19 declaration under Section 564(b)(1) of the Act, 21 U.S.C.section 360bbb-3(b)(1), unless the authorization is terminated  or revoked sooner.       Influenza A by PCR NEGATIVE NEGATIVE Final   Influenza B by PCR NEGATIVE NEGATIVE Final    Comment: (NOTE) The Xpert Xpress SARS-CoV-2/FLU/RSV plus assay is intended as an aid in the diagnosis of influenza from Nasopharyngeal swab specimens and should not be used as a sole basis for treatment. Nasal washings and aspirates are unacceptable for Xpert Xpress SARS-CoV-2/FLU/RSV testing.  Fact Sheet for Patients: EntrepreneurPulse.com.au  Fact Sheet for Healthcare Providers: IncredibleEmployment.be  This test is not yet approved or cleared by the [place] FDA and has been authorized for detection and/or diagnosis of SARS-CoV-2 by FDA under an Emergency Use Authorization (EUA). This EUA will remain in  effect (meaning this test can be used) for the duration of the COVID-19 declaration under Section 564(b)(1) of the Act, 21 U.S.C. section 360bbb-3(b)(1), unless the authorization is terminated or revoked.     Resp Syncytial Virus by PCR NEGATIVE NEGATIVE Final    Comment: (NOTE) Fact Sheet for Patients: EntrepreneurPulse.com.au  Fact Sheet for Healthcare Providers: IncredibleEmployment.be  This test is not yet approved or  cleared by the [place] and has been authorized for detection and/or diagnosis of SARS-CoV-2 by FDA under an Emergency Use Authorization (EUA). This EUA will remain in effect (meaning this test can be used) for the duration of the COVID-19 declaration under Section 564(b)(1) of the Act, 21 U.S.C. section 360bbb-3(b)(1), unless the authorization is terminated or revoked.  Performed at [facility], [address] [place], [place] [address]   Culture, blood (Routine x 2)     Status: None (Preliminary result)   Collection Time: 10/08/22  3:53 AM   Specimen: BLOOD  Result Value Ref Range Status   Specimen Description   Final    BLOOD Performed at [facility], [address] [place], [place] [address]    Special Requests   Final    NONE Performed at [facility], [address] [place], [place] [address]    Culture   Final    NO GROWTH < 24 HOURS Performed at [facility], [place] [address], [place], [place] [address]    Report Status PENDING  Incomplete  Culture, blood (Routine x 2)     Status: None (Preliminary result)   Collection Time: 10/08/22  3:58 AM   Specimen: BLOOD  Result Value Ref Range Status   Specimen Description   Final    BLOOD Performed at [facility], [address] [place], [place] [address]    Special Requests   Final    NONE Performed at [facility], [address],  [place], [place] [address]    Culture   Final    NO GROWTH < 24 HOURS Performed at [facility], [place] [address], [place], [place] [address]    Report Status PENDING  Incomplete  Urine Culture     Status: Abnormal (Preliminary result)   Collection Time: 10/08/22  7:07 AM   Specimen: Urine, Clean Catch  Result Value Ref Range Status   Specimen Description   Final    URINE, CLEAN CATCH Performed at [facility], [address] [place], [place] [address]    Special Requests   Final    NONE Performed at [facility], [place], [place] [address]    Culture (A)  Final    80,000 COLONIES/mL ESCHERICHIA COLI SUSCEPTIBILITIES TO FOLLOW Performed at [facility], [place] [address] [place], [place] [address]    Report Status PENDING  Incomplete     Labs: BNP (last 3 results) Recent Labs    10/09/22 0654  BNP 950.9*   Basic Metabolic Panel: Recent Labs  Lab 10/08/22 0353 10/09/22 0654  NA 138 140  K 4.5 4.2  CL 105 109  CO2 22 20*  GLUCOSE 162* 108*  BUN 45* 34*  CREATININE 1.80* 1.55*  CALCIUM 9.6 8.8*  MG  --  0.8*  PHOS  --  2.7   Liver Function Tests: Recent Labs  Lab 10/08/22 0353 10/09/22 0654  AST 16 20  ALT 6 10  ALKPHOS 49 48  BILITOT 0.6 0.6  PROT 7.0 5.3*  ALBUMIN 4.1 2.7*   No results for input(s): "LIPASE", "AMYLASE" in the last 168 hours. No results for input(s): "AMMONIA" in the last 168 hours. CBC: Recent Labs  Lab 10/08/22 0353 10/09/22 0654  WBC 10.6* 9.9  NEUTROABS 8.0* 6.9  HGB 10.4* 9.0*  HCT 30.8* 26.0*  MCV 99.7 98.9  PLT 123* 113*   Cardiac Enzymes: Recent Labs  Lab  10/09/22 0654  CKTOTAL 215   BNP: Invalid input(s): "POCBNP" CBG: Recent Labs  Lab 10/08/22 2330  GLUCAP 132*   D-Dimer No results for input(s): "DDIMER" in the last 72 hours. Hgb A1c No results for input(s): "HGBA1C" in the last 72 hours. Lipid Profile No results for input(s): "CHOL",  "HDL", "LDLCALC", "TRIG", "CHOLHDL", "LDLDIRECT" in the last 72 hours. Thyroid function studies Recent Labs    10/09/22 0654  TSH 0.743   Anemia work up No results for input(s): "VITAMINB12", "FOLATE", "FERRITIN", "TIBC", "IRON", "RETICCTPCT" in the last 72 hours. Urinalysis    Component Value Date/Time   COLORURINE YELLOW 10/08/2022 0336   APPEARANCEUR CLEAR 10/08/2022 0336   LABSPEC 1.015 10/08/2022 0336   PHURINE 5.0 10/08/2022 0336   GLUCOSEU NEGATIVE 10/08/2022 0336   HGBUR NEGATIVE 10/08/2022 0336   BILIRUBINUR NEGATIVE 10/08/2022 0336   KETONESUR NEGATIVE 10/08/2022 0336   PROTEINUR TRACE (A) 10/08/2022 0336   NITRITE POSITIVE (A) 10/08/2022 0336   LEUKOCYTESUR MODERATE (A) 10/08/2022 0336   Sepsis Labs Recent Labs  Lab 10/08/22 0353 10/09/22 0654  WBC 10.6* 9.9   Microbiology Recent Results (from the past 240 hour(s))  Resp panel by RT-PCR (RSV, Flu A&B, Covid) Anterior Nasal Swab     Status: None   Collection Time: 10/08/22  3:25 AM   Specimen: Anterior Nasal Swab  Result Value Ref Range Status   SARS Coronavirus 2 by RT PCR NEGATIVE NEGATIVE Final    Comment: (NOTE) SARS-CoV-2 target nucleic acids are NOT DETECTED.  The SARS-CoV-2 RNA is generally detectable in upper respiratory specimens during the acute phase of infection. The lowest concentration of SARS-CoV-2 viral copies this assay can detect is 138 copies/mL. A negative result does not preclude SARS-Cov-2 infection and should not be used as the sole basis for treatment or other patient management decisions. A negative result may occur with  improper specimen collection/handling, submission of specimen other than nasopharyngeal swab, presence of viral mutation(s) within the areas targeted by this assay, and inadequate number of viral copies(<138 copies/mL). A negative result must be combined with clinical observations, patient history, and epidemiological information. The expected result is  Negative.  Fact Sheet for Patients:  EntrepreneurPulse.com.au  Fact Sheet for Healthcare Providers:  IncredibleEmployment.be  This test is no t yet approved or cleared by the [place] FDA and  has been authorized for detection and/or diagnosis of SARS-CoV-2 by FDA under an Emergency Use Authorization (EUA). This EUA will remain  in effect (meaning this test can be used) for the duration of the COVID-19 declaration under Section 564(b)(1) of the Act, 21 U.S.C.section 360bbb-3(b)(1), unless the authorization is terminated  or revoked sooner.       Influenza A by PCR NEGATIVE NEGATIVE Final   Influenza B by PCR NEGATIVE NEGATIVE Final    Comment: (NOTE) The Xpert Xpress SARS-CoV-2/FLU/RSV plus assay is intended as an aid in the diagnosis of influenza from Nasopharyngeal swab specimens and should not be used as a sole basis for treatment. Nasal washings and aspirates are unacceptable for Xpert Xpress SARS-CoV-2/FLU/RSV testing.  Fact Sheet for Patients: EntrepreneurPulse.com.au  Fact Sheet for Healthcare Providers: IncredibleEmployment.be  This test is not yet approved or cleared by the [place] FDA and has been authorized for detection and/or diagnosis of SARS-CoV-2 by FDA under an Emergency Use Authorization (EUA). This EUA will remain in effect (meaning this test can be used) for the duration of the COVID-19 declaration under Section 564(b)(1) of the Act, 21 U.S.C. section 360bbb-3(b)(1),  unless the authorization is terminated or revoked.     Resp Syncytial Virus by PCR NEGATIVE NEGATIVE Final    Comment: (NOTE) Fact Sheet for Patients: EntrepreneurPulse.com.au  Fact Sheet for Healthcare Providers: IncredibleEmployment.be  This test is not yet approved or cleared by the [place] FDA and has been authorized for detection and/or diagnosis of  SARS-CoV-2 by FDA under an Emergency Use Authorization (EUA). This EUA will remain in effect (meaning this test can be used) for the duration of the COVID-19 declaration under Section 564(b)(1) of the Act, 21 U.S.C. section 360bbb-3(b)(1), unless the authorization is terminated or revoked.  Performed at [facility], [address] [place], [place] [address]   Culture, blood (Routine x 2)     Status: None (Preliminary result)   Collection Time: 10/08/22  3:53 AM   Specimen: BLOOD  Result Value Ref Range Status   Specimen Description   Final    BLOOD Performed at [facility], [address] [place], [place] [address]    Special Requests   Final    NONE Performed at [facility], [address], [place], [place] [address]    Culture   Final    NO GROWTH < 24 HOURS Performed at [facility], [place] [address] [place], [place] [address]    Report Status PENDING  Incomplete  Culture, blood (Routine x 2)     Status: None (Preliminary result)   Collection Time: 10/08/22  3:58 AM   Specimen: BLOOD  Result Value Ref Range Status   Specimen Description   Final    BLOOD Performed at [facility], [address] [place], [place] [address]    Special Requests   Final    NONE Performed at [facility], [address], [place], [place] [address]    Culture   Final    NO GROWTH < 24 HOURS Performed at [facility], [place] [address], [place], [place] [address]    Report Status PENDING  Incomplete  Urine Culture     Status: Abnormal (Preliminary result)   Collection Time: 10/08/22  7:07 AM   Specimen: Urine, Clean Catch  Result Value Ref Range Status   Specimen Description   Final    URINE, CLEAN CATCH Performed at [facility], [address], [place], [place] [address]    Special Requests   Final    NONE Performed at [facility],  [place], [place] [address]    Culture (A)  Final    80,000 COLONIES/mL ESCHERICHIA COLI SUSCEPTIBILITIES TO FOLLOW Performed at [facility], [place] [address] [place], [place] [address]    Report Status PENDING  Incomplete    Procedures/Studies: CT HEAD WO CONTRAST (5MM)  Result Date: 10/09/2022 CLINICAL DATA:  88 year old female with altered mental status. EXAM: CT HEAD WITHOUT CONTRAST TECHNIQUE: Contiguous axial images were obtained from the base of the skull through the vertex without intravenous contrast. RADIATION DOSE REDUCTION: This exam was performed according to the departmental dose-optimization program which includes automated exposure control, adjustment of the mA and/or kV according to patient size and/or use of iterative reconstruction technique. COMPARISON:  Head CT 03/28/2021, 03/13/2012. FINDINGS: Brain: Stable cerebral volume from last year. Small area of circumscribed chronic subcortical encephalomalacia at the perirolandic white matter, or perhaps more likely a dilated perivascular space (normal variant series 6, image 36 - and unchanged from a 2013 CT). No cortical encephalomalacia, and otherwise normal  for age gray-white differentiation. No midline shift, ventriculomegaly, mass effect, evidence of mass lesion, intracranial hemorrhage or evidence of cortically based acute infarction. Vascular: Calcified atherosclerosis at the skull base. No suspicious intracranial vascular hyperdensity. Skull: No acute osseous abnormality identified. Sinuses/Orbits: Visualized paranasal sinuses and mastoids are stable and well aerated. Other: No acute orbit or scalp soft tissue finding. IMPRESSION: No acute intracranial abnormality. Normal for age non contrast CT appearance of the brain. Electronically Signed   By: Genevie Ann M.D.   On: 10/09/2022 04:48   DG Chest Portable 1 View  Result Date: 10/08/2022 CLINICAL DATA:  Hypoxia, coughing and fever.  Altered mentation.  EXAM: PORTABLE CHEST 1 VIEW COMPARISON:  High-resolution chest CT 08/07/2022 FINDINGS: There is chronic linear scarring or atelectasis in the left base overlying the hemidiaphragm. There are mild chronic subpleural interstitial changes of the lungs. No focal pneumonia is evident. The cardiomediastinal silhouette and vascular pattern are normal. Calcification again noted in the transverse aorta and old median sternotomy sutures. There is osteopenia and degenerative change of the spine and shoulders. IMPRESSION: Chronic linear scarring or atelectasis in the left base. No evidence of acute cardiopulmonary disease. Electronically Signed   By: Telford Nab M.D.   On: 10/08/2022 05:00     Time coordinating discharge: Over 30 minutes    Dwyane Dee, MD  [facility] 10/09/2022, 5:58 PM

## 2022-10-09 NOTE — Progress Notes (Signed)
Nursing concern as during i her intake patient was alert and oriented x 3, participating.  Patient then went on to have an episode where it was hard to arouse and her,.  When she responded, she did not recall any of what she had been doing before.  Her comprehension was low.  She appeared weak.  Examination patient remains somewhat confused, she moves all extremities equally. Suspect this is patient's metabolic encephalopathy secondary to UTI continuing.  Will CT patient's head and consult PT.

## 2022-10-10 LAB — URINE CULTURE: Culture: 80000 — AB

## 2022-10-13 LAB — CULTURE, BLOOD (ROUTINE X 2)
Culture: NO GROWTH
Culture: NO GROWTH

## 2022-10-31 DIAGNOSIS — C441121 Basal cell carcinoma of skin of right upper eyelid, including canthus: Secondary | ICD-10-CM | POA: Diagnosis not present

## 2022-10-31 DIAGNOSIS — L578 Other skin changes due to chronic exposure to nonionizing radiation: Secondary | ICD-10-CM | POA: Diagnosis not present

## 2022-10-31 DIAGNOSIS — L570 Actinic keratosis: Secondary | ICD-10-CM | POA: Diagnosis not present

## 2022-10-31 DIAGNOSIS — C44722 Squamous cell carcinoma of skin of right lower limb, including hip: Secondary | ICD-10-CM | POA: Diagnosis not present

## 2022-10-31 DIAGNOSIS — L821 Other seborrheic keratosis: Secondary | ICD-10-CM | POA: Diagnosis not present

## 2022-12-12 DIAGNOSIS — I130 Hypertensive heart and chronic kidney disease with heart failure and stage 1 through stage 4 chronic kidney disease, or unspecified chronic kidney disease: Secondary | ICD-10-CM | POA: Diagnosis not present

## 2022-12-12 DIAGNOSIS — D696 Thrombocytopenia, unspecified: Secondary | ICD-10-CM | POA: Diagnosis not present

## 2022-12-12 DIAGNOSIS — I10 Essential (primary) hypertension: Secondary | ICD-10-CM | POA: Diagnosis not present

## 2022-12-12 DIAGNOSIS — G894 Chronic pain syndrome: Secondary | ICD-10-CM | POA: Diagnosis not present

## 2022-12-12 DIAGNOSIS — I700 Atherosclerosis of aorta: Secondary | ICD-10-CM | POA: Diagnosis not present

## 2022-12-12 DIAGNOSIS — Z0000 Encounter for general adult medical examination without abnormal findings: Secondary | ICD-10-CM | POA: Diagnosis not present

## 2022-12-12 DIAGNOSIS — N1832 Chronic kidney disease, stage 3b: Secondary | ICD-10-CM | POA: Diagnosis not present

## 2022-12-12 DIAGNOSIS — R2689 Other abnormalities of gait and mobility: Secondary | ICD-10-CM | POA: Diagnosis not present

## 2022-12-12 DIAGNOSIS — E039 Hypothyroidism, unspecified: Secondary | ICD-10-CM | POA: Diagnosis not present

## 2022-12-12 DIAGNOSIS — D509 Iron deficiency anemia, unspecified: Secondary | ICD-10-CM | POA: Diagnosis not present

## 2022-12-12 DIAGNOSIS — E785 Hyperlipidemia, unspecified: Secondary | ICD-10-CM | POA: Diagnosis not present

## 2022-12-12 DIAGNOSIS — J849 Interstitial pulmonary disease, unspecified: Secondary | ICD-10-CM | POA: Diagnosis not present

## 2022-12-12 DIAGNOSIS — I5032 Chronic diastolic (congestive) heart failure: Secondary | ICD-10-CM | POA: Diagnosis not present

## 2022-12-12 DIAGNOSIS — M19049 Primary osteoarthritis, unspecified hand: Secondary | ICD-10-CM | POA: Diagnosis not present

## 2022-12-14 DIAGNOSIS — M1990 Unspecified osteoarthritis, unspecified site: Secondary | ICD-10-CM | POA: Diagnosis not present

## 2022-12-14 DIAGNOSIS — M5430 Sciatica, unspecified side: Secondary | ICD-10-CM | POA: Diagnosis not present

## 2022-12-14 DIAGNOSIS — G894 Chronic pain syndrome: Secondary | ICD-10-CM | POA: Diagnosis not present

## 2023-01-01 ENCOUNTER — Encounter (HOSPITAL_BASED_OUTPATIENT_CLINIC_OR_DEPARTMENT_OTHER): Payer: Self-pay | Admitting: Pulmonary Disease

## 2023-01-01 ENCOUNTER — Ambulatory Visit (INDEPENDENT_AMBULATORY_CARE_PROVIDER_SITE_OTHER): Payer: Medicare (Managed Care) | Admitting: Pulmonary Disease

## 2023-01-01 VITALS — BP 118/64 | HR 64 | Temp 98.0°F | Ht 60.0 in | Wt 144.6 lb

## 2023-01-01 DIAGNOSIS — J849 Interstitial pulmonary disease, unspecified: Secondary | ICD-10-CM

## 2023-01-01 DIAGNOSIS — R053 Chronic cough: Secondary | ICD-10-CM | POA: Diagnosis not present

## 2023-01-01 NOTE — Assessment & Plan Note (Signed)
HRCT suggests probable UIP and in her age group this is likely IPF.  Course has been somewhat benign over the last 10 years with only mild progression.  Serology is negative except for positive ANA and she has no stigmata of collagen vascular disease.  I discussed possible diagnosis with her.  We discussed side effects of antibiotics and she would not tolerate and does not want to pursue we will give her a short course of prednisone for the cough since this has helped her in the past. Will repeat high-resolution CT chest in October for disease progression

## 2023-01-01 NOTE — Assessment & Plan Note (Signed)
Likely cause of ILD but other possibilities include upper airway cough syndrome since she does have constant nasal drainage. Trial of chlorpheniramine 4 mg at bedtime x 3 weeks + phenylephrine 10 mg daytime  (combination [facility] brand 'sinus PE')

## 2023-01-01 NOTE — Patient Instructions (Signed)
Trial of chlorpheniramine 4 mg at bedtime x 3 weeks + phenylephrine 10 mg daytime  (combination [facility] brand 'sinus PE')  X If this does not work, Rx for Prednisone 10 mg tabs  Take 2 tabs daily with food x 5ds, then 1 tab daily with food x 5ds then STOP   You have fibrosis in your lungs  X HRCT chest in october

## 2023-01-01 NOTE — Progress Notes (Signed)
   Subjective:    Patient ID: Cindy [name], female    DOB: September 07, 1932, 89 y.o.   MRN: [id number]  HPI  89 yo remote ex-smoker for FU of ILD/NSIP, chronic cough  & stable RLL pulmonary nodule. She presented in 2013 with persistent wheezing and cough    -likely related to ILD other possibilities include GERD and upper airway cough   PMH -CABG in 83, severe osteo-arthritis -Tessalon Perles have not helped. Tussionex caused her to be drowsy/ incoherent requiring ED visit .  She smoked 1.5 packs per day for 25 years before quitting in '81  Chief Complaint  Patient presents with   Follow-up    Pt states she has had a cough with mucus production again.    OV 07/2022 acute worsening of her chronic cough >> pred course helped. Accompanied by her husband today, reports that cough is persistent, she has learned to live with this.  She takes Mucinex which does not seem to help. Benzonatate Perles were not effective either. She uses albuterol as needed. Her nose runs constantly. She has no dyspnea or pedal edema. We reviewed CT chest  Significant tests/ events reviewed  07/2022 ANA 1:320, speckled 09/2020 ANA 1-3 20, CCP negative  HRCT Chest 07/2022 >> probable UIP, slightly worse compared to 2014  09/2020 HRCT  chest >> probable UIP , unchanged from 2019   HRCT chest August 2019 that showed fibrotic interstitial lung disease without frank honeycombing and without progression since 2015 ? NSIP   HRCT 03/2012 ILD pattern is nonspecific, and could represent either early usual interstitial pneumonia (UIP), or nonspecific interstitial pneumonia (NSIP). 19mm RLL nodule    CT chest 07/19/14 -mild subpleural fibroiss unchanged from 03/2012. Scattered pulmonary nodules are unchanged (considered benign with 2 yr serial follow up ) .    PFTs 03/2012 no airway obstruction, preserved lung volumes, isolated decrease in DLCO to 56% corrects for Va.   Spirometry 04/2013 - preserved lung function w/ no  airflow obstruction   Review of Systems neg for any significant sore throat, dysphagia, itching, sneezing, nasal congestion or excess/ purulent secretions, fever, chills, sweats, unintended wt loss, pleuritic or exertional cp, hempoptysis, orthopnea pnd or change in chronic leg swelling. Also denies presyncope, palpitations, heartburn, abdominal pain, nausea, vomiting, diarrhea or change in bowel or urinary habits, dysuria,hematuria, rash, arthralgias, visual complaints, headache, numbness weakness or ataxia.     Objective:   Physical Exam  Gen. Pleasant, obese, in no distress, normal affect ENT - no pallor,icterus, no post nasal drip, class 2-3 airway Neck: No JVD, no thyromegaly, no carotid bruits Lungs: no use of accessory muscles, no dullness to percussion, decreased without rales or rhonchi  Cardiovascular: Rhythm regular, heart sounds  normal, no murmurs or gallops, no peripheral edema Abdomen: soft and non-tender, no hepatosplenomegaly, BS normal. Musculoskeletal: No deformities, no cyanosis or clubbing Neuro:  alert, non focal, no tremors       Assessment & Plan:

## 2023-01-25 ENCOUNTER — Telehealth: Payer: Self-pay | Admitting: Pulmonary Disease

## 2023-01-25 NOTE — Telephone Encounter (Signed)
LOV 01/01/23. Pt states that she is out of her allergy medication that he prescribed. Wants RA to know that she her nose is running constantly, coughing and watery eyes. States no change since visit. Wants to know if there is anything else that she should do. Please advise.

## 2023-01-28 MED ORDER — FEXOFENADINE HCL 180 MG PO TABS: 180.0000 mg | ORAL_TABLET | Freq: Every day | ORAL | 5 refills | Status: AC

## 2023-01-28 NOTE — Telephone Encounter (Signed)
Allegra sent to pharmacy

## 2023-01-30 DIAGNOSIS — L821 Other seborrheic keratosis: Secondary | ICD-10-CM | POA: Diagnosis not present

## 2023-01-30 DIAGNOSIS — L570 Actinic keratosis: Secondary | ICD-10-CM | POA: Diagnosis not present

## 2023-01-30 DIAGNOSIS — L578 Other skin changes due to chronic exposure to nonionizing radiation: Secondary | ICD-10-CM | POA: Diagnosis not present

## 2023-02-06 DIAGNOSIS — M13849 Other specified arthritis, unspecified hand: Secondary | ICD-10-CM | POA: Diagnosis not present

## 2023-02-06 DIAGNOSIS — M79642 Pain in left hand: Secondary | ICD-10-CM | POA: Diagnosis not present

## 2023-02-06 DIAGNOSIS — M79641 Pain in right hand: Secondary | ICD-10-CM | POA: Diagnosis not present

## 2023-02-06 DIAGNOSIS — M13841 Other specified arthritis, right hand: Secondary | ICD-10-CM | POA: Diagnosis not present

## 2023-02-14 ENCOUNTER — Encounter: Payer: Self-pay | Admitting: Cardiology

## 2023-02-14 ENCOUNTER — Ambulatory Visit: Payer: Medicare (Managed Care) | Attending: Cardiology | Admitting: Cardiology

## 2023-02-14 VITALS — BP 150/60 | HR 56 | Ht 60.0 in | Wt 144.0 lb

## 2023-02-14 DIAGNOSIS — I25118 Atherosclerotic heart disease of native coronary artery with other forms of angina pectoris: Secondary | ICD-10-CM

## 2023-02-14 DIAGNOSIS — I10 Essential (primary) hypertension: Secondary | ICD-10-CM | POA: Diagnosis not present

## 2023-02-14 MED ORDER — METOPROLOL SUCCINATE ER 25 MG PO TB24: 25.0000 mg | ORAL_TABLET | Freq: Every day | ORAL | 3 refills | Status: AC

## 2023-02-14 NOTE — Progress Notes (Signed)
Cardiology Office Note:    Date:  02/14/2023   ID:  Cindy [name], DOB [birth year], MRN [id number]  PCP:  Cindy Floro, MD  Cardiologist:  Cindy Stevison, MD    Referring MD: Cindy Floro, MD     History of Present Illness:    Cindy [name] is a 89 y.o. female with a hx of hypertension, CAD, MI (89 years old), hypercholesterolemia, COPD, and GERD here today for hospital follow-up.  I take care of her husband Cindy [name].  Had first-degree AV block noted on ECG with heart rate of 56.  We decreased her Toprol.  Asymptomatic.  Cindy [name] is getting married in July.  They would like for them to do the shag dance.  Overall doing well.  No chest pain, no shortness of breath.  She presented to the ED 12/12/21 for acute respiratory failure. The day before, she complained of weakness and chills. Her temperature at home was 99.1 F and she was noted to be weak and disoriented. She also admitted to recent dysphagia. Upon admission, she was noted to be febrile up to 104.3 F with tachycardia and tachypnea and WBC 11.5. Her oxygen saturation was 87%. Chest CT showed right base consolidation posteriorly concerning for developing infection/inflammation. She was advised to follow-up with pulmonary and cardiology.   Today, she is accompanied by a her husband and doing well. Prior to her hospitalization, she was doing well. She suddenly experienced fever and weakness causing her to present to the ED. She is recovering well. However, she still has difficulty chewing and swallowing.   She endorses occasional chest pain starting before her hospitalization. The pain occurs randomly when she is sitting or walking around her home. She denies any palpitations, shortness of breath, lightheadedness, headaches, syncope, orthopnea, PND, lower extremity edema or exertional symptoms.  She is compliant and tolerating her medications. Her most strenuous activity is washing laundry. Dr. Tenny Craw recommends she receive a chest  X-ray.   Extensive review of hospital records, personally reviewed CT scan.  Did show some emphysematous changes as well.  Coronary artery calcification noted.  Lab work reviewed.   Past Medical History:  Diagnosis Date   Asthma    Bronchitis    COPD (chronic obstructive pulmonary disease)    Coronary artery disease    MI age 89   GERD (gastroesophageal reflux disease)    Heart attack    Hypercholesteremia    Hypertension    ILD (interstitial lung disease)    Iron deficiency anemia    used to see hem in [place], [place].    Renal insufficiency    Thyroid disease     Past Surgical History:  Procedure Laterality Date   APPENDECTOMY     BREAST BIOPSY Right    benign   CORONARY ANGIOPLASTY WITH STENT PLACEMENT     CORONARY ARTERY BYPASS GRAFT  09/1979   LEFT HEART CATH AND CORS/GRAFTS ANGIOGRAPHY N/A 11/27/2018   Procedure: LEFT HEART CATH AND CORS/GRAFTS ANGIOGRAPHY;  Surgeon: Runell Gess, MD;  Location: [facility];  Service: Cardiovascular;  Laterality: N/A;   Moh's Surgeries     for squamous cell carcinoma   PARTIAL HYSTERECTOMY  10/1978    Current Medications: Current Meds  Medication Sig   albuterol (VENTOLIN HFA) 108 (90 Base) MCG/ACT inhaler Inhale 1 puff into the lungs every 4 (four) hours as needed for wheezing or shortness of breath.   aspirin 81 MG tablet Take 81 mg by mouth daily.  atorvastatin (LIPITOR) 80 MG tablet Take 1 tablet (80 mg total) by mouth daily.   benzonatate (TESSALON) 200 MG capsule Take 1 capsule by mouth twice daily as needed for cough (Patient taking differently: Take 200 mg by mouth 2 (two) times daily.)   Biotin 5 MG CAPS Take by mouth daily at 6 (six) AM.   Calcium Carbonate-Vitamin D 600-200 MG-UNIT TABS Take 1 tablet by mouth 2 (two) times daily.   ferrous sulfate 325 (65 FE) MG tablet Take 325 mg by mouth daily with breakfast.   fexofenadine (ALLEGRA) 180 MG tablet Take 1 tablet (180 mg total) by mouth daily.   guaifenesin  (HUMIBID E) 400 MG TABS tablet Take 400 mg by mouth.   ipratropium (ATROVENT) 0.06 % nasal spray Place 2 sprays into both nostrils 3 (three) times daily.   isosorbide mononitrate (IMDUR) 30 MG 24 hr tablet Take 90 mg by mouth every morning.   levothyroxine (SYNTHROID, LEVOTHROID) 175 MCG tablet Take 175 mcg by mouth every evening.   losartan (COZAAR) 50 MG tablet Take 50 mg by mouth daily.   metoprolol succinate (TOPROL-XL) 25 MG 24 hr tablet Take 1 tablet (25 mg total) by mouth daily.   montelukast (SINGULAIR) 10 MG tablet Take 10 mg by mouth at bedtime.   Multiple Vitamins-Minerals (ICAPS AREDS 2 PO) Take 1 capsule by mouth in the morning and at bedtime.   Niacinamide-Zn-Cu-Methfo-Se-Cr (NICOTINAMIDE PO) Take 1 capsule by mouth 2 (two) times daily.   nitroGLYCERIN (NITROSTAT) 0.4 MG SL tablet PLACE 1 TABLET UNDER THE TONGUE EVERY 5 MINUTES AS NEEDED FOR CHEST PAIN (Patient taking differently: Place 0.4 mg under the tongue every 5 (five) minutes as needed for chest pain.)   pantoprazole (PROTONIX) 40 MG tablet Take 40 mg by mouth 2 (two) times daily.   spironolactone-hydrochlorothiazide (ALDACTAZIDE) 25-25 MG tablet Take 1 tablet by mouth daily.   TART CHERRY PO Take 1,000 mg by mouth 2 (two) times a week.   traMADol (ULTRAM) 50 MG tablet Take 50 mg by mouth 2 (two) times daily.   [DISCONTINUED] metoprolol (TOPROL-XL) 50 MG 24 hr tablet Take 50 mg by mouth daily.       Allergies:   Codeine   Social History   Socioeconomic History   Marital status: Married    Spouse name: Not on file   Number of children: 2   Years of education: Not on file   Highest education level: Not on file  Occupational History   Occupation: retired    Comment: retired  Tobacco Use   Smoking status: Former    Packs/day: 1.50    Years: 22.00    Additional pack years: 0.00    Total pack years: 33.00    Types: Cigarettes    Quit date: 10/23/1979    Years since quitting: 43.3   Smokeless tobacco: Never  Vaping  Use   Vaping Use: Never used  Substance and Sexual Activity   Alcohol use: Yes    Comment: occasional   Drug use: No   Sexual activity: Not on file  Other Topics Concern   Not on file  Social History Narrative   Not on file   Social Determinants of Health   Financial Resource Strain: Not on file  Food Insecurity: No Food Insecurity (10/08/2022)   Hunger Vital Sign    Worried About Running Out of Food in the Last Year: Never true    Ran Out of Food in the Last Year: Never true  Transportation Needs:  No Transportation Needs (10/08/2022)   PRAPARE - Administrator, Civil Service (Medical): No    Lack of Transportation (Non-Medical): No  Physical Activity: Not on file  Stress: Not on file  Social Connections: Not on file     Family History: The patient's family history includes Heart disease in her father and mother; Melanoma in her sister. There is no history of Breast cancer.  ROS:   Please see the history of present illness.    (+) Dysphagia (+) Chest pain All other systems reviewed and negative.   EKGs/Labs/Other Studies Reviewed:    The following studies were reviewed today: CT Chest 12/12/21 IMPRESSION: 1. Right base slightly heterogeneous hypodense consolidation posteriorly may represent developing infection/inflammation. 2. Small fat containing umbilical hernia with an abdominal defect of 7 mm and associated spat stranding of the associated mesentery. Correlate clinically for incarceration. No associated bowel obstruction. 3. No acute intra-abdominal or intrapelvic abnormality. 4. Aortic Atherosclerosis (ICD10-I70.0) including four-vessel coronary calcifications status post coronary artery bypass graft. 5.  Emphysema (ICD10-J43.9).  Carotid Duplex 01/23/19 Summary:  Right Carotid: Velocities in the right ICA are consistent with a 1-39% stenosis. The RICA velocities remain within normal range and are stable compared to the prior exam.   Left Carotid:  Velocities in the left ICA are consistent with a 40-59% stenosis, based on peak systolic velocities and plaque formation. The ECA appears >50% stenosed. The LICA velocities are elevated and have increased compared to the prior exam.   Vertebrals:  Bilateral vertebral arteries demonstrate antegrade flow.   Subclavians: Left subclavian artery was stenotic. Normal flow hemodynamics were seen in the right subclavian artery.   L Heart Cath 11/27/18 Dist RCA lesion is 100% stenosed. Ost RPDA to RPDA lesion is 100% stenosed. Post Atrio lesion is 100% stenosed. Prox RCA to Mid RCA lesion is 100% stenosed. Origin to Prox Graft lesion is 100% stenosed. Mid Graft to Insertion lesion is 100% stenosed. The left ventricular systolic function is normal. LV end diastolic pressure is normal. The left ventricular ejection fraction is 55-65% by visual estimate.   Wall Motion  Resting                Left Heart  Left Ventricle The left ventricular size is normal. The left ventricular systolic function is normal. LV end diastolic pressure is normal. The left ventricular ejection fraction is 55-65% by visual estimate. No regional wall motion abnormalities.   Coronary Diagrams  Diagnostic Dominance: Right IMPRESSION: Ms. Cherian has an occluded dominant RCA, occluded RCA vein graft with left-to-right collaterals and normal LV function.  Medical therapy will be recommended.  A minx closure device was successfully deployed.  The patient left lab in stable condition.  She will be gently hydrated and most likely discharge home tomorrow on antianginal medications.  Echo 11/27/18 1. The left ventricle has normal systolic function of 55-60%. The cavity size was normal. There is mildly increased left ventricular wall thickness. Echo evidence of impaired diastolic relaxation.   2. The right ventricle has normal systolic function. The cavity was normal. There is no increase in right ventricular wall thickness.   3.  Left atrial size was moderately dilated.   4. The mitral valve is normal in structure. There is mild calcification. No evidence of mitral valve stenosis. Mild mitral regurgitation.   5. The tricuspid valve is normal in structure.   6. The aortic valve is tricuspid There is mild calcification of the aortic valve. No aortic stenosis.  7. No evidence of left ventricular regional wall motion abnormalities.   8. The interatrial septum was not well visualized.   9. PA systolic pressure 32 mmHg.  10. The inferior vena cava is normal in size with greater than 50% respiratory variability   EKG: Sinus bradycardia first-degree AV block 56 bpm left bundle branch block  Recent Labs: 10/09/2022: ALT 10; B Natriuretic Peptide 718.5; BUN 34; Creatinine, Ser 1.55; Hemoglobin 9.0; Magnesium 0.8; Platelets 113; Potassium 4.2; Sodium 140; TSH 0.743   Recent Lipid Panel    Component Value Date/Time   CHOL 164 04/14/2019 1055   TRIG 191 (H) 04/14/2019 1055   HDL 43 04/14/2019 1055   CHOLHDL 3.8 04/14/2019 1055   LDLCALC 83 04/14/2019 1055    CHA2DS2-VASc Score =   [ ] .  Therefore, the patient's annual risk of stroke is   %.        Physical Exam:    VS:  BP (!) 150/60   Pulse (!) 56   Ht 5' (1.524 m)   Wt 144 lb (65.3 kg)   LMP  (LMP Unknown)   SpO2 95%   BMI 28.12 kg/m     Wt Readings from Last 3 Encounters:  02/14/23 144 lb (65.3 kg)  01/01/23 144 lb 9.6 oz (65.6 kg)  07/31/22 143 lb 3.2 oz (65 kg)     GEN: Well nourished, well developed in no acute distress HEENT: Normal NECK: No JVD; No carotid bruits LYMPHATICS: No lymphadenopathy CARDIAC: RRR, no murmurs, rubs, gallops RESPIRATORY:  Bilateral mild wheezing at lower lobes without rales or rhonchi  ABDOMEN: Soft, non-tender, non-distended MUSCULOSKELETAL:  No edema; No deformity  SKIN: Warm and dry NEUROLOGIC:  Alert and oriented x 3 PSYCHIATRIC:  Normal affect   ASSESSMENT:    1. Atherosclerosis of native coronary artery of  native heart with other form of angina pectoris   2. Essential hypertension     PLAN:     Coronary atherosclerosis of native coronary artery Prior CABG 1981.  Graft RCA is down.  She does have collaterals however.  Catheterization personally reviewed.  Continue with goal-directed medical therapy including aspirin 81 mg atorvastatin 80 mg.  She is also on metoprolol XL 50 mg a day and isosorbide 90 mg a day for anginal support.  Last LDL was 76.  Essential (primary) hypertension Currently well controlled.  Continue with spironolactone/HCTZ as well.  Dr. Tenny Craw has prescribed.  Acute respiratory failure with hypoxia secondary to sepsis due to pneumonia [facility]) Recent hospitalization with pneumonia.  Right lung.  Improving.  She is doing pulmonary rehab.  Sinus bradycardia first-degree AV block - Decreasing Toprol to 25 mg once a day.  PR interval is 216 ms.  Offered her follow-up with APP however they would rather send me.  Explained the rationale behind team care.   1 yr     Medication Adjustments/Labs and Tests Ordered: Current medicines are reviewed at length with the patient today.  Concerns regarding medicines are outlined above.  Orders Placed This Encounter  Procedures   EKG 12-Lead   Meds ordered this encounter  Medications   metoprolol succinate (TOPROL-XL) 25 MG 24 hr tablet    Sig: Take 1 tablet (25 mg total) by mouth daily.    Dispense:  90 tablet    Refill:  3   I,Mykaella Javier,acting as a scribe for [employer], MD.,have documented all relevant documentation on the behalf of Cindy Massie, MD,as directed by  Cindy Holloran, MD while in the  presence of Cindy Huertas, MD.  I, Cindy Dicesare, MD, have reviewed all documentation for this visit. The documentation on 02/14/23 for the exam, diagnosis, procedures, and orders are all accurate and complete.   Signed, Cindy Goya, MD  02/14/2023 12:26 PM    [facility]

## 2023-02-14 NOTE — Patient Instructions (Signed)
Medication Instructions:  Please decrease your Metoprolol succinate to 25 mg a day. Continue all other medications as listed.  *If you need a refill on your cardiac medications before your next appointment, please call your pharmacy*   Follow-Up: At [facility], you and your health needs are our priority.  As part of our continuing mission to provide you with exceptional heart care, we have created designated Provider Care Teams.  These Care Teams include your primary Cardiologist (physician) and Advanced Practice Providers (APPs -  Physician Assistants and Nurse Practitioners) who all work together to provide you with the care you need, when you need it.  We recommend signing up for the patient portal called "MyChart".  Sign up information is provided on this After Visit Summary.  MyChart is used to connect with patients for Virtual Visits (Telemedicine).  Patients are able to view lab/test results, encounter notes, upcoming appointments, etc.  Non-urgent messages can be sent to your provider as well.   To learn more about what you can do with MyChart, go to [email or web address].    Your next appointment:   1 year(s)  Provider:   Donato Gailey, MD

## 2023-03-11 ENCOUNTER — Emergency Department (HOSPITAL_BASED_OUTPATIENT_CLINIC_OR_DEPARTMENT_OTHER): Payer: Medicare (Managed Care)

## 2023-03-11 ENCOUNTER — Emergency Department (HOSPITAL_BASED_OUTPATIENT_CLINIC_OR_DEPARTMENT_OTHER): Payer: Medicare (Managed Care) | Admitting: Radiology

## 2023-03-11 ENCOUNTER — Other Ambulatory Visit: Payer: Self-pay

## 2023-03-11 ENCOUNTER — Encounter (HOSPITAL_BASED_OUTPATIENT_CLINIC_OR_DEPARTMENT_OTHER): Payer: Self-pay

## 2023-03-11 ENCOUNTER — Inpatient Hospital Stay (HOSPITAL_BASED_OUTPATIENT_CLINIC_OR_DEPARTMENT_OTHER)
Admission: EM | Admit: 2023-03-11 | Discharge: 2023-03-14 | DRG: 190 | Disposition: A | Discharge status: Home Health | Payer: Medicare (Managed Care) | Attending: Internal Medicine | Admitting: Internal Medicine

## 2023-03-11 DIAGNOSIS — I25810 Atherosclerosis of coronary artery bypass graft(s) without angina pectoris: Secondary | ICD-10-CM | POA: Diagnosis present

## 2023-03-11 DIAGNOSIS — R410 Disorientation, unspecified: Secondary | ICD-10-CM | POA: Diagnosis not present

## 2023-03-11 DIAGNOSIS — Z79890 Hormone replacement therapy (postmenopausal): Secondary | ICD-10-CM

## 2023-03-11 DIAGNOSIS — R109 Unspecified abdominal pain: Secondary | ICD-10-CM | POA: Diagnosis not present

## 2023-03-11 DIAGNOSIS — N3000 Acute cystitis without hematuria: Secondary | ICD-10-CM | POA: Diagnosis present

## 2023-03-11 DIAGNOSIS — D509 Iron deficiency anemia, unspecified: Secondary | ICD-10-CM | POA: Diagnosis present

## 2023-03-11 DIAGNOSIS — Z7982 Long term (current) use of aspirin: Secondary | ICD-10-CM

## 2023-03-11 DIAGNOSIS — I252 Old myocardial infarction: Secondary | ICD-10-CM

## 2023-03-11 DIAGNOSIS — I5A Non-ischemic myocardial injury (non-traumatic): Secondary | ICD-10-CM | POA: Diagnosis not present

## 2023-03-11 DIAGNOSIS — R7989 Other specified abnormal findings of blood chemistry: Secondary | ICD-10-CM | POA: Diagnosis not present

## 2023-03-11 DIAGNOSIS — E860 Dehydration: Secondary | ICD-10-CM | POA: Diagnosis present

## 2023-03-11 DIAGNOSIS — I447 Left bundle-branch block, unspecified: Secondary | ICD-10-CM | POA: Diagnosis present

## 2023-03-11 DIAGNOSIS — Z885 Allergy status to narcotic agent status: Secondary | ICD-10-CM

## 2023-03-11 DIAGNOSIS — I340 Nonrheumatic mitral (valve) insufficiency: Secondary | ICD-10-CM | POA: Diagnosis present

## 2023-03-11 DIAGNOSIS — N390 Urinary tract infection, site not specified: Secondary | ICD-10-CM | POA: Diagnosis not present

## 2023-03-11 DIAGNOSIS — K219 Gastro-esophageal reflux disease without esophagitis: Secondary | ICD-10-CM | POA: Diagnosis present

## 2023-03-11 DIAGNOSIS — J4489 Other specified chronic obstructive pulmonary disease: Secondary | ICD-10-CM | POA: Diagnosis present

## 2023-03-11 DIAGNOSIS — I5032 Chronic diastolic (congestive) heart failure: Secondary | ICD-10-CM | POA: Diagnosis present

## 2023-03-11 DIAGNOSIS — J690 Pneumonitis due to inhalation of food and vomit: Secondary | ICD-10-CM

## 2023-03-11 DIAGNOSIS — I2489 Other forms of acute ischemic heart disease: Secondary | ICD-10-CM | POA: Diagnosis present

## 2023-03-11 DIAGNOSIS — N184 Chronic kidney disease, stage 4 (severe): Secondary | ICD-10-CM | POA: Diagnosis present

## 2023-03-11 DIAGNOSIS — Z87440 Personal history of urinary (tract) infections: Secondary | ICD-10-CM

## 2023-03-11 DIAGNOSIS — N1832 Chronic kidney disease, stage 3b: Secondary | ICD-10-CM | POA: Diagnosis not present

## 2023-03-11 DIAGNOSIS — Z90711 Acquired absence of uterus with remaining cervical stump: Secondary | ICD-10-CM

## 2023-03-11 DIAGNOSIS — G9341 Metabolic encephalopathy: Secondary | ICD-10-CM | POA: Diagnosis present

## 2023-03-11 DIAGNOSIS — I25118 Atherosclerotic heart disease of native coronary artery with other forms of angina pectoris: Secondary | ICD-10-CM | POA: Diagnosis not present

## 2023-03-11 DIAGNOSIS — R079 Chest pain, unspecified: Secondary | ICD-10-CM | POA: Diagnosis present

## 2023-03-11 DIAGNOSIS — I2510 Atherosclerotic heart disease of native coronary artery without angina pectoris: Secondary | ICD-10-CM | POA: Diagnosis present

## 2023-03-11 DIAGNOSIS — I272 Pulmonary hypertension, unspecified: Secondary | ICD-10-CM | POA: Diagnosis present

## 2023-03-11 DIAGNOSIS — Z8249 Family history of ischemic heart disease and other diseases of the circulatory system: Secondary | ICD-10-CM

## 2023-03-11 DIAGNOSIS — J189 Pneumonia, unspecified organism: Secondary | ICD-10-CM | POA: Diagnosis not present

## 2023-03-11 DIAGNOSIS — Z1152 Encounter for screening for COVID-19: Secondary | ICD-10-CM

## 2023-03-11 DIAGNOSIS — E7800 Pure hypercholesterolemia, unspecified: Secondary | ICD-10-CM | POA: Diagnosis present

## 2023-03-11 DIAGNOSIS — J471 Bronchiectasis with (acute) exacerbation: Principal | ICD-10-CM | POA: Diagnosis present

## 2023-03-11 DIAGNOSIS — E039 Hypothyroidism, unspecified: Secondary | ICD-10-CM | POA: Diagnosis present

## 2023-03-11 DIAGNOSIS — R197 Diarrhea, unspecified: Secondary | ICD-10-CM | POA: Diagnosis present

## 2023-03-11 DIAGNOSIS — J849 Interstitial pulmonary disease, unspecified: Secondary | ICD-10-CM | POA: Diagnosis present

## 2023-03-11 DIAGNOSIS — Z808 Family history of malignant neoplasm of other organs or systems: Secondary | ICD-10-CM

## 2023-03-11 DIAGNOSIS — Z79899 Other long term (current) drug therapy: Secondary | ICD-10-CM

## 2023-03-11 DIAGNOSIS — R4181 Age-related cognitive decline: Secondary | ICD-10-CM | POA: Diagnosis present

## 2023-03-11 DIAGNOSIS — I130 Hypertensive heart and chronic kidney disease with heart failure and stage 1 through stage 4 chronic kidney disease, or unspecified chronic kidney disease: Secondary | ICD-10-CM | POA: Diagnosis present

## 2023-03-11 DIAGNOSIS — Z955 Presence of coronary angioplasty implant and graft: Secondary | ICD-10-CM

## 2023-03-11 DIAGNOSIS — Z87891 Personal history of nicotine dependence: Secondary | ICD-10-CM

## 2023-03-11 LAB — BRAIN NATRIURETIC PEPTIDE: B Natriuretic Peptide: 504.3 pg/mL — ABNORMAL HIGH (ref 0.0–100.0)

## 2023-03-11 LAB — COMPREHENSIVE METABOLIC PANEL
ALT: 8 U/L (ref 0–44)
AST: 22 U/L (ref 15–41)
Albumin: 4.7 g/dL (ref 3.5–5.0)
Alkaline Phosphatase: 50 U/L (ref 38–126)
Anion gap: 13 (ref 5–15)
BUN: 35 mg/dL — ABNORMAL HIGH (ref 8–23)
CO2: 22 mmol/L (ref 22–32)
Calcium: 10.4 mg/dL — ABNORMAL HIGH (ref 8.9–10.3)
Chloride: 103 mmol/L (ref 98–111)
Creatinine, Ser: 1.39 mg/dL — ABNORMAL HIGH (ref 0.44–1.00)
GFR, Estimated: 36 mL/min — ABNORMAL LOW (ref >60)
Glucose, Bld: 147 mg/dL — ABNORMAL HIGH (ref 70–99)
Potassium: 4.2 mmol/L (ref 3.5–5.1)
Sodium: 138 mmol/L (ref 135–145)
Total Bilirubin: 0.7 mg/dL (ref 0.3–1.2)
Total Protein: 7.4 g/dL (ref 6.5–8.1)

## 2023-03-11 LAB — URINALYSIS, ROUTINE W REFLEX MICROSCOPIC
Bilirubin Urine: NEGATIVE
Glucose, UA: NEGATIVE mg/dL
Hgb urine dipstick: NEGATIVE
Ketones, ur: NEGATIVE mg/dL
Nitrite: POSITIVE — AB
Specific Gravity, Urine: 1.014 (ref 1.005–1.030)
pH: 5.5 (ref 5.0–8.0)

## 2023-03-11 LAB — CBC
HCT: 35 % — ABNORMAL LOW (ref 36.0–46.0)
Hemoglobin: 12 g/dL (ref 12.0–15.0)
MCH: 33.4 pg (ref 26.0–34.0)
MCHC: 34.3 g/dL (ref 30.0–36.0)
MCV: 97.5 fL (ref 80.0–100.0)
Platelets: 143 10*3/uL — ABNORMAL LOW (ref 150–400)
RBC: 3.59 MIL/uL — ABNORMAL LOW (ref 3.87–5.11)
RDW: 12.7 % (ref 11.5–15.5)
WBC: 10.2 10*3/uL (ref 4.0–10.5)
nRBC: 0 % (ref 0.0–0.2)

## 2023-03-11 LAB — TROPONIN I (HIGH SENSITIVITY)
Troponin I (High Sensitivity): 387 ng/L (ref <18)
Troponin I (High Sensitivity): 365 ng/L (ref <18)

## 2023-03-11 LAB — RESP PANEL BY RT-PCR (RSV, FLU A&B, COVID)  RVPGX2
Influenza A by PCR: NEGATIVE
Influenza B by PCR: NEGATIVE
Resp Syncytial Virus by PCR: NEGATIVE
SARS Coronavirus 2 by RT PCR: NEGATIVE

## 2023-03-11 LAB — LIPASE, BLOOD: Lipase: 25 U/L (ref 11–51)

## 2023-03-11 MED ORDER — ASPIRIN 81 MG PO CHEW: 324.0000 mg | CHEWABLE_TABLET | Freq: Once | ORAL | Status: DC

## 2023-03-11 MED ORDER — SODIUM CHLORIDE 0.9 % IV SOLN: 1.0000 g | Freq: Once | INTRAVENOUS | Status: DC

## 2023-03-11 MED ORDER — IOHEXOL 350 MG/ML SOLN
100.0000 mL | Freq: Once | INTRAVENOUS | Status: AC | PRN
  Administered 2023-03-11: 70 mL via INTRAVENOUS

## 2023-03-11 MED ORDER — ASPIRIN 81 MG PO CHEW
243.0000 mg | CHEWABLE_TABLET | Freq: Once | ORAL | Status: AC
  Administered 2023-03-11: 243 mg via ORAL
  Filled 2023-03-11: qty 3

## 2023-03-11 MED ORDER — SODIUM CHLORIDE 0.9 % IV SOLN
3.0000 g | Freq: Once | INTRAVENOUS | Status: AC
  Administered 2023-03-11: 3 g via INTRAVENOUS

## 2023-03-11 MED ORDER — IPRATROPIUM-ALBUTEROL 0.5-2.5 (3) MG/3ML IN SOLN: 3.0000 mL | Freq: Once | RESPIRATORY_TRACT | Status: DC | PRN

## 2023-03-11 NOTE — Progress Notes (Signed)
Hospitalist Transfer Note:  Transferring facility: [facility] Requesting provider: Dr. Elpidio Anis (EDP at [facility]) Reason for transfer: admission for further evaluation and management of acute metabolic encephalopathy complicated by urinary tract infection, suspected pneumonia, mildly elevated troponin (now trending down), and recent episode of chest pain.    89  year old female with medical history notable for COPD, CAD status post CABG, who presented to [facility] ED with her family who was concerned regarding the patient's confusion/lethargy relative to baseline mental status over the last 2 to 3 days.  They have reportedly noted prior episodes of confusion/lethargy at times of prior infectious processes, including prior urinary tract infections.  The patient also complains of new onset urinary frequency over the last few days, which has been her presenting symptom at times of previous urinary tract infection diagnoses, and also has a new cough.  On the evening of 03/10/2023, while ambulating to the bathroom, the patient experienced a single episode of left-sided chest pain radiating into her left arm and associated with diaphoresis.  This episode of chest pain resolved spontaneously without any interval nitroglycerin, and the patient denies any ensuing episodes of chest pain, and confirms that she has been chest pain-free throughout her time in [facility] today.  Vital signs in the ED were notable for the following: Heart rate in the low 100s; BP 148/77; oxygen saturation 94% on room air.  Afebrile.  Labs were notable for UA, that was reportedly consistent with urinary tract infection.  Initial troponin was 387, with repeat value trending down to 365.  EKG reportedly showed a known left bundle branch block, sinus rhythm, without any overt evidence of acute ischemic changes.  Imaging notable for chest x-ray, which reportedly was suggestive of pneumonia, potentially in a pattern of aspiration.  [facility] EDP d/w  on-call cards (Dr. Hyacinth Meeker), who conveyed that cards will formally consult and recommended against heparin drip, as trop is trending down and the patient has not experienced any additional chest pain.   Medications administered prior to transfer included the following: Unasyn, which was ordered for coverage of UTI and pneumonia, including for distribution of radiographic findings that could be suggestive of aspiration.   Subsequently, I accepted this patient for transfer for inpatient admission to a cardiac-tele bed at [facility] for further work-up and management of the above.        Nursing staff, Please call [facility] Admits & Consults System-Wide number on Amion [phone]) as soon as patient's arrival, so appropriate admitting provider can evaluate the pt.     Cindy Pigg, DO Hospitalist

## 2023-03-11 NOTE — ED Triage Notes (Signed)
Patient here POV from Home with Family.  Endorses CP and Diarrhea that began Last PM and through today. Cough noted as well as some Mild Confusion (takes time to complete certain activities.    NAD Noted during Triage. A&Ox4. GCS 15. BIB Wheelchair.

## 2023-03-11 NOTE — ED Notes (Signed)
Pt had extravasation of approx 70cc contrast during bolus injection for a CTA dissection protocol. Dr. Elpidio Anis made aware of this. Since the pt's GFR is low, the decision was made to continue exam wo contrast & CTA changed to CT chest, abd, pel wo contrast. This will be documented in a safety zone portal.

## 2023-03-11 NOTE — ED Provider Notes (Signed)
[place] EMERGENCY DEPARTMENT AT [facility] Provider Note   CSN: [id number] Arrival date & time: 03/11/23  1851     History  Chief Complaint  Patient presents with   Chest Pain    KHRISTEN GARIBALDO is a 89 y.o. female. With pmh CKD3b, ILD, COPD, HLD, hypothyroidism, HTN, chronic diastolic CHF, CAD status post CABG and PCI on aspirin who presents after episode of chest pain that has since resolved as well as increasing mild confusion.  Patient has been back-and-forth to the hospital visiting her husband who is currently in [address] for hip fracture.  She has had some increased stressors in her life for this reason.  When she was going to the bathroom last night which she has been often especially when she develops a UTI she came back to her bedside chair and developed intense pressure-like pain in her substernal and left chest rating down her left arm and into her back.  It was ongoing for approximately 2 to 3 hours.  Eventually was relieved after 3 doses of sublingual nitroglycerin.  She had some sweating with this episode.  No vomiting.  Family also notes some increasing mild confusion difficulty completing normal daily tasks when she is typically as sharp as a whip.  This happens often when she gets a UTI.  She has had no severe headaches.  She has had some intermittent coughing.  She has had no fevers or vomiting.  No focal weakness numbness or tingling.   Chest Pain      Home Medications Prior to Admission medications   Medication Sig Start Date End Date Taking? Authorizing Provider  albuterol (VENTOLIN HFA) 108 (90 Base) MCG/ACT inhaler Inhale 1 puff into the lungs every 4 (four) hours as needed for wheezing or shortness of breath. 11/17/21   [provider]  aspirin 81 MG tablet Take 81 mg by mouth daily.     [provider]  atorvastatin (LIPITOR) 80 MG tablet Take 1 tablet (80 mg total) by mouth daily. 12/25/18   Berton Bon, NP  benzonatate (TESSALON)  200 MG capsule Take 1 capsule by mouth twice daily as needed for cough Patient taking differently: Take 200 mg by mouth 2 (two) times daily. 09/23/20   Oretha Milch, MD  Biotin 5 MG CAPS Take by mouth daily at 6 (six) AM.    [provider]  Calcium Carbonate-Vitamin D 600-200 MG-UNIT TABS Take 1 tablet by mouth 2 (two) times daily.    [provider]  ferrous sulfate 325 (65 FE) MG tablet Take 325 mg by mouth daily with breakfast.    [provider]  fexofenadine (ALLEGRA) 180 MG tablet Take 1 tablet (180 mg total) by mouth daily. 01/28/23   Oretha Milch, MD  guaifenesin (HUMIBID E) 400 MG TABS tablet Take 400 mg by mouth. 12/27/22   [provider]  ipratropium (ATROVENT) 0.06 % nasal spray Place 2 sprays into both nostrils 3 (three) times daily.    [provider]  isosorbide mononitrate (IMDUR) 30 MG 24 hr tablet Take 90 mg by mouth every morning. 11/13/21   [provider]  levothyroxine (SYNTHROID, LEVOTHROID) 175 MCG tablet Take 175 mcg by mouth every evening.    [provider]  losartan (COZAAR) 50 MG tablet Take 50 mg by mouth daily.    [provider]  metoprolol succinate (TOPROL-XL) 25 MG 24 hr tablet Take 1 tablet (25 mg total) by mouth daily. 02/14/23   Donato Harter  C, MD  montelukast (SINGULAIR) 10 MG tablet Take 10 mg by mouth at bedtime. 08/31/20   [provider]  Multiple Vitamins-Minerals (ICAPS AREDS 2 PO) Take 1 capsule by mouth in the morning and at bedtime.    [provider]  Niacinamide-Zn-Cu-Methfo-Se-Cr (NICOTINAMIDE PO) Take 1 capsule by mouth 2 (two) times daily.    [provider]  nitroGLYCERIN (NITROSTAT) 0.4 MG SL tablet PLACE 1 TABLET UNDER THE TONGUE EVERY 5 MINUTES AS NEEDED FOR CHEST PAIN Patient taking differently: Place 0.4 mg under the tongue every 5 (five) minutes as needed for chest pain. 09/11/19   Jake Bathe, MD  pantoprazole (PROTONIX) 40 MG tablet Take  40 mg by mouth 2 (two) times daily.    [provider]  spironolactone-hydrochlorothiazide (ALDACTAZIDE) 25-25 MG tablet Take 1 tablet by mouth daily.    [provider]  TART CHERRY PO Take 1,000 mg by mouth 2 (two) times a week.    [provider]  traMADol (ULTRAM) 50 MG tablet Take 50 mg by mouth 2 (two) times daily.    [provider]      Allergies    Codeine    Review of Systems   Review of Systems  Cardiovascular:  Positive for chest pain.    Physical Exam Updated Vital Signs BP (!) 148/77 (BP Location: Right Arm)   Pulse (!) 103   Temp 98.3 F (36.8 C) (Oral)   Resp 18   Ht 5' (1.524 m)   Wt 65.3 kg   LMP  (LMP Unknown)   SpO2 94%   BMI 28.12 kg/m  Physical Exam Constitutional: Alert and orientedx3, slightly fatigued in appearance but NAD Eyes: Conjunctivae are normal. ENT      Head: Normocephalic and atraumatic. Cardiovascular: S1, S2,  Normal and symmetric distal pulses are present in all extremities.Warm and well perfused. Respiratory: Normal respiratory effort.  Gastrointestinal: Soft and mid abdominal and suprapubic ttp, no rebound or guarding, no cvat Musculoskeletal: Normal range of motion in all extremities. Trace equal pitting edema b/l LE Neurologic: Normal speech and language.  Moving all 4 extremities equally.  Sensation grossly intact.  No facial droop.  No gross focal neurologic deficits are appreciated. Skin: Skin is warm, dry and intact. No rash noted. Psychiatric: Mood and affect are normal. Speech and behavior are normal.  ED Results / Procedures / Treatments   Labs (all labs ordered are listed, but only abnormal results are displayed) Labs Reviewed  CBC - Abnormal; Notable for the following components:      Result Value   RBC 3.59 (*)    HCT 35.0 (*)    Platelets 143 (*)    All other components within normal limits  COMPREHENSIVE METABOLIC PANEL - Abnormal; Notable for the following components:    Glucose, Bld 147 (*)    BUN 35 (*)    Creatinine, Ser 1.39 (*)    Calcium 10.4 (*)    GFR, Estimated 36 (*)    All other components within normal limits  URINALYSIS, ROUTINE W REFLEX MICROSCOPIC - Abnormal; Notable for the following components:   Protein, ur TRACE (*)    Nitrite POSITIVE (*)    Leukocytes,Ua SMALL (*)    Bacteria, UA MANY (*)    All other components within normal limits  BRAIN NATRIURETIC PEPTIDE - Abnormal; Notable for the following components:   B Natriuretic Peptide 504.3 (*)    All other components within normal limits  TROPONIN I (HIGH SENSITIVITY) -  Abnormal; Notable for the following components:   Troponin I (High Sensitivity) 387 (*)    All other components within normal limits  TROPONIN I (HIGH SENSITIVITY) - Abnormal; Notable for the following components:   Troponin I (High Sensitivity) 365 (*)    All other components within normal limits  RESP PANEL BY RT-PCR (RSV, FLU A&B, COVID)  RVPGX2  LIPASE, BLOOD    EKG EKG Interpretation  Date/Time:  Monday Mar 11 2023 19:41:43 EDT Ventricular Rate:  91 PR Interval:  152 QRS Duration: 132 QT Interval:  370 QTC Calculation: 455 R Axis:   71 Text Interpretation: Sinus rhythm with Premature supraventricular complexes Left bundle branch block Abnormal ECG known LBBB no sgarbossas generally unchanged from prior When compared with ECG of 12-Dec-2021 19:15, PREVIOUS ECG IS PRESENT Confirmed by Vivien Rossetti ([id number]) on 03/11/2023 8:32:09 PM  Radiology CT CHEST ABDOMEN PELVIS WO CONTRAST  Result Date: 03/11/2023 CLINICAL DATA:  Chest pain, unspecified abdominal pain, diarrhea EXAM: CT CHEST, ABDOMEN AND PELVIS WITHOUT CONTRAST TECHNIQUE: Multidetector CT imaging of the chest, abdomen and pelvis was performed following the standard protocol without IV contrast. RADIATION DOSE REDUCTION: This exam was performed according to the departmental dose-optimization program which includes automated exposure control,  adjustment of the mA and/or kV according to patient size and/or use of iterative reconstruction technique. COMPARISON:  None Available. FINDINGS: CT CHEST FINDINGS Cardiovascular: Extensive multi-vessel coronary artery calcification. Status post coronary artery bypass grafting with single, stented saphenous vein graft to the distal right coronary artery. Mild cardiomegaly with left atrial enlargement is stable. No pericardial effusion. Enlargement of the central pulmonary arteries is again seen in keeping with changes of pulmonary arterial hypertension. Moderate atherosclerotic calcification within the thoracic aorta. No aortic aneurysm. Mediastinum/Nodes: No enlarged mediastinal, hilar, or axillary lymph nodes. Thyroid gland, trachea, and esophagus demonstrate no significant findings. Lungs/Pleura: Minimal subpleural pulmonary fibrotic change. Mild emphysema. There is bronchial wall thickening and airway impaction within the lower lobes bilaterally which can be seen in the setting of mucociliary disorders, immunologic deficiencies, or chronic aspiration. Chronic consolidation within the posterior basal right lower lobe is unchanged. No pneumothorax or pleural effusion. Musculoskeletal: The osseous structures are diffusely osteopenic. No acute bone abnormality. No lytic or blastic bone lesion. CT ABDOMEN PELVIS FINDINGS Hepatobiliary: No focal liver abnormality is seen. No gallstones, gallbladder wall thickening, or biliary dilatation. Pancreas: Unremarkable Spleen: Unremarkable Adrenals/Urinary Tract: The adrenal glands are unremarkable. The kidneys are normal on this noncontrast examination. Small right posterolateral bladder diverticulum noted. The bladder is otherwise unremarkable. Stomach/Bowel: The stomach, small bowel, and large bowel are unremarkable. No evidence of obstruction or focal inflammation. The appendix is not clearly identified, however, there are no secondary signs of appendicitis within the  right lower quadrant. No free intraperitoneal gas or fluid. Vascular/Lymphatic: Aortic atherosclerosis. No enlarged abdominal or pelvic lymph nodes. Reproductive: Status post hysterectomy. No adnexal masses. Other: No abdominal wall hernia or abnormality. No abdominopelvic ascites. Musculoskeletal: Osseous structures are diffusely osteopenic. Advanced degenerative changes are seen within the lumbar spine with multilevel degenerative facet ankylosis and stable retrolisthesis and degenerative ankylosis of L1-2 stable severe bilateral neuroforaminal narrowing at L1-2 as a result. No acute bone abnormality. No lytic or blastic bone lesion. IMPRESSION: 1. No acute intrathoracic or intra-abdominal pathology identified. No definite radiographic explanation for the patient's reported symptoms. 2. Extensive multi-vessel coronary artery calcification. Status post single-vessel coronary artery bypass grafting. 3. Morphologic changes in keeping with pulmonary arterial hypertension. 4. Mild emphysema. 5. Bronchial wall thickening  and airway impaction within the lower lobes bilaterally which can be seen in the setting of mucociliary disorders, immunologic deficiencies, or chronic aspiration. 6. Stable right basilar chronic consolidation. Aortic Atherosclerosis (ICD10-I70.0) and Emphysema (ICD10-J43.9). Electronically Signed   By: Helyn Numbers M.D.   On: 03/11/2023 22:10   CT Head Wo Contrast  Result Date: 03/11/2023 CLINICAL DATA:  Confusion, plan to start blood thinners EXAM: CT HEAD WITHOUT CONTRAST TECHNIQUE: Contiguous axial images were obtained from the base of the skull through the vertex without intravenous contrast. RADIATION DOSE REDUCTION: This exam was performed according to the departmental dose-optimization program which includes automated exposure control, adjustment of the mA and/or kV according to patient size and/or use of iterative reconstruction technique. COMPARISON:  CT head 10/09/2022 FINDINGS: Brain:  No intracranial hemorrhage, mass effect, or evidence of acute infarct. No hydrocephalus. No extra-axial fluid collection. Generalized cerebral atrophy. Ill-defined hypoattenuation within the cerebral white matter is nonspecific but consistent with chronic small vessel ischemic disease. Vascular: No hyperdense vessel. Intracranial arterial calcification. Skull: No fracture or focal lesion. Sinuses/Orbits: No acute finding. Paranasal sinuses and mastoid air cells are well aerated. Other: None. IMPRESSION: 1. No evidence of acute intracranial abnormality. 2. Chronic small vessel ischemic disease and cerebral atrophy. Electronically Signed   By: Minerva Fester M.D.   On: 03/11/2023 21:57   DG Chest 2 View  Result Date: 03/11/2023 CLINICAL DATA:  Chest pain and diarrhea. EXAM: CHEST - 2 VIEW COMPARISON:  October 08, 2022 FINDINGS: Multiple sternal wires are seen. The heart size and mediastinal contours are within normal limits. There is marked severity calcification of the aorta. Mild, diffuse, chronic appearing increased interstitial lung markings are noted. There is no evidence of focal consolidation, pleural effusion or pneumothorax. Multilevel degenerative changes are noted throughout the thoracic spine. IMPRESSION: 1. Evidence of prior median sternotomy/CABG. 2. Chronic appearing increased interstitial lung markings without acute cardiopulmonary disease. Electronically Signed   By: Aram Candela M.D.   On: 03/11/2023 20:22    Procedures .Critical Care  Performed by: Mardene Sayer, MD Authorized by: Mardene Sayer, MD   Critical care provider statement:    Critical care time (minutes):  30   Critical care was necessary to treat or prevent imminent or life-threatening deterioration of the following conditions:  Cardiac failure and CNS failure or compromise   Critical care was time spent personally by me on the following activities:  Development of treatment plan with patient or  surrogate, discussions with consultants, evaluation of patient's response to treatment, examination of patient, ordering and review of laboratory studies, ordering and review of radiographic studies, ordering and performing treatments and interventions, pulse oximetry, re-evaluation of patient's condition, review of old charts and obtaining history from patient or surrogate   Care discussed with: admitting provider       Medications Ordered in ED Medications  ipratropium-albuterol (DUONEB) 0.5-2.5 (3) MG/3ML nebulizer solution 3 mL (has no administration in time range)  aspirin chewable tablet 243 mg (243 mg Oral Given 03/11/23 2233)  iohexol (OMNIPAQUE) 350 MG/ML injection 100 mL (70 mLs Intravenous Contrast Given 03/11/23 2115)  Ampicillin-Sulbactam (UNASYN) 3 g in sodium chloride 0.9 % 100 mL IVPB (0 g Intravenous Stopped 03/11/23 2322)    ED Course/ Medical Decision Making/ A&P Clinical Course as of 03/12/23 0202  Mon Mar 11, 2023  2203 DG Chest 2 View [OZ]  2221 S/w Narcisse, MD cardiology who recommends following up repeat troponin and if elevating then start heparin drip.  Cardiology  will consult on this patient in hospital.  Likely will be medical management.  Recommends admission to hospitalist. [VB]  2356 Repeat troponin down trended to 365 with no active chest pain and cardiology recommendations, holding off from heparin drip.  Discussed with Dr. Arlean Hopping for admission for continued IV antibiotics to cover both aspiration pneumonia noted on CT scan as well as UTI which patient is symptomatic for.  She will also have inpatient cardiology consult for episode of chest pain and elevated troponin. [VB]    Clinical Course User Index [OZ] Smitty Knudsen, PA-C [VB] Mardene Sayer, MD                             Medical Decision Making   LOANN SCALF is a 89 y.o. female. With pmh CKD3b, ILD, COPD, HLD, hypothyroidism, HTN, chronic diastolic CHF, CAD status post CABG and PCI on  aspirin who presents after episode of chest pain that has since resolved as well as increasing mild confusion.   Suspect mild confusion is infectious related.  She does have UTI and typically act similar with prior infections.  Her UA has positive nitrite small leukocyte Estrace 11-20 WBCs and many bacteria with no squames.  CT head obtained no evidence of ICH reviewed by me.  Agree with radiology.  Patient had a concerning story of chest pain however chest pain-free and on exam.  Suspect ischemic event.  EKG left bundle branch block no acute changes from prior.  Initial troponin 387 repeat down trended to 365.  Patient took morning dose baby aspirin, gave further aspirin to give full dose 324.2221 S/w Narcisse, MD cardiology who recommends following up repeat troponin and if elevating then start heparin drip.  Cardiology will consult on this patient in hospital.  Likely will be medical management.  Recommends admission to hospitalist. [VB]   Patient had CTA dissection scan ordered although low suspicion for dissection with well appearance and no pulse deficits and no active chest pain on exam.  Contrast extravasated.  Findings on chest abdomen pelvis without contrast notable for multivessel coronary calcification signs of emphysema and bilateral airway impaction in the lower lobes suggestive of possible chronic aspiration.  Since patient is symptomatic with increased cough as well with UTI will cover both with IV Unasyn.  Discussed with Dr. Arlean Hopping for admission for continued IV antibiotics to cover both aspiration pneumonia noted on CT scan as well as UTI which patient is symptomatic for.  She will also have inpatient cardiology consult for episode of chest pain and elevated troponin. [VB]  Amount and/or Complexity of Data Reviewed Labs: ordered. Radiology: ordered.  Risk OTC drugs. Prescription drug management. Decision regarding hospitalization.     Final Clinical Impression(s) / ED  Diagnoses Final diagnoses:  Urinary tract infection without hematuria, site unspecified  Elevated troponin  Aspiration pneumonia of both lower lobes, unspecified aspiration pneumonia type [facility])    Rx / DC Orders ED Discharge Orders     None         Mardene Sayer, MD 03/12/23 0202

## 2023-03-12 ENCOUNTER — Inpatient Hospital Stay (HOSPITAL_COMMUNITY): Payer: Medicare (Managed Care)

## 2023-03-12 DIAGNOSIS — Z1152 Encounter for screening for COVID-19: Secondary | ICD-10-CM | POA: Diagnosis not present

## 2023-03-12 DIAGNOSIS — N184 Chronic kidney disease, stage 4 (severe): Secondary | ICD-10-CM | POA: Diagnosis present

## 2023-03-12 DIAGNOSIS — I272 Pulmonary hypertension, unspecified: Secondary | ICD-10-CM | POA: Diagnosis present

## 2023-03-12 DIAGNOSIS — I130 Hypertensive heart and chronic kidney disease with heart failure and stage 1 through stage 4 chronic kidney disease, or unspecified chronic kidney disease: Secondary | ICD-10-CM | POA: Diagnosis present

## 2023-03-12 DIAGNOSIS — R197 Diarrhea, unspecified: Secondary | ICD-10-CM | POA: Diagnosis present

## 2023-03-12 DIAGNOSIS — E7800 Pure hypercholesterolemia, unspecified: Secondary | ICD-10-CM | POA: Diagnosis present

## 2023-03-12 DIAGNOSIS — R079 Chest pain, unspecified: Secondary | ICD-10-CM

## 2023-03-12 DIAGNOSIS — I2489 Other forms of acute ischemic heart disease: Secondary | ICD-10-CM | POA: Diagnosis present

## 2023-03-12 DIAGNOSIS — I2510 Atherosclerotic heart disease of native coronary artery without angina pectoris: Secondary | ICD-10-CM

## 2023-03-12 DIAGNOSIS — E860 Dehydration: Secondary | ICD-10-CM | POA: Diagnosis present

## 2023-03-12 DIAGNOSIS — R4181 Age-related cognitive decline: Secondary | ICD-10-CM | POA: Diagnosis present

## 2023-03-12 DIAGNOSIS — R7989 Other specified abnormal findings of blood chemistry: Secondary | ICD-10-CM | POA: Diagnosis not present

## 2023-03-12 DIAGNOSIS — N3000 Acute cystitis without hematuria: Secondary | ICD-10-CM | POA: Diagnosis present

## 2023-03-12 DIAGNOSIS — J471 Bronchiectasis with (acute) exacerbation: Secondary | ICD-10-CM

## 2023-03-12 DIAGNOSIS — I340 Nonrheumatic mitral (valve) insufficiency: Secondary | ICD-10-CM | POA: Diagnosis present

## 2023-03-12 DIAGNOSIS — K219 Gastro-esophageal reflux disease without esophagitis: Secondary | ICD-10-CM | POA: Diagnosis present

## 2023-03-12 DIAGNOSIS — J849 Interstitial pulmonary disease, unspecified: Secondary | ICD-10-CM

## 2023-03-12 DIAGNOSIS — J4489 Other specified chronic obstructive pulmonary disease: Secondary | ICD-10-CM | POA: Diagnosis present

## 2023-03-12 DIAGNOSIS — I25118 Atherosclerotic heart disease of native coronary artery with other forms of angina pectoris: Secondary | ICD-10-CM

## 2023-03-12 DIAGNOSIS — I5032 Chronic diastolic (congestive) heart failure: Secondary | ICD-10-CM | POA: Diagnosis present

## 2023-03-12 DIAGNOSIS — E039 Hypothyroidism, unspecified: Secondary | ICD-10-CM

## 2023-03-12 DIAGNOSIS — I5A Non-ischemic myocardial injury (non-traumatic): Secondary | ICD-10-CM | POA: Diagnosis not present

## 2023-03-12 DIAGNOSIS — I447 Left bundle-branch block, unspecified: Secondary | ICD-10-CM | POA: Diagnosis present

## 2023-03-12 DIAGNOSIS — G9341 Metabolic encephalopathy: Secondary | ICD-10-CM | POA: Diagnosis present

## 2023-03-12 DIAGNOSIS — I25810 Atherosclerosis of coronary artery bypass graft(s) without angina pectoris: Secondary | ICD-10-CM | POA: Diagnosis present

## 2023-03-12 DIAGNOSIS — D509 Iron deficiency anemia, unspecified: Secondary | ICD-10-CM | POA: Diagnosis present

## 2023-03-12 DIAGNOSIS — N1832 Chronic kidney disease, stage 3b: Secondary | ICD-10-CM

## 2023-03-12 LAB — CBC WITH DIFFERENTIAL/PLATELET
Abs Immature Granulocytes: 0.06 10*3/uL (ref 0.00–0.07)
Basophils Absolute: 0 10*3/uL (ref 0.0–0.1)
Basophils Relative: 0 %
Eosinophils Absolute: 0 10*3/uL (ref 0.0–0.5)
Eosinophils Relative: 0 %
HCT: 29.9 % — ABNORMAL LOW (ref 36.0–46.0)
Hemoglobin: 10.3 g/dL — ABNORMAL LOW (ref 12.0–15.0)
Immature Granulocytes: 1 %
Lymphocytes Relative: 11 %
Lymphs Abs: 1.4 10*3/uL (ref 0.7–4.0)
MCH: 33.4 pg (ref 26.0–34.0)
MCHC: 34.4 g/dL (ref 30.0–36.0)
MCV: 97.1 fL (ref 80.0–100.0)
Monocytes Absolute: 1.3 10*3/uL — ABNORMAL HIGH (ref 0.1–1.0)
Monocytes Relative: 11 %
Neutro Abs: 9.3 10*3/uL — ABNORMAL HIGH (ref 1.7–7.7)
Neutrophils Relative %: 77 %
Platelets: 133 10*3/uL — ABNORMAL LOW (ref 150–400)
RBC: 3.08 MIL/uL — ABNORMAL LOW (ref 3.87–5.11)
RDW: 13 % (ref 11.5–15.5)
WBC: 12 10*3/uL — ABNORMAL HIGH (ref 4.0–10.5)
nRBC: 0 % (ref 0.0–0.2)

## 2023-03-12 LAB — COMPREHENSIVE METABOLIC PANEL
ALT: 11 U/L (ref 0–44)
AST: 21 U/L (ref 15–41)
Albumin: 3.2 g/dL — ABNORMAL LOW (ref 3.5–5.0)
Alkaline Phosphatase: 48 U/L (ref 38–126)
Anion gap: 13 (ref 5–15)
BUN: 26 mg/dL — ABNORMAL HIGH (ref 8–23)
CO2: 21 mmol/L — ABNORMAL LOW (ref 22–32)
Calcium: 9.5 mg/dL (ref 8.9–10.3)
Chloride: 102 mmol/L (ref 98–111)
Creatinine, Ser: 1.52 mg/dL — ABNORMAL HIGH (ref 0.44–1.00)
GFR, Estimated: 33 mL/min — ABNORMAL LOW (ref >60)
Glucose, Bld: 121 mg/dL — ABNORMAL HIGH (ref 70–99)
Potassium: 3.8 mmol/L (ref 3.5–5.1)
Sodium: 136 mmol/L (ref 135–145)
Total Bilirubin: 1.2 mg/dL (ref 0.3–1.2)
Total Protein: 6 g/dL — ABNORMAL LOW (ref 6.5–8.1)

## 2023-03-12 LAB — ECHOCARDIOGRAM COMPLETE
AR max vel: 2.01 cm2
AV Area VTI: 2.04 cm2
AV Area mean vel: 2.08 cm2
AV Mean grad: 7 mmHg
AV Peak grad: 13.2 mmHg
Ao pk vel: 1.82 m/s
Area-P 1/2: 4.26 cm2
Calc EF: 52.3 %
Height: 60 in
MV VTI: 2.08 cm2
S' Lateral: 3.1 cm
Single Plane A2C EF: 52.2 %
Single Plane A4C EF: 53.4 %
Weight: 2303.37 oz

## 2023-03-12 LAB — BLOOD GAS, VENOUS
Acid-Base Excess: 1.1 mmol/L (ref 0.0–2.0)
Bicarbonate: 25 mmol/L (ref 20.0–28.0)
O2 Saturation: 91.9 %
Patient temperature: 37.4
pCO2, Ven: 37 mmHg — ABNORMAL LOW (ref 44–60)
pH, Ven: 7.44 — ABNORMAL HIGH (ref 7.25–7.43)
pO2, Ven: 60 mmHg — ABNORMAL HIGH (ref 32–45)

## 2023-03-12 LAB — CBC
HCT: 32.8 % — ABNORMAL LOW (ref 36.0–46.0)
Hemoglobin: 11.2 g/dL — ABNORMAL LOW (ref 12.0–15.0)
MCH: 33.2 pg (ref 26.0–34.0)
MCHC: 34.1 g/dL (ref 30.0–36.0)
MCV: 97.3 fL (ref 80.0–100.0)
Platelets: 136 10*3/uL — ABNORMAL LOW (ref 150–400)
RBC: 3.37 MIL/uL — ABNORMAL LOW (ref 3.87–5.11)
RDW: 12.9 % (ref 11.5–15.5)
WBC: 12.6 10*3/uL — ABNORMAL HIGH (ref 4.0–10.5)
nRBC: 0 % (ref 0.0–0.2)

## 2023-03-12 LAB — PROCALCITONIN: Procalcitonin: <0.1 ng/mL

## 2023-03-12 LAB — TROPONIN I (HIGH SENSITIVITY)
Troponin I (High Sensitivity): 256 ng/L (ref <18)
Troponin I (High Sensitivity): 285 ng/L (ref <18)

## 2023-03-12 LAB — CREATININE, SERUM
Creatinine, Ser: 1.35 mg/dL — ABNORMAL HIGH (ref 0.44–1.00)
GFR, Estimated: 38 mL/min — ABNORMAL LOW (ref >60)

## 2023-03-12 LAB — MAGNESIUM: Magnesium: 0.8 mg/dL — CL (ref 1.7–2.4)

## 2023-03-12 MED ORDER — LOSARTAN POTASSIUM 50 MG PO TABS
50.0000 mg | ORAL_TABLET | Freq: Every day | ORAL | Status: DC
  Administered 2023-03-12 – 2023-03-14 (×3): 50 mg via ORAL
  Filled 2023-03-12 (×3): qty 1

## 2023-03-12 MED ORDER — MAGNESIUM SULFATE 2 GM/50ML IV SOLN
2.0000 g | Freq: Once | INTRAVENOUS | Status: AC
  Administered 2023-03-12: 2 g via INTRAVENOUS
  Filled 2023-03-12: qty 50

## 2023-03-12 MED ORDER — SODIUM CHLORIDE 0.9 % IV SOLN: INTRAVENOUS | Status: DC

## 2023-03-12 MED ORDER — LEVOTHYROXINE SODIUM 75 MCG PO TABS
175.0000 ug | ORAL_TABLET | Freq: Every evening | ORAL | Status: DC
  Administered 2023-03-12 – 2023-03-13 (×2): 175 ug via ORAL
  Filled 2023-03-12 (×2): qty 1

## 2023-03-12 MED ORDER — SODIUM CHLORIDE 0.9% FLUSH
3.0000 mL | Freq: Two times a day (BID) | INTRAVENOUS | Status: DC
  Administered 2023-03-12 – 2023-03-14 (×5): 3 mL via INTRAVENOUS

## 2023-03-12 MED ORDER — NITROGLYCERIN 2 % TD OINT
0.5000 [in_us] | TOPICAL_OINTMENT | Freq: Four times a day (QID) | TRANSDERMAL | Status: DC
  Administered 2023-03-12 – 2023-03-14 (×8): 0.5 [in_us] via TOPICAL
  Filled 2023-03-12 (×8): qty 1

## 2023-03-12 MED ORDER — ALBUTEROL SULFATE (2.5 MG/3ML) 0.083% IN NEBU: 3.0000 mL | INHALATION_SOLUTION | RESPIRATORY_TRACT | Status: DC | PRN

## 2023-03-12 MED ORDER — MAGNESIUM SULFATE IN D5W 1-5 GM/100ML-% IV SOLN
1.0000 g | Freq: Once | INTRAVENOUS | Status: AC
  Administered 2023-03-13: 1 g via INTRAVENOUS
  Filled 2023-03-12: qty 100

## 2023-03-12 MED ORDER — PANTOPRAZOLE SODIUM 40 MG PO TBEC
40.0000 mg | DELAYED_RELEASE_TABLET | Freq: Two times a day (BID) | ORAL | Status: DC
  Administered 2023-03-12 – 2023-03-14 (×5): 40 mg via ORAL
  Filled 2023-03-12 (×5): qty 1

## 2023-03-12 MED ORDER — SODIUM CHLORIDE 0.9% FLUSH: 10.0000 mL | INTRAVENOUS | Status: DC | PRN

## 2023-03-12 MED ORDER — MAGNESIUM SULFATE 50 % IJ SOLN: 3.0000 g | Freq: Once | INTRAVENOUS | Status: DC

## 2023-03-12 MED ORDER — SODIUM CHLORIDE 0.9 % IV SOLN
1.0000 g | INTRAVENOUS | Status: DC
  Administered 2023-03-12 – 2023-03-14 (×3): 1 g via INTRAVENOUS
  Filled 2023-03-12 (×3): qty 10

## 2023-03-12 MED ORDER — ONDANSETRON HCL 4 MG/2ML IJ SOLN: 4.0000 mg | Freq: Four times a day (QID) | INTRAMUSCULAR | Status: DC | PRN

## 2023-03-12 MED ORDER — ENOXAPARIN SODIUM 30 MG/0.3ML IJ SOSY
30.0000 mg | PREFILLED_SYRINGE | INTRAMUSCULAR | Status: DC
  Administered 2023-03-12 – 2023-03-14 (×3): 30 mg via SUBCUTANEOUS
  Filled 2023-03-12 (×3): qty 0.3

## 2023-03-12 MED ORDER — ASPIRIN 81 MG PO CHEW
81.0000 mg | CHEWABLE_TABLET | Freq: Every day | ORAL | Status: DC
  Administered 2023-03-12 – 2023-03-14 (×3): 81 mg via ORAL
  Filled 2023-03-12 (×3): qty 1

## 2023-03-12 MED ORDER — ATORVASTATIN CALCIUM 80 MG PO TABS
80.0000 mg | ORAL_TABLET | Freq: Every day | ORAL | Status: DC
  Administered 2023-03-12 – 2023-03-14 (×3): 80 mg via ORAL
  Filled 2023-03-12 (×3): qty 1

## 2023-03-12 MED ORDER — ACETAMINOPHEN 650 MG RE SUPP: 650.0000 mg | Freq: Four times a day (QID) | RECTAL | Status: DC | PRN

## 2023-03-12 MED ORDER — FERROUS SULFATE 325 (65 FE) MG PO TABS
325.0000 mg | ORAL_TABLET | Freq: Every day | ORAL | Status: DC
  Administered 2023-03-12 – 2023-03-14 (×3): 325 mg via ORAL
  Filled 2023-03-12 (×3): qty 1

## 2023-03-12 MED ORDER — METOPROLOL SUCCINATE ER 25 MG PO TB24
25.0000 mg | ORAL_TABLET | Freq: Every day | ORAL | Status: DC
  Administered 2023-03-12 – 2023-03-14 (×3): 25 mg via ORAL
  Filled 2023-03-12 (×3): qty 1

## 2023-03-12 MED ORDER — ACETAMINOPHEN 325 MG PO TABS
650.0000 mg | ORAL_TABLET | Freq: Four times a day (QID) | ORAL | Status: DC | PRN
  Administered 2023-03-12: 650 mg via ORAL
  Filled 2023-03-12: qty 2

## 2023-03-12 MED ORDER — ONDANSETRON HCL 4 MG PO TABS: 4.0000 mg | ORAL_TABLET | Freq: Four times a day (QID) | ORAL | Status: DC | PRN

## 2023-03-12 NOTE — Evaluation (Signed)
Clinical/Bedside Swallow Evaluation Patient Details  Name: Cindy [name] MRN: [id number] Date of Birth: Jan 18, 1932  Today's Date: 03/12/2023 Time: SLP Start Time (ACUTE ONLY): 8295 SLP Stop Time (ACUTE ONLY): 1008 SLP Time Calculation (min) (ACUTE ONLY): 10 min  Past Medical History:  Past Medical History:  Diagnosis Date   Asthma    Bronchitis    COPD (chronic obstructive pulmonary disease) (HCC)    Coronary artery disease    MI age 89   GERD (gastroesophageal reflux disease)    Heart attack (HCC)    Hypercholesteremia    Hypertension    ILD (interstitial lung disease) (HCC)    Iron deficiency anemia    used to see hem in [place], [place].    Renal insufficiency    Thyroid disease    Past Surgical History:  Past Surgical History:  Procedure Laterality Date   APPENDECTOMY     BREAST BIOPSY Right    benign   CORONARY ANGIOPLASTY WITH STENT PLACEMENT     CORONARY ARTERY BYPASS GRAFT  09/1979   LEFT HEART CATH AND CORS/GRAFTS ANGIOGRAPHY N/A 11/27/2018   Procedure: LEFT HEART CATH AND CORS/GRAFTS ANGIOGRAPHY;  Surgeon: Runell Gess, MD;  Location: [facility] INVASIVE CV LAB;  Service: Cardiovascular;  Laterality: N/A;   Moh's Surgeries     for squamous cell carcinoma   PARTIAL HYSTERECTOMY  10/1978   HPI:  Cindy [name] is a 89 y.o. female who presented after episode of chest pain that has since resolved as well as increasing mild confusion. Head CT5/20 with no acute findings.  Pt found to have UTI this admission, with similar confusion to what ha been observed during past UTIs.  CXR 5/20 with no acute cardiopulmonary disease.  Chest CT 5/20: "There is bronchial wall thickening and airway impaction within the lower lobes bilaterally which can be seen in the setting of mucociliary disorders, immunologic deficiencies, or chronic aspiration. Chronic consolidation within the posterior basal right lower lobe is unchanged."  Pt with pmh CKD3b, ILD, COPD, HLD, hypothyroidism, HTN, chronic  diastolic CHF, CAD status post CABG and PCI on aspirin.    Assessment / Plan / Recommendation  Clinical Impression  Pt presents with functional swallowing as assessed clinically.   Pt was drowsy, but able to rouse and briefly opened her eyes.  Pt did not follow directions for OME and kept eyes closed through most of evaluation.  She tolerated all consistencies trialed with no clinical s/s of aspriation, including serial straw sips of thin liquid.  Pt exhibited prolonged oral phase with regular solids, likely 2/2 drowsiness and confusion, but acheived adequate oral clearance independently. Son reports no indicators of dysphagia PTA.  He says shetends to eat one large meal a day and her husband worries that she doesn't eat enough. Chest imaging is concerning for potential markers of chronic aspiration.  At this time, MBS would likely be limited 2/2 AMS.  Pt appears safe to continue oral diet. SLP to follow for tolerance and possible instrumental assessment when more alert, if indicated.    Recommend regular texture diet with thin liquid.  SLP Visit Diagnosis: Dysphagia, unspecified (R13.10)    Aspiration Risk  Mild aspiration risk    Diet Recommendation Regular;Thin liquid   Liquid Administration via: Cup;Straw Medication Administration:  (As tolerated) Supervision: Staff to assist with self feeding Compensations: Slow rate;Small sips/bites Postural Changes: Seated upright at 90 degrees    Other  Recommendations Oral Care Recommendations: Oral care BID    Recommendations for follow  up therapy are one component of a multi-disciplinary discharge planning process, led by the attending physician.  Recommendations may be updated based on patient status, additional functional criteria and insurance authorization.  Follow up Recommendations  (Continue ST at next level of care at present, but recommendations may change over hospital course)      Assistance Recommended at Discharge    Functional  Status Assessment Patient has had a recent decline in their functional status and demonstrates the ability to make significant improvements in function in a reasonable and predictable amount of time.  Frequency and Duration min 2x/week  2 weeks       Prognosis Prognosis for improved oropharyngeal function: Good      Swallow Study   General Date of Onset: 03/11/23 HPI: Cindy [name] is a 89 y.o. female who presented after episode of chest pain that has since resolved as well as increasing mild confusion. Head CT5/20 with no acute findings.  Pt found to have UTI this admission, with similar confusion to what ha been observed during past UTIs.  CXR 5/20 with no acute cardiopulmonary disease.  Chest CT 5/20: "There is bronchial wall thickening and airway impaction within the lower lobes bilaterally which can be seen in the setting of mucociliary disorders, immunologic deficiencies, or chronic aspiration. Chronic consolidation within the posterior basal right lower lobe is unchanged."  Pt with pmh CKD3b, ILD, COPD, HLD, hypothyroidism, HTN, chronic diastolic CHF, CAD status post CABG and PCI on aspirin. Type of Study: Bedside Swallow Evaluation Previous Swallow Assessment: None Diet Prior to this Study: Regular;Thin liquids (Level 0) Temperature Spikes Noted: No History of Recent Intubation: No Behavior/Cognition: Lethargic/Drowsy Oral Cavity Assessment: Within Functional Limits Oral Care Completed by SLP: No Oral Cavity - Dentition: Adequate natural dentition Self-Feeding Abilities: Needs assist Patient Positioning: Upright in bed Baseline Vocal Quality: Normal Volitional Cough: Cognitively unable to elicit Volitional Swallow: Unable to elicit    Oral/Motor/Sensory Function Overall Oral Motor/Sensory Function:  (unable to assess 2/2 lethargy/confusion) Facial Symmetry: Within Functional Limits Mandible: Within Functional Limits   Ice Chips Ice chips: Not tested   Thin Liquid Thin  Liquid: Within functional limits Presentation: Straw    Nectar Thick Nectar Thick Liquid: Not tested   Honey Thick Honey Thick Liquid: Not tested   Puree Puree: Within functional limits Presentation: Spoon   Solid     Solid: Impaired Presentation:  (SLP fed) Oral Phase Functional Implications: Prolonged oral transit      Kerrie Pleasure, MA, CCC-SLP Acute Rehabilitation Services Office: [phone] 03/12/2023,10:24 AM

## 2023-03-12 NOTE — Progress Notes (Signed)
   03/12/23 2318  Provider Notification  Provider Name/Title Howerter DO  Date Provider Notified 03/12/23  Time Provider Notified 2318  Method of Notification Page  Notification Reason Critical Result  Test performed and critical result Mag 0.8  Date Critical Result Received 03/12/23  Time Critical Result Received 2316

## 2023-03-12 NOTE — Assessment & Plan Note (Signed)
Due to systemic illness superimposed on age related cognitive decline.  CTH normal.  Defer TSH, B12, ammonia, further neuraxial imaging etc. for now.

## 2023-03-12 NOTE — Plan of Care (Signed)

## 2023-03-12 NOTE — Consult Note (Addendum)
Cardiology Consultation   Patient ID: Cindy [name] MRN: [id number]; DOB: 30-Oct-1931  Admit date: 03/11/2023 Date of Consult: 03/12/2023  PCP:  Daisy Floro, MD   [facility] Providers Cardiologist:  Donato Staten, MD     Patient Profile:   Cindy [name] is a 89 y.o. female with a hx of HTN, CAD, HLD, COPD/interstitial lung disease, CKD, iron deficiency anemia, history of tobacco use, Hypothyroidism, GERD,  who is being seen 03/12/2023 for the evaluation of elevated troponin at the request of Dr. Rennis Harding  History of Present Illness:   Cindy [name] is a 89 year old female with above medical history who is followed by Dr. [name]. Per chart review, patient had an MI was she was 89 years old. Underwent single vessel bypass surgery in 1981. Later had 3 stents placed. She established care with Dr. [name] in 2015. Had a stress nuclear study on 12/31/13 that was a low risk study with moderate inferior wall scar and mild apical scar. There was limited peri-infarct ischemia and normal pumping function. Later, nuclear stress test on 02/28/16 was also a low risk study without ischemia.   Patient was admitted to the hospital in 11/2018 for evaluation of chest pain. Underwent echocardiogram on 11/27/18 that showed EF 55-60%, normal RV systolic function, mild mitral valve regurgitation. Left heart catheterization on 11/27/18 showed an occluded dominant RCA, occluded RCA vein graft with left-to-right collaterals. The left system was without significant disease. Patient was managed medically. Patient was last seen by Dr. [name] on 02/14/23. She was maintained on ASA, lipitor 80 mg daily, metoprolol succinate 50 mg daily, imdur 90 mg daily, spironolactone, HCTZ.   Patient presented to the ED at [facility] on 5/20 complaining of diarrhea, confusion, cough, frequent urination, and chest pain. Vital signs in the ED showed BP 148/77, HR 103 BPM, oxygen saturation 94% on room air. Labs in the ED showed creatinine  1.39, K 4.2, WBC 10.2, hemoglobin 12.0, platelets 143. hsTn [phone]. BNP elevated to 504. UA consistent with UTI. CXR with chronic appearing increased interstitial lung markings without acute cardiopulmonary disease. CT chest/abd showed no acute intrathoracic or intra-abdominal pathology, extensive multi-vessel CAD, mild emphysema.   On interview, patient reports that she has been going back and forth from the hospital a lot recently to visit her husband who is currently admitted for 4W. Over this past weekend, she noticed that she was getting very tired, and felt like she needed a break. Very early Monday morning, not long after midnight, patient developed left sided chest pain that seemed to spread down her left arm. She noticed mild shortness of breath as well, but denied other symptoms. She took 3 SL nitro, and was eventually able to fall asleep. She is not sure the pain fully subsided before she fell asleep. When she woke up Monday morning, she felt very fatigued. She had an episode of diarrhea, and had widespread body aches. She felt like she "hurt all over" and was urinating frequently. Her family noticed that she was started to act confused, so they took her to the ED. Since coming to the ED, patient has continued to have chills, body aches. She denies chest pain. Has some left shoulder and arm pain that is reproducible on palpation   Past Medical History:  Diagnosis Date   Asthma    Bronchitis    COPD (chronic obstructive pulmonary disease) (HCC)    Coronary artery disease    MI age 47   GERD (gastroesophageal  reflux disease)    Heart attack (HCC)    Hypercholesteremia    Hypertension    ILD (interstitial lung disease) (HCC)    Iron deficiency anemia    used to see hem in [place], [place].    Renal insufficiency    Thyroid disease     Past Surgical History:  Procedure Laterality Date   APPENDECTOMY     BREAST BIOPSY Right    benign   CORONARY ANGIOPLASTY WITH STENT PLACEMENT      CORONARY ARTERY BYPASS GRAFT  09/1979   LEFT HEART CATH AND CORS/GRAFTS ANGIOGRAPHY N/A 11/27/2018   Procedure: LEFT HEART CATH AND CORS/GRAFTS ANGIOGRAPHY;  Surgeon: Runell Gess, MD;  Location: [facility] INVASIVE CV LAB;  Service: Cardiovascular;  Laterality: N/A;   Moh's Surgeries     for squamous cell carcinoma   PARTIAL HYSTERECTOMY  10/1978     Home Medications:  Prior to Admission medications   Medication Sig Start Date End Date Taking? Authorizing Provider  albuterol (VENTOLIN HFA) 108 (90 Base) MCG/ACT inhaler Inhale 1 puff into the lungs every 4 (four) hours as needed for wheezing or shortness of breath. 11/17/21   [provider]  aspirin 81 MG tablet Take 81 mg by mouth daily.     [provider]  atorvastatin (LIPITOR) 80 MG tablet Take 1 tablet (80 mg total) by mouth daily. 12/25/18   Berton Bon, NP  benzonatate (TESSALON) 200 MG capsule Take 1 capsule by mouth twice daily as needed for cough Patient taking differently: Take 200 mg by mouth 2 (two) times daily. 09/23/20   Oretha Milch, MD  Biotin 5 MG CAPS Take by mouth daily at 6 (six) AM.    [provider]  Calcium Carbonate-Vitamin D 600-200 MG-UNIT TABS Take 1 tablet by mouth 2 (two) times daily.    [provider]  ferrous sulfate 325 (65 FE) MG tablet Take 325 mg by mouth daily with breakfast.    [provider]  fexofenadine (ALLEGRA) 180 MG tablet Take 1 tablet (180 mg total) by mouth daily. 01/28/23   Oretha Milch, MD  guaifenesin (HUMIBID E) 400 MG TABS tablet Take 400 mg by mouth. 12/27/22   [provider]  ipratropium (ATROVENT) 0.06 % nasal spray Place 2 sprays into both nostrils 3 (three) times daily.    [provider]  isosorbide mononitrate (IMDUR) 30 MG 24 hr tablet Take 90 mg by mouth every morning. 11/13/21   [provider]  levothyroxine (SYNTHROID, LEVOTHROID) 175 MCG tablet Take 175 mcg by mouth every evening.    [provider]   losartan (COZAAR) 50 MG tablet Take 50 mg by mouth daily.    [provider]  metoprolol succinate (TOPROL-XL) 25 MG 24 hr tablet Take 1 tablet (25 mg total) by mouth daily. 02/14/23   Jake Bathe, MD  montelukast (SINGULAIR) 10 MG tablet Take 10 mg by mouth at bedtime. 08/31/20   [provider]  Multiple Vitamins-Minerals (ICAPS AREDS 2 PO) Take 1 capsule by mouth in the morning and at bedtime.    [provider]  Niacinamide-Zn-Cu-Methfo-Se-Cr (NICOTINAMIDE PO) Take 1 capsule by mouth 2 (two) times daily.    [provider]  nitroGLYCERIN (NITROSTAT) 0.4 MG SL tablet PLACE 1 TABLET UNDER THE TONGUE EVERY 5 MINUTES AS NEEDED FOR CHEST PAIN Patient taking differently: Place 0.4 mg under the tongue every 5 (five) minutes as needed for chest pain. 09/11/19   Jake Bathe, MD  pantoprazole (PROTONIX) 40 MG tablet Take 40 mg by mouth 2 (two) times daily.    [provider]  spironolactone-hydrochlorothiazide (ALDACTAZIDE) 25-25 MG tablet Take 1 tablet by mouth daily.    [provider]  TART CHERRY PO Take 1,000 mg by mouth 2 (two) times a week.    [provider]  traMADol (ULTRAM) 50 MG tablet Take 50 mg by mouth 2 (two) times daily.    [provider]    Inpatient Medications: Scheduled Meds:  aspirin  81 mg Oral Daily   atorvastatin  80 mg Oral Daily   enoxaparin (LOVENOX) injection  30 mg Subcutaneous Q24H   ferrous sulfate  325 mg Oral Q breakfast   levothyroxine  175 mcg Oral QPM   nitroGLYCERIN  0.5 inch Topical Q6H   pantoprazole  40 mg Oral BID   sodium chloride flush  3 mL Intravenous Q12H   Continuous Infusions:  sodium chloride 75 mL/hr at 03/12/23 1113   cefTRIAXone (ROCEPHIN)  IV 1 g (03/12/23 1115)   PRN Meds: acetaminophen **OR** acetaminophen, albuterol, ipratropium-albuterol, ondansetron **OR** ondansetron (ZOFRAN) IV, sodium chloride flush  Allergies:    Allergies  Allergen Reactions    Codeine Other (See Comments)    Headache from heavy doses of codeine only    Social History:   Social History   Socioeconomic History   Marital status: Married    Spouse name: Not on file   Number of children: 2   Years of education: Not on file   Highest education level: Not on file  Occupational History   Occupation: retired    Comment: retired  Tobacco Use   Smoking status: Former    Packs/day: 1.50    Years: 22.00    Additional pack years: 0.00    Total pack years: 33.00    Types: Cigarettes    Quit date: 10/23/1979    Years since quitting: 43.4   Smokeless tobacco: Never  Vaping Use   Vaping Use: Never used  Substance and Sexual Activity   Alcohol use: Yes    Comment: occasional   Drug use: No   Sexual activity: Not on file  Other Topics Concern   Not on file  Social History Narrative   Not on file   Social Determinants of Health   Financial Resource Strain: Not on file  Food Insecurity: No Food Insecurity (03/12/2023)   Hunger Vital Sign    Worried About Running Out of Food in the Last Year: Never true    Ran Out of Food in the Last Year: Never true  Transportation Needs: No Transportation Needs (03/12/2023)   PRAPARE - Administrator, Civil Service (Medical): No    Lack of Transportation (Non-Medical): No  Physical Activity: Not on file  Stress: Not on file  Social Connections: Not on file  Intimate Partner Violence: Not At Risk (03/12/2023)   Humiliation, Afraid, Rape, and Kick questionnaire    Fear of Current or Ex-Partner: No    Emotionally Abused: No    Physically Abused: No    Sexually Abused: No    Family History:    Family History  Problem Relation Age of Onset   Heart disease Mother    Heart disease Father    Melanoma Sister        x2   Breast cancer Neg Hx      ROS:  Please see the history of present illness.   All other ROS reviewed and negative.  Physical Exam/Data:   Vitals:   03/11/23 1935 03/12/23 0240  03/12/23 0620 03/12/23 0905  BP: (!) 148/77 (!) 138/54 (!) 143/60 (!) 135/53  Pulse: (!) 103 88 95   Resp: 18 20 18    Temp: 98.3 F (36.8 C) 98.7 F (37.1 C) 98.7 F (37.1 C)   TempSrc: Oral Oral Oral   SpO2: 94%     Weight: 65.3 kg     Height: 5' (1.524 m)       Intake/Output Summary (Last 24 hours) at 03/12/2023 1313 Last data filed at 03/12/2023 0300 Gross per 24 hour  Intake 100 ml  Output --  Net 100 ml      03/11/2023    7:35 PM 02/14/2023   11:14 AM 01/01/2023   11:03 AM  Last 3 Weights  Weight (lbs) 143 lb 15.4 oz 144 lb 144 lb 9.6 oz  Weight (kg) 65.3 kg 65.318 kg 65.59 kg     Body mass index is 28.12 kg/m.  General:  Well nourished, well developed, in no acute distress. Laying in the bed with head elevated  HEENT: normal Neck: no JVD Vascular: Radial pulses 2+ bilaterally Cardiac:  normal S1, S2; RRR; grade 2/6 systolic murmur  Lungs:  crackles in bilateral lung bases. Clear with cough. Normal WOB on room air. Chest wall is tender to palpation  Abd: soft, nontender Ext: no edema in BLE Musculoskeletal:  No deformities, BUE and BLE strength normal and equal. L shoulder is tender to palpation  Skin: warm and dry  Neuro:  CNs 2-12 intact, no focal abnormalities noted Psych:  Normal affect   EKG:  The EKG was personally reviewed and demonstrates:  NSR with PACs, LBBB Telemetry:  Telemetry was personally reviewed and demonstrates:  sinus with PACs, HR in the 90s-100s  Relevant CV Studies:   Laboratory Data:  High Sensitivity Troponin:   Recent Labs  Lab 03/11/23 1937 03/11/23 2210  TROPONINIHS 387* 365*     Chemistry Recent Labs  Lab 03/11/23 1937 03/12/23 0838  NA 138  --   K 4.2  --   CL 103  --   CO2 22  --   GLUCOSE 147*  --   BUN 35*  --   CREATININE 1.39* 1.35*  CALCIUM 10.4*  --   GFRNONAA 36* 38*  ANIONGAP 13  --     Recent Labs  Lab 03/11/23 1937  PROT 7.4  ALBUMIN 4.7  AST 22  ALT 8  ALKPHOS 50  BILITOT 0.7   Lipids No  results for input(s): "CHOL", "TRIG", "HDL", "LABVLDL", "LDLCALC", "CHOLHDL" in the last 168 hours.  Hematology Recent Labs  Lab 03/11/23 1937 03/12/23 0838  WBC 10.2 12.6*  RBC 3.59* 3.37*  HGB 12.0 11.2*  HCT 35.0* 32.8*  MCV 97.5 97.3  MCH 33.4 33.2  MCHC 34.3 34.1  RDW 12.7 12.9  PLT 143* 136*   Thyroid No results for input(s): "TSH", "FREET4" in the last 168 hours.  BNP Recent Labs  Lab 03/11/23 1947  BNP 504.3*    DDimer No results for input(s): "DDIMER" in the last 168 hours.   Radiology/Studies:  CT CHEST ABDOMEN PELVIS WO CONTRAST  Result Date: 03/11/2023 CLINICAL DATA:  Chest pain, unspecified abdominal pain, diarrhea EXAM: CT CHEST, ABDOMEN AND PELVIS WITHOUT CONTRAST TECHNIQUE: Multidetector CT imaging of the chest, abdomen and pelvis was performed following the standard protocol without IV contrast. RADIATION DOSE REDUCTION: This exam was performed according to the departmental dose-optimization program which includes automated  exposure control, adjustment of the mA and/or kV according to patient size and/or use of iterative reconstruction technique. COMPARISON:  None Available. FINDINGS: CT CHEST FINDINGS Cardiovascular: Extensive multi-vessel coronary artery calcification. Status post coronary artery bypass grafting with single, stented saphenous vein graft to the distal right coronary artery. Mild cardiomegaly with left atrial enlargement is stable. No pericardial effusion. Enlargement of the central pulmonary arteries is again seen in keeping with changes of pulmonary arterial hypertension. Moderate atherosclerotic calcification within the thoracic aorta. No aortic aneurysm. Mediastinum/Nodes: No enlarged mediastinal, hilar, or axillary lymph nodes. Thyroid gland, trachea, and esophagus demonstrate no significant findings. Lungs/Pleura: Minimal subpleural pulmonary fibrotic change. Mild emphysema. There is bronchial wall thickening and airway impaction within the lower  lobes bilaterally which can be seen in the setting of mucociliary disorders, immunologic deficiencies, or chronic aspiration. Chronic consolidation within the posterior basal right lower lobe is unchanged. No pneumothorax or pleural effusion. Musculoskeletal: The osseous structures are diffusely osteopenic. No acute bone abnormality. No lytic or blastic bone lesion. CT ABDOMEN PELVIS FINDINGS Hepatobiliary: No focal liver abnormality is seen. No gallstones, gallbladder wall thickening, or biliary dilatation. Pancreas: Unremarkable Spleen: Unremarkable Adrenals/Urinary Tract: The adrenal glands are unremarkable. The kidneys are normal on this noncontrast examination. Small right posterolateral bladder diverticulum noted. The bladder is otherwise unremarkable. Stomach/Bowel: The stomach, small bowel, and large bowel are unremarkable. No evidence of obstruction or focal inflammation. The appendix is not clearly identified, however, there are no secondary signs of appendicitis within the right lower quadrant. No free intraperitoneal gas or fluid. Vascular/Lymphatic: Aortic atherosclerosis. No enlarged abdominal or pelvic lymph nodes. Reproductive: Status post hysterectomy. No adnexal masses. Other: No abdominal wall hernia or abnormality. No abdominopelvic ascites. Musculoskeletal: Osseous structures are diffusely osteopenic. Advanced degenerative changes are seen within the lumbar spine with multilevel degenerative facet ankylosis and stable retrolisthesis and degenerative ankylosis of L1-2 stable severe bilateral neuroforaminal narrowing at L1-2 as a result. No acute bone abnormality. No lytic or blastic bone lesion. IMPRESSION: 1. No acute intrathoracic or intra-abdominal pathology identified. No definite radiographic explanation for the patient's reported symptoms. 2. Extensive multi-vessel coronary artery calcification. Status post single-vessel coronary artery bypass grafting. 3. Morphologic changes in keeping  with pulmonary arterial hypertension. 4. Mild emphysema. 5. Bronchial wall thickening and airway impaction within the lower lobes bilaterally which can be seen in the setting of mucociliary disorders, immunologic deficiencies, or chronic aspiration. 6. Stable right basilar chronic consolidation. Aortic Atherosclerosis (ICD10-I70.0) and Emphysema (ICD10-J43.9). Electronically Signed   By: Helyn Numbers M.D.   On: 03/11/2023 22:10   CT Head Wo Contrast  Result Date: 03/11/2023 CLINICAL DATA:  Confusion, plan to start blood thinners EXAM: CT HEAD WITHOUT CONTRAST TECHNIQUE: Contiguous axial images were obtained from the base of the skull through the vertex without intravenous contrast. RADIATION DOSE REDUCTION: This exam was performed according to the departmental dose-optimization program which includes automated exposure control, adjustment of the mA and/or kV according to patient size and/or use of iterative reconstruction technique. COMPARISON:  CT head 10/09/2022 FINDINGS: Brain: No intracranial hemorrhage, mass effect, or evidence of acute infarct. No hydrocephalus. No extra-axial fluid collection. Generalized cerebral atrophy. Ill-defined hypoattenuation within the cerebral white matter is nonspecific but consistent with chronic small vessel ischemic disease. Vascular: No hyperdense vessel. Intracranial arterial calcification. Skull: No fracture or focal lesion. Sinuses/Orbits: No acute finding. Paranasal sinuses and mastoid air cells are well aerated. Other: None. IMPRESSION: 1. No evidence of acute intracranial abnormality. 2. Chronic small vessel ischemic disease  and cerebral atrophy. Electronically Signed   By: Minerva Fester M.D.   On: 03/11/2023 21:57   DG Chest 2 View  Result Date: 03/11/2023 CLINICAL DATA:  Chest pain and diarrhea. EXAM: CHEST - 2 VIEW COMPARISON:  October 08, 2022 FINDINGS: Multiple sternal wires are seen. The heart size and mediastinal contours are within normal limits.  There is marked severity calcification of the aorta. Mild, diffuse, chronic appearing increased interstitial lung markings are noted. There is no evidence of focal consolidation, pleural effusion or pneumothorax. Multilevel degenerative changes are noted throughout the thoracic spine. IMPRESSION: 1. Evidence of prior median sternotomy/CABG. 2. Chronic appearing increased interstitial lung markings without acute cardiopulmonary disease. Electronically Signed   By: Aram Candela M.D.   On: 03/11/2023 20:22     Assessment and Plan:   Elevated Troponin  CAD  - Patient has a known history of CAD- most recent cath in 2020 showed occluded dominant RCA, occluded RCA vein graft with left-right collaterals. Left system was without significant disease  - Patient now admitted with UTI, elevated trop  - hsTn [phone]. EKG with LBBB (chronic)  - Patient reports having an episode of chest pain very early Monday morning, not long after midnight. Relieved with SL nitro. Has not had chest pain since. Possible that patient had an ischemic event at that time and trops were already downtrending by the time she arrived in the ED Monday evening. However, chest pain is somewhat atypical as it is reproducible to palpation. More likely that trops are elevated due to illness/infection and dehydration  - Ordered echocardiogram to assess EF and wall motion. Further recs pending echo results  - No need for IV heparin given down-trending trops  - Continue ASA, lipitor 80 mg daily, metoprolol. Add back imdur when off nitro paste   HTN  - Resume home metoprolol succinate 25 mg daily  - Plan to resume imdur when off nitro paste  - Resume home losartan   HLD  - Continue lipitor 80 mg daily   Mild mitral valve regurgitation  - Noted on echocardiogram in 11/2018 - Ordered echo for this admission   Otherwise per primary  - Acute metabolic encephalopathy  - Diarrhea  - COPD  - CKD  - UTI  Risk Assessment/Risk  Scores:  For questions or updates, please contact [facility] Please consult [email or web address] for contact info under  Signed, Jonita Albee, PA-C  03/12/2023 1:13 PM  Patient seen and examined, note reviewed with the signed Advanced Practice Provider. I personally reviewed laboratory data, imaging studies and relevant notes. I independently examined the patient and formulated the important aspects of the plan. I have personally discussed the plan with the patient and/or family. Comments or changes to the note/plan are indicated below.  Patient seen examined her bedside.  Daughter-in-law by the bedside.  Patient open her eyes of voice.  Offers no complaints at this time.  No report of angina symptoms at this time it has resolved.   Echo performed today IMPRESSIONS   1. Left ventricular ejection fraction, by estimation, is 55 to 60%. The  left ventricle has normal function. The left ventricle demonstrates  regional wall motion abnormalities (see scoring diagram/findings for  description). Left ventricular diastolic  parameters are consistent with Grade I diastolic dysfunction (impaired  relaxation).   2. Right ventricular systolic function is normal. The right ventricular  size is normal. There is moderately elevated pulmonary artery systolic  pressure. The estimated right ventricular systolic pressure is  45.0 mmHg.   3. Left atrial size was moderately dilated.   4. The mitral valve is normal in structure. Mild to moderate mitral valve  regurgitation.   5. The aortic valve is tricuspid. There is mild calcification of the  aortic valve. Aortic valve regurgitation is not visualized. Aortic valve  sclerosis is present, with no evidence of aortic valve stenosis.   6. The inferior vena cava is dilated in size with <50% respiratory  variability, suggesting right atrial pressure of 15 mmHg.  Comparison(s): Prior images reviewed side by side. The left ventricular  wall motion  abnormality is new. Possible LBBB related wall motion artifact  is considered, but the apical wall motion is new compared to 2020 and LBBB  was present at that time as  well.    Coronary artery disease with mildly elevated troponin, no anginal pain Hypertension Hyperlipidemia Mild to moderate mitral regurgitation   Clinically she is not experiencing any anginal pain at this time.  Noted troponin peaked at 387 has not been trending down.  Suspect this may be also in the presence of acute illness.  Would recommend continuing with medical therapy including aspirin, statin as well as a beta-blocker.  Please restart her antianginal with Imdur.  Echocardiogram showed normal EF with mild apical segment small area of wall motion abnormalities.  As noted above for now with normal EF lets continue medical management unless her clinical picture change.  Her blood pressure is not well-controlled, will restart her home medications including losartan as well as Toprol-XL and her ambulatory I am hoping that this will help improve her blood pressure.  With acute metabolic encephalopathy/diarrhea/UTI will defer to the primary team.  Thomasene Ripple DO, MS [facility] Attending Cardiologist [facility]  [address] [place], [place] [address] [phone] Website: [email or web address]

## 2023-03-12 NOTE — Progress Notes (Addendum)
TR[facility] night cross cover note:   I was notified by RN that the patient is progressively more weak in a general sense, without report of any acute focal weakness. She was noted to be "very tired" throughout day shift and appears to require  progressive assistance with ambulation/transfers over the course of today, ultimately required use of stedy lift to assist patient back to bed from the [facility]. This is relative to last night, when the patient was reported to be able to ambulate with standby assist. Otherwise, pt without any new complaints at this time, including no CP.   VS notable for mildly elevated temp of 100.4 F, with HR's in 80's, BP 126/63, RR 18, and O2 sat of 96% on RA.  Per my chart review, patient is here with acute metabolic encephalopathy that is felt to be multifactorial in etiology, including contributions from acute exacerbation of underlying bronchiectasis, suspected urinary tract infection, NSTEMI.  Currently on Rocephin with cardiology following. I ensured existing orders for fall precautions, physical therapy, Occupational Therapy.   She just received acetaminophen for the mildly elevated temperature. Will continue to closely monitor, including monitoring for temperature response to the Tylenol. Will also check updated CMP, CBC, mag, vbg at this time to further assess her generalized weakness, lethargy.    Update: mag found to be low at 0.8. I've ordered MgSO4 3 g iv over 3 hours, and ordered repeat mag level for the AM.       Newton Pigg, DO Hospitalist

## 2023-03-12 NOTE — Assessment & Plan Note (Signed)
Diarrhea has stopped.  If no watery stool by this evening, will cancel GI panel and enteric precautions. - IV fluids

## 2023-03-12 NOTE — Plan of Care (Signed)
  Problem: Activity: Goal: Risk for activity intolerance will decrease Outcome: Progressing   Problem: Pain Managment: Goal: General experience of comfort will improve Outcome: Progressing   

## 2023-03-12 NOTE — Assessment & Plan Note (Signed)
Cr stable relative to baseline 1.1-1.3

## 2023-03-12 NOTE — Assessment & Plan Note (Signed)
Continue levothyroxine 

## 2023-03-12 NOTE — Assessment & Plan Note (Signed)
-   Continue aspirin, Lipitor, metoprolol - Consult Cardiology

## 2023-03-12 NOTE — Assessment & Plan Note (Signed)
-   Consult Cardiology appreciate cares

## 2023-03-12 NOTE — Assessment & Plan Note (Signed)
No urinary symptoms. I am uncertain if the bacteriuria is symptomatic.  Suspect her overall presentation is more from bronchiectasis and age, but this is speculative. - Follow urine culture

## 2023-03-12 NOTE — Assessment & Plan Note (Signed)
CT chest shows bronchiectasis changes.  Most prominent symptom per family is new frequent wet cough. - Sputum culture as able - Rocephin

## 2023-03-12 NOTE — Assessment & Plan Note (Signed)
Follows with Dr. Vassie Loll.  No COPD, has mild ILD and bronchiectasis, not on maintenance therapy or home O2.

## 2023-03-12 NOTE — H&P (Signed)
History and Physical    Patient: Cindy [name] WJX:[id number] DOB: 1931-11-12 DOA: 03/11/2023 DOS: the patient was seen and examined on 03/12/2023 PCP: Daisy Floro, MD  Patient coming from: Home-lives with husband who is currently hospitalized at [facility] due to hip fracture and is postsurgical repair  Chief Complaint:  Chief Complaint  Patient presents with   Chest Pain   HPI: Cindy [name] is a 89 y.o. female with medical history significant of CKD, iron deficiency anemia, interstitial lung disease/COPD, CAD, tobacco use in the past, HLD, hypothyroidism, hypertension and HFpEF.  At the present time the patient is residing at home alone noting that her husband had a fall and sustained a hip fracture and is currently hospitalized after hip fracture repair.  Patient was brought to the ED by family with reports of chest pain and diarrhea that began 24 hours prior to presentation.  Chest pain was of a classic pattern noting substernal and radiating down the left arm and improved with 3 nitroglycerin at home.  Patient continued to have diarrhea throughout the night.  Because of increasing confusion family brought the patient to the ER.  At initial presentation patient was afebrile and mildly hypertensive, O2 sats were slightly low at 94%.  Her creatinine was 1.39 with a BUN of 35, troponin was elevated at 387, white count was normal, hemoglobin 12, urinalysis abnormal concerning for UTI.  Follow-up troponin was 365.  BNP slightly elevated at 504 but is lower than her baseline in the 700 range.  PCR panel for influenza, RSV and COVID-negative.  EDP spoke with cardiology and stated that no urgent interventions regarding her chest pain need to be performed and that if troponin actually began to increase would need to initiate IV heparin.  In regards to her altered mental status CT of the head without contrast was unremarkable for acute changes.  Single view chest x-ray showed chronic appearing  interstitial markings.  CT chest abdomen and pelvis was unremarkable except for bronchial wall thickening and airway impaction within the lower lobes bilaterally which can be seen in the setting of mucociliary disorders, immunologic deficiencies, or chronic aspiration.Hospital service was consulted for admission.  Her primary cardiologist is Dr. [name].  My evaluation of the patient she remained quite lethargic even with her son sitting at the bedside and attempting to interact with her.  She was not eating or drinking.  She did briefly awaken while the blood pressure cuff was not flagging and complaining about pain in that same arm.  She was able to answer a few questions and denied any abdominal discomfort.  She denied any current chest pain or left arm pain.  She reports that the cough that she has been having is something she has all the time.  A repeat point-of-care creatinine has decreased slightly to 1.35 and her white count has increased to 12.6.  Review of Systems: As mentioned in the history of present illness. All other systems reviewed and are negative. Past Medical History:  Diagnosis Date   Asthma    Bronchitis    COPD (chronic obstructive pulmonary disease) (HCC)    Coronary artery disease    MI age 89   GERD (gastroesophageal reflux disease)    Heart attack (HCC)    Hypercholesteremia    Hypertension    ILD (interstitial lung disease) (HCC)    Iron deficiency anemia    used to see hem in [place], [place].    Renal insufficiency    Thyroid  disease    Past Surgical History:  Procedure Laterality Date   APPENDECTOMY     BREAST BIOPSY Right    benign   CORONARY ANGIOPLASTY WITH STENT PLACEMENT     CORONARY ARTERY BYPASS GRAFT  09/1979   LEFT HEART CATH AND CORS/GRAFTS ANGIOGRAPHY N/A 11/27/2018   Procedure: LEFT HEART CATH AND CORS/GRAFTS ANGIOGRAPHY;  Surgeon: Runell Gess, MD;  Location: [facility];  Service: Cardiovascular;  Laterality: N/A;   Moh's Surgeries      for squamous cell carcinoma   PARTIAL HYSTERECTOMY  10/1978   Social History:  reports that she quit smoking about 43 years ago. Her smoking use included cigarettes. She has a 33.00 pack-year smoking history. She has never used smokeless tobacco. She reports current alcohol use. She reports that she does not use drugs.  Allergies  Allergen Reactions   Codeine Other (See Comments)    Headache from heavy doses of codeine only    Family History  Problem Relation Age of Onset   Heart disease Mother    Heart disease Father    Melanoma Sister        x2   Breast cancer Neg Hx     Prior to Admission medications   Medication Sig Start Date End Date Taking? Authorizing Provider  albuterol (VENTOLIN HFA) 108 (90 Base) MCG/ACT inhaler Inhale 1 puff into the lungs every 4 (four) hours as needed for wheezing or shortness of breath. 11/17/21   [provider]  aspirin 81 MG tablet Take 81 mg by mouth daily.     [provider]  atorvastatin (LIPITOR) 80 MG tablet Take 1 tablet (80 mg total) by mouth daily. 12/25/18   Berton Bon, NP  benzonatate (TESSALON) 200 MG capsule Take 1 capsule by mouth twice daily as needed for cough Patient taking differently: Take 200 mg by mouth 2 (two) times daily. 09/23/20   Oretha Milch, MD  Biotin 5 MG CAPS Take by mouth daily at 6 (six) AM.    [provider]  Calcium Carbonate-Vitamin D 600-200 MG-UNIT TABS Take 1 tablet by mouth 2 (two) times daily.    [provider]  ferrous sulfate 325 (65 FE) MG tablet Take 325 mg by mouth daily with breakfast.    [provider]  fexofenadine (ALLEGRA) 180 MG tablet Take 1 tablet (180 mg total) by mouth daily. 01/28/23   Oretha Milch, MD  guaifenesin (HUMIBID E) 400 MG TABS tablet Take 400 mg by mouth. 12/27/22   [provider]  ipratropium (ATROVENT) 0.06 % nasal spray Place 2 sprays into both nostrils 3 (three) times daily.    [provider]  isosorbide  mononitrate (IMDUR) 30 MG 24 hr tablet Take 90 mg by mouth every morning. 11/13/21   [provider]  levothyroxine (SYNTHROID, LEVOTHROID) 175 MCG tablet Take 175 mcg by mouth every evening.    [provider]  losartan (COZAAR) 50 MG tablet Take 50 mg by mouth daily.    [provider]  metoprolol succinate (TOPROL-XL) 25 MG 24 hr tablet Take 1 tablet (25 mg total) by mouth daily. 02/14/23   Jake Bathe, MD  montelukast (SINGULAIR) 10 MG tablet Take 10 mg by mouth at bedtime. 08/31/20   [provider]  Multiple Vitamins-Minerals (ICAPS AREDS 2 PO) Take 1 capsule by mouth in the morning and at bedtime.    [provider]  Niacinamide-Zn-Cu-Methfo-Se-Cr (NICOTINAMIDE PO) Take 1 capsule by mouth 2 (two) times  daily.    [provider]  nitroGLYCERIN (NITROSTAT) 0.4 MG SL tablet PLACE 1 TABLET UNDER THE TONGUE EVERY 5 MINUTES AS NEEDED FOR CHEST PAIN Patient taking differently: Place 0.4 mg under the tongue every 5 (five) minutes as needed for chest pain. 09/11/19   Jake Bathe, MD  pantoprazole (PROTONIX) 40 MG tablet Take 40 mg by mouth 2 (two) times daily.    [provider]  spironolactone-hydrochlorothiazide (ALDACTAZIDE) 25-25 MG tablet Take 1 tablet by mouth daily.    [provider]  TART CHERRY PO Take 1,000 mg by mouth 2 (two) times a week.    [provider]  traMADol (ULTRAM) 50 MG tablet Take 50 mg by mouth 2 (two) times daily.    [provider]    Physical Exam: Vitals:   03/11/23 1935 03/12/23 0240 03/12/23 0620  BP: (!) 148/77 (!) 138/54 (!) 143/60  Pulse: (!) 103 88 95  Resp: 18 20 18   Temp: 98.3 F (36.8 C) 98.7 F (37.1 C) 98.7 F (37.1 C)  TempSrc: Oral Oral Oral  SpO2: 94%    Weight: 65.3 kg    Height: 5' (1.524 m)     Constitutional: NAD, calm, comfortable Respiratory: Coarse to auscultation bilaterally with wet sounding cough. Cardiovascular: Regular rate and rhythm, no  murmurs / rubs / gallops. No extremity edema. 2+ pedal pulses. .  Abdomen: no tenderness, no masses palpated.Bowel sounds positive.  Musculoskeletal: no clubbing / cyanosis. No joint deformity upper and lower extremities. Good ROM, no contractures. Normal muscle tone.  Skin: no rashes, lesions, ulcers. No induration Neurologic: CN 2-12 grossly intact. Sensation intact, unable to adequately test strength due to persistent lethargy Psychiatric: Lethargic and difficult to awaken and keep awake and normal mood.    Data Reviewed:  As per HPI  Assessment and Plan: Acute metabolic encephalopathy Suspect secondary to acute UTI and mild AKI Treat underlying causes  Urinary tract infection Continue empiric Rocephin Follow-up on blood cultures and urine culture No sepsis physiology  Known CAD and prior stents with elevated troponin Patient had classic chest pain that was substernal and radiated down left arm with elevation of troponin in the 300 range Continue to trend troponin Begin 0.5 inch of Nitropaste every 6 hours If troponin trends up cardiology has recommended add IV heparin Cardiology consultation pending Continue baby aspirin for now Recent symptomatic bradycardia so we will not order any beta-blockers  Diarrhea Patient had multiple episodes of diarrhea 24 hours prior to presentation No recent antibiotic use Check GI pathogen panel  COPD chronic interstitial lung disease/pulmonary hypertension Confirmed on CT Continue supportive care Continue albuterol nebs Atrovent nebs from home but will combine as DuoNeb  Chronic kidney disease Hold Cozaar acutely     Advance Care Planning:   Code Status: Full Code   DVT prophylaxis: Lovenox  Consults: Cardiology  Family Communication: Son at bedside  Severity of Illness: The appropriate patient status for this patient is INPATIENT. Inpatient status is judged to be reasonable and necessary in order to provide the required  intensity of service to ensure the patient's safety. The patient's presenting symptoms, physical exam findings, and initial radiographic and laboratory data in the context of their chronic comorbidities is felt to place them at high risk for further clinical deterioration. Furthermore, it is not anticipated that the patient will be medically stable for discharge from the hospital within 2 midnights of admission.   * I certify that at the point of admission it is  my clinical judgment that the patient will require inpatient hospital care spanning beyond 2 midnights from the point of admission due to high intensity of service, high risk for further deterioration and high frequency of surveillance required.*  Author: Junious Silk, NP 03/12/2023 7:31 AM  For on call review [email or web address].

## 2023-03-12 NOTE — Hospital Course (Signed)
Mrs. Perin is an 89 y.o. F with CAD s/p remote CABG, CKD IIIb baseline Cr 1.1-1.3, HTN, HLD, mild ILD, and low joint pain who presented with diarrhea, then CP and delayed responses.  In the ER, troponin 300s and downtrending.  BNP 504.  Cr 1.4, at baseline.  WBC up to 12K, UA with nitrites, bacteria. CTH normal, CT chest/abd/pelvis unremarkable.

## 2023-03-12 NOTE — Progress Notes (Signed)
  Nurse paged report patient is lethargic, hard to ambulate, HR 80,  BP stable. Chart reviewed. Suspected acute metabolic encephalopathy due to UTI. Advised nurse to page primary team for neuro evaluation and workup. Echo with preserved LVEF. Neuro exam change is not likely due to demand ischemia.

## 2023-03-13 DIAGNOSIS — J471 Bronchiectasis with (acute) exacerbation: Secondary | ICD-10-CM

## 2023-03-13 DIAGNOSIS — G9341 Metabolic encephalopathy: Secondary | ICD-10-CM

## 2023-03-13 DIAGNOSIS — I2510 Atherosclerotic heart disease of native coronary artery without angina pectoris: Secondary | ICD-10-CM | POA: Diagnosis not present

## 2023-03-13 LAB — BASIC METABOLIC PANEL
Anion gap: 10 (ref 5–15)
BUN: 27 mg/dL — ABNORMAL HIGH (ref 8–23)
CO2: 20 mmol/L — ABNORMAL LOW (ref 22–32)
Calcium: 9.3 mg/dL (ref 8.9–10.3)
Chloride: 104 mmol/L (ref 98–111)
Creatinine, Ser: 1.44 mg/dL — ABNORMAL HIGH (ref 0.44–1.00)
GFR, Estimated: 35 mL/min — ABNORMAL LOW (ref >60)
Glucose, Bld: 112 mg/dL — ABNORMAL HIGH (ref 70–99)
Potassium: 3.7 mmol/L (ref 3.5–5.1)
Sodium: 134 mmol/L — ABNORMAL LOW (ref 135–145)

## 2023-03-13 LAB — CBC
HCT: 31.4 % — ABNORMAL LOW (ref 36.0–46.0)
Hemoglobin: 10.7 g/dL — ABNORMAL LOW (ref 12.0–15.0)
MCH: 33.2 pg (ref 26.0–34.0)
MCHC: 34.1 g/dL (ref 30.0–36.0)
MCV: 97.5 fL (ref 80.0–100.0)
Platelets: 125 10*3/uL — ABNORMAL LOW (ref 150–400)
RBC: 3.22 MIL/uL — ABNORMAL LOW (ref 3.87–5.11)
RDW: 13 % (ref 11.5–15.5)
WBC: 12.2 10*3/uL — ABNORMAL HIGH (ref 4.0–10.5)
nRBC: 0 % (ref 0.0–0.2)

## 2023-03-13 LAB — MAGNESIUM: Magnesium: 1.6 mg/dL — ABNORMAL LOW (ref 1.7–2.4)

## 2023-03-13 MED ORDER — GUAIFENESIN 100 MG/5ML PO LIQD
10.0000 mL | Freq: Four times a day (QID) | ORAL | Status: DC | PRN
  Filled 2023-03-13: qty 10

## 2023-03-13 NOTE — Plan of Care (Signed)

## 2023-03-13 NOTE — Progress Notes (Signed)
PROGRESS NOTE    Cindy [name]  VWU:[id number] DOB: 01-Feb-1932 DOA: 03/11/2023 PCP: Daisy Floro, MD    Brief Narrative:  89 year old with history of chronic kidney disease stage IV, iron-deficiency anemia, interstitial lung disease and COPD, hypertension hyperlipidemia and chronic diastolic heart failure brought to the emergency room by family with reports of chest pain, diarrhea and also noting more somnolence.  In the emergency room on room air.  Blood pressures elevated.  Troponin 387.  White count normal.  Urinalysis abnormal.  Influenza, RSV and COVID-negative.  CT head without contrast was normal.  Chest x-ray with chronic appearing interstitial markings.  CT chest abdomen pelvis was normal except bronchial wall thickening and airway impaction.  Admitted due to lethargy, bronchitis and extreme weakness.   Assessment & Plan:   Acute metabolic encephalopathy likely secondary to multiple factors including bronchitis. Mental status has improved and normalized. Chest physiotherapy, incentive spirometry, deep breathing exercises, sputum induction, mucolytic's and bronchodilators. Continue antibiotics today.  Will continue Rocephin due to clinical improvement. Supplemental oxygen to keep saturations more than 90%.  Mobilize with PT OT.  Suspected UTI: Suspected UTI with abnormal urine.  Urine culture and blood cultures were not sent before restarting antibiotics.  Patient does not have any urinary symptoms.  Does have history of E. coli UTI in the past.  She is already on Rocephin that should cover it.  Troponinemia in a patient with known coronary artery disease and prior stents: Troponin elevated but flat, suspected secondary to demand ischemia. Seen by cardiology.  Recommended conservative management. Continue aspirin, atorvastatin, losartan, metoprolol and nitroglycerin.  Cardiology do not suggest invasive cardiac testing.  Hypomagnesemia: Severe and persistent.  Magnesium 0.8  on presentation.  Replaced aggressively.  Recheck level tomorrow morning.  Diarrhea: Self-limiting. COPD and pulmonary hypertension: Supportive care.  Nebulizers. CKD stage IV: At about her baseline.   DVT prophylaxis: enoxaparin (LOVENOX) injection 30 mg Start: 03/12/23 0830   Code Status: Full code Family Communication: Son at the bedside Disposition Plan: Status is: Inpatient Remains inpatient appropriate because: Significant shortness of breath, mobility issues.     Consultants:  None  Procedures:  None  Antimicrobials:  Rocephin 5/21----   Subjective: Patient seen and examined.  Overnight events noted.  She was noted to be lethargic in the evening.  Son at the bedside.  Today patient tells me that she is now more confused.  Son at the bedside tells me that patient looks as awake and alert and interactive as her baseline.  Still has significant cough but unable to expectorate his sputum.  Afebrile overnight.  Objective: Vitals:   03/12/23 2140 03/13/23 0334 03/13/23 0500 03/13/23 0723  BP:  (!) 166/68  (!) 154/54  Pulse:  82  76  Resp:  20  18  Temp: 98.1 F (36.7 C) 98.2 F (36.8 C)  99.2 F (37.3 C)  TempSrc: Oral Oral  Oral  SpO2:  97%  98%  Weight:   64 kg   Height:        Intake/Output Summary (Last 24 hours) at 03/13/2023 1106 Last data filed at 03/13/2023 0300 Gross per 24 hour  Intake 1513.22 ml  Output --  Net 1513.22 ml   Filed Weights   03/11/23 1935 03/13/23 0500  Weight: 65.3 kg 64 kg    Examination:  General exam: Appears calm and comfortable at rest. Frail and debilitated.  Chronically sick looking.  Not in any distress. Respiratory system: Clear to auscultation.  Some conducted upper  airway sounds. Cardiovascular system: S1 & S2 heard, RRR. No pedal edema. Gastrointestinal system: Soft and nontender.  Bowel sounds present. Central nervous system: Alert and oriented. No focal neurological deficits.  Gross generalized  weakness.   Data Reviewed: I have personally reviewed following labs and imaging studies  CBC: Recent Labs  Lab 03/11/23 1937 03/12/23 0838 03/12/23 2218 03/13/23 0152  WBC 10.2 12.6* 12.0* 12.2*  NEUTROABS  --   --  9.3*  --   HGB 12.0 11.2* 10.3* 10.7*  HCT 35.0* 32.8* 29.9* 31.4*  MCV 97.5 97.3 97.1 97.5  PLT 143* 136* 133* 125*   Basic Metabolic Panel: Recent Labs  Lab 03/11/23 1937 03/12/23 0838 03/12/23 2218 03/13/23 0152  NA 138  --  136 134*  K 4.2  --  3.8 3.7  CL 103  --  102 104  CO2 22  --  21* 20*  GLUCOSE 147*  --  121* 112*  BUN 35*  --  26* 27*  CREATININE 1.39* 1.35* 1.52* 1.44*  CALCIUM 10.4*  --  9.5 9.3  MG  --   --  0.8* 1.6*   GFR: Estimated Creatinine Clearance: 22.1 mL/min (A) (by C-G formula based on SCr of 1.44 mg/dL (H)). Liver Function Tests: Recent Labs  Lab 03/11/23 1937 03/12/23 2218  AST 22 21  ALT 8 11  ALKPHOS 50 48  BILITOT 0.7 1.2  PROT 7.4 6.0*  ALBUMIN 4.7 3.2*   Recent Labs  Lab 03/11/23 1937  LIPASE 25   No results for input(s): "AMMONIA" in the last 168 hours. Coagulation Profile: No results for input(s): "INR", "PROTIME" in the last 168 hours. Cardiac Enzymes: No results for input(s): "CKTOTAL", "CKMB", "CKMBINDEX", "TROPONINI" in the last 168 hours. BNP (last 3 results) No results for input(s): "PROBNP" in the last 8760 hours. HbA1C: No results for input(s): "HGBA1C" in the last 72 hours. CBG: No results for input(s): "GLUCAP" in the last 168 hours. Lipid Profile: No results for input(s): "CHOL", "HDL", "LDLCALC", "TRIG", "CHOLHDL", "LDLDIRECT" in the last 72 hours. Thyroid Function Tests: No results for input(s): "TSH", "T4TOTAL", "FREET4", "T3FREE", "THYROIDAB" in the last 72 hours. Anemia Panel: No results for input(s): "VITAMINB12", "FOLATE", "FERRITIN", "TIBC", "IRON", "RETICCTPCT" in the last 72 hours. Sepsis Labs: Recent Labs  Lab 03/12/23 1127  PROCALCITON <0.10    Recent Results (from  the past 240 hour(s))  Resp panel by RT-PCR (RSV, Flu A&B, Covid) Anterior Nasal Swab     Status: None   Collection Time: 03/11/23 10:17 PM   Specimen: Anterior Nasal Swab  Result Value Ref Range Status   SARS Coronavirus 2 by RT PCR NEGATIVE NEGATIVE Final    Comment: (NOTE) SARS-CoV-2 target nucleic acids are NOT DETECTED.  The SARS-CoV-2 RNA is generally detectable in upper respiratory specimens during the acute phase of infection. The lowest concentration of SARS-CoV-2 viral copies this assay can detect is 138 copies/mL. A negative result does not preclude SARS-Cov-2 infection and should not be used as the sole basis for treatment or other patient management decisions. A negative result may occur with  improper specimen collection/handling, submission of specimen other than nasopharyngeal swab, presence of viral mutation(s) within the areas targeted by this assay, and inadequate number of viral copies(<138 copies/mL). A negative result must be combined with clinical observations, patient history, and epidemiological information. The expected result is Negative.  Fact Sheet for Patients:  BloggerCourse.com  Fact Sheet for Healthcare Providers:  SeriousBroker.it  This test is no t yet approved  or cleared by the [place] and  has been authorized for detection and/or diagnosis of SARS-CoV-2 by FDA under an Emergency Use Authorization (EUA). This EUA will remain  in effect (meaning this test can be used) for the duration of the COVID-19 declaration under Section 564(b)(1) of the Act, 21 U.S.C.section 360bbb-3(b)(1), unless the authorization is terminated  or revoked sooner.       Influenza A by PCR NEGATIVE NEGATIVE Final   Influenza B by PCR NEGATIVE NEGATIVE Final    Comment: (NOTE) The Xpert Xpress SARS-CoV-2/FLU/RSV plus assay is intended as an aid in the diagnosis of influenza from Nasopharyngeal swab specimens  and should not be used as a sole basis for treatment. Nasal washings and aspirates are unacceptable for Xpert Xpress SARS-CoV-2/FLU/RSV testing.  Fact Sheet for Patients: BloggerCourse.com  Fact Sheet for Healthcare Providers: SeriousBroker.it  This test is not yet approved or cleared by the [place] FDA and has been authorized for detection and/or diagnosis of SARS-CoV-2 by FDA under an Emergency Use Authorization (EUA). This EUA will remain in effect (meaning this test can be used) for the duration of the COVID-19 declaration under Section 564(b)(1) of the Act, 21 U.S.C. section 360bbb-3(b)(1), unless the authorization is terminated or revoked.     Resp Syncytial Virus by PCR NEGATIVE NEGATIVE Final    Comment: (NOTE) Fact Sheet for Patients: BloggerCourse.com  Fact Sheet for Healthcare Providers: SeriousBroker.it  This test is not yet approved or cleared by the [place] FDA and has been authorized for detection and/or diagnosis of SARS-CoV-2 by FDA under an Emergency Use Authorization (EUA). This EUA will remain in effect (meaning this test can be used) for the duration of the COVID-19 declaration under Section 564(b)(1) of the Act, 21 U.S.C. section 360bbb-3(b)(1), unless the authorization is terminated or revoked.  Performed at [facility], [address] [place], [place] [address]          Radiology Studies: ECHOCARDIOGRAM COMPLETE  Result Date: 03/12/2023    ECHOCARDIOGRAM REPORT   Patient Name:   Cindy [name] Date of Exam: 03/12/2023 Medical Rec #:  [id number]       Height:       60.0 in Accession #:    [id number]      Weight:       144.0 lb Date of Birth:  September 02, 1932       BSA:          1.623 m Patient Age:    89 years        BP:           144/43 mmHg Patient Gender: F               HR:           134 bpm. Exam Location:  Inpatient  Procedure: 2D Echo, Cardiac Doppler and Color Doppler Indications:    Elevated Troponin  History:        Patient has prior history of Echocardiogram examinations, most                 recent 11/27/2018. CHF, hx myocaridal injury, Previous Myocardial                 Infarction and CAD, Prior CABG, Carotid Disease, COPD and CKD,                 interstitial lung disease, hx cancer, Signs/Symptoms:Chest Pain;  Risk Factors:Former Smoker, Dyslipidemia and Hypertension.  Sonographer:    Wallie Char Referring Phys: [id number] Jonita Albee IMPRESSIONS  1. Left ventricular ejection fraction, by estimation, is 55 to 60%. The left ventricle has normal function. The left ventricle demonstrates regional wall motion abnormalities (see scoring diagram/findings for description). Left ventricular diastolic parameters are consistent with Grade I diastolic dysfunction (impaired relaxation).  2. Right ventricular systolic function is normal. The right ventricular size is normal. There is moderately elevated pulmonary artery systolic pressure. The estimated right ventricular systolic pressure is 45.0 mmHg.  3. Left atrial size was moderately dilated.  4. The mitral valve is normal in structure. Mild to moderate mitral valve regurgitation.  5. The aortic valve is tricuspid. There is mild calcification of the aortic valve. Aortic valve regurgitation is not visualized. Aortic valve sclerosis is present, with no evidence of aortic valve stenosis.  6. The inferior vena cava is dilated in size with <50% respiratory variability, suggesting right atrial pressure of 15 mmHg. Comparison(s): Prior images reviewed side by side. The left ventricular wall motion abnormality is new. Possible LBBB related wall motion artifact is considered, but the apical wall motion is new compared to 2020 and LBBB was present at that time as well. FINDINGS  Left Ventricle: Left ventricular ejection fraction, by estimation, is 55 to 60%. The left  ventricle has normal function. The left ventricle demonstrates regional wall motion abnormalities. Mild hypokinesis of the left ventricular, apical septal wall and  anterior wall. The left ventricular internal cavity size was normal in size. There is borderline concentric left ventricular hypertrophy. Abnormal (paradoxical) septal motion, consistent with left bundle branch block. Left ventricular diastolic parameters are consistent with Grade I diastolic dysfunction (impaired relaxation).  LV Wall Scoring: The apical septal segment, apical anterior segment, and apex are hypokinetic. Right Ventricle: The right ventricular size is normal. No increase in right ventricular wall thickness. Right ventricular systolic function is normal. There is moderately elevated pulmonary artery systolic pressure. The tricuspid regurgitant velocity is 2.74 m/s, and with an assumed right atrial pressure of 15 mmHg, the estimated right ventricular systolic pressure is 45.0 mmHg. Left Atrium: Left atrial size was moderately dilated. Right Atrium: Right atrial size was normal in size. Pericardium: There is no evidence of pericardial effusion. Mitral Valve: The mitral valve is normal in structure. There is mild thickening of the mitral valve leaflet(s). Mild mitral annular calcification. Mild to moderate mitral valve regurgitation, with eccentric posteriorly directed jet. MV peak gradient, 7.3  mmHg. The mean mitral valve gradient is 3.0 mmHg. Tricuspid Valve: The tricuspid valve is normal in structure. Tricuspid valve regurgitation is mild. Aortic Valve: The aortic valve is tricuspid. There is mild calcification of the aortic valve. Aortic valve regurgitation is not visualized. Aortic valve sclerosis is present, with no evidence of aortic valve stenosis. Aortic valve mean gradient measures 7.0 mmHg. Aortic valve peak gradient measures 13.2 mmHg. Aortic valve area, by VTI measures 2.04 cm. Pulmonic Valve: The pulmonic valve was grossly  normal. Pulmonic valve regurgitation is not visualized. Aorta: The aortic root and ascending aorta are structurally normal, with no evidence of dilitation. Venous: The inferior vena cava is dilated in size with less than 50% respiratory variability, suggesting right atrial pressure of 15 mmHg. IAS/Shunts: No atrial level shunt detected by color flow Doppler.  LEFT VENTRICLE PLAX 2D LVIDd:         4.20 cm     Diastology LVIDs:         3.10  cm     LV e' medial:    3.79 cm/s LV PW:         1.10 cm     LV E/e' medial:  25.1 LV IVS:        1.20 cm     LV e' lateral:   5.96 cm/s LVOT diam:     2.00 cm     LV E/e' lateral: 16.0 LV SV:         67 LV SV Index:   41 LVOT Area:     3.14 cm  LV Volumes (MOD) LV vol d, MOD A2C: 72.8 ml LV vol d, MOD A4C: 84.3 ml LV vol s, MOD A2C: 34.8 ml LV vol s, MOD A4C: 39.3 ml LV SV MOD A2C:     38.0 ml LV SV MOD A4C:     84.3 ml LV SV MOD BP:      41.2 ml RIGHT VENTRICLE             IVC RV S prime:     14.20 cm/s  IVC diam: 2.50 cm TAPSE (M-mode): 1.6 cm LEFT ATRIUM             Index        RIGHT ATRIUM           Index LA diam:        3.90 cm 2.40 cm/m   RA Area:     10.50 cm LA Vol (A2C):   63.5 ml 39.12 ml/m  RA Volume:   19.00 ml  11.71 ml/m LA Vol (A4C):   58.7 ml 36.16 ml/m LA Biplane Vol: 63.1 ml 38.87 ml/m  AORTIC VALVE AV Area (Vmax):    2.01 cm AV Area (Vmean):   2.08 cm AV Area (VTI):     2.04 cm AV Vmax:           181.50 cm/s AV Vmean:          127.500 cm/s AV VTI:            0.328 m AV Peak Grad:      13.2 mmHg AV Mean Grad:      7.0 mmHg LVOT Vmax:         116.00 cm/s LVOT Vmean:        84.500 cm/s LVOT VTI:          0.213 m LVOT/AV VTI ratio: 0.65  AORTA Ao Root diam: 3.40 cm Ao Asc diam:  3.20 cm MITRAL VALVE                TRICUSPID VALVE MV Area (PHT): 4.26 cm     TV Peak grad:   35.8 mmHg MV Area VTI:   2.08 cm     TV Vmax:        2.99 m/s MV Peak grad:  7.3 mmHg     TR Peak grad:   30.0 mmHg MV Mean grad:  3.0 mmHg     TR Vmax:        274.00 cm/s MV Vmax:        1.35 m/s MV Vmean:      79.9 cm/s    SHUNTS MV Decel Time: 178 msec     Systemic VTI:  0.21 m MV E velocity: 95.30 cm/s   Systemic Diam: 2.00 cm MV A velocity: 137.00 cm/s MV E/A ratio:  0.70 Mihai Croitoru MD Electronically signed by Thurmon Fair MD Signature Date/Time: 03/12/2023/4:47:37 PM    Final  CT CHEST ABDOMEN PELVIS WO CONTRAST  Result Date: 03/11/2023 CLINICAL DATA:  Chest pain, unspecified abdominal pain, diarrhea EXAM: CT CHEST, ABDOMEN AND PELVIS WITHOUT CONTRAST TECHNIQUE: Multidetector CT imaging of the chest, abdomen and pelvis was performed following the standard protocol without IV contrast. RADIATION DOSE REDUCTION: This exam was performed according to the departmental dose-optimization program which includes automated exposure control, adjustment of the mA and/or kV according to patient size and/or use of iterative reconstruction technique. COMPARISON:  None Available. FINDINGS: CT CHEST FINDINGS Cardiovascular: Extensive multi-vessel coronary artery calcification. Status post coronary artery bypass grafting with single, stented saphenous vein graft to the distal right coronary artery. Mild cardiomegaly with left atrial enlargement is stable. No pericardial effusion. Enlargement of the central pulmonary arteries is again seen in keeping with changes of pulmonary arterial hypertension. Moderate atherosclerotic calcification within the thoracic aorta. No aortic aneurysm. Mediastinum/Nodes: No enlarged mediastinal, hilar, or axillary lymph nodes. Thyroid gland, trachea, and esophagus demonstrate no significant findings. Lungs/Pleura: Minimal subpleural pulmonary fibrotic change. Mild emphysema. There is bronchial wall thickening and airway impaction within the lower lobes bilaterally which can be seen in the setting of mucociliary disorders, immunologic deficiencies, or chronic aspiration. Chronic consolidation within the posterior basal right lower lobe is unchanged. No pneumothorax or  pleural effusion. Musculoskeletal: The osseous structures are diffusely osteopenic. No acute bone abnormality. No lytic or blastic bone lesion. CT ABDOMEN PELVIS FINDINGS Hepatobiliary: No focal liver abnormality is seen. No gallstones, gallbladder wall thickening, or biliary dilatation. Pancreas: Unremarkable Spleen: Unremarkable Adrenals/Urinary Tract: The adrenal glands are unremarkable. The kidneys are normal on this noncontrast examination. Small right posterolateral bladder diverticulum noted. The bladder is otherwise unremarkable. Stomach/Bowel: The stomach, small bowel, and large bowel are unremarkable. No evidence of obstruction or focal inflammation. The appendix is not clearly identified, however, there are no secondary signs of appendicitis within the right lower quadrant. No free intraperitoneal gas or fluid. Vascular/Lymphatic: Aortic atherosclerosis. No enlarged abdominal or pelvic lymph nodes. Reproductive: Status post hysterectomy. No adnexal masses. Other: No abdominal wall hernia or abnormality. No abdominopelvic ascites. Musculoskeletal: Osseous structures are diffusely osteopenic. Advanced degenerative changes are seen within the lumbar spine with multilevel degenerative facet ankylosis and stable retrolisthesis and degenerative ankylosis of L1-2 stable severe bilateral neuroforaminal narrowing at L1-2 as a result. No acute bone abnormality. No lytic or blastic bone lesion. IMPRESSION: 1. No acute intrathoracic or intra-abdominal pathology identified. No definite radiographic explanation for the patient's reported symptoms. 2. Extensive multi-vessel coronary artery calcification. Status post single-vessel coronary artery bypass grafting. 3. Morphologic changes in keeping with pulmonary arterial hypertension. 4. Mild emphysema. 5. Bronchial wall thickening and airway impaction within the lower lobes bilaterally which can be seen in the setting of mucociliary disorders, immunologic deficiencies,  or chronic aspiration. 6. Stable right basilar chronic consolidation. Aortic Atherosclerosis (ICD10-I70.0) and Emphysema (ICD10-J43.9). Electronically Signed   By: Helyn Numbers M.D.   On: 03/11/2023 22:10   CT Head Wo Contrast  Result Date: 03/11/2023 CLINICAL DATA:  Confusion, plan to start blood thinners EXAM: CT HEAD WITHOUT CONTRAST TECHNIQUE: Contiguous axial images were obtained from the base of the skull through the vertex without intravenous contrast. RADIATION DOSE REDUCTION: This exam was performed according to the departmental dose-optimization program which includes automated exposure control, adjustment of the mA and/or kV according to patient size and/or use of iterative reconstruction technique. COMPARISON:  CT head 10/09/2022 FINDINGS: Brain: No intracranial hemorrhage, mass effect, or evidence of acute infarct. No hydrocephalus. No extra-axial fluid collection.  Generalized cerebral atrophy. Ill-defined hypoattenuation within the cerebral white matter is nonspecific but consistent with chronic small vessel ischemic disease. Vascular: No hyperdense vessel. Intracranial arterial calcification. Skull: No fracture or focal lesion. Sinuses/Orbits: No acute finding. Paranasal sinuses and mastoid air cells are well aerated. Other: None. IMPRESSION: 1. No evidence of acute intracranial abnormality. 2. Chronic small vessel ischemic disease and cerebral atrophy. Electronically Signed   By: Minerva Fester M.D.   On: 03/11/2023 21:57   DG Chest 2 View  Result Date: 03/11/2023 CLINICAL DATA:  Chest pain and diarrhea. EXAM: CHEST - 2 VIEW COMPARISON:  October 08, 2022 FINDINGS: Multiple sternal wires are seen. The heart size and mediastinal contours are within normal limits. There is marked severity calcification of the aorta. Mild, diffuse, chronic appearing increased interstitial lung markings are noted. There is no evidence of focal consolidation, pleural effusion or pneumothorax. Multilevel  degenerative changes are noted throughout the thoracic spine. IMPRESSION: 1. Evidence of prior median sternotomy/CABG. 2. Chronic appearing increased interstitial lung markings without acute cardiopulmonary disease. Electronically Signed   By: Aram Candela M.D.   On: 03/11/2023 20:22        Scheduled Meds:  aspirin  81 mg Oral Daily   atorvastatin  80 mg Oral Daily   enoxaparin (LOVENOX) injection  30 mg Subcutaneous Q24H   ferrous sulfate  325 mg Oral Q breakfast   levothyroxine  175 mcg Oral QPM   losartan  50 mg Oral Daily   metoprolol succinate  25 mg Oral Daily   nitroGLYCERIN  0.5 inch Topical Q6H   pantoprazole  40 mg Oral BID   sodium chloride flush  3 mL Intravenous Q12H   Continuous Infusions:  cefTRIAXone (ROCEPHIN)  IV 1 g (03/13/23 0901)     LOS: 1 day    Time spent: 35 minutes    Dorcas Carrow, MD [facility] Pager [phone]

## 2023-03-13 NOTE — Progress Notes (Signed)
Speech Language Pathology Treatment: Dysphagia  Patient Details Name: Cindy [name] MRN: [id number] DOB: 05/29/1932 Today's Date: 03/13/2023 Time: 4098-1191 SLP Time Calculation (min) (ACUTE ONLY): 10 min  Assessment / Plan / Recommendation Clinical Impression  Pt much more alert today as compared to evaluation 5/21.  Pt engaged in pleasant conversation with no difficulty maintaining alertness.  She tolerated thin liquid by straw, puree, and regular solids with no clinical s/s of aspiration. She reports she is happy to be back on a regular diet instead of heart healthy and that source of chest pain was infection rather than cardiac event.  She also states that pneumonia was ruled out, favoring bronchial disease.  Pt has no further ST needs at this time.  SLP will sign off.   Recommend continuing regular texture diet with thin liquids.   HPI HPI: Cindy [name] is a 89 y.o. female who presented after episode of chest pain that has since resolved as well as increasing mild confusion. Head CT5/20 with no acute findings.  Pt found to have UTI this admission, with similar confusion to what ha been observed during past UTIs.  CXR 5/20 with no acute cardiopulmonary disease.  Chest CT 5/20: "There is bronchial wall thickening and airway impaction within the lower lobes bilaterally which can be seen in the setting of mucociliary disorders, immunologic deficiencies, or chronic aspiration. Chronic consolidation within the posterior basal right lower lobe is unchanged."  Pt with pmh CKD3b, ILD, COPD, HLD, hypothyroidism, HTN, chronic diastolic CHF, CAD status post CABG and PCI on aspirin.      SLP Plan  All goals met;Discharge SLP treatment due to (comment)      Recommendations for follow up therapy are one component of a multi-disciplinary discharge planning process, led by the attending physician.  Recommendations may be updated based on patient status, additional functional criteria and insurance  authorization.    Recommendations  Diet recommendations: Regular;Thin liquid Liquids provided via: Cup;Straw Medication Administration: Whole meds with liquid Supervision: Patient able to self feed Compensations: Slow rate;Small sips/bites Postural Changes and/or Swallow Maneuvers: Seated upright 90 degrees                  Oral care BID   None Dysphagia, unspecified (R13.10)     All goals met;Discharge SLP treatment due to (comment)     Kerrie Pleasure, MA, CCC-SLP Acute Rehabilitation Services Office: [phone] 03/13/2023, 9:43 AM

## 2023-03-13 NOTE — Plan of Care (Signed)
  Problem: Health Behavior/Discharge Planning: Goal: Ability to manage health-related needs will improve Outcome: Progressing   Problem: Clinical Measurements: Goal: Ability to maintain clinical measurements within normal limits will improve Outcome: Progressing Goal: Diagnostic test results will improve Outcome: Progressing   Problem: Activity: Goal: Risk for activity intolerance will decrease Outcome: Progressing   

## 2023-03-13 NOTE — Evaluation (Signed)
Occupational Therapy Evaluation Patient Details Name: Cindy [name] MRN: [id number] DOB: 13-Oct-1932 Today's Date: 03/13/2023   History of Present Illness Pt is a 89 year old female admitted on 03/11/23 for chest pain and confusion. Found to have bronchitis. Past medical history significant of CKD, iron deficiency anemia, interstitial lung disease/COPD, CAD, tobacco use in the past, HLD, hypothyroidism, hypertension and HFpEF.   Clinical Impression   PTA, pt lived with her husband who assisted with donning socks; pt's husband recently at [facility] due to hip fracture and pt unable to report whether he will continue being able to assist. Upon eval, pt presents near her functional baseline, requiring min A for LB ADL due to inability to reach feet and with deficits in RUE ROM. Pt also with LUE edema RN notified. Will follow to optimize LB ADL, but do not suspect need for OT follow up after discharge at this time.      Recommendations for follow up therapy are one component of a multi-disciplinary discharge planning process, led by the attending physician.  Recommendations may be updated based on patient status, additional functional criteria and insurance authorization.   Assistance Recommended at Discharge Intermittent Supervision/Assistance  Patient can return home with the following A little help with bathing/dressing/bathroom    Functional Status Assessment  Patient has had a recent decline in their functional status and demonstrates the ability to make significant improvements in function in a reasonable and predictable amount of time.  Equipment Recommendations  None recommended by OT    Recommendations for Other Services       Precautions / Restrictions Precautions Precautions: Fall Restrictions Weight Bearing Restrictions: No      Mobility Bed Mobility Overal bed mobility: Modified Independent                  Transfers Overall transfer level: Independent Equipment  used: None                      Balance Overall balance assessment: Mild deficits observed, not formally tested                                         ADL either performed or assessed with clinical judgement   ADL Overall ADL's : Needs assistance/impaired Eating/Feeding: Independent   Grooming: Independent   Upper Body Bathing: Modified independent   Lower Body Bathing: Minimal assistance   Upper Body Dressing : Modified independent   Lower Body Dressing: Minimal assistance Lower Body Dressing Details (indicate cue type and reason): could benefit from education regarding AE for donning/doffing socks Toilet Transfer: Modified Independent   Toileting- Clothing Manipulation and Hygiene: Modified independent   Tub/ Shower Transfer: Modified independent;Walk-in shower   Functional mobility during ADLs: Modified independent       Vision Baseline Vision/History: 1 Wears glasses Ability to See in Adequate Light: 0 Adequate Patient Visual Report: No change from baseline Additional Comments: decr visual acuity but sespect baseline. Glasses not present in session     Perception     Praxis      Pertinent Vitals/Pain Pain Assessment Pain Assessment: No/denies pain     Hand Dominance Right   Extremity/Trunk Assessment Upper Extremity Assessment Upper Extremity Assessment: Overall WFL for tasks assessed;LUE deficits/detail;RUE deficits/detail RUE Deficits / Details: h/o bil rotator cuff repairs. Unable to lift RUE above ~70 degrees LUE Deficits / Details: LUE edetamous  and discolored around IV site   Lower Extremity Assessment Lower Extremity Assessment: Defer to PT evaluation   Cervical / Trunk Assessment Cervical / Trunk Assessment: Kyphotic   Communication Communication Communication: No difficulties   Cognition Arousal/Alertness: Awake/alert Behavior During Therapy: WFL for tasks assessed/performed Overall Cognitive Status: Within  Functional Limits for tasks assessed                                       General Comments  VSS on RA    Exercises     Shoulder Instructions      Home Living Family/patient expects to be discharged to:: Private residence Living Arrangements: Spouse/significant other Available Help at Discharge: Family;Other (Comment) (Pt spouse currently here at [facility] for hip fracture) Type of Home: Other(Comment) (Condo) Home Access: Stairs to enter Entrance Stairs-Number of Steps: 1 Entrance Stairs-Rails: None Home Layout: One level     Bathroom Shower/Tub: Producer, television/film/video: Standard Bathroom Accessibility: Yes   Home Equipment: Educational psychologist (2 wheels);Rollator (4 wheels);Cane - quad;BSC/3in1;Shower seat - built in;Grab bars - tub/shower          Prior Functioning/Environment Prior Level of Function : Independent/Modified Independent;Driving             Mobility Comments: Mod I no AD at home and rollator out in the community ADLs Comments: Ind        OT Problem List: Decreased strength;Impaired balance (sitting and/or standing);Decreased range of motion      OT Treatment/Interventions: Self-care/ADL training;Therapeutic exercise;DME and/or AE instruction;Balance training;Patient/family education;Therapeutic activities    OT Goals(Current goals can be found in the care plan section) Acute Rehab OT Goals Patient Stated Goal: go home OT Goal Formulation: With patient Time For Goal Achievement: 03/27/23 Potential to Achieve Goals: Good  OT Frequency: Min 2X/week    Co-evaluation              AM-PAC OT "6 Clicks" Daily Activity     Outcome Measure Help from another person eating meals?: None Help from another person taking care of personal grooming?: None Help from another person toileting, which includes using toliet, bedpan, or urinal?: None Help from another person bathing (including washing, rinsing, drying)?:  None Help from another person to put on and taking off regular upper body clothing?: None Help from another person to put on and taking off regular lower body clothing?: A Little 6 Click Score: 23   End of Session Nurse Communication: Mobility status;Other (comment) (LUE edema)  Activity Tolerance: Patient tolerated treatment well Patient left: in bed;with bed alarm set;with call bell/phone within reach  OT Visit Diagnosis: Unsteadiness on feet (R26.81);Muscle weakness (generalized) (M62.81)                Time: 1610-9604 OT Time Calculation (min): 39 min Charges:  OT General Charges $OT Visit: 1 Visit OT Evaluation $OT Eval Low Complexity: 1 Low OT Treatments $Self Care/Home Management : 23-37 mins  Tyler Deis, OTR/L [facility] Acute Rehabilitation Office: [phone]   Cindy [name] 03/13/2023, 5:13 PM

## 2023-03-13 NOTE — Evaluation (Signed)
Physical Therapy Evaluation Patient Details Name: Cindy [name] MRN: [id number] DOB: Mar 14, 1932 Today's Date: 03/13/2023  History of Present Illness  Pt is a 89 year old female admitted on 03/11/23 for chest pain and confusion. Past medical history significant of CKD, iron deficiency anemia, interstitial lung disease/COPD, CAD, tobacco use in the past, HLD, hypothyroidism, hypertension and HFpEF.  Clinical Impression  Pt presents with admitting diagnosis above. Pt was able to ambulate in hallway with no AD at supervision level. Pt presents at or near baseline mobility. Pt has no further acute PT needs and will be signing off. Re consult PT if mobility status changes.        Recommendations for follow up therapy are one component of a multi-disciplinary discharge planning process, led by the attending physician.  Recommendations may be updated based on patient status, additional functional criteria and insurance authorization.  Follow Up Recommendations       Assistance Recommended at Discharge PRN  Patient can return home with the following  A little help with walking and/or transfers;Assistance with cooking/housework;Direct supervision/assist for medications management;Assist for transportation;Help with stairs or ramp for entrance    Equipment Recommendations None recommended by PT  Recommendations for Other Services       Functional Status Assessment Patient has had a recent decline in their functional status and demonstrates the ability to make significant improvements in function in a reasonable and predictable amount of time.     Precautions / Restrictions Precautions Precautions: Fall Restrictions Weight Bearing Restrictions: No      Mobility  Bed Mobility Overal bed mobility: Modified Independent                  Transfers Overall transfer level: Independent Equipment used: None                    Ambulation/Gait Ambulation/Gait assistance:  Supervision Gait Distance (Feet): 150 Feet Assistive device: None Gait Pattern/deviations: Trunk flexed, Staggering left, Staggering right, Step-through pattern Gait velocity: decreased     General Gait Details: Pt with slow steady gait pattern. Pt occasionally reached for walls due to fatigue. no LOB noted.  Stairs            Wheelchair Mobility    Modified Rankin (Stroke Patients Only)       Balance Overall balance assessment: Mild deficits observed, not formally tested                                           Pertinent Vitals/Pain Pain Assessment Pain Assessment: No/denies pain    Home Living Family/patient expects to be discharged to:: Private residence Living Arrangements: Spouse/significant other Available Help at Discharge: Family;Other (Comment) (Pt spouse currently here at [facility] for hip fracture) Type of Home: Other(Comment) (Condo) Home Access: Stairs to enter Entrance Stairs-Rails: None Entrance Stairs-Number of Steps: 1   Home Layout: One level Home Equipment: Educational psychologist (2 wheels);Rollator (4 wheels);Cane - quad;BSC/3in1;Shower seat - built in;Grab bars - tub/shower      Prior Function Prior Level of Function : Independent/Modified Independent;Driving             Mobility Comments: Mod I no AD at home and rollator out in the community ADLs Comments: Ind     Hand Dominance   Dominant Hand: Right    Extremity/Trunk Assessment   Upper Extremity Assessment Upper Extremity Assessment: Overall  WFL for tasks assessed    Lower Extremity Assessment Lower Extremity Assessment: Overall WFL for tasks assessed    Cervical / Trunk Assessment Cervical / Trunk Assessment: Kyphotic  Communication   Communication: No difficulties  Cognition Arousal/Alertness: Awake/alert Behavior During Therapy: WFL for tasks assessed/performed Overall Cognitive Status: Within Functional Limits for tasks assessed                                           General Comments General comments (skin integrity, edema, etc.): VSS on RA    Exercises     Assessment/Plan    PT Assessment Patient does not need any further PT services  PT Problem List         PT Treatment Interventions      PT Goals (Current goals can be found in the Care Plan section)       Frequency       Co-evaluation               AM-PAC PT "6 Clicks" Mobility  Outcome Measure Help needed turning from your back to your side while in a flat bed without using bedrails?: None Help needed moving from lying on your back to sitting on the side of a flat bed without using bedrails?: None Help needed moving to and from a bed to a chair (including a wheelchair)?: None Help needed standing up from a chair using your arms (e.g., wheelchair or bedside chair)?: None Help needed to walk in hospital room?: A Little Help needed climbing 3-5 steps with a railing? : A Little 6 Click Score: 22    End of Session Equipment Utilized During Treatment: Gait belt Activity Tolerance: Patient tolerated treatment well Patient left: in bed;with call bell/phone within reach;with bed alarm set;with family/visitor present Nurse Communication: Mobility status PT Visit Diagnosis: Other abnormalities of gait and mobility (R26.89)    Time: 1036-1100 PT Time Calculation (min) (ACUTE ONLY): 24 min   Charges:   PT Evaluation $PT Eval Low Complexity: 1 Low PT Treatments $Gait Training: 8-22 mins        Shela Nevin, PT, DPT Acute Rehab Services [phone]   Gladys Damme 03/13/2023, 4:00 PM

## 2023-03-13 NOTE — Progress Notes (Signed)
Progress Note  Patient Name: Cindy [name] Date of Encounter: 03/13/2023  Primary Cardiologist: Donato Wheatley, MD   Subjective   Patient seen examined her bedside patient awake when I arrived.  No specific complaints  Inpatient Medications    Scheduled Meds:  aspirin  81 mg Oral Daily   atorvastatin  80 mg Oral Daily   enoxaparin (LOVENOX) injection  30 mg Subcutaneous Q24H   ferrous sulfate  325 mg Oral Q breakfast   levothyroxine  175 mcg Oral QPM   losartan  50 mg Oral Daily   metoprolol succinate  25 mg Oral Daily   nitroGLYCERIN  0.5 inch Topical Q6H   pantoprazole  40 mg Oral BID   sodium chloride flush  3 mL Intravenous Q12H   Continuous Infusions:  sodium chloride 75 mL/hr at 03/13/23 0430   cefTRIAXone (ROCEPHIN)  IV Stopped (03/12/23 1238)   PRN Meds: acetaminophen **OR** acetaminophen, albuterol, ipratropium-albuterol, ondansetron **OR** ondansetron (ZOFRAN) IV, sodium chloride flush   Vital Signs    Vitals:   03/12/23 2140 03/13/23 0334 03/13/23 0500 03/13/23 0723  BP:  (!) 166/68  (!) 154/54  Pulse:  82  76  Resp:  20  18  Temp: 98.1 F (36.7 C) 98.2 F (36.8 C)  99.2 F (37.3 C)  TempSrc: Oral Oral  Oral  SpO2:  97%  98%  Weight:   64 kg   Height:        Intake/Output Summary (Last 24 hours) at 03/13/2023 1610 Last data filed at 03/13/2023 0300 Gross per 24 hour  Intake 1513.22 ml  Output --  Net 1513.22 ml   Filed Weights   03/11/23 1935 03/13/23 0500  Weight: 65.3 kg 64 kg    Telemetry    Sinus rhythm- Personally Reviewed  ECG    None- Personally Reviewed  Physical Exam    General: Comfortable,  Head: Atraumatic, normal size  Eyes: PEERLA, EOMI  Neck: Supple, normal JVD Cardiac: Normal S1, S2; RRR; no murmurs, rubs, or gallops Lungs: Clear to auscultation bilaterally Abd: Soft, nontender, no hepatomegaly  Ext: warm, no edema Musculoskeletal: No deformities, BUE and BLE strength normal and equal Skin: Warm and dry, no  rashes   Neuro: Alert and oriented to person, place, time, and situation, CNII-XII grossly intact, no focal deficits  Psych: Normal mood and affect   Labs    Chemistry Recent Labs  Lab 03/11/23 1937 03/12/23 0838 03/12/23 2218 03/13/23 0152  NA 138  --  136 134*  K 4.2  --  3.8 3.7  CL 103  --  102 104  CO2 22  --  21* 20*  GLUCOSE 147*  --  121* 112*  BUN 35*  --  26* 27*  CREATININE 1.39* 1.35* 1.52* 1.44*  CALCIUM 10.4*  --  9.5 9.3  PROT 7.4  --  6.0*  --   ALBUMIN 4.7  --  3.2*  --   AST 22  --  21  --   ALT 8  --  11  --   ALKPHOS 50  --  48  --   BILITOT 0.7  --  1.2  --   GFRNONAA 36* 38* 33* 35*  ANIONGAP 13  --  13 10     Hematology Recent Labs  Lab 03/12/23 0838 03/12/23 2218 03/13/23 0152  WBC 12.6* 12.0* 12.2*  RBC 3.37* 3.08* 3.22*  HGB 11.2* 10.3* 10.7*  HCT 32.8* 29.9* 31.4*  MCV 97.3 97.1 97.5  MCH  33.2 33.4 33.2  MCHC 34.1 34.4 34.1  RDW 12.9 13.0 13.0  PLT 136* 133* 125*    Cardiac EnzymesNo results for input(s): "TROPONINI" in the last 168 hours. No results for input(s): "TROPIPOC" in the last 168 hours.   BNP Recent Labs  Lab 03/11/23 1947  BNP 504.3*     DDimer No results for input(s): "DDIMER" in the last 168 hours.   Radiology    ECHOCARDIOGRAM COMPLETE  Result Date: 03/12/2023    ECHOCARDIOGRAM REPORT   Patient Name:   Cindy [name] Date of Exam: 03/12/2023 Medical Rec #:  [id number]       Height:       60.0 in Accession #:    [id number]      Weight:       144.0 lb Date of Birth:  1932/03/14       BSA:          1.623 m Patient Age:    89 years        BP:           144/43 mmHg Patient Gender: F               HR:           134 bpm. Exam Location:  Inpatient Procedure: 2D Echo, Cardiac Doppler and Color Doppler Indications:    Elevated Troponin  History:        Patient has prior history of Echocardiogram examinations, most                 recent 11/27/2018. CHF, hx myocaridal injury, Previous Myocardial                 Infarction and  CAD, Prior CABG, Carotid Disease, COPD and CKD,                 interstitial lung disease, hx cancer, Signs/Symptoms:Chest Pain;                 Risk Factors:Former Smoker, Dyslipidemia and Hypertension.  Sonographer:    Wallie Char Referring Phys: [id number] Jonita Albee IMPRESSIONS  1. Left ventricular ejection fraction, by estimation, is 55 to 60%. The left ventricle has normal function. The left ventricle demonstrates regional wall motion abnormalities (see scoring diagram/findings for description). Left ventricular diastolic parameters are consistent with Grade I diastolic dysfunction (impaired relaxation).  2. Right ventricular systolic function is normal. The right ventricular size is normal. There is moderately elevated pulmonary artery systolic pressure. The estimated right ventricular systolic pressure is 45.0 mmHg.  3. Left atrial size was moderately dilated.  4. The mitral valve is normal in structure. Mild to moderate mitral valve regurgitation.  5. The aortic valve is tricuspid. There is mild calcification of the aortic valve. Aortic valve regurgitation is not visualized. Aortic valve sclerosis is present, with no evidence of aortic valve stenosis.  6. The inferior vena cava is dilated in size with <50% respiratory variability, suggesting right atrial pressure of 15 mmHg. Comparison(s): Prior images reviewed side by side. The left ventricular wall motion abnormality is new. Possible LBBB related wall motion artifact is considered, but the apical wall motion is new compared to 2020 and LBBB was present at that time as well. FINDINGS  Left Ventricle: Left ventricular ejection fraction, by estimation, is 55 to 60%. The left ventricle has normal function. The left ventricle demonstrates regional wall motion abnormalities. Mild hypokinesis of the left ventricular, apical septal wall and  anterior wall. The  left ventricular internal cavity size was normal in size. There is borderline concentric left  ventricular hypertrophy. Abnormal (paradoxical) septal motion, consistent with left bundle branch block. Left ventricular diastolic parameters are consistent with Grade I diastolic dysfunction (impaired relaxation).  LV Wall Scoring: The apical septal segment, apical anterior segment, and apex are hypokinetic. Right Ventricle: The right ventricular size is normal. No increase in right ventricular wall thickness. Right ventricular systolic function is normal. There is moderately elevated pulmonary artery systolic pressure. The tricuspid regurgitant velocity is 2.74 m/s, and with an assumed right atrial pressure of 15 mmHg, the estimated right ventricular systolic pressure is 45.0 mmHg. Left Atrium: Left atrial size was moderately dilated. Right Atrium: Right atrial size was normal in size. Pericardium: There is no evidence of pericardial effusion. Mitral Valve: The mitral valve is normal in structure. There is mild thickening of the mitral valve leaflet(s). Mild mitral annular calcification. Mild to moderate mitral valve regurgitation, with eccentric posteriorly directed jet. MV peak gradient, 7.3  mmHg. The mean mitral valve gradient is 3.0 mmHg. Tricuspid Valve: The tricuspid valve is normal in structure. Tricuspid valve regurgitation is mild. Aortic Valve: The aortic valve is tricuspid. There is mild calcification of the aortic valve. Aortic valve regurgitation is not visualized. Aortic valve sclerosis is present, with no evidence of aortic valve stenosis. Aortic valve mean gradient measures 7.0 mmHg. Aortic valve peak gradient measures 13.2 mmHg. Aortic valve area, by VTI measures 2.04 cm. Pulmonic Valve: The pulmonic valve was grossly normal. Pulmonic valve regurgitation is not visualized. Aorta: The aortic root and ascending aorta are structurally normal, with no evidence of dilitation. Venous: The inferior vena cava is dilated in size with less than 50% respiratory variability, suggesting right atrial  pressure of 15 mmHg. IAS/Shunts: No atrial level shunt detected by color flow Doppler.  LEFT VENTRICLE PLAX 2D LVIDd:         4.20 cm     Diastology LVIDs:         3.10 cm     LV e' medial:    3.79 cm/s LV PW:         1.10 cm     LV E/e' medial:  25.1 LV IVS:        1.20 cm     LV e' lateral:   5.96 cm/s LVOT diam:     2.00 cm     LV E/e' lateral: 16.0 LV SV:         67 LV SV Index:   41 LVOT Area:     3.14 cm  LV Volumes (MOD) LV vol d, MOD A2C: 72.8 ml LV vol d, MOD A4C: 84.3 ml LV vol s, MOD A2C: 34.8 ml LV vol s, MOD A4C: 39.3 ml LV SV MOD A2C:     38.0 ml LV SV MOD A4C:     84.3 ml LV SV MOD BP:      41.2 ml RIGHT VENTRICLE             IVC RV S prime:     14.20 cm/s  IVC diam: 2.50 cm TAPSE (M-mode): 1.6 cm LEFT ATRIUM             Index        RIGHT ATRIUM           Index LA diam:        3.90 cm 2.40 cm/m   RA Area:     10.50 cm LA Vol (A2C):  63.5 ml 39.12 ml/m  RA Volume:   19.00 ml  11.71 ml/m LA Vol (A4C):   58.7 ml 36.16 ml/m LA Biplane Vol: 63.1 ml 38.87 ml/m  AORTIC VALVE AV Area (Vmax):    2.01 cm AV Area (Vmean):   2.08 cm AV Area (VTI):     2.04 cm AV Vmax:           181.50 cm/s AV Vmean:          127.500 cm/s AV VTI:            0.328 m AV Peak Grad:      13.2 mmHg AV Mean Grad:      7.0 mmHg LVOT Vmax:         116.00 cm/s LVOT Vmean:        84.500 cm/s LVOT VTI:          0.213 m LVOT/AV VTI ratio: 0.65  AORTA Ao Root diam: 3.40 cm Ao Asc diam:  3.20 cm MITRAL VALVE                TRICUSPID VALVE MV Area (PHT): 4.26 cm     TV Peak grad:   35.8 mmHg MV Area VTI:   2.08 cm     TV Vmax:        2.99 m/s MV Peak grad:  7.3 mmHg     TR Peak grad:   30.0 mmHg MV Mean grad:  3.0 mmHg     TR Vmax:        274.00 cm/s MV Vmax:       1.35 m/s MV Vmean:      79.9 cm/s    SHUNTS MV Decel Time: 178 msec     Systemic VTI:  0.21 m MV E velocity: 95.30 cm/s   Systemic Diam: 2.00 cm MV A velocity: 137.00 cm/s MV E/A ratio:  0.70 Mihai Croitoru MD Electronically signed by Thurmon Fair MD Signature  Date/Time: 03/12/2023/4:47:37 PM    Final    CT CHEST ABDOMEN PELVIS WO CONTRAST  Result Date: 03/11/2023 CLINICAL DATA:  Chest pain, unspecified abdominal pain, diarrhea EXAM: CT CHEST, ABDOMEN AND PELVIS WITHOUT CONTRAST TECHNIQUE: Multidetector CT imaging of the chest, abdomen and pelvis was performed following the standard protocol without IV contrast. RADIATION DOSE REDUCTION: This exam was performed according to the departmental dose-optimization program which includes automated exposure control, adjustment of the mA and/or kV according to patient size and/or use of iterative reconstruction technique. COMPARISON:  None Available. FINDINGS: CT CHEST FINDINGS Cardiovascular: Extensive multi-vessel coronary artery calcification. Status post coronary artery bypass grafting with single, stented saphenous vein graft to the distal right coronary artery. Mild cardiomegaly with left atrial enlargement is stable. No pericardial effusion. Enlargement of the central pulmonary arteries is again seen in keeping with changes of pulmonary arterial hypertension. Moderate atherosclerotic calcification within the thoracic aorta. No aortic aneurysm. Mediastinum/Nodes: No enlarged mediastinal, hilar, or axillary lymph nodes. Thyroid gland, trachea, and esophagus demonstrate no significant findings. Lungs/Pleura: Minimal subpleural pulmonary fibrotic change. Mild emphysema. There is bronchial wall thickening and airway impaction within the lower lobes bilaterally which can be seen in the setting of mucociliary disorders, immunologic deficiencies, or chronic aspiration. Chronic consolidation within the posterior basal right lower lobe is unchanged. No pneumothorax or pleural effusion. Musculoskeletal: The osseous structures are diffusely osteopenic. No acute bone abnormality. No lytic or blastic bone lesion. CT ABDOMEN PELVIS FINDINGS Hepatobiliary: No focal liver abnormality is seen. No gallstones, gallbladder wall thickening,  or biliary dilatation. Pancreas: Unremarkable Spleen: Unremarkable Adrenals/Urinary Tract: The adrenal glands are unremarkable. The kidneys are normal on this noncontrast examination. Small right posterolateral bladder diverticulum noted. The bladder is otherwise unremarkable. Stomach/Bowel: The stomach, small bowel, and large bowel are unremarkable. No evidence of obstruction or focal inflammation. The appendix is not clearly identified, however, there are no secondary signs of appendicitis within the right lower quadrant. No free intraperitoneal gas or fluid. Vascular/Lymphatic: Aortic atherosclerosis. No enlarged abdominal or pelvic lymph nodes. Reproductive: Status post hysterectomy. No adnexal masses. Other: No abdominal wall hernia or abnormality. No abdominopelvic ascites. Musculoskeletal: Osseous structures are diffusely osteopenic. Advanced degenerative changes are seen within the lumbar spine with multilevel degenerative facet ankylosis and stable retrolisthesis and degenerative ankylosis of L1-2 stable severe bilateral neuroforaminal narrowing at L1-2 as a result. No acute bone abnormality. No lytic or blastic bone lesion. IMPRESSION: 1. No acute intrathoracic or intra-abdominal pathology identified. No definite radiographic explanation for the patient's reported symptoms. 2. Extensive multi-vessel coronary artery calcification. Status post single-vessel coronary artery bypass grafting. 3. Morphologic changes in keeping with pulmonary arterial hypertension. 4. Mild emphysema. 5. Bronchial wall thickening and airway impaction within the lower lobes bilaterally which can be seen in the setting of mucociliary disorders, immunologic deficiencies, or chronic aspiration. 6. Stable right basilar chronic consolidation. Aortic Atherosclerosis (ICD10-I70.0) and Emphysema (ICD10-J43.9). Electronically Signed   By: Helyn Numbers M.D.   On: 03/11/2023 22:10   CT Head Wo Contrast  Result Date: 03/11/2023 CLINICAL  DATA:  Confusion, plan to start blood thinners EXAM: CT HEAD WITHOUT CONTRAST TECHNIQUE: Contiguous axial images were obtained from the base of the skull through the vertex without intravenous contrast. RADIATION DOSE REDUCTION: This exam was performed according to the departmental dose-optimization program which includes automated exposure control, adjustment of the mA and/or kV according to patient size and/or use of iterative reconstruction technique. COMPARISON:  CT head 10/09/2022 FINDINGS: Brain: No intracranial hemorrhage, mass effect, or evidence of acute infarct. No hydrocephalus. No extra-axial fluid collection. Generalized cerebral atrophy. Ill-defined hypoattenuation within the cerebral white matter is nonspecific but consistent with chronic small vessel ischemic disease. Vascular: No hyperdense vessel. Intracranial arterial calcification. Skull: No fracture or focal lesion. Sinuses/Orbits: No acute finding. Paranasal sinuses and mastoid air cells are well aerated. Other: None. IMPRESSION: 1. No evidence of acute intracranial abnormality. 2. Chronic small vessel ischemic disease and cerebral atrophy. Electronically Signed   By: Minerva Fester M.D.   On: 03/11/2023 21:57   DG Chest 2 View  Result Date: 03/11/2023 CLINICAL DATA:  Chest pain and diarrhea. EXAM: CHEST - 2 VIEW COMPARISON:  October 08, 2022 FINDINGS: Multiple sternal wires are seen. The heart size and mediastinal contours are within normal limits. There is marked severity calcification of the aorta. Mild, diffuse, chronic appearing increased interstitial lung markings are noted. There is no evidence of focal consolidation, pleural effusion or pneumothorax. Multilevel degenerative changes are noted throughout the thoracic spine. IMPRESSION: 1. Evidence of prior median sternotomy/CABG. 2. Chronic appearing increased interstitial lung markings without acute cardiopulmonary disease. Electronically Signed   By: Aram Candela M.D.   On:  03/11/2023 20:22    Cardiac Studies   TTE 03/12/2023 IMPRESSIONS   1. Left ventricular ejection fraction, by estimation, is 55 to 60%. The  left ventricle has normal function. The left ventricle demonstrates  regional wall motion abnormalities (see scoring diagram/findings for  description). Left ventricular diastolic  parameters are consistent with Grade I diastolic dysfunction (impaired  relaxation).  2. Right ventricular systolic function is normal. The right ventricular  size is normal. There is moderately elevated pulmonary artery systolic  pressure. The estimated right ventricular systolic pressure is 45.0 mmHg.   3. Left atrial size was moderately dilated.   4. The mitral valve is normal in structure. Mild to moderate mitral valve  regurgitation.   5. The aortic valve is tricuspid. There is mild calcification of the  aortic valve. Aortic valve regurgitation is not visualized. Aortic valve  sclerosis is present, with no evidence of aortic valve stenosis.   6. The inferior vena cava is dilated in size with <50% respiratory  variability, suggesting right atrial pressure of 15 mmHg.  Comparison(s): Prior images reviewed side by side. The left ventricular  wall motion abnormality is new. Possible LBBB related wall motion artifact  is considered, but the apical wall motion is new compared to 2020 and LBBB  was present at that time as  well.   Patient Profile     89 y.o. female with a hx of HTN, CAD, HLD, COPD/interstitial lung disease, CKD, iron deficiency anemia, history of tobacco use, Hypothyroidism, GERD.  Assessment & Plan    CAD with elevated troponin, no anginal symptoms  Hypertension  Hyperlipidemia Mild mitral regurgitation   She is hypertensive this morning.  But she has not gotten her antihypertensive medications when I saw her. As noted yesterday would not pursue ischemic evaluation at this time.  Will continue medical management.   For questions or updates,  please contact [facility] Please consult [email or web address] for contact info under Cardiology/STEMI.      Signed, Walta [place], DO  03/13/2023, 8:08 AM

## 2023-03-14 DIAGNOSIS — I2510 Atherosclerotic heart disease of native coronary artery without angina pectoris: Secondary | ICD-10-CM | POA: Diagnosis not present

## 2023-03-14 DIAGNOSIS — G9341 Metabolic encephalopathy: Secondary | ICD-10-CM | POA: Diagnosis not present

## 2023-03-14 DIAGNOSIS — J471 Bronchiectasis with (acute) exacerbation: Secondary | ICD-10-CM | POA: Diagnosis not present

## 2023-03-14 LAB — COMPREHENSIVE METABOLIC PANEL WITH GFR
ALT: 12 U/L (ref 0–44)
AST: 22 U/L (ref 15–41)
Albumin: 3 g/dL — ABNORMAL LOW (ref 3.5–5.0)
Alkaline Phosphatase: 52 U/L (ref 38–126)
Anion gap: 10 (ref 5–15)
BUN: 24 mg/dL — ABNORMAL HIGH (ref 8–23)
CO2: 19 mmol/L — ABNORMAL LOW (ref 22–32)
Calcium: 8.6 mg/dL — ABNORMAL LOW (ref 8.9–10.3)
Chloride: 103 mmol/L (ref 98–111)
Creatinine, Ser: 1.31 mg/dL — ABNORMAL HIGH (ref 0.44–1.00)
GFR, Estimated: 39 mL/min — ABNORMAL LOW
Glucose, Bld: 111 mg/dL — ABNORMAL HIGH (ref 70–99)
Potassium: 4 mmol/L (ref 3.5–5.1)
Sodium: 132 mmol/L — ABNORMAL LOW (ref 135–145)
Total Bilirubin: 0.7 mg/dL (ref 0.3–1.2)
Total Protein: 5.8 g/dL — ABNORMAL LOW (ref 6.5–8.1)

## 2023-03-14 LAB — PHOSPHORUS: Phosphorus: 1.7 mg/dL — ABNORMAL LOW (ref 2.5–4.6)

## 2023-03-14 LAB — CBC WITH DIFFERENTIAL/PLATELET
Abs Immature Granulocytes: 0.05 K/uL (ref 0.00–0.07)
Basophils Absolute: 0 K/uL (ref 0.0–0.1)
Basophils Relative: 0 %
Eosinophils Absolute: 0.2 K/uL (ref 0.0–0.5)
Eosinophils Relative: 2 %
HCT: 29.1 % — ABNORMAL LOW (ref 36.0–46.0)
Hemoglobin: 9.9 g/dL — ABNORMAL LOW (ref 12.0–15.0)
Immature Granulocytes: 1 %
Lymphocytes Relative: 14 %
Lymphs Abs: 1.4 K/uL (ref 0.7–4.0)
MCH: 32.9 pg (ref 26.0–34.0)
MCHC: 34 g/dL (ref 30.0–36.0)
MCV: 96.7 fL (ref 80.0–100.0)
Monocytes Absolute: 0.9 K/uL (ref 0.1–1.0)
Monocytes Relative: 8 %
Neutro Abs: 7.7 K/uL (ref 1.7–7.7)
Neutrophils Relative %: 75 %
Platelets: 132 K/uL — ABNORMAL LOW (ref 150–400)
RBC: 3.01 MIL/uL — ABNORMAL LOW (ref 3.87–5.11)
RDW: 12.6 % (ref 11.5–15.5)
WBC: 10.2 K/uL (ref 4.0–10.5)
nRBC: 0 % (ref 0.0–0.2)

## 2023-03-14 LAB — MAGNESIUM: Magnesium: 1.6 mg/dL — ABNORMAL LOW (ref 1.7–2.4)

## 2023-03-14 MED ORDER — K PHOS MONO-SOD PHOS DI & MONO 155-852-130 MG PO TABS: 250.0000 mg | ORAL_TABLET | Freq: Two times a day (BID) | ORAL | 0 refills | Status: AC

## 2023-03-14 MED ORDER — ALBUTEROL SULFATE HFA 108 (90 BASE) MCG/ACT IN AERS: 1.0000 | INHALATION_SPRAY | RESPIRATORY_TRACT | 2 refills | Status: AC | PRN

## 2023-03-14 MED ORDER — DEXTROMETHORPHAN POLISTIREX ER 30 MG/5ML PO SUER
60.0000 mg | Freq: Two times a day (BID) | ORAL | Status: DC | PRN
  Filled 2023-03-14: qty 10

## 2023-03-14 MED ORDER — MAGNESIUM 200 MG PO TABS: 400.0000 mg | ORAL_TABLET | Freq: Every day | ORAL | 0 refills | Status: AC

## 2023-03-14 MED ORDER — DEXTROMETHORPHAN POLISTIREX ER 30 MG/5ML PO SUER: 60.0000 mg | Freq: Two times a day (BID) | ORAL | 0 refills | Status: DC | PRN

## 2023-03-14 NOTE — Discharge Summary (Signed)
Physician Discharge Summary  Cindy [name] ZOX:[id number] DOB: [birth year] DOA: 03/11/2023  PCP: Daisy Floro, MD  Admit date: 03/11/2023 Discharge date: 03/14/2023  Admitted From: Home Disposition: Home with home health  Recommendations for Outpatient Follow-up:  Follow up with PCP in 1-2 weeks Please obtain BMP/CBC/magnesium/phosphorus in one week   Home Health: PT/OT Equipment/Devices: Available at home  Discharge Condition: Fair CODE STATUS: Full code Diet recommendation: Low-salt diet  Discharge summary: 89 year old with history of chronic kidney disease stage IV, iron-deficiency anemia, interstitial lung disease and COPD, hypertension, hyperlipidemia and chronic diastolic heart failure brought to the emergency room by family with reports of chest pain, diarrhea and also noting more somnolence.  In the emergency room on room air.  Blood pressures elevated.  Troponin 387.  White count normal.  Urinalysis abnormal.  Influenza, RSV and COVID-negative.  CT head without contrast was normal.  Chest x-ray with chronic appearing interstitial markings.  CT chest abdomen pelvis was normal except bronchial wall thickening and airway impaction.  Admitted due to lethargy, bronchitis and extreme weakness.     Assessment & Plan:   Acute metabolic encephalopathy likely secondary to multiple factors including bronchitis. Mental status has improved and normalized. No other organic cause found.  Patient clinically improved now and back to her baseline. She possibly had exacerbated bronchitic symptoms.  Does have evidence of chronic interstitial lung disease on her x-ray. With good clinical recovery, she is going home with PT OT. She will continue to use over-the-counter cough medications, she will continue to use chest physiotherapy including incentive and flutter valve at home. She received 3 days of IV antibiotics.  No indication to continue antibiotics. Patient was prescribed albuterol  inhaler on discharge.   Suspected UTI: Suspected UTI with abnormal urine.  Urine culture and blood cultures were not sent before starting antibiotics.  Patient does not have any urinary symptoms.  Does have history of E. coli UTI in the past.  Patient received 3 days of IV Rocephin.  Will discontinue further antibiotics.   Troponinemia in a patient with known coronary artery disease and prior stents: Troponin elevated but flat, suspected secondary to demand ischemia. Seen by cardiology.  Recommended conservative management. Continue aspirin, atorvastatin, losartan, metoprolol and nitroglycerin and Aldactone.  Cardiology do not suggest invasive cardiac testing.   Hypomagnesemia: Severe and persistent.  Magnesium 0.8 on presentation.  Replaced aggressively.  Still in the lower limit. Magnesium 400 mg daily to continue indefinitely. Hypophosphatemia: Phosphorus level 1.7.  Will discharge with oral phosphorus replacement.  Will need recheck in 1 week.   Diarrhea: Self-limiting.  COPD and pulmonary hypertension: Supportive care.  Nebulizers.  CKD stage IV: At about her baseline.   Discharge Diagnoses:  Active Problems:   Acute metabolic encephalopathy   Bronchiectasis with acute exacerbation   Myocardial injury   Chronic kidney disease, stage 3b (HCC)   Interstitial lung disease (HCC)   Coronary atherosclerosis of native coronary artery   Acquired hypothyroidism   Possible acute cystitis   Dehydration    Discharge Instructions  Discharge Instructions     Diet - low sodium heart healthy   Complete by: As directed    Increase activity slowly   Complete by: As directed       Allergies as of 03/14/2023       Reactions   Codeine Other (See Comments)   Headache from heavy doses of codeine only        Medication List     STOP taking  these medications    guaifenesin 400 MG Tabs tablet Commonly known as: HUMIBID E   spironolactone-hydrochlorothiazide 25-25 MG  tablet Commonly known as: ALDACTAZIDE   TART CHERRY PO   traMADol 50 MG tablet Commonly known as: ULTRAM       TAKE these medications    albuterol 108 (90 Base) MCG/ACT inhaler Commonly known as: VENTOLIN HFA Inhale 1 puff into the lungs every 4 (four) hours as needed for wheezing or shortness of breath.   aspirin 81 MG tablet Take 81 mg by mouth daily.   atorvastatin 80 MG tablet Commonly known as: LIPITOR Take 1 tablet (80 mg total) by mouth daily.   benzonatate 200 MG capsule Commonly known as: TESSALON Take 1 capsule by mouth twice daily as needed for cough What changed:  how much to take how to take this when to take this additional instructions   Biotin 5 MG Caps Take 1 capsule by mouth daily at 6 (six) AM.   Calcium Carbonate-Vitamin D 600-200 MG-UNIT Tabs Take 1 tablet by mouth 2 (two) times daily.   dextromethorphan 30 MG/5ML liquid Commonly known as: DELSYM Take 10 mLs (60 mg total) by mouth 2 (two) times daily as needed for cough.   ferrous sulfate 325 (65 FE) MG tablet Take 325 mg by mouth daily with breakfast.   fexofenadine 180 MG tablet Commonly known as: ALLEGRA Take 1 tablet (180 mg total) by mouth daily.   ICAPS AREDS 2 PO Take 1 capsule by mouth in the morning and at bedtime.   ipratropium 0.06 % nasal spray Commonly known as: ATROVENT Place 2 sprays into both nostrils 3 (three) times daily.   isosorbide mononitrate 30 MG 24 hr tablet Commonly known as: IMDUR Take 90 mg by mouth daily.   levothyroxine 175 MCG tablet Commonly known as: SYNTHROID Take 175 mcg by mouth every evening.   losartan 50 MG tablet Commonly known as: COZAAR Take 50 mg by mouth daily.   Magnesium 200 MG Tabs Take 2 tablets (400 mg total) by mouth daily.   metoprolol succinate 25 MG 24 hr tablet Commonly known as: TOPROL-XL Take 1 tablet (25 mg total) by mouth daily.   montelukast 10 MG tablet Commonly known as: SINGULAIR Take 10 mg by mouth at  bedtime.   NICOTINAMIDE PO Take 1 capsule by mouth 2 (two) times daily.   nitroGLYCERIN 0.4 MG SL tablet Commonly known as: NITROSTAT PLACE 1 TABLET UNDER THE TONGUE EVERY 5 MINUTES AS NEEDED FOR CHEST PAIN What changed: See the new instructions.   pantoprazole 40 MG tablet Commonly known as: PROTONIX Take 40 mg by mouth 2 (two) times daily.   phosphorus 155-852-130 MG tablet Commonly known as: K PHOS NEUTRAL Take 1 tablet (250 mg total) by mouth 2 (two) times daily for 14 days.        Allergies  Allergen Reactions   Codeine Other (See Comments)    Headache from heavy doses of codeine only    Consultations: Cardiology   Procedures/Studies: ECHOCARDIOGRAM COMPLETE  Result Date: 03/12/2023    ECHOCARDIOGRAM REPORT   Patient Name:   OSCAR CASADAY Date of Exam: 03/12/2023 Medical Rec #:  [id number]       Height:       60.0 in Accession #:    [id number]      Weight:       144.0 lb Date of Birth:  04-06-1932       BSA:  1.623 m Patient Age:    89 years        BP:           144/43 mmHg Patient Gender: F               HR:           134 bpm. Exam Location:  Inpatient Procedure: 2D Echo, Cardiac Doppler and Color Doppler Indications:    Elevated Troponin  History:        Patient has prior history of Echocardiogram examinations, most                 recent 11/27/2018. CHF, hx myocaridal injury, Previous Myocardial                 Infarction and CAD, Prior CABG, Carotid Disease, COPD and CKD,                 interstitial lung disease, hx cancer, Signs/Symptoms:Chest Pain;                 Risk Factors:Former Smoker, Dyslipidemia and Hypertension.  Sonographer:    Wallie Char Referring Phys: [id number] Jonita Albee IMPRESSIONS  1. Left ventricular ejection fraction, by estimation, is 55 to 60%. The left ventricle has normal function. The left ventricle demonstrates regional wall motion abnormalities (see scoring diagram/findings for description). Left ventricular diastolic parameters  are consistent with Grade I diastolic dysfunction (impaired relaxation).  2. Right ventricular systolic function is normal. The right ventricular size is normal. There is moderately elevated pulmonary artery systolic pressure. The estimated right ventricular systolic pressure is 45.0 mmHg.  3. Left atrial size was moderately dilated.  4. The mitral valve is normal in structure. Mild to moderate mitral valve regurgitation.  5. The aortic valve is tricuspid. There is mild calcification of the aortic valve. Aortic valve regurgitation is not visualized. Aortic valve sclerosis is present, with no evidence of aortic valve stenosis.  6. The inferior vena cava is dilated in size with <50% respiratory variability, suggesting right atrial pressure of 15 mmHg. Comparison(s): Prior images reviewed side by side. The left ventricular wall motion abnormality is new. Possible LBBB related wall motion artifact is considered, but the apical wall motion is new compared to 2020 and LBBB was present at that time as well. FINDINGS  Left Ventricle: Left ventricular ejection fraction, by estimation, is 55 to 60%. The left ventricle has normal function. The left ventricle demonstrates regional wall motion abnormalities. Mild hypokinesis of the left ventricular, apical septal wall and  anterior wall. The left ventricular internal cavity size was normal in size. There is borderline concentric left ventricular hypertrophy. Abnormal (paradoxical) septal motion, consistent with left bundle branch block. Left ventricular diastolic parameters are consistent with Grade I diastolic dysfunction (impaired relaxation).  LV Wall Scoring: The apical septal segment, apical anterior segment, and apex are hypokinetic. Right Ventricle: The right ventricular size is normal. No increase in right ventricular wall thickness. Right ventricular systolic function is normal. There is moderately elevated pulmonary artery systolic pressure. The tricuspid regurgitant  velocity is 2.74 m/s, and with an assumed right atrial pressure of 15 mmHg, the estimated right ventricular systolic pressure is 45.0 mmHg. Left Atrium: Left atrial size was moderately dilated. Right Atrium: Right atrial size was normal in size. Pericardium: There is no evidence of pericardial effusion. Mitral Valve: The mitral valve is normal in structure. There is mild thickening of the mitral valve leaflet(s). Mild mitral annular calcification. Mild to moderate  mitral valve regurgitation, with eccentric posteriorly directed jet. MV peak gradient, 7.3  mmHg. The mean mitral valve gradient is 3.0 mmHg. Tricuspid Valve: The tricuspid valve is normal in structure. Tricuspid valve regurgitation is mild. Aortic Valve: The aortic valve is tricuspid. There is mild calcification of the aortic valve. Aortic valve regurgitation is not visualized. Aortic valve sclerosis is present, with no evidence of aortic valve stenosis. Aortic valve mean gradient measures 7.0 mmHg. Aortic valve peak gradient measures 13.2 mmHg. Aortic valve area, by VTI measures 2.04 cm. Pulmonic Valve: The pulmonic valve was grossly normal. Pulmonic valve regurgitation is not visualized. Aorta: The aortic root and ascending aorta are structurally normal, with no evidence of dilitation. Venous: The inferior vena cava is dilated in size with less than 50% respiratory variability, suggesting right atrial pressure of 15 mmHg. IAS/Shunts: No atrial level shunt detected by color flow Doppler.  LEFT VENTRICLE PLAX 2D LVIDd:         4.20 cm     Diastology LVIDs:         3.10 cm     LV e' medial:    3.79 cm/s LV PW:         1.10 cm     LV E/e' medial:  25.1 LV IVS:        1.20 cm     LV e' lateral:   5.96 cm/s LVOT diam:     2.00 cm     LV E/e' lateral: 16.0 LV SV:         67 LV SV Index:   41 LVOT Area:     3.14 cm  LV Volumes (MOD) LV vol d, MOD A2C: 72.8 ml LV vol d, MOD A4C: 84.3 ml LV vol s, MOD A2C: 34.8 ml LV vol s, MOD A4C: 39.3 ml LV SV MOD A2C:      38.0 ml LV SV MOD A4C:     84.3 ml LV SV MOD BP:      41.2 ml RIGHT VENTRICLE             IVC RV S prime:     14.20 cm/s  IVC diam: 2.50 cm TAPSE (M-mode): 1.6 cm LEFT ATRIUM             Index        RIGHT ATRIUM           Index LA diam:        3.90 cm 2.40 cm/m   RA Area:     10.50 cm LA Vol (A2C):   63.5 ml 39.12 ml/m  RA Volume:   19.00 ml  11.71 ml/m LA Vol (A4C):   58.7 ml 36.16 ml/m LA Biplane Vol: 63.1 ml 38.87 ml/m  AORTIC VALVE AV Area (Vmax):    2.01 cm AV Area (Vmean):   2.08 cm AV Area (VTI):     2.04 cm AV Vmax:           181.50 cm/s AV Vmean:          127.500 cm/s AV VTI:            0.328 m AV Peak Grad:      13.2 mmHg AV Mean Grad:      7.0 mmHg LVOT Vmax:         116.00 cm/s LVOT Vmean:        84.500 cm/s LVOT VTI:          0.213 m LVOT/AV VTI ratio: 0.65  AORTA Ao Root diam: 3.40 cm Ao Asc diam:  3.20 cm MITRAL VALVE                TRICUSPID VALVE MV Area (PHT): 4.26 cm     TV Peak grad:   35.8 mmHg MV Area VTI:   2.08 cm     TV Vmax:        2.99 m/s MV Peak grad:  7.3 mmHg     TR Peak grad:   30.0 mmHg MV Mean grad:  3.0 mmHg     TR Vmax:        274.00 cm/s MV Vmax:       1.35 m/s MV Vmean:      79.9 cm/s    SHUNTS MV Decel Time: 178 msec     Systemic VTI:  0.21 m MV E velocity: 95.30 cm/s   Systemic Diam: 2.00 cm MV A velocity: 137.00 cm/s MV E/A ratio:  0.70 Mihai Croitoru MD Electronically signed by Thurmon Fair MD Signature Date/Time: 03/12/2023/4:47:37 PM    Final    CT CHEST ABDOMEN PELVIS WO CONTRAST  Result Date: 03/11/2023 CLINICAL DATA:  Chest pain, unspecified abdominal pain, diarrhea EXAM: CT CHEST, ABDOMEN AND PELVIS WITHOUT CONTRAST TECHNIQUE: Multidetector CT imaging of the chest, abdomen and pelvis was performed following the standard protocol without IV contrast. RADIATION DOSE REDUCTION: This exam was performed according to the departmental dose-optimization program which includes automated exposure control, adjustment of the mA and/or kV according to patient size  and/or use of iterative reconstruction technique. COMPARISON:  None Available. FINDINGS: CT CHEST FINDINGS Cardiovascular: Extensive multi-vessel coronary artery calcification. Status post coronary artery bypass grafting with single, stented saphenous vein graft to the distal right coronary artery. Mild cardiomegaly with left atrial enlargement is stable. No pericardial effusion. Enlargement of the central pulmonary arteries is again seen in keeping with changes of pulmonary arterial hypertension. Moderate atherosclerotic calcification within the thoracic aorta. No aortic aneurysm. Mediastinum/Nodes: No enlarged mediastinal, hilar, or axillary lymph nodes. Thyroid gland, trachea, and esophagus demonstrate no significant findings. Lungs/Pleura: Minimal subpleural pulmonary fibrotic change. Mild emphysema. There is bronchial wall thickening and airway impaction within the lower lobes bilaterally which can be seen in the setting of mucociliary disorders, immunologic deficiencies, or chronic aspiration. Chronic consolidation within the posterior basal right lower lobe is unchanged. No pneumothorax or pleural effusion. Musculoskeletal: The osseous structures are diffusely osteopenic. No acute bone abnormality. No lytic or blastic bone lesion. CT ABDOMEN PELVIS FINDINGS Hepatobiliary: No focal liver abnormality is seen. No gallstones, gallbladder wall thickening, or biliary dilatation. Pancreas: Unremarkable Spleen: Unremarkable Adrenals/Urinary Tract: The adrenal glands are unremarkable. The kidneys are normal on this noncontrast examination. Small right posterolateral bladder diverticulum noted. The bladder is otherwise unremarkable. Stomach/Bowel: The stomach, small bowel, and large bowel are unremarkable. No evidence of obstruction or focal inflammation. The appendix is not clearly identified, however, there are no secondary signs of appendicitis within the right lower quadrant. No free intraperitoneal gas or fluid.  Vascular/Lymphatic: Aortic atherosclerosis. No enlarged abdominal or pelvic lymph nodes. Reproductive: Status post hysterectomy. No adnexal masses. Other: No abdominal wall hernia or abnormality. No abdominopelvic ascites. Musculoskeletal: Osseous structures are diffusely osteopenic. Advanced degenerative changes are seen within the lumbar spine with multilevel degenerative facet ankylosis and stable retrolisthesis and degenerative ankylosis of L1-2 stable severe bilateral neuroforaminal narrowing at L1-2 as a result. No acute bone abnormality. No lytic or blastic bone lesion. IMPRESSION: 1. No acute intrathoracic or intra-abdominal pathology identified. No  definite radiographic explanation for the patient's reported symptoms. 2. Extensive multi-vessel coronary artery calcification. Status post single-vessel coronary artery bypass grafting. 3. Morphologic changes in keeping with pulmonary arterial hypertension. 4. Mild emphysema. 5. Bronchial wall thickening and airway impaction within the lower lobes bilaterally which can be seen in the setting of mucociliary disorders, immunologic deficiencies, or chronic aspiration. 6. Stable right basilar chronic consolidation. Aortic Atherosclerosis (ICD10-I70.0) and Emphysema (ICD10-J43.9). Electronically Signed   By: Helyn Numbers M.D.   On: 03/11/2023 22:10   CT Head Wo Contrast  Result Date: 03/11/2023 CLINICAL DATA:  Confusion, plan to start blood thinners EXAM: CT HEAD WITHOUT CONTRAST TECHNIQUE: Contiguous axial images were obtained from the base of the skull through the vertex without intravenous contrast. RADIATION DOSE REDUCTION: This exam was performed according to the departmental dose-optimization program which includes automated exposure control, adjustment of the mA and/or kV according to patient size and/or use of iterative reconstruction technique. COMPARISON:  CT head 10/09/2022 FINDINGS: Brain: No intracranial hemorrhage, mass effect, or evidence of  acute infarct. No hydrocephalus. No extra-axial fluid collection. Generalized cerebral atrophy. Ill-defined hypoattenuation within the cerebral white matter is nonspecific but consistent with chronic small vessel ischemic disease. Vascular: No hyperdense vessel. Intracranial arterial calcification. Skull: No fracture or focal lesion. Sinuses/Orbits: No acute finding. Paranasal sinuses and mastoid air cells are well aerated. Other: None. IMPRESSION: 1. No evidence of acute intracranial abnormality. 2. Chronic small vessel ischemic disease and cerebral atrophy. Electronically Signed   By: Minerva Fester M.D.   On: 03/11/2023 21:57   DG Chest 2 View  Result Date: 03/11/2023 CLINICAL DATA:  Chest pain and diarrhea. EXAM: CHEST - 2 VIEW COMPARISON:  October 08, 2022 FINDINGS: Multiple sternal wires are seen. The heart size and mediastinal contours are within normal limits. There is marked severity calcification of the aorta. Mild, diffuse, chronic appearing increased interstitial lung markings are noted. There is no evidence of focal consolidation, pleural effusion or pneumothorax. Multilevel degenerative changes are noted throughout the thoracic spine. IMPRESSION: 1. Evidence of prior median sternotomy/CABG. 2. Chronic appearing increased interstitial lung markings without acute cardiopulmonary disease. Electronically Signed   By: Aram Candela M.D.   On: 03/11/2023 20:22   (Echo, Carotid, EGD, Colonoscopy, ERCP)    Subjective: Patient seen in the morning rounds.  Walking to the sink in the bathroom with rolling walker.  Denies any complaints.  Family at the bedside.  Eager to go home.   Discharge Exam: Vitals:   03/13/23 2035 03/14/23 0445  BP: (!) 159/53 (!) 140/48  Pulse: 72 82  Resp: 17 16  Temp: 98.6 F (37 C) 98.8 F (37.1 C)  SpO2: 95% 96%   Vitals:   03/13/23 0723 03/13/23 1332 03/13/23 2035 03/14/23 0445  BP: (!) 154/54 (!) 130/42 (!) 159/53 (!) 140/48  Pulse: 76 73 72 82  Resp:  18 18 17 16   Temp: 99.2 F (37.3 C) 98.7 F (37.1 C) 98.6 F (37 C) 98.8 F (37.1 C)  TempSrc: Oral Oral Oral Oral  SpO2: 98% 97% 95% 96%  Weight:    64.5 kg  Height:        General: Pt is alert, awake, not in acute distress Age-appropriate.  Frail. Looks comfortable on room air.  Mobilizing around with a walker. Patient does have ecchymosis on the posterior aspect of the left arm due to IV infiltration. Cardiovascular: RRR, S1/S2 +, no rubs, no gallops Respiratory: CTA bilaterally, no wheezing, no rhonchi Abdominal: Soft, NT, ND, bowel  sounds + Extremities: no edema, no cyanosis    The results of significant diagnostics from this hospitalization (including imaging, microbiology, ancillary and laboratory) are listed below for reference.     Microbiology: Recent Results (from the past 240 hour(s))  Resp panel by RT-PCR (RSV, Flu A&B, Covid) Anterior Nasal Swab     Status: None   Collection Time: 03/11/23 10:17 PM   Specimen: Anterior Nasal Swab  Result Value Ref Range Status   SARS Coronavirus 2 by RT PCR NEGATIVE NEGATIVE Final    Comment: (NOTE) SARS-CoV-2 target nucleic acids are NOT DETECTED.  The SARS-CoV-2 RNA is generally detectable in upper respiratory specimens during the acute phase of infection. The lowest concentration of SARS-CoV-2 viral copies this assay can detect is 138 copies/mL. A negative result does not preclude SARS-Cov-2 infection and should not be used as the sole basis for treatment or other patient management decisions. A negative result may occur with  improper specimen collection/handling, submission of specimen other than nasopharyngeal swab, presence of viral mutation(s) within the areas targeted by this assay, and inadequate number of viral copies(<138 copies/mL). A negative result must be combined with clinical observations, patient history, and epidemiological information. The expected result is Negative.  Fact Sheet for Patients:   BloggerCourse.com  Fact Sheet for Healthcare Providers:  SeriousBroker.it  This test is no t yet approved or cleared by the [place] FDA and  has been authorized for detection and/or diagnosis of SARS-CoV-2 by FDA under an Emergency Use Authorization (EUA). This EUA will remain  in effect (meaning this test can be used) for the duration of the COVID-19 declaration under Section 564(b)(1) of the Act, 21 U.S.C.section 360bbb-3(b)(1), unless the authorization is terminated  or revoked sooner.       Influenza A by PCR NEGATIVE NEGATIVE Final   Influenza B by PCR NEGATIVE NEGATIVE Final    Comment: (NOTE) The Xpert Xpress SARS-CoV-2/FLU/RSV plus assay is intended as an aid in the diagnosis of influenza from Nasopharyngeal swab specimens and should not be used as a sole basis for treatment. Nasal washings and aspirates are unacceptable for Xpert Xpress SARS-CoV-2/FLU/RSV testing.  Fact Sheet for Patients: BloggerCourse.com  Fact Sheet for Healthcare Providers: SeriousBroker.it  This test is not yet approved or cleared by the [place] FDA and has been authorized for detection and/or diagnosis of SARS-CoV-2 by FDA under an Emergency Use Authorization (EUA). This EUA will remain in effect (meaning this test can be used) for the duration of the COVID-19 declaration under Section 564(b)(1) of the Act, 21 U.S.C. section 360bbb-3(b)(1), unless the authorization is terminated or revoked.     Resp Syncytial Virus by PCR NEGATIVE NEGATIVE Final    Comment: (NOTE) Fact Sheet for Patients: BloggerCourse.com  Fact Sheet for Healthcare Providers: SeriousBroker.it  This test is not yet approved or cleared by the [place] FDA and has been authorized for detection and/or diagnosis of SARS-CoV-2 by FDA under an Emergency Use  Authorization (EUA). This EUA will remain in effect (meaning this test can be used) for the duration of the COVID-19 declaration under Section 564(b)(1) of the Act, 21 U.S.C. section 360bbb-3(b)(1), unless the authorization is terminated or revoked.  Performed at [facility], [address], [place], [place] [address]      Labs: BNP (last 3 results) Recent Labs    10/09/22 0654 03/11/23 1947  BNP 718.5* 504.3*   Basic Metabolic Panel: Recent Labs  Lab 03/11/23 1937 03/12/23 6045 03/12/23 2218 03/13/23 0152 03/14/23  0256  NA 138  --  136 134* 132*  K 4.2  --  3.8 3.7 4.0  CL 103  --  102 104 103  CO2 22  --  21* 20* 19*  GLUCOSE 147*  --  121* 112* 111*  BUN 35*  --  26* 27* 24*  CREATININE 1.39* 1.35* 1.52* 1.44* 1.31*  CALCIUM 10.4*  --  9.5 9.3 8.6*  MG  --   --  0.8* 1.6* 1.6*  PHOS  --   --   --   --  1.7*   Liver Function Tests: Recent Labs  Lab 03/11/23 1937 03/12/23 2218 03/14/23 0256  AST 22 21 22   ALT 8 11 12   ALKPHOS 50 48 52  BILITOT 0.7 1.2 0.7  PROT 7.4 6.0* 5.8*  ALBUMIN 4.7 3.2* 3.0*   Recent Labs  Lab 03/11/23 1937  LIPASE 25   No results for input(s): "AMMONIA" in the last 168 hours. CBC: Recent Labs  Lab 03/11/23 1937 03/12/23 0838 03/12/23 2218 03/13/23 0152 03/14/23 0256  WBC 10.2 12.6* 12.0* 12.2* 10.2  NEUTROABS  --   --  9.3*  --  7.7  HGB 12.0 11.2* 10.3* 10.7* 9.9*  HCT 35.0* 32.8* 29.9* 31.4* 29.1*  MCV 97.5 97.3 97.1 97.5 96.7  PLT 143* 136* 133* 125* 132*   Cardiac Enzymes: No results for input(s): "CKTOTAL", "CKMB", "CKMBINDEX", "TROPONINI" in the last 168 hours. BNP: Invalid input(s): "POCBNP" CBG: No results for input(s): "GLUCAP" in the last 168 hours. D-Dimer No results for input(s): "DDIMER" in the last 72 hours. Hgb A1c No results for input(s): "HGBA1C" in the last 72 hours. Lipid Profile No results for input(s): "CHOL", "HDL", "LDLCALC", "TRIG", "CHOLHDL", "LDLDIRECT" in the  last 72 hours. Thyroid function studies No results for input(s): "TSH", "T4TOTAL", "T3FREE", "THYROIDAB" in the last 72 hours.  Invalid input(s): "FREET3" Anemia work up No results for input(s): "VITAMINB12", "FOLATE", "FERRITIN", "TIBC", "IRON", "RETICCTPCT" in the last 72 hours. Urinalysis    Component Value Date/Time   COLORURINE YELLOW 03/11/2023 1938   APPEARANCEUR CLEAR 03/11/2023 1938   LABSPEC 1.014 03/11/2023 1938   PHURINE 5.5 03/11/2023 1938   GLUCOSEU NEGATIVE 03/11/2023 1938   HGBUR NEGATIVE 03/11/2023 1938   BILIRUBINUR NEGATIVE 03/11/2023 1938   KETONESUR NEGATIVE 03/11/2023 1938   PROTEINUR TRACE (A) 03/11/2023 1938   NITRITE POSITIVE (A) 03/11/2023 1938   LEUKOCYTESUR SMALL (A) 03/11/2023 1938   Sepsis Labs Recent Labs  Lab 03/12/23 0838 03/12/23 2218 03/13/23 0152 03/14/23 0256  WBC 12.6* 12.0* 12.2* 10.2   Microbiology Recent Results (from the past 240 hour(s))  Resp panel by RT-PCR (RSV, Flu A&B, Covid) Anterior Nasal Swab     Status: None   Collection Time: 03/11/23 10:17 PM   Specimen: Anterior Nasal Swab  Result Value Ref Range Status   SARS Coronavirus 2 by RT PCR NEGATIVE NEGATIVE Final    Comment: (NOTE) SARS-CoV-2 target nucleic acids are NOT DETECTED.  The SARS-CoV-2 RNA is generally detectable in upper respiratory specimens during the acute phase of infection. The lowest concentration of SARS-CoV-2 viral copies this assay can detect is 138 copies/mL. A negative result does not preclude SARS-Cov-2 infection and should not be used as the sole basis for treatment or other patient management decisions. A negative result may occur with  improper specimen collection/handling, submission of specimen other than nasopharyngeal swab, presence of viral mutation(s) within the areas targeted by this assay, and inadequate number of viral copies(<138 copies/mL). A negative result  must be combined with clinical observations, patient history, and  epidemiological information. The expected result is Negative.  Fact Sheet for Patients:  BloggerCourse.com  Fact Sheet for Healthcare Providers:  SeriousBroker.it  This test is no t yet approved or cleared by the [place] FDA and  has been authorized for detection and/or diagnosis of SARS-CoV-2 by FDA under an Emergency Use Authorization (EUA). This EUA will remain  in effect (meaning this test can be used) for the duration of the COVID-19 declaration under Section 564(b)(1) of the Act, 21 U.S.C.section 360bbb-3(b)(1), unless the authorization is terminated  or revoked sooner.       Influenza A by PCR NEGATIVE NEGATIVE Final   Influenza B by PCR NEGATIVE NEGATIVE Final    Comment: (NOTE) The Xpert Xpress SARS-CoV-2/FLU/RSV plus assay is intended as an aid in the diagnosis of influenza from Nasopharyngeal swab specimens and should not be used as a sole basis for treatment. Nasal washings and aspirates are unacceptable for Xpert Xpress SARS-CoV-2/FLU/RSV testing.  Fact Sheet for Patients: BloggerCourse.com  Fact Sheet for Healthcare Providers: SeriousBroker.it  This test is not yet approved or cleared by the [place] FDA and has been authorized for detection and/or diagnosis of SARS-CoV-2 by FDA under an Emergency Use Authorization (EUA). This EUA will remain in effect (meaning this test can be used) for the duration of the COVID-19 declaration under Section 564(b)(1) of the Act, 21 U.S.C. section 360bbb-3(b)(1), unless the authorization is terminated or revoked.     Resp Syncytial Virus by PCR NEGATIVE NEGATIVE Final    Comment: (NOTE) Fact Sheet for Patients: BloggerCourse.com  Fact Sheet for Healthcare Providers: SeriousBroker.it  This test is not yet approved or cleared by the [place] FDA and has been  authorized for detection and/or diagnosis of SARS-CoV-2 by FDA under an Emergency Use Authorization (EUA). This EUA will remain in effect (meaning this test can be used) for the duration of the COVID-19 declaration under Section 564(b)(1) of the Act, 21 U.S.C. section 360bbb-3(b)(1), unless the authorization is terminated or revoked.  Performed at [facility], [address] [place], [place] [address]      Time coordinating discharge: 35 minutes  SIGNED:   Dorcas Carrow, MD  [facility] 03/14/2023, 10:16 AM

## 2023-03-14 NOTE — Progress Notes (Signed)
Progress Note  Patient Name: Cindy [name] Date of Encounter: 03/14/2023  Primary Cardiologist: Donato Stay, MD   Subjective   Patient seen examined her bedside patient awake when I arrived.  No specific complaints  Inpatient Medications    Scheduled Meds:  aspirin  81 mg Oral Daily   atorvastatin  80 mg Oral Daily   enoxaparin (LOVENOX) injection  30 mg Subcutaneous Q24H   ferrous sulfate  325 mg Oral Q breakfast   levothyroxine  175 mcg Oral QPM   losartan  50 mg Oral Daily   metoprolol succinate  25 mg Oral Daily   nitroGLYCERIN  0.5 inch Topical Q6H   pantoprazole  40 mg Oral BID   sodium chloride flush  3 mL Intravenous Q12H   Continuous Infusions:  cefTRIAXone (ROCEPHIN)  IV 1 g (03/14/23 0914)   PRN Meds: acetaminophen **OR** acetaminophen, albuterol, dextromethorphan, ipratropium-albuterol, ondansetron **OR** ondansetron (ZOFRAN) IV, sodium chloride flush   Vital Signs    Vitals:   03/13/23 0723 03/13/23 1332 03/13/23 2035 03/14/23 0445  BP: (!) 154/54 (!) 130/42 (!) 159/53 (!) 140/48  Pulse: 76 73 72 82  Resp: 18 18 17 16   Temp: 99.2 F (37.3 C) 98.7 F (37.1 C) 98.6 F (37 C) 98.8 F (37.1 C)  TempSrc: Oral Oral Oral Oral  SpO2: 98% 97% 95% 96%  Weight:    64.5 kg  Height:       No intake or output data in the 24 hours ending 03/14/23 0959  Filed Weights   03/11/23 1935 03/13/23 0500 03/14/23 0445  Weight: 65.3 kg 64 kg 64.5 kg    Telemetry    Sinus rhythm- Personally Reviewed  ECG    None- Personally Reviewed  Physical Exam    General: Comfortable,  Head: Atraumatic, normal size  Eyes: PEERLA, EOMI  Neck: Supple, normal JVD Cardiac: Normal S1, S2; RRR; no murmurs, rubs, or gallops Lungs: Clear to auscultation bilaterally Abd: Soft, nontender, no hepatomegaly  Ext: warm, no edema Musculoskeletal: No deformities, BUE and BLE strength normal and equal Skin: Warm and dry, no rashes   Neuro: Alert and oriented to person, place,  time, and situation, CNII-XII grossly intact, no focal deficits  Psych: Normal mood and affect   Labs    Chemistry Recent Labs  Lab 03/11/23 1937 03/12/23 0838 03/12/23 2218 03/13/23 0152 03/14/23 0256  NA 138  --  136 134* 132*  K 4.2  --  3.8 3.7 4.0  CL 103  --  102 104 103  CO2 22  --  21* 20* 19*  GLUCOSE 147*  --  121* 112* 111*  BUN 35*  --  26* 27* 24*  CREATININE 1.39*   < > 1.52* 1.44* 1.31*  CALCIUM 10.4*  --  9.5 9.3 8.6*  PROT 7.4  --  6.0*  --  5.8*  ALBUMIN 4.7  --  3.2*  --  3.0*  AST 22  --  21  --  22  ALT 8  --  11  --  12  ALKPHOS 50  --  48  --  52  BILITOT 0.7  --  1.2  --  0.7  GFRNONAA 36*   < > 33* 35* 39*  ANIONGAP 13  --  13 10 10    < > = values in this interval not displayed.     Hematology Recent Labs  Lab 03/12/23 2218 03/13/23 0152 03/14/23 0256  WBC 12.0* 12.2* 10.2  RBC 3.08* 3.22*  3.01*  HGB 10.3* 10.7* 9.9*  HCT 29.9* 31.4* 29.1*  MCV 97.1 97.5 96.7  MCH 33.4 33.2 32.9  MCHC 34.4 34.1 34.0  RDW 13.0 13.0 12.6  PLT 133* 125* 132*    Cardiac EnzymesNo results for input(s): "TROPONINI" in the last 168 hours. No results for input(s): "TROPIPOC" in the last 168 hours.   BNP Recent Labs  Lab 03/11/23 1947  BNP 504.3*     DDimer No results for input(s): "DDIMER" in the last 168 hours.   Radiology    ECHOCARDIOGRAM COMPLETE  Result Date: 03/12/2023    ECHOCARDIOGRAM REPORT   Patient Name:   Cindy [name] Date of Exam: 03/12/2023 Medical Rec #:  [id number]       Height:       60.0 in Accession #:    [id number]      Weight:       144.0 lb Date of Birth:  02-May-1932       BSA:          1.623 m Patient Age:    89 years        BP:           144/43 mmHg Patient Gender: F               HR:           134 bpm. Exam Location:  Inpatient Procedure: 2D Echo, Cardiac Doppler and Color Doppler Indications:    Elevated Troponin  History:        Patient has prior history of Echocardiogram examinations, most                 recent 11/27/2018.  CHF, hx myocaridal injury, Previous Myocardial                 Infarction and CAD, Prior CABG, Carotid Disease, COPD and CKD,                 interstitial lung disease, hx cancer, Signs/Symptoms:Chest Pain;                 Risk Factors:Former Smoker, Dyslipidemia and Hypertension.  Sonographer:    Wallie Char Referring Phys: [id number] Jonita Albee IMPRESSIONS  1. Left ventricular ejection fraction, by estimation, is 55 to 60%. The left ventricle has normal function. The left ventricle demonstrates regional wall motion abnormalities (see scoring diagram/findings for description). Left ventricular diastolic parameters are consistent with Grade I diastolic dysfunction (impaired relaxation).  2. Right ventricular systolic function is normal. The right ventricular size is normal. There is moderately elevated pulmonary artery systolic pressure. The estimated right ventricular systolic pressure is 45.0 mmHg.  3. Left atrial size was moderately dilated.  4. The mitral valve is normal in structure. Mild to moderate mitral valve regurgitation.  5. The aortic valve is tricuspid. There is mild calcification of the aortic valve. Aortic valve regurgitation is not visualized. Aortic valve sclerosis is present, with no evidence of aortic valve stenosis.  6. The inferior vena cava is dilated in size with <50% respiratory variability, suggesting right atrial pressure of 15 mmHg. Comparison(s): Prior images reviewed side by side. The left ventricular wall motion abnormality is new. Possible LBBB related wall motion artifact is considered, but the apical wall motion is new compared to 2020 and LBBB was present at that time as well. FINDINGS  Left Ventricle: Left ventricular ejection fraction, by estimation, is 55 to 60%. The left ventricle has normal function. The left ventricle demonstrates  regional wall motion abnormalities. Mild hypokinesis of the left ventricular, apical septal wall and  anterior wall. The left ventricular  internal cavity size was normal in size. There is borderline concentric left ventricular hypertrophy. Abnormal (paradoxical) septal motion, consistent with left bundle branch block. Left ventricular diastolic parameters are consistent with Grade I diastolic dysfunction (impaired relaxation).  LV Wall Scoring: The apical septal segment, apical anterior segment, and apex are hypokinetic. Right Ventricle: The right ventricular size is normal. No increase in right ventricular wall thickness. Right ventricular systolic function is normal. There is moderately elevated pulmonary artery systolic pressure. The tricuspid regurgitant velocity is 2.74 m/s, and with an assumed right atrial pressure of 15 mmHg, the estimated right ventricular systolic pressure is 45.0 mmHg. Left Atrium: Left atrial size was moderately dilated. Right Atrium: Right atrial size was normal in size. Pericardium: There is no evidence of pericardial effusion. Mitral Valve: The mitral valve is normal in structure. There is mild thickening of the mitral valve leaflet(s). Mild mitral annular calcification. Mild to moderate mitral valve regurgitation, with eccentric posteriorly directed jet. MV peak gradient, 7.3  mmHg. The mean mitral valve gradient is 3.0 mmHg. Tricuspid Valve: The tricuspid valve is normal in structure. Tricuspid valve regurgitation is mild. Aortic Valve: The aortic valve is tricuspid. There is mild calcification of the aortic valve. Aortic valve regurgitation is not visualized. Aortic valve sclerosis is present, with no evidence of aortic valve stenosis. Aortic valve mean gradient measures 7.0 mmHg. Aortic valve peak gradient measures 13.2 mmHg. Aortic valve area, by VTI measures 2.04 cm. Pulmonic Valve: The pulmonic valve was grossly normal. Pulmonic valve regurgitation is not visualized. Aorta: The aortic root and ascending aorta are structurally normal, with no evidence of dilitation. Venous: The inferior vena cava is dilated in  size with less than 50% respiratory variability, suggesting right atrial pressure of 15 mmHg. IAS/Shunts: No atrial level shunt detected by color flow Doppler.  LEFT VENTRICLE PLAX 2D LVIDd:         4.20 cm     Diastology LVIDs:         3.10 cm     LV e' medial:    3.79 cm/s LV PW:         1.10 cm     LV E/e' medial:  25.1 LV IVS:        1.20 cm     LV e' lateral:   5.96 cm/s LVOT diam:     2.00 cm     LV E/e' lateral: 16.0 LV SV:         67 LV SV Index:   41 LVOT Area:     3.14 cm  LV Volumes (MOD) LV vol d, MOD A2C: 72.8 ml LV vol d, MOD A4C: 84.3 ml LV vol s, MOD A2C: 34.8 ml LV vol s, MOD A4C: 39.3 ml LV SV MOD A2C:     38.0 ml LV SV MOD A4C:     84.3 ml LV SV MOD BP:      41.2 ml RIGHT VENTRICLE             IVC RV S prime:     14.20 cm/s  IVC diam: 2.50 cm TAPSE (M-mode): 1.6 cm LEFT ATRIUM             Index        RIGHT ATRIUM           Index LA diam:        3.90  cm 2.40 cm/m   RA Area:     10.50 cm LA Vol (A2C):   63.5 ml 39.12 ml/m  RA Volume:   19.00 ml  11.71 ml/m LA Vol (A4C):   58.7 ml 36.16 ml/m LA Biplane Vol: 63.1 ml 38.87 ml/m  AORTIC VALVE AV Area (Vmax):    2.01 cm AV Area (Vmean):   2.08 cm AV Area (VTI):     2.04 cm AV Vmax:           181.50 cm/s AV Vmean:          127.500 cm/s AV VTI:            0.328 m AV Peak Grad:      13.2 mmHg AV Mean Grad:      7.0 mmHg LVOT Vmax:         116.00 cm/s LVOT Vmean:        84.500 cm/s LVOT VTI:          0.213 m LVOT/AV VTI ratio: 0.65  AORTA Ao Root diam: 3.40 cm Ao Asc diam:  3.20 cm MITRAL VALVE                TRICUSPID VALVE MV Area (PHT): 4.26 cm     TV Peak grad:   35.8 mmHg MV Area VTI:   2.08 cm     TV Vmax:        2.99 m/s MV Peak grad:  7.3 mmHg     TR Peak grad:   30.0 mmHg MV Mean grad:  3.0 mmHg     TR Vmax:        274.00 cm/s MV Vmax:       1.35 m/s MV Vmean:      79.9 cm/s    SHUNTS MV Decel Time: 178 msec     Systemic VTI:  0.21 m MV E velocity: 95.30 cm/s   Systemic Diam: 2.00 cm MV A velocity: 137.00 cm/s MV E/A ratio:  0.70 Mihai  Croitoru MD Electronically signed by Thurmon Fair MD Signature Date/Time: 03/12/2023/4:47:37 PM    Final     Cardiac Studies   TTE 03/12/2023 IMPRESSIONS   1. Left ventricular ejection fraction, by estimation, is 55 to 60%. The  left ventricle has normal function. The left ventricle demonstrates  regional wall motion abnormalities (see scoring diagram/findings for  description). Left ventricular diastolic  parameters are consistent with Grade I diastolic dysfunction (impaired  relaxation).   2. Right ventricular systolic function is normal. The right ventricular  size is normal. There is moderately elevated pulmonary artery systolic  pressure. The estimated right ventricular systolic pressure is 45.0 mmHg.   3. Left atrial size was moderately dilated.   4. The mitral valve is normal in structure. Mild to moderate mitral valve  regurgitation.   5. The aortic valve is tricuspid. There is mild calcification of the  aortic valve. Aortic valve regurgitation is not visualized. Aortic valve  sclerosis is present, with no evidence of aortic valve stenosis.   6. The inferior vena cava is dilated in size with <50% respiratory  variability, suggesting right atrial pressure of 15 mmHg.  Comparison(s): Prior images reviewed side by side. The left ventricular  wall motion abnormality is new. Possible LBBB related wall motion artifact  is considered, but the apical wall motion is new compared to 2020 and LBBB  was present at that time as  well.   Patient Profile     89 y.o. female with  a hx of HTN, CAD, HLD, COPD/interstitial lung disease, CKD, iron deficiency anemia, history of tobacco use, Hypothyroidism, GERD.  Assessment & Plan    CAD with elevated troponin, no anginal symptoms  Hypertension  Hyperlipidemia Mild mitral regurgitation   Clinically she has improved.  No plans to pursue pursue ischemic evaluation at this time.  Will continue medical management.   [facility]  will sign off.   Medication Recommendations: Aspirin 81 mg daily, atorvastatin 80 mg daily, metoprolol succinate 25 mg daily, losartan 50 mg daily Other recommendations (labs, testing, etc): None Follow up as an outpatient: Routine Please consult [email or web address] for contact info under Cardiology/STEMI.      Signed, Thomasene Ripple, DO  03/14/2023, 9:59 AM

## 2023-03-14 NOTE — TOC Initial Note (Signed)
Transition of Care [facility]) - Initial/Assessment Note    Patient Details  Name: Cindy [name] MRN: [id number] Date of Birth: [birth year]  [facility]) CM/SW Contact:    Gala Lewandowsky, RN Phone Number: 03/14/2023, 10:56 AM  Clinical Narrative: Risk for readmission assessment completed. Patient presented for acute metabolic encephalopathy. PTA patient was from home with spouse and has support of children. Patient has DME  walker, cane, and walk in shower with seat. No DME needs identified at this time. Daughter feels that the patient will benefit from home health PT/OT and wanted to use the same agency as the patients spouse. Case Manager called Becky with CIR and the spouse has been set up with [facility]. [facility] is not in network with [employer]. [facility]en is in network and family is agreeable-referral made to [place] and start of care to begin within 24-48 hours post transition home. Family to transport home in private vehicle. No further home needs identified.               Expected Discharge Plan: Home w Home Health Services Barriers to Discharge: No Barriers Identified   Patient Goals and CMS Choice Patient states their goals for this hospitalization and ongoing recovery are:: to return home with spouse   Choice offered to / list presented to :  (Patient has no preference-only [facility] in Network with insurance.)      Expected Discharge Plan and Services In-house Referral: NA Discharge Planning Services: CM Consult Post Acute Care Choice: Home Health Living arrangements for the past 2 months: Single Family Home Expected Discharge Date: 03/14/23                 DME Agency: NA       HH Arranged: PT, OT HH Agency: [facility] Date HH Agency Contacted: 03/14/23 Time HH Agency Contacted: 1056 Representative spoke with at [facility] Agency: Amy  Prior Living Arrangements/Services Living arrangements for the past 2 months: Single Family  Home Lives with:: Spouse Patient language and need for interpreter reviewed:: Yes Do you feel safe going back to the place where you live?: Yes      Need for Family Participation in Patient Care: Yes (Comment) Care giver support system in place?: Yes (comment) Current home services: DME (has walker, cane, walk in shower with seat) Criminal Activity/Legal Involvement Pertinent to Current Situation/Hospitalization: No - Comment as needed  Activities of Daily Living Home Assistive Devices/Equipment: Walker (specify type) ADL Screening (condition at time of admission) Patient's cognitive ability adequate to safely complete daily activities?: Yes Is the patient deaf or have difficulty hearing?: No Does the patient have difficulty seeing, even when wearing glasses/contacts?: No Does the patient have difficulty concentrating, remembering, or making decisions?: No Patient able to express need for assistance with ADLs?: Yes Does the patient have difficulty dressing or bathing?: No Independently performs ADLs?: No Dressing (OT): Needs assistance Does the patient have difficulty walking or climbing stairs?: Yes Weakness of Legs: Both Weakness of Arms/Hands: Both  Permission Sought/Granted Permission sought to share information with : Family Supports, Magazine features editor, Case Estate manager/land agent granted to share information with : Yes, Verbal Permission Granted     Permission granted to share info w AGENCY: [facility]        Emotional Assessment Appearance:: Appears stated age Attitude/Demeanor/Rapport: Engaged Affect (typically observed): Appropriate Orientation: : Oriented to Self, Oriented to Place Alcohol / Substance Use: Not Applicable Psych Involvement: No (comment)  Admission diagnosis:  Elevated  troponin [R79.89] Urinary tract infection without hematuria, site unspecified [N39.0] Acute metabolic encephalopathy [G93.41] Aspiration pneumonia of both lower lobes,  unspecified aspiration pneumonia type [facility]) [J69.0] Patient Active Problem List   Diagnosis Date Noted   Bronchiectasis with acute exacerbation 03/12/2023   Myocardial injury 03/12/2023   Acute metabolic encephalopathy 03/11/2023   Possible acute cystitis 10/08/2022   Dehydration 10/08/2022   Acute renal failure superimposed on stage 3b chronic kidney disease (HCC) 10/08/2022   Acute prerenal azotemia 10/08/2022   Fever 10/08/2022   Chronic diastolic CHF (congestive heart failure) (HCC) 10/08/2022   Essential hypertension 12/25/2018   Carotid artery disease (HCC) 12/25/2018   Chest pain of uncertain etiology    Unstable angina (HCC) 11/26/2018   Thrombocytopenia (HCC) 01/06/2017   Lower urinary tract infectious disease 01/06/2017   Acquired hypothyroidism 01/06/2017   Anemia 01/06/2017   Umbilical hernia 02/04/2014   Coronary atherosclerosis of native coronary artery 12/11/2013   Old MI (myocardial infarction) 12/11/2013   History of tobacco use 12/11/2013   Angina decubitus (HCC) 12/11/2013   HLD (hyperlipidemia) 12/11/2013   Allergic rhinitis 09/16/2012   Interstitial lung disease (HCC) 05/08/2012   Pulmonary nodule, right 05/06/2012   Cough 03/20/2012   Chronic iron deficiency anemia    Chronic kidney disease, stage 3b (HCC)    PCP:  Daisy Floro, MD Pharmacy:   [facility] [address], [place] - [address] [place] [address] Phone: [phone] Fax: [phone]  [facility] [address] [place] [address] Phone: [phone] Fax: [phone]     Social Determinants of Health (SDOH) Social History: SDOH Screenings   Food Insecurity: No Food Insecurity (03/12/2023)  Housing: Low Risk  (03/12/2023)  Transportation Needs: No Transportation Needs (03/12/2023)  Utilities: Not At Risk (03/12/2023)  Tobacco Use: Medium Risk (03/11/2023)   SDOH Interventions:     Readmission  Risk Interventions    03/14/2023   10:54 AM  Readmission Risk Prevention Plan  Transportation Screening Complete  HRI or Home Care Consult Complete  Social Work Consult for Recovery Care Planning/Counseling Complete  Palliative Care Screening Not Applicable  Medication Review Oceanographer) Referral to Pharmacy

## 2023-03-14 NOTE — Plan of Care (Signed)
Problem: Education: Goal: Knowledge of General Education information will improve Description: Including pain rating scale, medication(s)/side effects and non-pharmacologic comfort measures 03/14/2023 1018 by Herma Carson, RN Outcome: Adequate for Discharge 03/14/2023 1018 by Herma Carson, RN Outcome: Progressing 03/14/2023 0930 by Herma Carson, RN Outcome: Progressing   Problem: Health Behavior/Discharge Planning: Goal: Ability to manage health-related needs will improve 03/14/2023 1018 by Herma Carson, RN Outcome: Adequate for Discharge 03/14/2023 1018 by Herma Carson, RN Outcome: Progressing 03/14/2023 0930 by Herma Carson, RN Outcome: Progressing   Problem: Clinical Measurements: Goal: Ability to maintain clinical measurements within normal limits will improve 03/14/2023 1018 by Herma Carson, RN Outcome: Adequate for Discharge 03/14/2023 1018 by Herma Carson, RN Outcome: Progressing 03/14/2023 0930 by Herma Carson, RN Outcome: Progressing Goal: Will remain free from infection 03/14/2023 1018 by Herma Carson, RN Outcome: Adequate for Discharge 03/14/2023 1018 by Herma Carson, RN Outcome: Progressing 03/14/2023 0930 by Herma Carson, RN Outcome: Progressing Goal: Diagnostic test results will improve 03/14/2023 1018 by Herma Carson, RN Outcome: Adequate for Discharge 03/14/2023 1018 by Herma Carson, RN Outcome: Progressing 03/14/2023 0930 by Herma Carson, RN Outcome: Progressing Goal: Respiratory complications will improve 03/14/2023 1018 by Herma Carson, RN Outcome: Adequate for Discharge 03/14/2023 1018 by Herma Carson, RN Outcome: Progressing 03/14/2023 0930 by Herma Carson, RN Outcome: Progressing Goal: Cardiovascular complication will be avoided 03/14/2023 1018 by Herma Carson, RN Outcome: Adequate for Discharge 03/14/2023 1018 by Herma Carson, RN Outcome: Progressing 03/14/2023 0930 by Herma Carson, RN Outcome:  Progressing   Problem: Activity: Goal: Risk for activity intolerance will decrease 03/14/2023 1018 by Herma Carson, RN Outcome: Adequate for Discharge 03/14/2023 1018 by Herma Carson, RN Outcome: Progressing 03/14/2023 0930 by Herma Carson, RN Outcome: Progressing   Problem: Nutrition: Goal: Adequate nutrition will be maintained 03/14/2023 1018 by Herma Carson, RN Outcome: Adequate for Discharge 03/14/2023 1018 by Herma Carson, RN Outcome: Progressing 03/14/2023 0930 by Herma Carson, RN Outcome: Progressing   Problem: Coping: Goal: Level of anxiety will decrease 03/14/2023 1018 by Herma Carson, RN Outcome: Adequate for Discharge 03/14/2023 1018 by Herma Carson, RN Outcome: Progressing 03/14/2023 0930 by Herma Carson, RN Outcome: Progressing   Problem: Elimination: Goal: Will not experience complications related to bowel motility 03/14/2023 1018 by Herma Carson, RN Outcome: Adequate for Discharge 03/14/2023 1018 by Herma Carson, RN Outcome: Progressing 03/14/2023 0930 by Herma Carson, RN Outcome: Progressing Goal: Will not experience complications related to urinary retention 03/14/2023 1018 by Herma Carson, RN Outcome: Adequate for Discharge 03/14/2023 1018 by Herma Carson, RN Outcome: Progressing 03/14/2023 0930 by Herma Carson, RN Outcome: Progressing   Problem: Pain Managment: Goal: General experience of comfort will improve 03/14/2023 1018 by Herma Carson, RN Outcome: Adequate for Discharge 03/14/2023 1018 by Herma Carson, RN Outcome: Progressing 03/14/2023 0930 by Herma Carson, RN Outcome: Progressing   Problem: Safety: Goal: Ability to remain free from injury will improve 03/14/2023 1018 by Herma Carson, RN Outcome: Adequate for Discharge 03/14/2023 1018 by Herma Carson, RN Outcome: Progressing 03/14/2023 0930 by Herma Carson, RN Outcome: Progressing   Problem: Skin Integrity: Goal: Risk for impaired skin  integrity will decrease 03/14/2023 1018 by Herma Carson, RN Outcome: Adequate for Discharge 03/14/2023 1018 by Herma Carson, RN Outcome: Progressing 03/14/2023 0930 by Herma Carson, RN  Outcome: Progressing

## 2023-03-14 NOTE — Plan of Care (Signed)

## 2023-03-18 DIAGNOSIS — I2510 Atherosclerotic heart disease of native coronary artery without angina pectoris: Secondary | ICD-10-CM | POA: Diagnosis not present

## 2023-03-18 DIAGNOSIS — E7800 Pure hypercholesterolemia, unspecified: Secondary | ICD-10-CM | POA: Diagnosis not present

## 2023-03-18 DIAGNOSIS — J449 Chronic obstructive pulmonary disease, unspecified: Secondary | ICD-10-CM | POA: Diagnosis not present

## 2023-03-18 DIAGNOSIS — I5030 Unspecified diastolic (congestive) heart failure: Secondary | ICD-10-CM | POA: Diagnosis not present

## 2023-03-18 DIAGNOSIS — J849 Interstitial pulmonary disease, unspecified: Secondary | ICD-10-CM | POA: Diagnosis not present

## 2023-03-18 DIAGNOSIS — I130 Hypertensive heart and chronic kidney disease with heart failure and stage 1 through stage 4 chronic kidney disease, or unspecified chronic kidney disease: Secondary | ICD-10-CM | POA: Diagnosis not present

## 2023-03-18 DIAGNOSIS — I272 Pulmonary hypertension, unspecified: Secondary | ICD-10-CM | POA: Diagnosis not present

## 2023-03-18 DIAGNOSIS — J471 Bronchiectasis with (acute) exacerbation: Secondary | ICD-10-CM | POA: Diagnosis not present

## 2023-03-18 DIAGNOSIS — N1832 Chronic kidney disease, stage 3b: Secondary | ICD-10-CM | POA: Diagnosis not present

## 2023-03-18 DIAGNOSIS — G9341 Metabolic encephalopathy: Secondary | ICD-10-CM | POA: Diagnosis not present

## 2023-03-18 DIAGNOSIS — M2550 Pain in unspecified joint: Secondary | ICD-10-CM | POA: Diagnosis not present

## 2023-03-18 DIAGNOSIS — N390 Urinary tract infection, site not specified: Secondary | ICD-10-CM | POA: Diagnosis not present

## 2023-03-21 DIAGNOSIS — R2689 Other abnormalities of gait and mobility: Secondary | ICD-10-CM | POA: Diagnosis not present

## 2023-03-21 DIAGNOSIS — R899 Unspecified abnormal finding in specimens from other organs, systems and tissues: Secondary | ICD-10-CM | POA: Diagnosis not present

## 2023-03-21 DIAGNOSIS — Z6828 Body mass index (BMI) 28.0-28.9, adult: Secondary | ICD-10-CM | POA: Diagnosis not present

## 2023-03-21 DIAGNOSIS — Z09 Encounter for follow-up examination after completed treatment for conditions other than malignant neoplasm: Secondary | ICD-10-CM | POA: Diagnosis not present

## 2023-03-21 DIAGNOSIS — G9341 Metabolic encephalopathy: Secondary | ICD-10-CM | POA: Diagnosis not present

## 2023-03-27 DIAGNOSIS — J471 Bronchiectasis with (acute) exacerbation: Secondary | ICD-10-CM | POA: Diagnosis not present

## 2023-03-27 DIAGNOSIS — N1832 Chronic kidney disease, stage 3b: Secondary | ICD-10-CM | POA: Diagnosis not present

## 2023-03-27 DIAGNOSIS — G9341 Metabolic encephalopathy: Secondary | ICD-10-CM | POA: Diagnosis not present

## 2023-03-27 DIAGNOSIS — N390 Urinary tract infection, site not specified: Secondary | ICD-10-CM | POA: Diagnosis not present

## 2023-03-27 DIAGNOSIS — I272 Pulmonary hypertension, unspecified: Secondary | ICD-10-CM | POA: Diagnosis not present

## 2023-03-27 DIAGNOSIS — J449 Chronic obstructive pulmonary disease, unspecified: Secondary | ICD-10-CM | POA: Diagnosis not present

## 2023-03-27 DIAGNOSIS — I2510 Atherosclerotic heart disease of native coronary artery without angina pectoris: Secondary | ICD-10-CM | POA: Diagnosis not present

## 2023-03-27 DIAGNOSIS — I5030 Unspecified diastolic (congestive) heart failure: Secondary | ICD-10-CM | POA: Diagnosis not present

## 2023-03-27 DIAGNOSIS — I130 Hypertensive heart and chronic kidney disease with heart failure and stage 1 through stage 4 chronic kidney disease, or unspecified chronic kidney disease: Secondary | ICD-10-CM | POA: Diagnosis not present

## 2023-03-27 DIAGNOSIS — E7800 Pure hypercholesterolemia, unspecified: Secondary | ICD-10-CM | POA: Diagnosis not present

## 2023-05-15 DIAGNOSIS — L570 Actinic keratosis: Secondary | ICD-10-CM | POA: Diagnosis not present

## 2023-05-15 DIAGNOSIS — L821 Other seborrheic keratosis: Secondary | ICD-10-CM | POA: Diagnosis not present

## 2023-05-15 DIAGNOSIS — L728 Other follicular cysts of the skin and subcutaneous tissue: Secondary | ICD-10-CM | POA: Diagnosis not present

## 2023-05-22 DIAGNOSIS — G894 Chronic pain syndrome: Secondary | ICD-10-CM | POA: Diagnosis not present

## 2023-05-22 DIAGNOSIS — M549 Dorsalgia, unspecified: Secondary | ICD-10-CM | POA: Diagnosis not present

## 2023-05-22 DIAGNOSIS — Z6826 Body mass index (BMI) 26.0-26.9, adult: Secondary | ICD-10-CM | POA: Diagnosis not present

## 2023-05-22 DIAGNOSIS — J479 Bronchiectasis, uncomplicated: Secondary | ICD-10-CM | POA: Diagnosis not present

## 2023-07-16 ENCOUNTER — Other Ambulatory Visit (HOSPITAL_COMMUNITY): Payer: Self-pay

## 2023-07-19 ENCOUNTER — Other Ambulatory Visit: Payer: Self-pay | Admitting: Family Medicine

## 2023-07-19 DIAGNOSIS — Z1231 Encounter for screening mammogram for malignant neoplasm of breast: Secondary | ICD-10-CM

## 2023-08-02 ENCOUNTER — Ambulatory Visit (HOSPITAL_BASED_OUTPATIENT_CLINIC_OR_DEPARTMENT_OTHER)
Admission: RE | Admit: 2023-08-02 | Discharge: 2023-08-02 | Disposition: A | Discharge status: Home / Self Care | Payer: Medicare (Managed Care) | Source: Ambulatory Visit | Attending: Pulmonary Disease | Admitting: Pulmonary Disease

## 2023-08-02 DIAGNOSIS — J849 Interstitial pulmonary disease, unspecified: Secondary | ICD-10-CM | POA: Insufficient documentation

## 2023-08-02 DIAGNOSIS — I700 Atherosclerosis of aorta: Secondary | ICD-10-CM | POA: Diagnosis not present

## 2023-08-02 DIAGNOSIS — J449 Chronic obstructive pulmonary disease, unspecified: Secondary | ICD-10-CM | POA: Diagnosis not present

## 2023-08-15 ENCOUNTER — Telehealth: Payer: Self-pay | Admitting: Pulmonary Disease

## 2023-08-15 NOTE — Telephone Encounter (Signed)
Patient's husband would like to know the results for his wife's CT scan.

## 2023-08-19 NOTE — Telephone Encounter (Signed)
Pt calling for her CT Scan results

## 2023-08-20 ENCOUNTER — Ambulatory Visit
Admission: RE | Admit: 2023-08-20 | Discharge: 2023-08-20 | Disposition: A | Discharge status: Home / Self Care | Payer: Medicare (Managed Care) | Source: Ambulatory Visit | Attending: Family Medicine | Admitting: Family Medicine

## 2023-08-20 DIAGNOSIS — Z1231 Encounter for screening mammogram for malignant neoplasm of breast: Secondary | ICD-10-CM

## 2023-08-21 NOTE — Telephone Encounter (Signed)
John husband would like to know the results of patient's CT scan. John phone number is [phone]. No available appointments at this time.

## 2023-08-22 ENCOUNTER — Encounter (HOSPITAL_BASED_OUTPATIENT_CLINIC_OR_DEPARTMENT_OTHER): Payer: Self-pay

## 2023-08-22 NOTE — Telephone Encounter (Signed)
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

## 2023-08-22 NOTE — Telephone Encounter (Signed)
Results have been relayed to the patient/authorized caretaker. The patient/authorized caretaker verbalized understanding. No questions at this time.   Please call and schedule an appt for next available.

## 2023-08-23 DIAGNOSIS — M2041 Other hammer toe(s) (acquired), right foot: Secondary | ICD-10-CM | POA: Diagnosis not present

## 2023-08-28 NOTE — Telephone Encounter (Signed)
Unable to leave message. Mailing recall letter

## 2023-09-09 ENCOUNTER — Telehealth: Payer: Self-pay | Admitting: Pulmonary Disease

## 2023-09-09 NOTE — Telephone Encounter (Signed)
Patient would like phone calls of her results. She does not want anything sent to her mychart.

## 2023-09-13 NOTE — Telephone Encounter (Signed)
Results have been relayed to the patient/authorized caretaker. The patient/authorized caretaker verbalized understanding. No questions at this time.    Re-interated results to patient  she was advised to d/c mychart at next visit if she doesn't want results sent in that form.

## 2023-10-19 ENCOUNTER — Emergency Department (HOSPITAL_BASED_OUTPATIENT_CLINIC_OR_DEPARTMENT_OTHER): Payer: Medicare (Managed Care)

## 2023-10-19 ENCOUNTER — Encounter (HOSPITAL_BASED_OUTPATIENT_CLINIC_OR_DEPARTMENT_OTHER): Payer: Self-pay | Admitting: Emergency Medicine

## 2023-10-19 ENCOUNTER — Inpatient Hospital Stay (HOSPITAL_BASED_OUTPATIENT_CLINIC_OR_DEPARTMENT_OTHER)
Admission: EM | Admit: 2023-10-19 | Discharge: 2023-10-21 | DRG: 193 | Disposition: A | Discharge status: Home Health | Payer: Medicare (Managed Care) | Attending: Family Medicine | Admitting: Family Medicine

## 2023-10-19 ENCOUNTER — Other Ambulatory Visit: Payer: Self-pay

## 2023-10-19 DIAGNOSIS — E872 Acidosis, unspecified: Secondary | ICD-10-CM | POA: Diagnosis present

## 2023-10-19 DIAGNOSIS — I130 Hypertensive heart and chronic kidney disease with heart failure and stage 1 through stage 4 chronic kidney disease, or unspecified chronic kidney disease: Secondary | ICD-10-CM | POA: Diagnosis present

## 2023-10-19 DIAGNOSIS — J9601 Acute respiratory failure with hypoxia: Secondary | ICD-10-CM | POA: Diagnosis not present

## 2023-10-19 DIAGNOSIS — D509 Iron deficiency anemia, unspecified: Secondary | ICD-10-CM | POA: Diagnosis present

## 2023-10-19 DIAGNOSIS — N184 Chronic kidney disease, stage 4 (severe): Secondary | ICD-10-CM | POA: Diagnosis present

## 2023-10-19 DIAGNOSIS — I2510 Atherosclerotic heart disease of native coronary artery without angina pectoris: Secondary | ICD-10-CM | POA: Diagnosis present

## 2023-10-19 DIAGNOSIS — R4182 Altered mental status, unspecified: Secondary | ICD-10-CM | POA: Diagnosis present

## 2023-10-19 DIAGNOSIS — Z8249 Family history of ischemic heart disease and other diseases of the circulatory system: Secondary | ICD-10-CM

## 2023-10-19 DIAGNOSIS — K219 Gastro-esophageal reflux disease without esophagitis: Secondary | ICD-10-CM | POA: Diagnosis present

## 2023-10-19 DIAGNOSIS — J1008 Influenza due to other identified influenza virus with other specified pneumonia: Principal | ICD-10-CM | POA: Diagnosis present

## 2023-10-19 DIAGNOSIS — E7800 Pure hypercholesterolemia, unspecified: Secondary | ICD-10-CM | POA: Diagnosis present

## 2023-10-19 DIAGNOSIS — Z79890 Hormone replacement therapy (postmenopausal): Secondary | ICD-10-CM | POA: Diagnosis not present

## 2023-10-19 DIAGNOSIS — E039 Hypothyroidism, unspecified: Secondary | ICD-10-CM | POA: Diagnosis present

## 2023-10-19 DIAGNOSIS — R918 Other nonspecific abnormal finding of lung field: Secondary | ICD-10-CM | POA: Diagnosis not present

## 2023-10-19 DIAGNOSIS — Z951 Presence of aortocoronary bypass graft: Secondary | ICD-10-CM

## 2023-10-19 DIAGNOSIS — I252 Old myocardial infarction: Secondary | ICD-10-CM

## 2023-10-19 DIAGNOSIS — D631 Anemia in chronic kidney disease: Secondary | ICD-10-CM | POA: Diagnosis present

## 2023-10-19 DIAGNOSIS — N1832 Chronic kidney disease, stage 3b: Secondary | ICD-10-CM | POA: Diagnosis present

## 2023-10-19 DIAGNOSIS — D7589 Other specified diseases of blood and blood-forming organs: Secondary | ICD-10-CM | POA: Diagnosis present

## 2023-10-19 DIAGNOSIS — Z7982 Long term (current) use of aspirin: Secondary | ICD-10-CM

## 2023-10-19 DIAGNOSIS — I5032 Chronic diastolic (congestive) heart failure: Secondary | ICD-10-CM | POA: Diagnosis present

## 2023-10-19 DIAGNOSIS — N390 Urinary tract infection, site not specified: Principal | ICD-10-CM

## 2023-10-19 DIAGNOSIS — J440 Chronic obstructive pulmonary disease with acute lower respiratory infection: Secondary | ICD-10-CM | POA: Diagnosis present

## 2023-10-19 DIAGNOSIS — J101 Influenza due to other identified influenza virus with other respiratory manifestations: Secondary | ICD-10-CM | POA: Insufficient documentation

## 2023-10-19 DIAGNOSIS — D696 Thrombocytopenia, unspecified: Secondary | ICD-10-CM | POA: Diagnosis present

## 2023-10-19 DIAGNOSIS — J849 Interstitial pulmonary disease, unspecified: Secondary | ICD-10-CM | POA: Diagnosis present

## 2023-10-19 DIAGNOSIS — Z87891 Personal history of nicotine dependence: Secondary | ICD-10-CM | POA: Diagnosis not present

## 2023-10-19 DIAGNOSIS — R059 Cough, unspecified: Secondary | ICD-10-CM | POA: Diagnosis not present

## 2023-10-19 DIAGNOSIS — E785 Hyperlipidemia, unspecified: Secondary | ICD-10-CM | POA: Diagnosis present

## 2023-10-19 DIAGNOSIS — Z885 Allergy status to narcotic agent status: Secondary | ICD-10-CM

## 2023-10-19 DIAGNOSIS — Z808 Family history of malignant neoplasm of other organs or systems: Secondary | ICD-10-CM

## 2023-10-19 DIAGNOSIS — R410 Disorientation, unspecified: Secondary | ICD-10-CM | POA: Diagnosis not present

## 2023-10-19 DIAGNOSIS — I5033 Acute on chronic diastolic (congestive) heart failure: Secondary | ICD-10-CM | POA: Diagnosis present

## 2023-10-19 DIAGNOSIS — Z955 Presence of coronary angioplasty implant and graft: Secondary | ICD-10-CM

## 2023-10-19 DIAGNOSIS — I6523 Occlusion and stenosis of bilateral carotid arteries: Secondary | ICD-10-CM | POA: Diagnosis not present

## 2023-10-19 DIAGNOSIS — Z79899 Other long term (current) drug therapy: Secondary | ICD-10-CM

## 2023-10-19 DIAGNOSIS — Z90711 Acquired absence of uterus with remaining cervical stump: Secondary | ICD-10-CM

## 2023-10-19 DIAGNOSIS — I10 Essential (primary) hypertension: Secondary | ICD-10-CM | POA: Diagnosis present

## 2023-10-19 LAB — URINALYSIS, W/ REFLEX TO CULTURE (INFECTION SUSPECTED)
Bilirubin Urine: NEGATIVE
Glucose, UA: NEGATIVE mg/dL
Ketones, ur: NEGATIVE mg/dL
Leukocytes,Ua: NEGATIVE
Nitrite: POSITIVE — AB
Protein, ur: 30 mg/dL — AB
Specific Gravity, Urine: 1.016 (ref 1.005–1.030)
pH: 5.5 (ref 5.0–8.0)

## 2023-10-19 LAB — COMPREHENSIVE METABOLIC PANEL
ALT: 14 U/L (ref 0–44)
AST: 29 U/L (ref 15–41)
Albumin: 4.1 g/dL (ref 3.5–5.0)
Alkaline Phosphatase: 54 U/L (ref 38–126)
Anion gap: 11 (ref 5–15)
BUN: 31 mg/dL — ABNORMAL HIGH (ref 8–23)
CO2: 27 mmol/L (ref 22–32)
Calcium: 10.9 mg/dL — ABNORMAL HIGH (ref 8.9–10.3)
Chloride: 101 mmol/L (ref 98–111)
Creatinine, Ser: 1.53 mg/dL — ABNORMAL HIGH (ref 0.44–1.00)
GFR, Estimated: 32 mL/min — ABNORMAL LOW (ref >60)
Glucose, Bld: 95 mg/dL (ref 70–99)
Potassium: 3.7 mmol/L (ref 3.5–5.1)
Sodium: 139 mmol/L (ref 135–145)
Total Bilirubin: 0.7 mg/dL (ref <1.2)
Total Protein: 7.1 g/dL (ref 6.5–8.1)

## 2023-10-19 LAB — PHOSPHORUS: Phosphorus: 2.7 mg/dL (ref 2.5–4.6)

## 2023-10-19 LAB — CBC WITH DIFFERENTIAL/PLATELET
Abs Immature Granulocytes: 0.02 10*3/uL (ref 0.00–0.07)
Basophils Absolute: 0 10*3/uL (ref 0.0–0.1)
Basophils Relative: 0 %
Eosinophils Absolute: 0 10*3/uL (ref 0.0–0.5)
Eosinophils Relative: 1 %
HCT: 33.1 % — ABNORMAL LOW (ref 36.0–46.0)
Hemoglobin: 11.1 g/dL — ABNORMAL LOW (ref 12.0–15.0)
Immature Granulocytes: 0 %
Lymphocytes Relative: 30 %
Lymphs Abs: 1.8 10*3/uL (ref 0.7–4.0)
MCH: 33.3 pg (ref 26.0–34.0)
MCHC: 33.5 g/dL (ref 30.0–36.0)
MCV: 99.4 fL (ref 80.0–100.0)
Monocytes Absolute: 0.8 10*3/uL (ref 0.1–1.0)
Monocytes Relative: 13 %
Neutro Abs: 3.3 10*3/uL (ref 1.7–7.7)
Neutrophils Relative %: 56 %
Platelets: 95 10*3/uL — ABNORMAL LOW (ref 150–400)
RBC: 3.33 MIL/uL — ABNORMAL LOW (ref 3.87–5.11)
RDW: 12.7 % (ref 11.5–15.5)
WBC: 5.9 10*3/uL (ref 4.0–10.5)
nRBC: 0 % (ref 0.0–0.2)

## 2023-10-19 LAB — LACTIC ACID, PLASMA
Lactic Acid, Venous: 2.1 mmol/L (ref 0.5–1.9)
Lactic Acid, Venous: 2.4 mmol/L (ref 0.5–1.9)
Lactic Acid, Venous: 2.7 mmol/L (ref 0.5–1.9)
Lactic Acid, Venous: 1.2 mmol/L (ref 0.5–1.9)

## 2023-10-19 LAB — RESP PANEL BY RT-PCR (RSV, FLU A&B, COVID)  RVPGX2
Influenza A by PCR: POSITIVE — AB
Influenza B by PCR: NEGATIVE
Resp Syncytial Virus by PCR: NEGATIVE
SARS Coronavirus 2 by RT PCR: NEGATIVE

## 2023-10-19 LAB — MAGNESIUM: Magnesium: 1.1 mg/dL — ABNORMAL LOW (ref 1.7–2.4)

## 2023-10-19 MED ORDER — ONDANSETRON HCL 4 MG/2ML IJ SOLN: 4.0000 mg | Freq: Four times a day (QID) | INTRAMUSCULAR | Status: DC | PRN

## 2023-10-19 MED ORDER — CEFTRIAXONE SODIUM 1 G IJ SOLR
1.0000 g | Freq: Once | INTRAMUSCULAR | Status: AC
  Administered 2023-10-19: 1 g via INTRAVENOUS
  Filled 2023-10-19: qty 10

## 2023-10-19 MED ORDER — FERROUS SULFATE 325 (65 FE) MG PO TABS
325.0000 mg | ORAL_TABLET | Freq: Every day | ORAL | Status: DC
  Administered 2023-10-20 – 2023-10-21 (×2): 325 mg via ORAL
  Filled 2023-10-19 (×2): qty 1

## 2023-10-19 MED ORDER — MAGNESIUM SULFATE 2 GM/50ML IV SOLN
2.0000 g | Freq: Once | INTRAVENOUS | Status: AC
  Administered 2023-10-19: 2 g via INTRAVENOUS
  Filled 2023-10-19: qty 50

## 2023-10-19 MED ORDER — SODIUM CHLORIDE 0.9 % IV BOLUS
1000.0000 mL | Freq: Once | INTRAVENOUS | Status: AC
  Administered 2023-10-19: 1000 mL via INTRAVENOUS

## 2023-10-19 MED ORDER — LOSARTAN POTASSIUM 50 MG PO TABS
50.0000 mg | ORAL_TABLET | Freq: Every day | ORAL | Status: DC
  Administered 2023-10-20 – 2023-10-21 (×2): 50 mg via ORAL
  Filled 2023-10-19 (×2): qty 1

## 2023-10-19 MED ORDER — SODIUM CHLORIDE 0.9 % IV SOLN
500.0000 mg | Freq: Once | INTRAVENOUS | Status: AC
  Administered 2023-10-19: 500 mg via INTRAVENOUS
  Filled 2023-10-19: qty 5

## 2023-10-19 MED ORDER — SODIUM CHLORIDE 0.9 % IV SOLN
1.0000 g | INTRAVENOUS | Status: DC
  Administered 2023-10-20: 1 g via INTRAVENOUS
  Filled 2023-10-19: qty 10

## 2023-10-19 MED ORDER — NITROGLYCERIN 0.4 MG SL SUBL: 0.4000 mg | SUBLINGUAL_TABLET | SUBLINGUAL | Status: DC | PRN

## 2023-10-19 MED ORDER — ALBUTEROL SULFATE (2.5 MG/3ML) 0.083% IN NEBU
INHALATION_SOLUTION | RESPIRATORY_TRACT | Status: AC
  Administered 2023-10-19: 2.5 mg via RESPIRATORY_TRACT
  Filled 2023-10-19: qty 3

## 2023-10-19 MED ORDER — IPRATROPIUM BROMIDE 0.06 % NA SOLN
2.0000 | Freq: Three times a day (TID) | NASAL | Status: DC | PRN
  Administered 2023-10-21: 2 via NASAL
  Filled 2023-10-19: qty 15

## 2023-10-19 MED ORDER — SPIRONOLACTONE 25 MG PO TABS
25.0000 mg | ORAL_TABLET | Freq: Every day | ORAL | Status: DC
  Administered 2023-10-20 – 2023-10-21 (×2): 25 mg via ORAL
  Filled 2023-10-19 (×2): qty 1

## 2023-10-19 MED ORDER — SODIUM CHLORIDE 0.9 % IV SOLN
500.0000 mg | INTRAVENOUS | Status: DC
  Filled 2023-10-19: qty 5

## 2023-10-19 MED ORDER — ASPIRIN 81 MG PO TBEC
81.0000 mg | DELAYED_RELEASE_TABLET | Freq: Every day | ORAL | Status: DC
  Administered 2023-10-20 – 2023-10-21 (×2): 81 mg via ORAL
  Filled 2023-10-19: qty 1

## 2023-10-19 MED ORDER — METOPROLOL SUCCINATE ER 25 MG PO TB24
25.0000 mg | ORAL_TABLET | Freq: Every day | ORAL | Status: DC
  Administered 2023-10-20 – 2023-10-21 (×2): 25 mg via ORAL
  Filled 2023-10-19 (×2): qty 1

## 2023-10-19 MED ORDER — LEVOTHYROXINE SODIUM 50 MCG PO TABS
175.0000 ug | ORAL_TABLET | Freq: Every evening | ORAL | Status: DC
  Administered 2023-10-19 – 2023-10-20 (×2): 175 ug via ORAL
  Filled 2023-10-19 (×2): qty 1

## 2023-10-19 MED ORDER — OSELTAMIVIR PHOSPHATE 30 MG PO CAPS
30.0000 mg | ORAL_CAPSULE | Freq: Every day | ORAL | Status: DC
  Administered 2023-10-19 – 2023-10-21 (×3): 30 mg via ORAL
  Filled 2023-10-19 (×3): qty 1

## 2023-10-19 MED ORDER — ALBUTEROL SULFATE (2.5 MG/3ML) 0.083% IN NEBU: 5.0000 mg | INHALATION_SOLUTION | Freq: Once | RESPIRATORY_TRACT | Status: DC

## 2023-10-19 MED ORDER — ACETAMINOPHEN 325 MG PO TABS
650.0000 mg | ORAL_TABLET | Freq: Four times a day (QID) | ORAL | Status: DC | PRN
  Administered 2023-10-19: 650 mg via ORAL
  Filled 2023-10-19: qty 2

## 2023-10-19 MED ORDER — IOHEXOL 350 MG/ML SOLN
75.0000 mL | Freq: Once | INTRAVENOUS | Status: AC | PRN
  Administered 2023-10-19: 75 mL via INTRAVENOUS

## 2023-10-19 MED ORDER — MONTELUKAST SODIUM 10 MG PO TABS
10.0000 mg | ORAL_TABLET | Freq: Every day | ORAL | Status: DC
  Administered 2023-10-19 – 2023-10-20 (×2): 10 mg via ORAL
  Filled 2023-10-19 (×2): qty 1

## 2023-10-19 MED ORDER — ISOSORBIDE MONONITRATE ER 60 MG PO TB24
90.0000 mg | ORAL_TABLET | Freq: Every day | ORAL | Status: DC
  Administered 2023-10-20 – 2023-10-21 (×2): 90 mg via ORAL
  Filled 2023-10-19 (×2): qty 1

## 2023-10-19 MED ORDER — IPRATROPIUM-ALBUTEROL 0.5-2.5 (3) MG/3ML IN SOLN
RESPIRATORY_TRACT | Status: AC
  Filled 2023-10-19: qty 3

## 2023-10-19 MED ORDER — ALBUTEROL SULFATE (2.5 MG/3ML) 0.083% IN NEBU
2.5000 mg | INHALATION_SOLUTION | Freq: Four times a day (QID) | RESPIRATORY_TRACT | Status: DC
  Administered 2023-10-19: 2.5 mg via RESPIRATORY_TRACT
  Filled 2023-10-19: qty 3

## 2023-10-19 MED ORDER — ALBUTEROL SULFATE (2.5 MG/3ML) 0.083% IN NEBU: 2.5000 mg | INHALATION_SOLUTION | Freq: Once | RESPIRATORY_TRACT | Status: AC

## 2023-10-19 MED ORDER — IPRATROPIUM-ALBUTEROL 0.5-2.5 (3) MG/3ML IN SOLN
3.0000 mL | Freq: Once | RESPIRATORY_TRACT | Status: AC
  Administered 2023-10-19: 3 mL via RESPIRATORY_TRACT

## 2023-10-19 MED ORDER — PANTOPRAZOLE SODIUM 40 MG PO TBEC
40.0000 mg | DELAYED_RELEASE_TABLET | Freq: Two times a day (BID) | ORAL | Status: DC
  Administered 2023-10-19 – 2023-10-21 (×4): 40 mg via ORAL
  Filled 2023-10-19 (×4): qty 1

## 2023-10-19 MED ORDER — ONDANSETRON HCL 4 MG PO TABS: 4.0000 mg | ORAL_TABLET | Freq: Four times a day (QID) | ORAL | Status: DC | PRN

## 2023-10-19 MED ORDER — ACETAMINOPHEN 650 MG RE SUPP: 650.0000 mg | Freq: Four times a day (QID) | RECTAL | Status: DC | PRN

## 2023-10-19 MED ORDER — IPRATROPIUM BROMIDE 0.02 % IN SOLN: 0.5000 mg | Freq: Once | RESPIRATORY_TRACT | Status: DC

## 2023-10-19 MED ORDER — ALBUTEROL SULFATE (2.5 MG/3ML) 0.083% IN NEBU
2.5000 mg | INHALATION_SOLUTION | Freq: Three times a day (TID) | RESPIRATORY_TRACT | Status: DC
  Administered 2023-10-19 – 2023-10-20 (×3): 2.5 mg via RESPIRATORY_TRACT
  Filled 2023-10-19 (×4): qty 3

## 2023-10-19 MED ORDER — METHYLPREDNISOLONE SODIUM SUCC 40 MG IJ SOLR
40.0000 mg | Freq: Once | INTRAMUSCULAR | Status: AC
  Administered 2023-10-19: 40 mg via INTRAVENOUS
  Filled 2023-10-19: qty 1

## 2023-10-19 MED ORDER — ENOXAPARIN SODIUM 30 MG/0.3ML IJ SOSY: 30.0000 mg | PREFILLED_SYRINGE | Freq: Every day | INTRAMUSCULAR | Status: DC

## 2023-10-19 NOTE — ED Notes (Signed)
CRITICAL VALUE STICKER  CRITICAL VALUE:Lactate 2.1  RECEIVER (on-site recipient of call):Carmon Ginsberg, RN  DATE & TIME NOTIFIED:   MESSENGER (representative from lab):  MD NOTIFIED: Young  TIME OF NOTIFICATION:1220  RESPONSE:

## 2023-10-19 NOTE — ED Notes (Signed)
Provider informed of the inability to obtain the second culture... Provider okayed to hold off on the second culture.Marland KitchenMarland Kitchen

## 2023-10-19 NOTE — ED Notes (Signed)
Attempted to call report at this time, nurse unavailable, nurse will call back when available.

## 2023-10-19 NOTE — ED Notes (Signed)
Report given to the Floor RN. 

## 2023-10-19 NOTE — ED Triage Notes (Signed)
Started yesterday confused/lethargy. Pt has been coughing/congestion/cold like symptoms for 2 days prior. No fevers.concerned for uti

## 2023-10-19 NOTE — ED Notes (Signed)
Pt aware of the need for a urine... Unable to currently provide the sample... 

## 2023-10-19 NOTE — ED Provider Notes (Signed)
[facility] AT [facility] Provider Note   CSN: [id number] Arrival date & time: 10/19/23  3086     History  Chief Complaint  Patient presents with  . Altered Mental Status    Cindy [name] is a 89 y.o. female.  Is an 89 year old female presenting emergency department with her son complaining of altered mental status.  Son notes that mother has history of similar presentation in the past secondary to UTI and pneumonia.  For the past 24 to 36 hours he has noted a decline in her alertness and mentation.  Seemingly more lethargic than usual.  He reports that she is typically alert and oriented x 4 and independent with ADLs.  On my exam patient can tell me her name, and follow commands, but will not provide any useful history.  Son denies any viral URI symptoms or preceding complaints by patient.   Altered Mental Status      Home Medications Prior to Admission medications   Medication Sig Start Date End Date Taking? Authorizing Provider  albuterol (VENTOLIN HFA) 108 (90 Base) MCG/ACT inhaler Inhale 1 puff into the lungs every 4 (four) hours as needed for wheezing or shortness of breath. 03/14/23   Dorcas Carrow, MD  aspirin 81 MG tablet Take 81 mg by mouth daily.     [provider]  atorvastatin (LIPITOR) 80 MG tablet Take 1 tablet (80 mg total) by mouth daily. 12/25/18   Berton Bon, NP  benzonatate (TESSALON) 200 MG capsule Take 1 capsule by mouth twice daily as needed for cough Patient taking differently: Take 200 mg by mouth 2 (two) times daily. 09/23/20   Oretha Milch, MD  Biotin 5 MG CAPS Take 1 capsule by mouth daily at 6 (six) AM.    [provider]  Calcium Carbonate-Vitamin D 600-200 MG-UNIT TABS Take 1 tablet by mouth 2 (two) times daily.    [provider]  dextromethorphan (DELSYM) 30 MG/5ML liquid Take 10 mLs (60 mg total) by mouth 2 (two) times daily as needed for cough. 03/14/23   Dorcas Carrow, MD  ferrous  sulfate 325 (65 FE) MG tablet Take 325 mg by mouth daily with breakfast.    [provider]  fexofenadine (ALLEGRA) 180 MG tablet Take 1 tablet (180 mg total) by mouth daily. 01/28/23   Oretha Milch, MD  ipratropium (ATROVENT) 0.06 % nasal spray Place 2 sprays into both nostrils 3 (three) times daily.    [provider]  isosorbide mononitrate (IMDUR) 30 MG 24 hr tablet Take 90 mg by mouth daily. 11/13/21   [provider]  levothyroxine (SYNTHROID, LEVOTHROID) 175 MCG tablet Take 175 mcg by mouth every evening.    [provider]  losartan (COZAAR) 50 MG tablet Take 50 mg by mouth daily.    [provider]  metoprolol succinate (TOPROL-XL) 25 MG 24 hr tablet Take 1 tablet (25 mg total) by mouth daily. 02/14/23   Jake Bathe, MD  montelukast (SINGULAIR) 10 MG tablet Take 10 mg by mouth at bedtime. 08/31/20   [provider]  Multiple Vitamins-Minerals (ICAPS AREDS 2 PO) Take 1 capsule by mouth in the morning and at bedtime.    [provider]  Niacinamide-Zn-Cu-Methfo-Se-Cr (NICOTINAMIDE PO) Take 1 capsule by mouth 2 (two) times daily.    [provider]  nitroGLYCERIN (NITROSTAT) 0.4 MG SL tablet PLACE 1 TABLET UNDER THE TONGUE EVERY 5 MINUTES AS NEEDED FOR CHEST PAIN Patient taking differently: Place 0.4  mg under the tongue every 5 (five) minutes as needed for chest pain. 09/11/19   Jake Bathe, MD  pantoprazole (PROTONIX) 40 MG tablet Take 40 mg by mouth 2 (two) times daily.    [provider]  spironolactone-hydrochlorothiazide (ALDACTAZIDE) 25-25 MG tablet Take 1 tablet by mouth daily.    [provider]  TART CHERRY PO Take 1,000 mg by mouth 2 (two) times a week.    [provider]  traMADol (ULTRAM) 50 MG tablet Take 50 mg by mouth 2 (two) times daily.    [provider]      Allergies    Codeine    Review of Systems   Review of Systems  Physical Exam Updated Vital Signs BP  (!) 143/69   Pulse 94   Temp (!) 102.4 F (39.1 C) (Rectal)   Resp (!) 21   LMP  (LMP Unknown)   SpO2 98%  Physical Exam Vitals and nursing note reviewed.  Constitutional:      General: She is not in acute distress.    Appearance: She is not toxic-appearing.  HENT:     Head: Normocephalic and atraumatic.     Nose: Nose normal.     Mouth/Throat:     Mouth: Mucous membranes are moist.  Eyes:     Conjunctiva/sclera: Conjunctivae normal.  Cardiovascular:     Rate and Rhythm: Normal rate and regular rhythm.  Pulmonary:     Effort: Pulmonary effort is normal.     Breath sounds: Normal breath sounds.  Abdominal:     General: Abdomen is flat. There is no distension.     Tenderness: There is no abdominal tenderness. There is no guarding or rebound.  Genitourinary:    General: Normal vulva.     Comments: No sacral decubitus Skin:    General: Skin is warm and dry.     Capillary Refill: Capillary refill takes less than 2 seconds.  Neurological:     Mental Status: She is alert and oriented to person, place, and time.  Psychiatric:        Mood and Affect: Mood normal.        Behavior: Behavior normal.     ED Results / Procedures / Treatments   Labs (all labs ordered are listed, but only abnormal results are displayed) Labs Reviewed  LACTIC ACID, PLASMA - Abnormal; Notable for the following components:      Result Value   Lactic Acid, Venous 2.1 (*)    All other components within normal limits  COMPREHENSIVE METABOLIC PANEL - Abnormal; Notable for the following components:   BUN 31 (*)    Creatinine, Ser 1.53 (*)    Calcium 10.9 (*)    GFR, Estimated 32 (*)    All other components within normal limits  CBC WITH DIFFERENTIAL/PLATELET - Abnormal; Notable for the following components:   RBC 3.33 (*)    Hemoglobin 11.1 (*)    HCT 33.1 (*)    Platelets 95 (*)    All other components within normal limits  URINALYSIS, W/ REFLEX TO CULTURE (INFECTION SUSPECTED) - Abnormal;  Notable for the following components:   Hgb urine dipstick TRACE (*)    Protein, ur 30 (*)    Nitrite POSITIVE (*)    Bacteria, UA MANY (*)    All other components within normal limits  CULTURE, BLOOD (ROUTINE X 2)  CULTURE, BLOOD (ROUTINE X 2)  RESP PANEL BY RT-PCR (RSV, FLU A&B, COVID)  [id number]  LACTIC ACID,  PLASMA    EKG EKG Interpretation Date/Time:  Saturday October 19 2023 10:32:52 EST Ventricular Rate:  85 PR Interval:    QRS Duration:  151 QT Interval:  373 QTC Calculation: 444 R Axis:   129  Text Interpretation: Atrial fibrillation Left bundle branch block Confirmed by Estanislado Pandy [phone]) on 10/19/2023 11:08:02 AM  Radiology CT Head Wo Contrast Result Date: 10/19/2023 CLINICAL DATA:  Confusion and lethargy beginning yesterday. Mental status change. EXAM: CT HEAD WITHOUT CONTRAST TECHNIQUE: Contiguous axial images were obtained from the base of the skull through the vertex without intravenous contrast. RADIATION DOSE REDUCTION: This exam was performed according to the departmental dose-optimization program which includes automated exposure control, adjustment of the mA and/or kV according to patient size and/or use of iterative reconstruction technique. COMPARISON:  CT head without contrast 03/11/2023 FINDINGS: Brain: No acute infarct, hemorrhage, or mass lesion is present. Stable mild atrophy and white matter changes are likely within normal limits for age. Remote white matter infarct is again noted in the left parietal lobe. Deep brain nuclei are within normal limits. The ventricles are of normal size. No significant extraaxial fluid collection is present. The brainstem and cerebellum are within normal limits. Midline structures are within normal limits. Vascular: Atherosclerotic calcifications are again noted within the cavernous internal carotid arteries bilaterally. No hyperdense vessel is present. Skull: Calvarium is intact. No focal lytic or blastic lesions are present. No  significant extracranial soft tissue lesion is present. Sinuses/Orbits: The paranasal sinuses and mastoid air cells are clear. The globes and orbits are within normal limits. IMPRESSION: 1. No acute intracranial abnormality or significant interval change. 2. Stable mild atrophy and white matter changes are likely within normal limits for age. 3. 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 Electronically Signed   By: Marin Roberts M.D.   On: 10/19/2023 11:10   DG Chest Port 1 View Result Date: 10/19/2023 CLINICAL DATA:  Cough for 2 days. EXAM: PORTABLE CHEST 1 VIEW COMPARISON:  Mar 11, 2023. FINDINGS: The heart size and mediastinal contours are within normal limits. Sternotomy wires are noted. No acute pulmonary disease. The visualized skeletal structures are unremarkable. IMPRESSION: No active disease. Electronically Signed   By: Lupita Raider M.D.   On: 10/19/2023 11:04    Procedures Procedures    Medications Ordered in ED Medications  ipratropium-albuterol (DUONEB) 0.5-2.5 (3) MG/3ML nebulizer solution (has no administration in time range)  sodium chloride 0.9 % bolus 1,000 mL (0 mLs Intravenous Stopped 10/19/23 1237)  cefTRIAXone (ROCEPHIN) 1 g in sodium chloride 0.9 % 100 mL IVPB (0 g Intravenous Stopped 10/19/23 1226)  azithromycin (ZITHROMAX) 500 mg in sodium chloride 0.9 % 250 mL IVPB (0 mg Intravenous Stopped 10/19/23 1313)  albuterol (PROVENTIL) (2.5 MG/3ML) 0.083% nebulizer solution 2.5 mg (2.5 mg Nebulization Given 10/19/23 1101)  ipratropium-albuterol (DUONEB) 0.5-2.5 (3) MG/3ML nebulizer solution 3 mL (3 mLs Nebulization Given 10/19/23 1100)  iohexol (OMNIPAQUE) 350 MG/ML injection 75 mL (75 mLs Intravenous Contrast Given 10/19/23 1305)  sodium chloride 0.9 % bolus 1,000 mL (1,000 mLs Intravenous New Bag/Given 10/19/23 1346)    ED Course/ Medical Decision Making/ A&P Clinical Course as of 10/19/23 1442  Sat Oct 19, 2023  1106 Urinalysis, w/ Reflex to Culture  (Infection Suspected) -Urine, Clean Catch(!) C/w UTI. Receiving antibiotics for CAP. Should cover UTI as well.  [TY]  1130 CT Head Wo Contrast MPRESSION: 1. No acute intracranial abnormality or significant interval change. 2. Stable mild atrophy and white matter  changes are likely within normal limits for age. 3. 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   Electronically Signed   [TY]  1130 DG Chest Port 1 View IMPRESSION: No active disease.   [TY]  1131 Patient requiring oxygen, does have cough.  Chest x-ray did not appear to have overt pneumonia on my independent interpretation, but does have poor inspiratory get CT scan of chest to evaluate for alternative causes of her hypoxia. [TY]  1224 WBC: 5.9 [TY]  1224 Lactic Acid, Venous(!!): 2.1 [TY]  1224 Comprehensive metabolic panel(!) No significant metabolic derangements.  No transaminitis to suggest hepatobiliary disease/hepatic encephalopathy. [TY]  1442 Influenza A By PCR(!): POSITIVE [TY]    Clinical Course User Index [TY] Coral Spikes, DO                                 Medical Decision Making This is an 89 year old female presenting emergency department for altered mental status.  She has complex past medical history to include COPD, CAD, hypertension, thyroid disease.  Per chart review was admitted 520/24 for complicated UTI.  Patient was febrile here, tachypneic, mildly elevated heart rate.  Does have COPD history, but not on oxygen.  Was placed on 2 L nasal cannula given breathing treatment.  Chest x-ray without overt pneumonia.  Follow-up CTA pending, no overt saddle pulmonary embolism on my independent interpretation does appear to have some minor infiltrates in the bases.  She does have cough.  Treated with Rocephin and azithromycin empirically given her fever tachycardia and tachypnea for possible pneumonia given her cough.  She has no leukocytosis, but a mildly elevated lactate of 2.1.  No significant  metabolic derangements.  Kidney function appears to be at baseline.  No transaminitis to suggest hepatobiliary disease/hepatic encephalopathy.  Her UA does have evidence of urinary tract infection.  She did receive IV fluids.  Given patient's altered mental status, CT head obtained.  Negative for acute pathology.  Given patient's persistent altered mental status in the setting of urinary tract infection.  Will admit for complicated UTI/sepsis  Amount and/or Complexity of Data Reviewed Independent Historian:     Details: Son notes similar presentations in the past.  He also states that mother is full code External Data Reviewed:     Details: See above.  Labs: ordered. Decision-making details documented in ED Course. Radiology: ordered. Decision-making details documented in ED Course. ECG/medicine tests: independent interpretation performed.    Details: No STEMI on my independent interpretation.   Risk Prescription drug management. Decision regarding hospitalization.          Final Clinical Impression(s) / ED Diagnoses Final diagnoses:  None    Rx / DC Orders ED Discharge Orders     None         Coral Spikes, DO 10/19/23 1409

## 2023-10-19 NOTE — H&P (Signed)
History and Physical    Patient: Cindy [name] VFI:[id number] DOB: Jan 08, 1932 DOA: 10/19/2023 DOS: the patient was seen and examined on 10/19/2023 PCP: Daisy Floro, MD  Patient coming from: Home  Chief Complaint:  Chief Complaint  Patient presents with   Altered Mental Status   HPI: Cindy [name] is a 89 y.o. female with medical history significant of 97.5 F, pulse 78, respiration 18, BP 159/58 mmHg and O2 sat 93% on room air.  The patient received multiple albuterol nebs, azithromycin 500 mg IVP, ceftriaxone 1 g IVPB and 2000 mL of normal saline bolus.  History is taken from the patient's son who stated that his father, the patient's husband became sick as well with flulike symptoms.  She is able to answer simple questions.  No headache, chest pain, abdominal pain, but has some myalgias.  She declined to eat anything at the moment.  Lab work: Her urine analysis showed trace hemoglobin, positive nitrites, protein of 30 mg/dL and many bacteria.  CBC is her white count 5.9 with 56% neutrophils, 30% lymphocytes and 13% monocytes.  Hemoglobin 11.1 g/dL and platelets 95.  CMP showed a BUN of 31, creatinine 1.53 and calcium 10.9 mg/dL.  The rest of the CMP measurements were normal.  Lactic acid is 2.1 then 2.4 mmol/L, but the patient was having beta agonist nebulizations.  Coronavirus PCR was negative, , influenza B and RSV PCR were negative.  Influenza A PCR was positive.  Imaging: CT head without contrast with no acute intracranial abnormality or significant interval change.  Portable 1 view chest radiograph with normal heart size and mediastinal contours.  No active disease.  CTA chest with no embolism to the proximal subsegmental pulmonary artery level.  There is new moderate thickening of the walls of the bronchial tree, nonspecific but most commonly seen with bronchitis or reactive airways disease such as asthma.  No lung mass, consolidation, pleural effusion or pneumothorax.  New  patchy area of Actal ectasis predominantly in the bilateral lower lobes.  Aortic atherosclerosis.  Redemonstration of the lower thoracic and upper lumbar spine findings.   ED course: Initial vital signs were temperature 97.5 F but subsequently increased to 102.4 F, pulse 78, respiration 18, BP 159/58 mmHg O2 sat 93% on room air.  She is now satting in the upper 90s to 100% on 2 L nasal cannula.  She is also received azithromycin 500 mg IVPB, ceftriaxone 1 g IVPB, bronchodilators and 2000 mL of normal saline bolus.  Review of Systems: As mentioned in the history of present illness. All other systems reviewed and are negative. Past Medical History:  Diagnosis Date   Asthma    Bronchitis    COPD (chronic obstructive pulmonary disease) (HCC)    Coronary artery disease    MI age 9   GERD (gastroesophageal reflux disease)    Heart attack (HCC)    Hypercholesteremia    Hypertension    ILD (interstitial lung disease) (HCC)    Iron deficiency anemia    used to see hem in [place], [place].    Renal insufficiency    Thyroid disease    Past Surgical History:  Procedure Laterality Date   APPENDECTOMY     BREAST BIOPSY Right    benign   CORONARY ANGIOPLASTY WITH STENT PLACEMENT     CORONARY ARTERY BYPASS GRAFT  09/1979   LEFT HEART CATH AND CORS/GRAFTS ANGIOGRAPHY N/A 11/27/2018   Procedure: LEFT HEART CATH AND CORS/GRAFTS ANGIOGRAPHY;  Surgeon: Runell Gess, MD;  Location: [facility];  Service: Cardiovascular;  Laterality: N/A;   Moh's Surgeries     for squamous cell carcinoma   PARTIAL HYSTERECTOMY  10/1978   Social History:  reports that she quit smoking about 44 years ago. Her smoking use included cigarettes. She started smoking about 66 years ago. She has a 33 pack-year smoking history. She has never used smokeless tobacco. She reports current alcohol use. She reports that she does not use drugs.  Allergies  Allergen Reactions   Codeine Other (See Comments)    Headache from heavy  doses of codeine only    Family History  Problem Relation Age of Onset   Heart disease Mother    Heart disease Father    Melanoma Sister        x2   Breast cancer Neg Hx     Prior to Admission medications   Medication Sig Start Date End Date Taking? Authorizing Provider  albuterol (VENTOLIN HFA) 108 (90 Base) MCG/ACT inhaler Inhale 1 puff into the lungs every 4 (four) hours as needed for wheezing or shortness of breath. 03/14/23   Dorcas Carrow, MD  aspirin 81 MG tablet Take 81 mg by mouth daily.     [provider]  atorvastatin (LIPITOR) 80 MG tablet Take 1 tablet (80 mg total) by mouth daily. 12/25/18   Berton Bon, NP  benzonatate (TESSALON) 200 MG capsule Take 1 capsule by mouth twice daily as needed for cough Patient taking differently: Take 200 mg by mouth 2 (two) times daily. 09/23/20   Oretha Milch, MD  Biotin 5 MG CAPS Take 1 capsule by mouth daily at 6 (six) AM.    [provider]  Calcium Carbonate-Vitamin D 600-200 MG-UNIT TABS Take 1 tablet by mouth 2 (two) times daily.    [provider]  dextromethorphan (DELSYM) 30 MG/5ML liquid Take 10 mLs (60 mg total) by mouth 2 (two) times daily as needed for cough. 03/14/23   Dorcas Carrow, MD  ferrous sulfate 325 (65 FE) MG tablet Take 325 mg by mouth daily with breakfast.    [provider]  fexofenadine (ALLEGRA) 180 MG tablet Take 1 tablet (180 mg total) by mouth daily. 01/28/23   Oretha Milch, MD  ipratropium (ATROVENT) 0.06 % nasal spray Place 2 sprays into both nostrils 3 (three) times daily.    [provider]  isosorbide mononitrate (IMDUR) 30 MG 24 hr tablet Take 90 mg by mouth daily. 11/13/21   [provider]  levothyroxine (SYNTHROID, LEVOTHROID) 175 MCG tablet Take 175 mcg by mouth every evening.    [provider]  losartan (COZAAR) 50 MG tablet Take 50 mg by mouth daily.    [provider]  metoprolol succinate (TOPROL-XL) 25 MG 24 hr tablet  Take 1 tablet (25 mg total) by mouth daily. 02/14/23   Jake Bathe, MD  montelukast (SINGULAIR) 10 MG tablet Take 10 mg by mouth at bedtime. 08/31/20   [provider]  Multiple Vitamins-Minerals (ICAPS AREDS 2 PO) Take 1 capsule by mouth in the morning and at bedtime.    [provider]  Niacinamide-Zn-Cu-Methfo-Se-Cr (NICOTINAMIDE PO) Take 1 capsule by mouth 2 (two) times daily.    [provider]  nitroGLYCERIN (NITROSTAT) 0.4 MG SL tablet PLACE 1 TABLET UNDER THE TONGUE EVERY 5 MINUTES AS NEEDED FOR CHEST PAIN Patient taking differently: Place 0.4 mg under the tongue every 5 (five) minutes as needed for chest pain. 09/11/19   Donato Ascher  C, MD  pantoprazole (PROTONIX) 40 MG tablet Take 40 mg by mouth 2 (two) times daily.    [provider]  spironolactone-hydrochlorothiazide (ALDACTAZIDE) 25-25 MG tablet Take 1 tablet by mouth daily.    [provider]  TART CHERRY PO Take 1,000 mg by mouth 2 (two) times a week.    [provider]  traMADol (ULTRAM) 50 MG tablet Take 50 mg by mouth 2 (two) times daily.    [provider]    Physical Exam: Vitals:   10/19/23 1400 10/19/23 1545 10/19/23 1554 10/19/23 1600  BP: (!) 143/69  (!) 149/56   Pulse: 94 96 90   Resp: (!) 21 (!) 28 (!) 22   Temp: 99.7 F (37.6 C)     TempSrc: Oral     SpO2: 98% 100% 99%   Weight:    64.5 kg  Height:    5' (1.524 m)   Physical Exam Vitals and nursing note reviewed.  Constitutional:      General: She is awake. She is not in acute distress.    Appearance: Normal appearance. She is ill-appearing.  HENT:     Head: Normocephalic.     Nose: Rhinorrhea present.     Mouth/Throat:     Mouth: Mucous membranes are dry.  Eyes:     General: No scleral icterus.    Pupils: Pupils are equal, round, and reactive to light.  Neck:     Vascular: No JVD.  Cardiovascular:     Rate and Rhythm: Normal rate and regular rhythm.     Heart sounds: S1 normal and  S2 normal.  Pulmonary:     Effort: Pulmonary effort is normal.     Breath sounds: Wheezing and rhonchi present. No rales.  Abdominal:     General: Bowel sounds are normal. There is no distension.     Palpations: Abdomen is soft.     Tenderness: There is no abdominal tenderness. There is no right CVA tenderness or left CVA tenderness.  Musculoskeletal:     Cervical back: Neck supple.     Right lower leg: No edema.     Left lower leg: No edema.  Skin:    General: Skin is warm and dry.  Neurological:     General: No focal deficit present.     Mental Status: She is alert. She is disoriented.  Psychiatric:        Mood and Affect: Mood normal.        Behavior: Behavior normal. Behavior is cooperative.     Data Reviewed:  Results are pending, will review when available. 03/12/2023 echocardiogram report IMPRESSIONS:   1. Left ventricular ejection fraction, by estimation, is 55 to 60%. The  left ventricle has normal function. The left ventricle demonstrates  regional wall motion abnormalities (see scoring diagram/findings for  description). Left ventricular diastolic  parameters are consistent with Grade I diastolic dysfunction (impaired  relaxation).   2. Right ventricular systolic function is normal. The right ventricular  size is normal. There is moderately elevated pulmonary artery systolic  pressure. The estimated right ventricular systolic pressure is 45.0 mmHg.   3. Left atrial size was moderately dilated.   4. The mitral valve is normal in structure. Mild to moderate mitral valve  regurgitation.   5. The aortic valve is tricuspid. There is mild calcification of the  aortic valve. Aortic valve regurgitation is not visualized. Aortic valve  sclerosis is present, with no evidence of aortic valve stenosis.   6.  The inferior vena cava is dilated in size with <50% respiratory  variability, suggesting right atrial pressure of 15 mmHg.   EKG: Vent. rate 85 BPM PR interval *  ms QRS duration 151 ms QT/QTcB 373/444 ms P-R-T axes * 129 -25 Atrial fibrillation Left bundle branch block  Assessment and Plan: Principal Problem:   Acute respiratory failure with hypoxia (HCC) In the setting of:   Influenza A Admit to telemetry/inpatient. Continue supplemental oxygen. Scheduled bronchodilators. Continue ceftriaxone 1 g IVPB daily. Continue azithromycin 500 mg IVPB daily. Methylprednisolone 40 mg IVP x 1. Begin oseltamivir 30 mg p.o. bid. Check strep pneumoniae urinary antigen. Check sputum Gram stain, culture and sensitivity. Follow-up blood culture and sensitivity. Follow-up CBC and chemistry in the morning.  Active Problems:   Hypomagnesemia Replacing. Follow level as needed.    Hypercalcemia Hold hydrochlorothiazide. Check calcium level in AM. Further workup depending on results.    Lactic acidosis Not hypotensive or hypoxic. Like exacerbated by beta agonist nebs. Follow lactic acid level later this evening.    Chronic iron deficiency anemia Monitor hematocrit and hemoglobin. Transfuse as needed.    Thrombocytopenia (HCC) Monitor platelet count.    Chronic kidney disease, stage 3b (HCC) Monitor hematocrit hemoglobin.    HLD (hyperlipidemia) Will hold atorvastatin given body aches.    Acquired hypothyroidism Continue levothyroxine 175 mcg p.o. daily.    Essential hypertension   Chronic diastolic CHF (congestive heart failure) (HCC) Holding HCTZ. Continue spironolactone Continue Imdur 90 mg p.o. daily in the morning. Continue losartan 50 mg p.o. daily in the morning. Continue metoprolol succinate 25 mg p.o. daily.    Advance Care Planning:   Code Status: Full Code   Consults:   Family Communication: Her son Casimiro Needle was at bedside.  Severity of Illness: The appropriate patient status for this patient is INPATIENT. Inpatient status is judged to be reasonable and necessary in order to provide the required intensity of service to  ensure the patient's safety. The patient's presenting symptoms, physical exam findings, and initial radiographic and laboratory data in the context of their chronic comorbidities is felt to place them at high risk for further clinical deterioration. Furthermore, it is not anticipated that the patient will be medically stable for discharge from the hospital within 2 midnights of admission.   * I certify that at the point of admission it is my clinical judgment that the patient will require inpatient hospital care spanning beyond 2 midnights from the point of admission due to high intensity of service, high risk for further deterioration and high frequency of surveillance required.*  Author: Bobette Mo, MD 10/19/2023 4:33 PM  For on call review [email or web address].   This document was prepared using Dragon voice recognition software and may contain some unintended transcription errors.

## 2023-10-19 NOTE — ED Notes (Signed)
Report given to the floor RN.

## 2023-10-19 NOTE — Plan of Care (Signed)
89 year old F with PMH of COPD, diastolic CHF, CAD, CKD-3B, HTN and thrombocytopenia presented to ED with altered mental status/confusion and lethargy.  Per triage nurse note, patient has URI symptoms as well.   In ED, febrile to 102.4.  Slightly hypertensive.  WBC 5.9.  Hgb 11.1.  Platelet 95. Cr 1.53.  BUN 31.  Calcium 10.9.  UA consistent with UTI.  Lactic acid 2.1>> 2.4.  Influenza A PCR positive.  CXR and CT head without acute finding.  Blood culture ordered.  Received 1 L bolus, CTX, Zithromax and nebulizers..  Second bolus going.  Admission requested for encephalopathy, UTI, lethargy and influenza A infection.  CT angio chest done and pending.  Accepted to telemetry bed.  Recommended starting Tamiflu

## 2023-10-19 NOTE — ED Notes (Signed)
Radiology de accessed right [facility] IV after CT... They stated that it "blew"...

## 2023-10-19 NOTE — ED Notes (Signed)
 Called [facility] for transport, pt bed assignment is ready

## 2023-10-20 DIAGNOSIS — J9601 Acute respiratory failure with hypoxia: Secondary | ICD-10-CM | POA: Diagnosis not present

## 2023-10-20 LAB — COMPREHENSIVE METABOLIC PANEL
ALT: 23 U/L (ref 0–44)
AST: 61 U/L — ABNORMAL HIGH (ref 15–41)
Albumin: 3.8 g/dL (ref 3.5–5.0)
Alkaline Phosphatase: 47 U/L (ref 38–126)
Anion gap: 9 (ref 5–15)
BUN: 25 mg/dL — ABNORMAL HIGH (ref 8–23)
CO2: 23 mmol/L (ref 22–32)
Calcium: 9.4 mg/dL (ref 8.9–10.3)
Chloride: 104 mmol/L (ref 98–111)
Creatinine, Ser: 1.25 mg/dL — ABNORMAL HIGH (ref 0.44–1.00)
GFR, Estimated: 41 mL/min — ABNORMAL LOW (ref >60)
Glucose, Bld: 146 mg/dL — ABNORMAL HIGH (ref 70–99)
Potassium: 3.9 mmol/L (ref 3.5–5.1)
Sodium: 136 mmol/L (ref 135–145)
Total Bilirubin: 0.5 mg/dL (ref <1.2)
Total Protein: 6.4 g/dL — ABNORMAL LOW (ref 6.5–8.1)

## 2023-10-20 LAB — CBC
HCT: 32.7 % — ABNORMAL LOW (ref 36.0–46.0)
Hemoglobin: 11 g/dL — ABNORMAL LOW (ref 12.0–15.0)
MCH: 34.5 pg — ABNORMAL HIGH (ref 26.0–34.0)
MCHC: 33.6 g/dL (ref 30.0–36.0)
MCV: 102.5 fL — ABNORMAL HIGH (ref 80.0–100.0)
Platelets: 88 10*3/uL — ABNORMAL LOW (ref 150–400)
RBC: 3.19 MIL/uL — ABNORMAL LOW (ref 3.87–5.11)
RDW: 13 % (ref 11.5–15.5)
WBC: 4.2 10*3/uL (ref 4.0–10.5)
nRBC: 0 % (ref 0.0–0.2)

## 2023-10-20 LAB — PROCALCITONIN: Procalcitonin: <0.1 ng/mL

## 2023-10-20 MED ORDER — METHYLPREDNISOLONE SODIUM SUCC 40 MG IJ SOLR
40.0000 mg | INTRAMUSCULAR | Status: DC
  Administered 2023-10-20 – 2023-10-21 (×2): 40 mg via INTRAVENOUS
  Filled 2023-10-20 (×2): qty 1

## 2023-10-20 NOTE — Progress Notes (Signed)
PROGRESS NOTE    Cindy [name]  QMV:[id number] DOB: 21-Dec-1931 DOA: 10/19/2023 PCP: Daisy Floro, MD   Brief Narrative:  89 year old female with history of chronic kidney disease stage IV, iron-deficiency anemia, interstitial lung disease, COPD, hypertension, hyperlipidemia and chronic diastolic heart failure presented with altered mental status and shortness of breath.  On presentation, she was found to be influenza A PCR positive.  CT head without contrast was negative for acute intracranial abnormality.  Chest x-ray showed no active disease.  CTA chest showed no pulmonary embolism but showed nonspecific findings consistent with bronchitis or severe reactive disease such as asthma but no lung mass continue, consolidation, pleural effusion or pneumothorax along with new patchy areas of atelectasis in the bilateral lower lobes.  She was given 1 dose of IV Solu-Medrol and started on IV antibiotics along with oral Tamiflu.  Assessment & Plan:   Acute respiratory failure with hypoxia Influenza A bronchitis/pneumonitis -Still on 1 to 2 L oxygen by nasal cannula.  Wean off as able.  Currently on Tamiflu.  Will continue Solu-Medrol 40 mg daily.  Doubt that this is bacterial pneumonia.  DC antibiotics.  Continue isolation.  Continue current nebs -Temperature max of 102.3 over the last 24 hours  Chronic diastolic heart failure Hypertension -Compensated.  Hydrochlorothiazide on hold.  Continue losartan, Imdur, metoprolol succinate and spironolactone.  Strict input and output.  Daily weights.  Fluid restriction.  Outpatient follow-up with cardiology  Hypothyroidism -Continue levothyroxine  Hyperlipidemia -Resume statin on discharge  Chronic kidney disease stage IIIb -Creatinine currently stable.  Monitor  Lactic acidosis -Resolved -No signs of infection.  DC antibiotics  Thrombocytopenia -Questionable cause.  No signs of bleeding.  Monitor  Anemia of chronic disease Chronic iron  deficiency anemia Macrocytosis -From chronic illnesses.  Hemoglobin stable.  Continue ferrous sulfate  Hypomagnesemia -No labs today.  Monitor  Hypercalcemia -Resolved.  Hydrochlorothiazide on hold  Physical deconditioning -PT eval   DVT prophylaxis: Start SCDs Code Status: Full Family Communication: None at bedside Disposition Plan: Status is: Inpatient Remains inpatient appropriate because: Of severity of illness.  Consultants: None  Procedures: None  Antimicrobials: Rocephin, Zithromax and Tamiflu from 10/19/2023 onwards   Subjective: Patient seen and examined at bedside.  Feels slightly better.  Breathing is improving but still short of breath with exertion.  Still has intermittent cough.  Had fever yesterday.  No vomiting, chest pain, abdominal pain reported. Objective: Vitals:   10/19/23 1832 10/19/23 2003 10/19/23 2111 10/20/23 0425  BP:   (!) 136/49 (!) 153/61  Pulse:   88 68  Resp:   18 19  Temp:   98 F (36.7 C) 98.2 F (36.8 C)  TempSrc:   Oral Oral  SpO2: 99% 98% 97% 96%  Weight:      Height:        Intake/Output Summary (Last 24 hours) at 10/20/2023 0846 Last data filed at 10/20/2023 0216 Gross per 24 hour  Intake 2400.04 ml  Output 600 ml  Net 1800.04 ml   Filed Weights   10/19/23 1600  Weight: 64.5 kg    Examination:  General exam: Appears calm and comfortable.  Elderly female sitting on chair.  On 1 L oxygen via nasal cannula. Respiratory system: Bilateral decreased breath sounds at bases with scattered crackles and wheezing Cardiovascular system: S1 & S2 heard, Rate controlled Gastrointestinal system: Abdomen is nondistended, soft and nontender. Normal bowel sounds heard. Extremities: No cyanosis, clubbing, edema  Central nervous system: Alert and oriented.  Slow to  respond.  No focal neurological deficits. Moving extremities Skin: No rashes, lesions or ulcers Psychiatry: Flat affect.  Not agitated.   Data Reviewed: I have  personally reviewed following labs and imaging studies  CBC: Recent Labs  Lab 10/19/23 1140 10/20/23 0347  WBC 5.9 4.2  NEUTROABS 3.3  --   HGB 11.1* 11.0*  HCT 33.1* 32.7*  MCV 99.4 102.5*  PLT 95* 88*   Basic Metabolic Panel: Recent Labs  Lab 10/19/23 1140 10/19/23 1711 10/20/23 0347  NA 139  --  136  K 3.7  --  3.9  CL 101  --  104  CO2 27  --  23  GLUCOSE 95  --  146*  BUN 31*  --  25*  CREATININE 1.53*  --  1.25*  CALCIUM 10.9*  --  9.4  MG  --  1.1*  --   PHOS  --  2.7  --    GFR: Estimated Creatinine Clearance: 25.6 mL/min (A) (by C-G formula based on SCr of 1.25 mg/dL (H)). Liver Function Tests: Recent Labs  Lab 10/19/23 1140 10/20/23 0347  AST 29 61*  ALT 14 23  ALKPHOS 54 47  BILITOT 0.7 0.5  PROT 7.1 6.4*  ALBUMIN 4.1 3.8   No results for input(s): "LIPASE", "AMYLASE" in the last 168 hours. No results for input(s): "AMMONIA" in the last 168 hours. Coagulation Profile: No results for input(s): "INR", "PROTIME" in the last 168 hours. Cardiac Enzymes: No results for input(s): "CKTOTAL", "CKMB", "CKMBINDEX", "TROPONINI" in the last 168 hours. BNP (last 3 results) No results for input(s): "PROBNP" in the last 8760 hours. HbA1C: No results for input(s): "HGBA1C" in the last 72 hours. CBG: No results for input(s): "GLUCAP" in the last 168 hours. Lipid Profile: No results for input(s): "CHOL", "HDL", "LDLCALC", "TRIG", "CHOLHDL", "LDLDIRECT" in the last 72 hours. Thyroid Function Tests: No results for input(s): "TSH", "T4TOTAL", "FREET4", "T3FREE", "THYROIDAB" in the last 72 hours. Anemia Panel: No results for input(s): "VITAMINB12", "FOLATE", "FERRITIN", "TIBC", "IRON", "RETICCTPCT" in the last 72 hours. Sepsis Labs: Recent Labs  Lab 10/19/23 1140 10/19/23 1354 10/19/23 1711 10/19/23 2000 10/20/23 0347  PROCALCITON  --   --   --   --  <0.10  LATICACIDVEN 2.1* 2.4* 2.7* 1.2  --     Recent Results (from the past 240 hours)  Blood Culture  (routine x 2)     Status: None (Preliminary result)   Collection Time: 10/19/23 11:40 AM   Specimen: BLOOD LEFT HAND  Result Value Ref Range Status   Specimen Description   Final    BLOOD LEFT HAND Performed at [facility], [address] [place], [place] [address]    Special Requests   Final    BOTTLES DRAWN AEROBIC AND ANAEROBIC Blood Culture results may not be optimal due to an inadequate volume of blood received in culture bottles Performed at [facility], [address] [place], [place] [address]    Culture   Final    NO GROWTH < 24 HOURS Performed at [facility], [address], [place], [place] [address]    Report Status PENDING  Incomplete  Resp panel by RT-PCR (RSV, Flu A&B, Covid) Anterior Nasal Swab     Status: Abnormal   Collection Time: 10/19/23  1:19 PM   Specimen: Anterior Nasal Swab  Result Value Ref Range Status   SARS Coronavirus 2 by RT PCR NEGATIVE NEGATIVE Final    Comment: (NOTE) SARS-CoV-2 target nucleic acids  are NOT DETECTED.  The SARS-CoV-2 RNA is generally detectable in upper respiratory specimens during the acute phase of infection. The lowest concentration of SARS-CoV-2 viral copies this assay can detect is 138 copies/mL. A negative result does not preclude SARS-Cov-2 infection and should not be used as the sole basis for treatment or other patient management decisions. A negative result may occur with  improper specimen collection/handling, submission of specimen other than nasopharyngeal swab, presence of viral mutation(s) within the areas targeted by this assay, and inadequate number of viral copies(<138 copies/mL). A negative result must be combined with clinical observations, patient history, and epidemiological information. The expected result is Negative.  Fact Sheet for Patients:  BloggerCourse.com  Fact Sheet for Healthcare Providers:   SeriousBroker.it  This test is no t yet approved or cleared by the [place] FDA and  has been authorized for detection and/or diagnosis of SARS-CoV-2 by FDA under an Emergency Use Authorization (EUA). This EUA will remain  in effect (meaning this test can be used) for the duration of the COVID-19 declaration under Section 564(b)(1) of the Act, 21 U.S.C.section 360bbb-3(b)(1), unless the authorization is terminated  or revoked sooner.       Influenza A by PCR POSITIVE (A) NEGATIVE Final   Influenza B by PCR NEGATIVE NEGATIVE Final    Comment: (NOTE) The Xpert Xpress SARS-CoV-2/FLU/RSV plus assay is intended as an aid in the diagnosis of influenza from Nasopharyngeal swab specimens and should not be used as a sole basis for treatment. Nasal washings and aspirates are unacceptable for Xpert Xpress SARS-CoV-2/FLU/RSV testing.  Fact Sheet for Patients: BloggerCourse.com  Fact Sheet for Healthcare Providers: SeriousBroker.it  This test is not yet approved or cleared by the [place] FDA and has been authorized for detection and/or diagnosis of SARS-CoV-2 by FDA under an Emergency Use Authorization (EUA). This EUA will remain in effect (meaning this test can be used) for the duration of the COVID-19 declaration under Section 564(b)(1) of the Act, 21 U.S.C. section 360bbb-3(b)(1), unless the authorization is terminated or revoked.     Resp Syncytial Virus by PCR NEGATIVE NEGATIVE Final    Comment: (NOTE) Fact Sheet for Patients: BloggerCourse.com  Fact Sheet for Healthcare Providers: SeriousBroker.it  This test is not yet approved or cleared by the [place] FDA and has been authorized for detection and/or diagnosis of SARS-CoV-2 by FDA under an Emergency Use Authorization (EUA). This EUA will remain in effect (meaning this test can be used)  for the duration of the COVID-19 declaration under Section 564(b)(1) of the Act, 21 U.S.C. section 360bbb-3(b)(1), unless the authorization is terminated or revoked.  Performed at [facility], [address], [place], [place] [address]   Blood Culture (routine x 2)     Status: None (Preliminary result)   Collection Time: 10/19/23  6:01 PM   Specimen: BLOOD  Result Value Ref Range Status   Specimen Description   Final    BLOOD BLOOD LEFT HAND AEROBIC BOTTLE ONLY Performed at [facility], [address], [place], [place] [address]    Special Requests   Final    BOTTLES DRAWN AEROBIC ONLY Blood Culture results may not be optimal due to an inadequate volume of blood received in culture bottles Performed at [facility], [address] [place], [place] [address]    Culture   Final    NO GROWTH < 12 HOURS Performed at [facility], [address], [place], [place] [address]  Report Status PENDING  Incomplete         Radiology Studies: CT Angio Chest PE W and/or Wo Contrast Result Date: 10/19/2023 CLINICAL DATA:  Pulmonary embolism (PE) suspected, high prob. Confusion, lethargy. Cough. Cold-like symptoms. EXAM: CT ANGIOGRAPHY CHEST WITH CONTRAST TECHNIQUE: Multidetector CT imaging of the chest was performed using the standard protocol during bolus administration of intravenous contrast. Multiplanar CT image reconstructions and MIPs were obtained to evaluate the vascular anatomy. RADIATION DOSE REDUCTION: This exam was performed according to the departmental dose-optimization program which includes automated exposure control, adjustment of the mA and/or kV according to patient size and/or use of iterative reconstruction technique. CONTRAST:  75mL OMNIPAQUE IOHEXOL 350 MG/ML SOLN COMPARISON:  CT scan chest from 08/02/2023. FINDINGS: Cardiovascular: No evidence of embolism to the proximal subsegmental pulmonary artery level.  Normal cardiac size. No pericardial effusion. No aortic aneurysm. There are coronary artery calcifications, in keeping with coronary artery disease. There are also moderate peripheral atherosclerotic vascular calcifications of thoracic aorta and its major branches. Mediastinum/Nodes: Visualized thyroid gland appears grossly unremarkable. No solid / cystic mediastinal masses. The esophagus is nondistended precluding optimal assessment. There are few mildly prominent mediastinal and hilar lymph nodes, which do not meet the size criteria for lymphadenopathy and appear grossly similar to the prior study, favoring benign etiology. No axillary lymphadenopathy by size criteria. Lungs/Pleura: The central tracheo-bronchial tree is patent. There are occlusive and nonocclusive filling defects in the subsegmental and distal bronchial tree, likely due to mucus/secretions. There is new moderate, smooth, circumferential thickening of the segmental and subsegmental bronchial walls, throughout bilateral lungs, which is nonspecific. Findings are most commonly seen with bronchitis or reactive airway disease, such as asthma. There are patchy areas of linear, plate-like atelectasis and/or scarring throughout bilateral lungs, with asymmetric involvement of the bilateral lower lobes. Redemonstration of mild peripheral/subpleural reticulations predominantly in the bilateral lower lobes with associated patchy areas of bronchiectasis. No honeycombing. Minimal paraseptal emphysematous changes noted. Findings favor probable UIP pattern. The no mass or consolidation. No pleural effusion or pneumothorax. There is a stable sub 4 mm solid noncalcified nodule in the middle lobe, stable since the prior study from 10/12/2020. No new or suspicious lung nodules. Upper Abdomen: There is a stable subcentimeter sized hypoattenuating structure in the right kidney upper pole, posteriorly. Remaining visualized upper abdominal viscera within normal limits.  Musculoskeletal: The visualized soft tissues of the chest wall are grossly unremarkable. No suspicious osseous lesions. There are mild to moderate multilevel degenerative changes in the visualized spine. Redemonstration of mild-to-moderate anterior wedging deformity of T5 vertebral body, similar to the prior study. No significant retropulsion or spinal canal compromise. Redemonstration of partial fusion of T12-L1 vertebral bodies. Review of the MIP images confirms the above findings. IMPRESSION: 1. No embolism to the proximal subsegmental pulmonary artery level. 2. New moderate thickening of the walls of the bronchial tree, nonspecific but most commonly seen with bronchitis or reactive airway disease such as asthma. 3. No lung mass, consolidation, pleural effusion or pneumothorax. New patchy area of atelectasis predominantly in the bilateral lower lobes. 4. Multiple other nonacute observations, as described above. Aortic Atherosclerosis (ICD10-I70.0). Electronically Signed   By: Jules Schick M.D.   On: 10/19/2023 15:03   CT Head Wo Contrast Result Date: 10/19/2023 CLINICAL DATA:  Confusion and lethargy beginning yesterday. Mental status change. EXAM: CT HEAD WITHOUT CONTRAST TECHNIQUE: Contiguous axial images were obtained from the base of the skull through the vertex without intravenous contrast. RADIATION DOSE REDUCTION: This exam  was performed according to the departmental dose-optimization program which includes automated exposure control, adjustment of the mA and/or kV according to patient size and/or use of iterative reconstruction technique. COMPARISON:  CT head without contrast 03/11/2023 FINDINGS: Brain: No acute infarct, hemorrhage, or mass lesion is present. Stable mild atrophy and white matter changes are likely within normal limits for age. Remote white matter infarct is again noted in the left parietal lobe. Deep brain nuclei are within normal limits. The ventricles are of normal size. No  significant extraaxial fluid collection is present. The brainstem and cerebellum are within normal limits. Midline structures are within normal limits. Vascular: Atherosclerotic calcifications are again noted within the cavernous internal carotid arteries bilaterally. No hyperdense vessel is present. Skull: Calvarium is intact. No focal lytic or blastic lesions are present. No significant extracranial soft tissue lesion is present. Sinuses/Orbits: The paranasal sinuses and mastoid air cells are clear. The globes and orbits are within normal limits. IMPRESSION: 1. No acute intracranial abnormality or significant interval change. 2. Stable mild atrophy and white matter changes are likely within normal limits for age. 3. 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 Electronically Signed   By: Marin Roberts M.D.   On: 10/19/2023 11:10   DG Chest Port 1 View Result Date: 10/19/2023 CLINICAL DATA:  Cough for 2 days. EXAM: PORTABLE CHEST 1 VIEW COMPARISON:  Mar 11, 2023. FINDINGS: The heart size and mediastinal contours are within normal limits. Sternotomy wires are noted. No acute pulmonary disease. The visualized skeletal structures are unremarkable. IMPRESSION: No active disease. Electronically Signed   By: Lupita Raider M.D.   On: 10/19/2023 11:04        Scheduled Meds:  albuterol  2.5 mg Nebulization TID   aspirin EC  81 mg Oral Daily   ferrous sulfate  325 mg Oral Q breakfast   isosorbide mononitrate  90 mg Oral Daily   levothyroxine  175 mcg Oral QPM   losartan  50 mg Oral Daily   methylPREDNISolone (SOLU-MEDROL) injection  40 mg Intravenous Q24H   metoprolol succinate  25 mg Oral Daily   montelukast  10 mg Oral QHS   oseltamivir  30 mg Oral Daily   pantoprazole  40 mg Oral BID   spironolactone  25 mg Oral Daily   Continuous Infusions:  azithromycin     cefTRIAXone (ROCEPHIN)  IV            Glade Lloyd, MD [facility] 10/20/2023, 8:46 AM

## 2023-10-20 NOTE — Evaluation (Signed)
Physical Therapy Evaluation Patient Details Name: Cindy [name] MRN: [id number] DOB: 1931/12/29 Today's Date: 10/20/2023  History of Present Illness  89 yo female admitted with flu+, acute resp failure. Hx of ILD, COPD, CHF.  Clinical Impression  On eval, pt required Min A for mobility. She walked ~75 feet with a RW. O2 94% on RA. Total assist for toileting hygiene. Pt is unsteady and at risk for falls when ambulating. Recommend RW use for ambulation safety and HHPT f/u, if pt is agreeable. Will plan to follow pt during this hospital stay.  Recommend daily ambulation with nursing and/or mobility team as able.       If plan is discharge home, recommend the following: A little help with walking and/or transfers;A little help with bathing/dressing/bathroom;Assistance with cooking/housework;Assist for transportation;Help with stairs or ramp for entrance   Can travel by private vehicle        Equipment Recommendations None recommended by PT  Recommendations for Other Services       Functional Status Assessment Patient has had a recent decline in their functional status and demonstrates the ability to make significant improvements in function in a reasonable and predictable amount of time.     Precautions / Restrictions Precautions Precautions: Fall Precaution Comments: "let me do it"-doesn't like to be helped Restrictions Weight Bearing Restrictions Per Provider Order: No      Mobility  Bed Mobility Overal bed mobility: Needs Assistance Bed Mobility: Supine to Sit, Sit to Supine     Supine to sit: Supervision, HOB elevated, Used rails Sit to supine: Supervision, HOB elevated, Used rails   General bed mobility comments: Increased time.    Transfers Overall transfer level: Needs assistance Equipment used: None Transfers: Sit to/from Stand Sit to Stand: Min assist           General transfer comment: Assist to steady.    Ambulation/Gait Ambulation/Gait assistance:  Min assist Gait Distance (Feet): 75 Feet Assistive device: None Gait Pattern/deviations: Step-through pattern, Staggering right, Staggering left, Drifts right/left       General Gait Details: Assist to steady intermittently. Pt will reach out to objects in environment to regain balance but reaction is slow.  Pt tolerated distance well. O2 94% on RA  Stairs            Wheelchair Mobility     Tilt Bed    Modified Rankin (Stroke Patients Only)       Balance Overall balance assessment: Needs assistance         Standing balance support: During functional activity Standing balance-Leahy Scale: Fair                               Pertinent Vitals/Pain Pain Assessment Pain Assessment: No/denies pain    Home Living Family/patient expects to be discharged to:: Private residence Living Arrangements: Spouse/significant other Available Help at Discharge: Family Type of Home: Apartment Home Access: Stairs to enter Entrance Stairs-Rails: None Entrance Stairs-Number of Steps: 1   Home Layout: One level Home Equipment: Educational psychologist (2 wheels);Rollator (4 wheels);Cane - quad;BSC/3in1;Shower seat - built in;Grab bars - tub/shower      Prior Function Prior Level of Function : Independent/Modified Independent             Mobility Comments: Mod I no AD in home ADLs Comments: Ind     Extremity/Trunk Assessment   Upper Extremity Assessment Upper Extremity Assessment: Defer to OT evaluation  Lower Extremity Assessment Lower Extremity Assessment: Generalized weakness    Cervical / Trunk Assessment Cervical / Trunk Assessment: Normal  Communication   Communication Communication: No apparent difficulties  Cognition Arousal: Alert Behavior During Therapy: WFL for tasks assessed/performed Overall Cognitive Status: Within Functional Limits for tasks assessed                                          General Comments       Exercises     Assessment/Plan    PT Assessment Patient needs continued PT services  PT Problem List Decreased activity tolerance;Decreased balance;Decreased mobility       PT Treatment Interventions DME instruction;Gait training;Functional mobility training;Therapeutic activities;Therapeutic exercise;Patient/family education;Stair training;Balance training    PT Goals (Current goals can be found in the Care Plan section)  Acute Rehab PT Goals Patient Stated Goal: home PT Goal Formulation: With patient/family Time For Goal Achievement: 11/03/23 Potential to Achieve Goals: Good    Frequency Min 1X/week     Co-evaluation               AM-PAC PT "6 Clicks" Mobility  Outcome Measure Help needed turning from your back to your side while in a flat bed without using bedrails?: None Help needed moving from lying on your back to sitting on the side of a flat bed without using bedrails?: None Help needed moving to and from a bed to a chair (including a wheelchair)?: A Little Help needed standing up from a chair using your arms (e.g., wheelchair or bedside chair)?: A Little Help needed to walk in hospital room?: A Little Help needed climbing 3-5 steps with a railing? : A Little 6 Click Score: 20    End of Session Equipment Utilized During Treatment: Gait belt Activity Tolerance: Patient tolerated treatment well Patient left: in bed;with call bell/phone within reach;with bed alarm set;with family/visitor present   PT Visit Diagnosis: Other abnormalities of gait and mobility (R26.89)    Time: 2841-3244 PT Time Calculation (min) (ACUTE ONLY): 30 min   Charges:   PT Evaluation $PT Eval Low Complexity: 1 Low PT Treatments $Gait Training: 8-22 mins PT General Charges $$ ACUTE PT VISIT: 1 Visit           Faye Ramsay, PT Acute Rehabilitation  Office: [phone]

## 2023-10-20 NOTE — Plan of Care (Signed)
°  Problem: Clinical Measurements: Goal: Diagnostic test results will improve Outcome: Progressing Goal: Respiratory complications will improve Outcome: Progressing Goal: Cardiovascular complication will be avoided Outcome: Progressing   Problem: Safety: Goal: Ability to remain free from injury will improve Outcome: Progressing   Problem: Clinical Measurements: Goal: Ability to maintain a body temperature in the normal range will improve Outcome: Progressing   Problem: Respiratory: Goal: Ability to maintain adequate ventilation will improve Outcome: Progressing Goal: Ability to maintain a clear airway will improve Outcome: Progressing

## 2023-10-21 DIAGNOSIS — J9601 Acute respiratory failure with hypoxia: Secondary | ICD-10-CM | POA: Diagnosis not present

## 2023-10-21 LAB — BASIC METABOLIC PANEL
Anion gap: 9 (ref 5–15)
BUN: 38 mg/dL — ABNORMAL HIGH (ref 8–23)
CO2: 22 mmol/L (ref 22–32)
Calcium: 8.6 mg/dL — ABNORMAL LOW (ref 8.9–10.3)
Chloride: 105 mmol/L (ref 98–111)
Creatinine, Ser: 1.38 mg/dL — ABNORMAL HIGH (ref 0.44–1.00)
GFR, Estimated: 37 mL/min — ABNORMAL LOW (ref >60)
Glucose, Bld: 126 mg/dL — ABNORMAL HIGH (ref 70–99)
Potassium: 3.8 mmol/L (ref 3.5–5.1)
Sodium: 136 mmol/L (ref 135–145)

## 2023-10-21 LAB — CBC WITH DIFFERENTIAL/PLATELET
Abs Immature Granulocytes: 0.03 10*3/uL (ref 0.00–0.07)
Basophils Absolute: 0 10*3/uL (ref 0.0–0.1)
Basophils Relative: 0 %
Eosinophils Absolute: 0 10*3/uL (ref 0.0–0.5)
Eosinophils Relative: 0 %
HCT: 29.2 % — ABNORMAL LOW (ref 36.0–46.0)
Hemoglobin: 10.1 g/dL — ABNORMAL LOW (ref 12.0–15.0)
Immature Granulocytes: 0 %
Lymphocytes Relative: 7 %
Lymphs Abs: 0.5 10*3/uL — ABNORMAL LOW (ref 0.7–4.0)
MCH: 34.2 pg — ABNORMAL HIGH (ref 26.0–34.0)
MCHC: 34.6 g/dL (ref 30.0–36.0)
MCV: 99 fL (ref 80.0–100.0)
Monocytes Absolute: 0.8 10*3/uL (ref 0.1–1.0)
Monocytes Relative: 10 %
Neutro Abs: 6.6 10*3/uL (ref 1.7–7.7)
Neutrophils Relative %: 83 %
Platelets: 96 10*3/uL — ABNORMAL LOW (ref 150–400)
RBC: 2.95 MIL/uL — ABNORMAL LOW (ref 3.87–5.11)
RDW: 12.8 % (ref 11.5–15.5)
WBC: 8 10*3/uL (ref 4.0–10.5)
nRBC: 0 % (ref 0.0–0.2)

## 2023-10-21 LAB — MAGNESIUM: Magnesium: 1.8 mg/dL (ref 1.7–2.4)

## 2023-10-21 MED ORDER — OSELTAMIVIR PHOSPHATE 30 MG PO CAPS: 30.0000 mg | ORAL_CAPSULE | Freq: Every day | ORAL | 0 refills | Status: AC

## 2023-10-21 MED ORDER — PREDNISONE 20 MG PO TABS: 40.0000 mg | ORAL_TABLET | Freq: Every day | ORAL | 0 refills | Status: AC

## 2023-10-21 NOTE — Discharge Summary (Signed)
Physician Discharge Summary  [name] EXB:[id number] DOB: 01/11/1932 DOA: 10/19/2023  PCP: Daisy Floro, MD   Admit date: 10/19/2023  Discharge date: 10/21/2023  Admitted From: Home.  Disposition:  Home Health  Recommendations for Outpatient Follow-up:  Follow up with PCP in 1-2 weeks. Please obtain BMP/CBC in one week. Advised to continue Tamiflu for 3 more days to complete 5-day course. Advised to take prednisone 40 mg daily for 3 more days for bronchospasm. Continue Nebulized bronchodilators as needed at home.  Home Health: Home PT/OT Equipment/Devices: None  Discharge Condition: Stable CODE STATUS:Full code Diet recommendation: Heart Healthy   Brief [facility] Course: This 89 year old female with history of chronic kidney disease stage IV, iron-deficiency anemia, interstitial lung disease, COPD, hypertension, hyperlipidemia and chronic diastolic heart failure presented in the ED with altered mental status and shortness of breath.  On presentation, she was found to be influenza A PCR positive.  CT head without contrast was negative for acute intracranial abnormality.  Chest x-ray showed no active disease.  CTA chest showed no pulmonary embolism but showed nonspecific findings consistent with bronchitis or severe reactive disease such as asthma but no lung mass , consolidation, pleural effusion or pneumothorax along with new patchy areas of atelectasis in the bilateral lower lobes. She was given 1 dose of IV Solu-Medrol and started on IV antibiotics along with oral Tamiflu.  Patient has made significant improvement with above treatment.  Subsequently antibiotics were discontinued,  It was secondary to viral pneumonitis.  She weaned down successfully to room air.  Patient has participated with physical therapy, does not require oxygen with exertion.  Patient feels better and wants to be discharged.  Patient being discharged on Tamiflu and prednisone for 3  days.  Discharge Diagnoses:  Principal Problem:   Acute respiratory failure with hypoxia (HCC) Active Problems:   Chronic iron deficiency anemia   Chronic kidney disease, stage 3b (HCC)   HLD (hyperlipidemia)   Thrombocytopenia (HCC)   Acquired hypothyroidism   Essential hypertension   Chronic diastolic CHF (congestive heart failure) (HCC)   Hypomagnesemia   Hypercalcemia   Lactic acidosis   Influenza A  Acute respiratory failure with hypoxia Influenza A bronchitis/pneumonitis Successfully weaned down to room air.  Currently on Tamiflu.   Initiated on Solu-Medrol 40 mg daily, transitioned to prednisone. Doubt that this is bacterial pneumonia.  DC antibiotics.   Continue isolation.  Continue current nebs She remained afebrile within last 24 to 36 hours.   Chronic diastolic heart failure: Hypertension -Compensated.  Hydrochlorothiazide on hold.   Continue losartan, Imdur, metoprolol succinate and spironolactone.   Strict input and output.  Daily weights.  Fluid restriction.   Outpatient follow-up with cardiology   Hypothyroidism: -Continue levothyroxine   Hyperlipidemia -Resume statin on discharge   Chronic kidney disease stage IIIb: -Creatinine currently stable.  Monitor   Lactic acidosis -Resolved -No signs of infection.  DC antibiotics   Thrombocytopenia -Questionable cause.  No signs of bleeding.  Monitor   Anemia of chronic disease Chronic iron deficiency anemia Macrocytosis -From chronic illnesses.  Hemoglobin stable.  Continue ferrous sulfate   Hypomagnesemia -No labs today.  Monitor   Hypercalcemia -Resolved.  Hydrochlorothiazide on hold   Physical deconditioning -PT eval > HHPT/OT  Discharge Instructions  Discharge Instructions     Call MD for:  difficulty breathing, headache or visual disturbances   Complete by: As directed    Call MD for:  persistant dizziness or light-headedness   Complete by: As directed  Call MD for:  persistant  nausea and vomiting   Complete by: As directed    Diet - low sodium heart healthy   Complete by: As directed    Diet Carb Modified   Complete by: As directed    Discharge instructions   Complete by: As directed    Advised to follow-up with primary care physician in 1 week. Advised to continue Tamiflu for 3 more days to complete 5-day course. Advised to take prednisone 40 mg daily for 3 more days for bronchospasm. Continue nebulized bronchodilators as needed at home.   Increase activity slowly   Complete by: As directed       Allergies as of 10/21/2023       Reactions   Codeine Other (See Comments)   Headache from heavy doses of codeine only        Medication List     STOP taking these medications    Biotin 5 MG Caps   dextromethorphan 30 MG/5ML liquid Commonly known as: DELSYM       TAKE these medications    albuterol 108 (90 Base) MCG/ACT inhaler Commonly known as: VENTOLIN HFA Inhale 1 puff into the lungs every 4 (four) hours as needed for wheezing or shortness of breath.   aspirin 81 MG tablet Take 81 mg by mouth daily.   atorvastatin 80 MG tablet Commonly known as: LIPITOR Take 1 tablet (80 mg total) by mouth daily.   benzonatate 200 MG capsule Commonly known as: TESSALON Take 1 capsule by mouth twice daily as needed for cough What changed:  how much to take how to take this when to take this additional instructions   Calcium Carbonate-Vitamin D 600-200 MG-UNIT Tabs Take 1 tablet by mouth 2 (two) times daily.   ferrous sulfate 325 (65 FE) MG tablet Take 325 mg by mouth daily with breakfast.   fexofenadine 180 MG tablet Commonly known as: ALLEGRA Take 1 tablet (180 mg total) by mouth daily.   guaifenesin 400 MG Tabs tablet Commonly known as: HUMIBID E Take 400 mg by mouth in the morning and at bedtime.   ICAPS AREDS 2 PO Take 1 capsule by mouth in the morning and at bedtime.   ipratropium 0.06 % nasal spray Commonly known as:  ATROVENT Place 2 sprays into both nostrils 3 (three) times daily as needed for rhinitis.   isosorbide mononitrate 30 MG 24 hr tablet Commonly known as: IMDUR Take 90 mg by mouth daily.   levothyroxine 175 MCG tablet Commonly known as: SYNTHROID Take 175 mcg by mouth every evening.   losartan 50 MG tablet Commonly known as: COZAAR Take 50 mg by mouth daily.   metoprolol succinate 25 MG 24 hr tablet Commonly known as: TOPROL-XL Take 1 tablet (25 mg total) by mouth daily.   montelukast 10 MG tablet Commonly known as: SINGULAIR Take 10 mg by mouth at bedtime.   NICOTINAMIDE PO Take 1 capsule by mouth 2 (two) times daily.   nitroGLYCERIN 0.4 MG SL tablet Commonly known as: NITROSTAT PLACE 1 TABLET UNDER THE TONGUE EVERY 5 MINUTES AS NEEDED FOR CHEST PAIN What changed: See the new instructions.   oseltamivir 30 MG capsule Commonly known as: TAMIFLU Take 1 capsule (30 mg total) by mouth daily for 2 days. Start taking on: October 22, 2023   pantoprazole 40 MG tablet Commonly known as: PROTONIX Take 40 mg by mouth 2 (two) times daily.   predniSONE 20 MG tablet Commonly known as: DELTASONE Take 2 tablets (40  mg total) by mouth daily with breakfast for 3 days.   spironolactone-hydrochlorothiazide 25-25 MG tablet Commonly known as: ALDACTAZIDE Take 1 tablet by mouth daily.   TART CHERRY PO Take 1,000 mg by mouth 2 (two) times a week.   traMADol 50 MG tablet Commonly known as: ULTRAM Take 50 mg by mouth 2 (two) times daily.        Follow-up Information     Daisy Floro, MD Follow up in 1 week(s).   Specialty: Family Medicine Contact information: [address] [place] [address] [phone]                Allergies  Allergen Reactions   Codeine Other (See Comments)    Headache from heavy doses of codeine only    Consultations: None   Procedures/Studies: CT Angio Chest PE W and/or Wo Contrast Result Date: 10/19/2023 CLINICAL  DATA:  Pulmonary embolism (PE) suspected, high prob. Confusion, lethargy. Cough. Cold-like symptoms. EXAM: CT ANGIOGRAPHY CHEST WITH CONTRAST TECHNIQUE: Multidetector CT imaging of the chest was performed using the standard protocol during bolus administration of intravenous contrast. Multiplanar CT image reconstructions and MIPs were obtained to evaluate the vascular anatomy. RADIATION DOSE REDUCTION: This exam was performed according to the departmental dose-optimization program which includes automated exposure control, adjustment of the mA and/or kV according to patient size and/or use of iterative reconstruction technique. CONTRAST:  75mL OMNIPAQUE IOHEXOL 350 MG/ML SOLN COMPARISON:  CT scan chest from 08/02/2023. FINDINGS: Cardiovascular: No evidence of embolism to the proximal subsegmental pulmonary artery level. Normal cardiac size. No pericardial effusion. No aortic aneurysm. There are coronary artery calcifications, in keeping with coronary artery disease. There are also moderate peripheral atherosclerotic vascular calcifications of thoracic aorta and its major branches. Mediastinum/Nodes: Visualized thyroid gland appears grossly unremarkable. No solid / cystic mediastinal masses. The esophagus is nondistended precluding optimal assessment. There are few mildly prominent mediastinal and hilar lymph nodes, which do not meet the size criteria for lymphadenopathy and appear grossly similar to the prior study, favoring benign etiology. No axillary lymphadenopathy by size criteria. Lungs/Pleura: The central tracheo-bronchial tree is patent. There are occlusive and nonocclusive filling defects in the subsegmental and distal bronchial tree, likely due to mucus/secretions. There is new moderate, smooth, circumferential thickening of the segmental and subsegmental bronchial walls, throughout bilateral lungs, which is nonspecific. Findings are most commonly seen with bronchitis or reactive airway disease, such as  asthma. There are patchy areas of linear, plate-like atelectasis and/or scarring throughout bilateral lungs, with asymmetric involvement of the bilateral lower lobes. Redemonstration of mild peripheral/subpleural reticulations predominantly in the bilateral lower lobes with associated patchy areas of bronchiectasis. No honeycombing. Minimal paraseptal emphysematous changes noted. Findings favor probable UIP pattern. The no mass or consolidation. No pleural effusion or pneumothorax. There is a stable sub 4 mm solid noncalcified nodule in the middle lobe, stable since the prior study from 10/12/2020. No new or suspicious lung nodules. Upper Abdomen: There is a stable subcentimeter sized hypoattenuating structure in the right kidney upper pole, posteriorly. Remaining visualized upper abdominal viscera within normal limits. Musculoskeletal: The visualized soft tissues of the chest wall are grossly unremarkable. No suspicious osseous lesions. There are mild to moderate multilevel degenerative changes in the visualized spine. Redemonstration of mild-to-moderate anterior wedging deformity of T5 vertebral body, similar to the prior study. No significant retropulsion or spinal canal compromise. Redemonstration of partial fusion of T12-L1 vertebral bodies. Review of the MIP images confirms the above findings. IMPRESSION: 1. No embolism  to the proximal subsegmental pulmonary artery level. 2. New moderate thickening of the walls of the bronchial tree, nonspecific but most commonly seen with bronchitis or reactive airway disease such as asthma. 3. No lung mass, consolidation, pleural effusion or pneumothorax. New patchy area of atelectasis predominantly in the bilateral lower lobes. 4. Multiple other nonacute observations, as described above. Aortic Atherosclerosis (ICD10-I70.0). Electronically Signed   By: Jules Schick M.D.   On: 10/19/2023 15:03   CT Head Wo Contrast Result Date: 10/19/2023 CLINICAL DATA:  Confusion and  lethargy beginning yesterday. Mental status change. EXAM: CT HEAD WITHOUT CONTRAST TECHNIQUE: Contiguous axial images were obtained from the base of the skull through the vertex without intravenous contrast. RADIATION DOSE REDUCTION: This exam was performed according to the departmental dose-optimization program which includes automated exposure control, adjustment of the mA and/or kV according to patient size and/or use of iterative reconstruction technique. COMPARISON:  CT head without contrast 03/11/2023 FINDINGS: Brain: No acute infarct, hemorrhage, or mass lesion is present. Stable mild atrophy and white matter changes are likely within normal limits for age. Remote white matter infarct is again noted in the left parietal lobe. Deep brain nuclei are within normal limits. The ventricles are of normal size. No significant extraaxial fluid collection is present. The brainstem and cerebellum are within normal limits. Midline structures are within normal limits. Vascular: Atherosclerotic calcifications are again noted within the cavernous internal carotid arteries bilaterally. No hyperdense vessel is present. Skull: Calvarium is intact. No focal lytic or blastic lesions are present. No significant extracranial soft tissue lesion is present. Sinuses/Orbits: The paranasal sinuses and mastoid air cells are clear. The globes and orbits are within normal limits. IMPRESSION: 1. No acute intracranial abnormality or significant interval change. 2. Stable mild atrophy and white matter changes are likely within normal limits for age. 3. 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 Electronically Signed   By: Marin Roberts M.D.   On: 10/19/2023 11:10   DG Chest Port 1 View Result Date: 10/19/2023 CLINICAL DATA:  Cough for 2 days. EXAM: PORTABLE CHEST 1 VIEW COMPARISON:  Mar 11, 2023. FINDINGS: The heart size and mediastinal contours are within normal limits. Sternotomy wires are noted. No acute pulmonary  disease. The visualized skeletal structures are unremarkable. IMPRESSION: No active disease. Electronically Signed   By: Lupita Raider M.D.   On: 10/19/2023 11:04     Subjective: Patient was seen and examined at bedside.  Overnight events noted.   Patient report doing much better and wants to be discharged.  Discharge Exam: Vitals:   10/20/23 2114 10/21/23 0435  BP:  (!) 140/44  Pulse:  70  Resp:  20  Temp:  98.3 F (36.8 C)  SpO2: 97% 97%   Vitals:   10/20/23 2027 10/20/23 2114 10/21/23 0435 10/21/23 0500  BP: (!) 141/47  (!) 140/44   Pulse: 69  70   Resp: 16  20   Temp: 98.4 F (36.9 C)  98.3 F (36.8 C)   TempSrc: Oral  Oral   SpO2: 95% 97% 97%   Weight:    66 kg  Height:        General: Pt is alert, awake, not in acute distress Cardiovascular: RRR, S1/S2 +, no rubs, no gallops Respiratory: CTA bilaterally, no wheezing, no rhonchi Abdominal: Soft, NT, ND, bowel sounds + Extremities: no edema, no cyanosis    The results of significant diagnostics from this hospitalization (including imaging, microbiology, ancillary and laboratory) are listed  below for reference.     Microbiology: Recent Results (from the past 240 hours)  Blood Culture (routine x 2)     Status: None (Preliminary result)   Collection Time: 10/19/23 11:40 AM   Specimen: BLOOD LEFT HAND  Result Value Ref Range Status   Specimen Description   Final    BLOOD LEFT HAND Performed at [facility], [address], [place], [place] [address]    Special Requests   Final    BOTTLES DRAWN AEROBIC AND ANAEROBIC Blood Culture results may not be optimal due to an inadequate volume of blood received in culture bottles Performed at [facility], [address] [place], [place] [address]    Culture   Final    NO GROWTH 2 DAYS Performed at [facility], [address], [place], [place] [address]    Report Status PENDING  Incomplete  Resp panel by RT-PCR (RSV,  Flu A&B, Covid) Anterior Nasal Swab     Status: Abnormal   Collection Time: 10/19/23  1:19 PM   Specimen: Anterior Nasal Swab  Result Value Ref Range Status   SARS Coronavirus 2 by RT PCR NEGATIVE NEGATIVE Final    Comment: (NOTE) SARS-CoV-2 target nucleic acids are NOT DETECTED.  The SARS-CoV-2 RNA is generally detectable in upper respiratory specimens during the acute phase of infection. The lowest concentration of SARS-CoV-2 viral copies this assay can detect is 138 copies/mL. A negative result does not preclude SARS-Cov-2 infection and should not be used as the sole basis for treatment or other patient management decisions. A negative result may occur with  improper specimen collection/handling, submission of specimen other than nasopharyngeal swab, presence of viral mutation(s) within the areas targeted by this assay, and inadequate number of viral copies(<138 copies/mL). A negative result must be combined with clinical observations, patient history, and epidemiological information. The expected result is Negative.  Fact Sheet for Patients:  BloggerCourse.com  Fact Sheet for Healthcare Providers:  SeriousBroker.it  This test is no t yet approved or cleared by the [place] FDA and  has been authorized for detection and/or diagnosis of SARS-CoV-2 by FDA under an Emergency Use Authorization (EUA). This EUA will remain  in effect (meaning this test can be used) for the duration of the COVID-19 declaration under Section 564(b)(1) of the Act, 21 U.S.C.section 360bbb-3(b)(1), unless the authorization is terminated  or revoked sooner.       Influenza A by PCR POSITIVE (A) NEGATIVE Final   Influenza B by PCR NEGATIVE NEGATIVE Final    Comment: (NOTE) The Xpert Xpress SARS-CoV-2/FLU/RSV plus assay is intended as an aid in the diagnosis of influenza from Nasopharyngeal swab specimens and should not be used as a sole basis for  treatment. Nasal washings and aspirates are unacceptable for Xpert Xpress SARS-CoV-2/FLU/RSV testing.  Fact Sheet for Patients: BloggerCourse.com  Fact Sheet for Healthcare Providers: SeriousBroker.it  This test is not yet approved or cleared by the [place] FDA and has been authorized for detection and/or diagnosis of SARS-CoV-2 by FDA under an Emergency Use Authorization (EUA). This EUA will remain in effect (meaning this test can be used) for the duration of the COVID-19 declaration under Section 564(b)(1) of the Act, 21 U.S.C. section 360bbb-3(b)(1), unless the authorization is terminated or revoked.     Resp Syncytial Virus by PCR NEGATIVE NEGATIVE Final    Comment: (NOTE) Fact Sheet for Patients: BloggerCourse.com  Fact Sheet for Healthcare Providers: SeriousBroker.it  This test is not yet approved  or cleared by the [place] and has been authorized for detection and/or diagnosis of SARS-CoV-2 by FDA under an Emergency Use Authorization (EUA). This EUA will remain in effect (meaning this test can be used) for the duration of the COVID-19 declaration under Section 564(b)(1) of the Act, 21 U.S.C. section 360bbb-3(b)(1), unless the authorization is terminated or revoked.  Performed at [facility], [address], [place], [place] [address]   Blood Culture (routine x 2)     Status: None (Preliminary result)   Collection Time: 10/19/23  6:01 PM   Specimen: BLOOD LEFT HAND  Result Value Ref Range Status   Specimen Description   Final    BLOOD LEFT HAND Performed at [facility], [address], [place], [place] [address]    Special Requests   Final    BOTTLES DRAWN AEROBIC ONLY Blood Culture results may not be optimal due to an inadequate volume of blood received in culture bottles Performed at [facility], [address] [place], [place] [address]    Culture   Final    NO GROWTH 2 DAYS Performed at [facility], [address], [place], [place] [address]    Report Status PENDING  Incomplete     Labs: BNP (last 3 results) Recent Labs    03/11/23 1947  BNP 504.3*   Basic Metabolic Panel: Recent Labs  Lab 10/19/23 1140 10/19/23 1711 10/20/23 0347 10/21/23 0338  NA 139  --  136 136  K 3.7  --  3.9 3.8  CL 101  --  104 105  CO2 27  --  23 22  GLUCOSE 95  --  146* 126*  BUN 31*  --  25* 38*  CREATININE 1.53*  --  1.25* 1.38*  CALCIUM 10.9*  --  9.4 8.6*  MG  --  1.1*  --  1.8  PHOS  --  2.7  --   --    Liver Function Tests: Recent Labs  Lab 10/19/23 1140 10/20/23 0347  AST 29 61*  ALT 14 23  ALKPHOS 54 47  BILITOT 0.7 0.5  PROT 7.1 6.4*  ALBUMIN 4.1 3.8   No results for input(s): "LIPASE", "AMYLASE" in the last 168 hours. No results for input(s): "AMMONIA" in the last 168 hours. CBC: Recent Labs  Lab 10/19/23 1140 10/20/23 0347 10/21/23 0338  WBC 5.9 4.2 8.0  NEUTROABS 3.3  --  6.6  HGB 11.1* 11.0* 10.1*  HCT 33.1* 32.7* 29.2*  MCV 99.4 102.5* 99.0  PLT 95* 88* 96*   Cardiac Enzymes: No results for input(s): "CKTOTAL", "CKMB", "CKMBINDEX", "TROPONINI" in the last 168 hours. BNP: Invalid input(s): "POCBNP" CBG: No results for input(s): "GLUCAP" in the last 168 hours. D-Dimer No results for input(s): "DDIMER" in the last 72 hours. Hgb A1c No results for input(s): "HGBA1C" in the last 72 hours. Lipid Profile No results for input(s): "CHOL", "HDL", "LDLCALC", "TRIG", "CHOLHDL", "LDLDIRECT" in the last 72 hours. Thyroid function studies No results for input(s): "TSH", "T4TOTAL", "T3FREE", "THYROIDAB" in the last 72 hours.  Invalid input(s): "FREET3" Anemia work up No results for input(s): "VITAMINB12", "FOLATE", "FERRITIN", "TIBC", "IRON", "RETICCTPCT" in the last 72 hours. Urinalysis    Component Value Date/Time   COLORURINE YELLOW 10/19/2023 1030    APPEARANCEUR CLEAR 10/19/2023 1030   LABSPEC 1.016 10/19/2023 1030   PHURINE 5.5 10/19/2023 1030   GLUCOSEU NEGATIVE 10/19/2023 1030   HGBUR TRACE (A) 10/19/2023 1030   BILIRUBINUR  NEGATIVE 10/19/2023 1030   KETONESUR NEGATIVE 10/19/2023 1030   PROTEINUR 30 (A) 10/19/2023 1030   NITRITE POSITIVE (A) 10/19/2023 1030   LEUKOCYTESUR NEGATIVE 10/19/2023 1030   Sepsis Labs Recent Labs  Lab 10/19/23 1140 10/20/23 0347 10/21/23 0338  WBC 5.9 4.2 8.0   Microbiology Recent Results (from the past 240 hours)  Blood Culture (routine x 2)     Status: None (Preliminary result)   Collection Time: 10/19/23 11:40 AM   Specimen: BLOOD LEFT HAND  Result Value Ref Range Status   Specimen Description   Final    BLOOD LEFT HAND Performed at [facility], [address] [place], [place] [address]    Special Requests   Final    BOTTLES DRAWN AEROBIC AND ANAEROBIC Blood Culture results may not be optimal due to an inadequate volume of blood received in culture bottles Performed at [facility], [address] [place], [place] [address]    Culture   Final    NO GROWTH 2 DAYS Performed at [facility], [address], [place], [place] [address]    Report Status PENDING  Incomplete  Resp panel by RT-PCR (RSV, Flu A&B, Covid) Anterior Nasal Swab     Status: Abnormal   Collection Time: 10/19/23  1:19 PM   Specimen: Anterior Nasal Swab  Result Value Ref Range Status   SARS Coronavirus 2 by RT PCR NEGATIVE NEGATIVE Final    Comment: (NOTE) SARS-CoV-2 target nucleic acids are NOT DETECTED.  The SARS-CoV-2 RNA is generally detectable in upper respiratory specimens during the acute phase of infection. The lowest concentration of SARS-CoV-2 viral copies this assay can detect is 138 copies/mL. A negative result does not preclude SARS-Cov-2 infection and should not be used as the sole basis for treatment or other patient management decisions. A negative  result may occur with  improper specimen collection/handling, submission of specimen other than nasopharyngeal swab, presence of viral mutation(s) within the areas targeted by this assay, and inadequate number of viral copies(<138 copies/mL). A negative result must be combined with clinical observations, patient history, and epidemiological information. The expected result is Negative.  Fact Sheet for Patients:  BloggerCourse.com  Fact Sheet for Healthcare Providers:  SeriousBroker.it  This test is no t yet approved or cleared by the [place] FDA and  has been authorized for detection and/or diagnosis of SARS-CoV-2 by FDA under an Emergency Use Authorization (EUA). This EUA will remain  in effect (meaning this test can be used) for the duration of the COVID-19 declaration under Section 564(b)(1) of the Act, 21 U.S.C.section 360bbb-3(b)(1), unless the authorization is terminated  or revoked sooner.       Influenza A by PCR POSITIVE (A) NEGATIVE Final   Influenza B by PCR NEGATIVE NEGATIVE Final    Comment: (NOTE) The Xpert Xpress SARS-CoV-2/FLU/RSV plus assay is intended as an aid in the diagnosis of influenza from Nasopharyngeal swab specimens and should not be used as a sole basis for treatment. Nasal washings and aspirates are unacceptable for Xpert Xpress SARS-CoV-2/FLU/RSV testing.  Fact Sheet for Patients: BloggerCourse.com  Fact Sheet for Healthcare Providers: SeriousBroker.it  This test is not yet approved or cleared by the [place] FDA and has been authorized for detection and/or diagnosis of SARS-CoV-2 by FDA under an Emergency Use Authorization (EUA). This EUA will remain in effect (meaning this test can be used) for the duration of the COVID-19 declaration under Section 564(b)(1) of the Act, 21 U.S.C. section 360bbb-3(b)(1), unless  the authorization is  terminated or revoked.     Resp Syncytial Virus by PCR NEGATIVE NEGATIVE Final    Comment: (NOTE) Fact Sheet for Patients: BloggerCourse.com  Fact Sheet for Healthcare Providers: SeriousBroker.it  This test is not yet approved or cleared by the [place] FDA and has been authorized for detection and/or diagnosis of SARS-CoV-2 by FDA under an Emergency Use Authorization (EUA). This EUA will remain in effect (meaning this test can be used) for the duration of the COVID-19 declaration under Section 564(b)(1) of the Act, 21 U.S.C. section 360bbb-3(b)(1), unless the authorization is terminated or revoked.  Performed at [facility], [address] [place], [place] [address]   Blood Culture (routine x 2)     Status: None (Preliminary result)   Collection Time: 10/19/23  6:01 PM   Specimen: BLOOD LEFT HAND  Result Value Ref Range Status   Specimen Description   Final    BLOOD LEFT HAND Performed at [facility], [address] [place], [place] [address]    Special Requests   Final    BOTTLES DRAWN AEROBIC ONLY Blood Culture results may not be optimal due to an inadequate volume of blood received in culture bottles Performed at [facility], [address], [place], [place] [address]    Culture   Final    NO GROWTH 2 DAYS Performed at [facility], [address], [place], [place] [address]    Report Status PENDING  Incomplete     Time coordinating discharge: Over 30 minutes  SIGNED:   Willeen Niece, MD  [facility] 10/21/2023, 3:10 PM Pager   If 7PM-7AM, please contact night-coverage

## 2023-10-21 NOTE — TOC Transition Note (Signed)
Transition of Care [facility]) - Discharge Note   Patient Details  Name: Cindy [name] MRN: [id number] Date of Birth: 1932-02-01  Transition of Care [facility]) CM/SW Contact:  Larrie Kass, LCSW Phone Number: 10/21/2023, 11:06 AM   Clinical Narrative:     Patient to discharge home. CSW spoke with the patient and her son, Casimiro Needle, to discuss recommendations for home health services. The patient stated she would like to wait and follow up with her PCP. The patient denies any DME or transportation needs. TOC sign-off.     Final next level of care: Home/Self Care Barriers to Discharge: No Barriers Identified   Patient Goals and CMS Choice Patient states their goals for this hospitalization and ongoing recovery are:: retrun home and follow up with PCP CMS Medicare.gov Compare Post Acute Care list provided to:: Patient Choice offered to / list presented to : Patient, Adult Children      Discharge Placement                  Name of family member notified: Emilyn, [place] (Son)  [phone] [facility]) Patient and family notified of of transfer: 10/21/23  Discharge Plan and Services Additional resources added to the After Visit Summary for                                       Social Drivers of Health (SDOH) Interventions SDOH Screenings   Food Insecurity: No Food Insecurity (10/19/2023)  Housing: Low Risk  (10/19/2023)  Transportation Needs: No Transportation Needs (10/19/2023)  Utilities: Not At Risk (10/19/2023)  Social Connections: Moderately Integrated (10/21/2023)  Tobacco Use: Medium Risk (10/19/2023)     Readmission Risk Interventions    03/14/2023   10:54 AM  Readmission Risk Prevention Plan  Transportation Screening Complete  HRI or Home Care Consult Complete  Social Work Consult for Recovery Care Planning/Counseling Complete  Palliative Care Screening Not Applicable  Medication Review Ocean[facility]) Referral to Pharmacy

## 2023-10-21 NOTE — Discharge Instructions (Signed)
Advised to follow-up with primary care physician in 1 week. Advised to continue Tamiflu for 3 more days to complete 5-day course. Advised to take prednisone 40 mg daily for 3 more days for bronchospasm. Continue nebulized bronchodilators as needed at home.

## 2023-10-21 NOTE — Plan of Care (Signed)
  Problem: Clinical Measurements: Goal: Ability to maintain clinical measurements within normal limits will improve Outcome: Progressing Goal: Diagnostic test results will improve Outcome: Progressing Goal: Respiratory complications will improve Outcome: Progressing Goal: Cardiovascular complication will be avoided Outcome: Progressing   Problem: Coping: Goal: Level of anxiety will decrease Outcome: Progressing   Problem: Elimination: Goal: Will not experience complications related to bowel motility Outcome: Progressing Goal: Will not experience complications related to urinary retention Outcome: Progressing   Problem: Safety: Goal: Ability to remain free from injury will improve Outcome: Progressing   Problem: Clinical Measurements: Goal: Ability to maintain a body temperature in the normal range will improve Outcome: Progressing   Problem: Respiratory: Goal: Ability to maintain adequate ventilation will improve Outcome: Progressing Goal: Ability to maintain a clear airway will improve Outcome: Progressing

## 2023-10-22 DIAGNOSIS — R899 Unspecified abnormal finding in specimens from other organs, systems and tissues: Secondary | ICD-10-CM | POA: Diagnosis not present

## 2023-10-22 DIAGNOSIS — R531 Weakness: Secondary | ICD-10-CM | POA: Diagnosis not present

## 2023-10-22 DIAGNOSIS — Z09 Encounter for follow-up examination after completed treatment for conditions other than malignant neoplasm: Secondary | ICD-10-CM | POA: Diagnosis not present

## 2023-10-22 DIAGNOSIS — J111 Influenza due to unidentified influenza virus with other respiratory manifestations: Secondary | ICD-10-CM | POA: Diagnosis not present

## 2023-10-22 DIAGNOSIS — N390 Urinary tract infection, site not specified: Secondary | ICD-10-CM | POA: Diagnosis not present

## 2023-10-22 DIAGNOSIS — Z6827 Body mass index (BMI) 27.0-27.9, adult: Secondary | ICD-10-CM | POA: Diagnosis not present

## 2023-10-22 DIAGNOSIS — R410 Disorientation, unspecified: Secondary | ICD-10-CM | POA: Diagnosis not present

## 2023-10-22 DIAGNOSIS — J9601 Acute respiratory failure with hypoxia: Secondary | ICD-10-CM | POA: Diagnosis not present

## 2023-10-24 LAB — CULTURE, BLOOD (ROUTINE X 2)
Culture: NO GROWTH
Culture: NO GROWTH

## 2023-10-27 ENCOUNTER — Encounter (HOSPITAL_BASED_OUTPATIENT_CLINIC_OR_DEPARTMENT_OTHER): Payer: Self-pay

## 2023-10-27 ENCOUNTER — Inpatient Hospital Stay (HOSPITAL_BASED_OUTPATIENT_CLINIC_OR_DEPARTMENT_OTHER)
Admission: EM | Admit: 2023-10-27 | Discharge: 2023-10-31 | DRG: 194 | Disposition: A | Discharge status: Home Health | Payer: Medicare (Managed Care) | Attending: Internal Medicine | Admitting: Internal Medicine

## 2023-10-27 ENCOUNTER — Emergency Department (HOSPITAL_BASED_OUTPATIENT_CLINIC_OR_DEPARTMENT_OTHER): Payer: Medicare (Managed Care)

## 2023-10-27 ENCOUNTER — Other Ambulatory Visit: Payer: Self-pay

## 2023-10-27 DIAGNOSIS — E785 Hyperlipidemia, unspecified: Secondary | ICD-10-CM | POA: Diagnosis present

## 2023-10-27 DIAGNOSIS — I5033 Acute on chronic diastolic (congestive) heart failure: Secondary | ICD-10-CM | POA: Diagnosis present

## 2023-10-27 DIAGNOSIS — J849 Interstitial pulmonary disease, unspecified: Secondary | ICD-10-CM | POA: Diagnosis present

## 2023-10-27 DIAGNOSIS — E876 Hypokalemia: Secondary | ICD-10-CM | POA: Diagnosis present

## 2023-10-27 DIAGNOSIS — J440 Chronic obstructive pulmonary disease with acute lower respiratory infection: Secondary | ICD-10-CM | POA: Diagnosis present

## 2023-10-27 DIAGNOSIS — Z8249 Family history of ischemic heart disease and other diseases of the circulatory system: Secondary | ICD-10-CM

## 2023-10-27 DIAGNOSIS — E039 Hypothyroidism, unspecified: Secondary | ICD-10-CM | POA: Diagnosis present

## 2023-10-27 DIAGNOSIS — E7800 Pure hypercholesterolemia, unspecified: Secondary | ICD-10-CM | POA: Diagnosis present

## 2023-10-27 DIAGNOSIS — Z79890 Hormone replacement therapy (postmenopausal): Secondary | ICD-10-CM

## 2023-10-27 DIAGNOSIS — N179 Acute kidney failure, unspecified: Secondary | ICD-10-CM | POA: Diagnosis present

## 2023-10-27 DIAGNOSIS — I2510 Atherosclerotic heart disease of native coronary artery without angina pectoris: Secondary | ICD-10-CM | POA: Diagnosis present

## 2023-10-27 DIAGNOSIS — R54 Age-related physical debility: Secondary | ICD-10-CM | POA: Diagnosis present

## 2023-10-27 DIAGNOSIS — Z888 Allergy status to other drugs, medicaments and biological substances status: Secondary | ICD-10-CM

## 2023-10-27 DIAGNOSIS — I252 Old myocardial infarction: Secondary | ICD-10-CM | POA: Diagnosis not present

## 2023-10-27 DIAGNOSIS — R051 Acute cough: Secondary | ICD-10-CM

## 2023-10-27 DIAGNOSIS — J189 Pneumonia, unspecified organism: Secondary | ICD-10-CM | POA: Diagnosis present

## 2023-10-27 DIAGNOSIS — Z1152 Encounter for screening for COVID-19: Secondary | ICD-10-CM

## 2023-10-27 DIAGNOSIS — N184 Chronic kidney disease, stage 4 (severe): Secondary | ICD-10-CM | POA: Diagnosis present

## 2023-10-27 DIAGNOSIS — I130 Hypertensive heart and chronic kidney disease with heart failure and stage 1 through stage 4 chronic kidney disease, or unspecified chronic kidney disease: Secondary | ICD-10-CM | POA: Diagnosis present

## 2023-10-27 DIAGNOSIS — J1000 Influenza due to other identified influenza virus with unspecified type of pneumonia: Secondary | ICD-10-CM | POA: Diagnosis not present

## 2023-10-27 DIAGNOSIS — Z885 Allergy status to narcotic agent status: Secondary | ICD-10-CM

## 2023-10-27 DIAGNOSIS — I447 Left bundle-branch block, unspecified: Secondary | ICD-10-CM | POA: Diagnosis present

## 2023-10-27 DIAGNOSIS — Z955 Presence of coronary angioplasty implant and graft: Secondary | ICD-10-CM | POA: Diagnosis not present

## 2023-10-27 DIAGNOSIS — I5032 Chronic diastolic (congestive) heart failure: Secondary | ICD-10-CM | POA: Diagnosis present

## 2023-10-27 DIAGNOSIS — A419 Sepsis, unspecified organism: Secondary | ICD-10-CM

## 2023-10-27 DIAGNOSIS — Z79899 Other long term (current) drug therapy: Secondary | ICD-10-CM | POA: Diagnosis not present

## 2023-10-27 DIAGNOSIS — I10 Essential (primary) hypertension: Secondary | ICD-10-CM | POA: Diagnosis present

## 2023-10-27 DIAGNOSIS — Z90711 Acquired absence of uterus with remaining cervical stump: Secondary | ICD-10-CM

## 2023-10-27 DIAGNOSIS — N1832 Chronic kidney disease, stage 3b: Secondary | ICD-10-CM | POA: Diagnosis present

## 2023-10-27 DIAGNOSIS — Z87891 Personal history of nicotine dependence: Secondary | ICD-10-CM | POA: Diagnosis not present

## 2023-10-27 DIAGNOSIS — J449 Chronic obstructive pulmonary disease, unspecified: Secondary | ICD-10-CM | POA: Diagnosis present

## 2023-10-27 DIAGNOSIS — Z808 Family history of malignant neoplasm of other organs or systems: Secondary | ICD-10-CM

## 2023-10-27 DIAGNOSIS — Z951 Presence of aortocoronary bypass graft: Secondary | ICD-10-CM | POA: Diagnosis not present

## 2023-10-27 DIAGNOSIS — J13 Pneumonia due to Streptococcus pneumoniae: Secondary | ICD-10-CM | POA: Diagnosis not present

## 2023-10-27 DIAGNOSIS — Z85828 Personal history of other malignant neoplasm of skin: Secondary | ICD-10-CM

## 2023-10-27 DIAGNOSIS — R509 Fever, unspecified: Secondary | ICD-10-CM | POA: Diagnosis present

## 2023-10-27 DIAGNOSIS — Z7982 Long term (current) use of aspirin: Secondary | ICD-10-CM

## 2023-10-27 LAB — COMPREHENSIVE METABOLIC PANEL
ALT: 19 U/L (ref 0–44)
AST: 33 U/L (ref 15–41)
Albumin: 4.1 g/dL (ref 3.5–5.0)
Alkaline Phosphatase: 44 U/L (ref 38–126)
Anion gap: 9 (ref 5–15)
BUN: 41 mg/dL — ABNORMAL HIGH (ref 8–23)
CO2: 28 mmol/L (ref 22–32)
Calcium: 10 mg/dL (ref 8.9–10.3)
Chloride: 100 mmol/L (ref 98–111)
Creatinine, Ser: 1.66 mg/dL — ABNORMAL HIGH (ref 0.44–1.00)
GFR, Estimated: 29 mL/min — ABNORMAL LOW (ref >60)
Glucose, Bld: 132 mg/dL — ABNORMAL HIGH (ref 70–99)
Potassium: 4.2 mmol/L (ref 3.5–5.1)
Sodium: 137 mmol/L (ref 135–145)
Total Bilirubin: 0.9 mg/dL (ref 0.0–1.2)
Total Protein: 7.2 g/dL (ref 6.5–8.1)

## 2023-10-27 LAB — URINALYSIS, ROUTINE W REFLEX MICROSCOPIC
Bilirubin Urine: NEGATIVE
Glucose, UA: NEGATIVE mg/dL
Hgb urine dipstick: NEGATIVE
Ketones, ur: NEGATIVE mg/dL
Leukocytes,Ua: NEGATIVE
Nitrite: NEGATIVE
Protein, ur: NEGATIVE mg/dL
Specific Gravity, Urine: 1.014 (ref 1.005–1.030)
pH: 5.5 (ref 5.0–8.0)

## 2023-10-27 LAB — CBC WITH DIFFERENTIAL/PLATELET
Abs Immature Granulocytes: 0.39 10*3/uL — ABNORMAL HIGH (ref 0.00–0.07)
Basophils Absolute: 0 10*3/uL (ref 0.0–0.1)
Basophils Relative: 0 %
Eosinophils Absolute: 0.2 10*3/uL (ref 0.0–0.5)
Eosinophils Relative: 2 %
HCT: 35 % — ABNORMAL LOW (ref 36.0–46.0)
Hemoglobin: 12.1 g/dL (ref 12.0–15.0)
Immature Granulocytes: 4 %
Lymphocytes Relative: 14 %
Lymphs Abs: 1.4 10*3/uL (ref 0.7–4.0)
MCH: 33.7 pg (ref 26.0–34.0)
MCHC: 34.6 g/dL (ref 30.0–36.0)
MCV: 97.5 fL (ref 80.0–100.0)
Monocytes Absolute: 1.1 10*3/uL — ABNORMAL HIGH (ref 0.1–1.0)
Monocytes Relative: 11 %
Neutro Abs: 6.9 10*3/uL (ref 1.7–7.7)
Neutrophils Relative %: 69 %
Platelets: 164 10*3/uL (ref 150–400)
RBC: 3.59 MIL/uL — ABNORMAL LOW (ref 3.87–5.11)
RDW: 12.5 % (ref 11.5–15.5)
WBC: 10 10*3/uL (ref 4.0–10.5)
nRBC: 0 % (ref 0.0–0.2)

## 2023-10-27 LAB — RESP PANEL BY RT-PCR (RSV, FLU A&B, COVID)  RVPGX2
Influenza A by PCR: POSITIVE — AB
Influenza B by PCR: NEGATIVE
Resp Syncytial Virus by PCR: NEGATIVE
SARS Coronavirus 2 by RT PCR: NEGATIVE

## 2023-10-27 LAB — LIPASE, BLOOD: Lipase: 45 U/L (ref 11–51)

## 2023-10-27 LAB — PROTIME-INR
INR: 1 (ref 0.8–1.2)
Prothrombin Time: 13.2 s (ref 11.4–15.2)

## 2023-10-27 LAB — APTT: aPTT: 29 s (ref 24–36)

## 2023-10-27 MED ORDER — SODIUM CHLORIDE 0.9 % IV BOLUS (SEPSIS): 1000.0000 mL | Freq: Once | INTRAVENOUS | Status: DC

## 2023-10-27 MED ORDER — IPRATROPIUM-ALBUTEROL 0.5-2.5 (3) MG/3ML IN SOLN
3.0000 mL | Freq: Once | RESPIRATORY_TRACT | Status: AC
  Administered 2023-10-27: 3 mL via RESPIRATORY_TRACT
  Filled 2023-10-27: qty 3

## 2023-10-27 MED ORDER — ACETAMINOPHEN 325 MG PO TABS
650.0000 mg | ORAL_TABLET | Freq: Once | ORAL | Status: AC
  Administered 2023-10-27: 650 mg via ORAL
  Filled 2023-10-27: qty 2

## 2023-10-27 MED ORDER — VANCOMYCIN HCL IN DEXTROSE 1-5 GM/200ML-% IV SOLN
1000.0000 mg | Freq: Once | INTRAVENOUS | Status: AC
Start: 2023-10-27 — End: 2023-10-27
  Administered 2023-10-27: 1000 mg via INTRAVENOUS
  Filled 2023-10-27: qty 200

## 2023-10-27 MED ORDER — SODIUM CHLORIDE 0.9 % IV SOLN
2.0000 g | Freq: Once | INTRAVENOUS | Status: AC
  Administered 2023-10-27: 2 g via INTRAVENOUS
  Filled 2023-10-27: qty 12.5

## 2023-10-27 MED ORDER — SODIUM CHLORIDE 0.9 % IV BOLUS
1000.0000 mL | Freq: Once | INTRAVENOUS | Status: AC
  Administered 2023-10-27: 1000 mL via INTRAVENOUS

## 2023-10-27 MED ORDER — METHYLPREDNISOLONE SODIUM SUCC 125 MG IJ SOLR
125.0000 mg | Freq: Once | INTRAMUSCULAR | Status: AC
  Administered 2023-10-27: 125 mg via INTRAVENOUS
  Filled 2023-10-27: qty 2

## 2023-10-27 MED ORDER — ACETAMINOPHEN 650 MG RE SUPP
650.0000 mg | Freq: Once | RECTAL | Status: DC
Start: 2023-10-27 — End: 2023-10-27
  Filled 2023-10-27: qty 1

## 2023-10-27 NOTE — ED Provider Notes (Signed)
 [place] EMERGENCY DEPARTMENT AT [facility] Provider Note   CSN: [id number] Arrival date & time: 10/27/23  2028     History  Chief Complaint  Patient presents with   Altered Mental Status    Cindy [name] is a 89 y.o. female.  Patient here altered mental status.  Recently hospitalized for UTI and pneumonia.  She was doing better but now getting worse again today.  She is not able to take care of herself.  She was seen last week and had negative urine sample.  Denies any trauma or falls.  Mother makes it worse or better.  History of hypertension CAD COPD interstitial lung disease.  The history is provided by a caregiver.       Home Medications Prior to Admission medications   Medication Sig Start Date End Date Taking? Authorizing Provider  albuterol  (VENTOLIN  HFA) 108 (90 Base) MCG/ACT inhaler Inhale 1 puff into the lungs every 4 (four) hours as needed for wheezing or shortness of breath. 03/14/23   Raenelle Coria, MD  aspirin  81 MG tablet Take 81 mg by mouth daily.     [provider]  atorvastatin  (LIPITOR ) 80 MG tablet Take 1 tablet (80 mg total) by mouth daily. 12/25/18   Hammond, Janine, NP  benzonatate  (TESSALON ) 200 MG capsule Take 1 capsule by mouth twice daily as needed for cough Patient taking differently: Take 200 mg by mouth 2 (two) times daily. 09/23/20   Jude Harden GAILS, MD  Calcium  Carbonate-Vitamin D 600-200 MG-UNIT TABS Take 1 tablet by mouth 2 (two) times daily.    [provider]  ferrous sulfate  325 (65 FE) MG tablet Take 325 mg by mouth daily with breakfast.    [provider]  fexofenadine  (ALLEGRA ) 180 MG tablet Take 1 tablet (180 mg total) by mouth daily. 01/28/23   Jude Harden GAILS, MD  guaifenesin  (HUMIBID E) 400 MG TABS tablet Take 400 mg by mouth in the morning and at bedtime. 07/02/23   [provider]  ipratropium (ATROVENT ) 0.06 % nasal spray Place 2 sprays into both nostrils 3 (three) times daily as needed for  rhinitis.    [provider]  isosorbide  mononitrate (IMDUR ) 30 MG 24 hr tablet Take 90 mg by mouth daily. 11/13/21   [provider]  levothyroxine  (SYNTHROID , LEVOTHROID) 175 MCG tablet Take 175 mcg by mouth every evening.    [provider]  losartan  (COZAAR ) 50 MG tablet Take 50 mg by mouth daily.    [provider]  metoprolol  succinate (TOPROL -XL) 25 MG 24 hr tablet Take 1 tablet (25 mg total) by mouth daily. 02/14/23   Jeffrie Oneil BROCKS, MD  montelukast  (SINGULAIR ) 10 MG tablet Take 10 mg by mouth at bedtime. 08/31/20   [provider]  Multiple Vitamins-Minerals (ICAPS AREDS 2 PO) Take 1 capsule by mouth in the morning and at bedtime.    [provider]  Niacinamide-Zn-Cu-Methfo-Se-Cr (NICOTINAMIDE PO) Take 1 capsule by mouth 2 (two) times daily.    [provider]  nitroGLYCERIN  (NITROSTAT ) 0.4 MG SL tablet PLACE 1 TABLET UNDER THE TONGUE EVERY 5 MINUTES AS NEEDED FOR CHEST PAIN Patient taking differently: Place 0.4 mg under the tongue every 5 (five) minutes as needed for chest pain. 09/11/19   Jeffrie Oneil BROCKS, MD  pantoprazole  (PROTONIX ) 40 MG tablet Take 40 mg by mouth 2 (two) times daily.    [provider]  spironolactone -hydrochlorothiazide  (ALDACTAZIDE) 25-25 MG tablet Take 1 tablet by mouth daily.  [provider]  TART CHERRY PO Take 1,000 mg by mouth 2 (two) times a week.    [provider]  traMADol  (ULTRAM ) 50 MG tablet Take 50 mg by mouth 2 (two) times daily.    [provider]      Allergies    Codeine    Review of Systems   Review of Systems  Physical Exam Updated Vital Signs BP (!) 158/71 (BP Location: Left Arm)   Pulse 95   Temp (!) 102.7 F (39.3 C)   Resp 20   Ht 5' (1.524 m)   Wt 66 kg   LMP  (LMP Unknown)   SpO2 93%   BMI 28.42 kg/m  Physical Exam Vitals and nursing note reviewed.  Constitutional:      General: She is not in acute distress.    Appearance:  She is well-developed. She is not ill-appearing.  HENT:     Head: Normocephalic and atraumatic.     Nose: Nose normal.     Mouth/Throat:     Mouth: Mucous membranes are moist.  Eyes:     Extraocular Movements: Extraocular movements intact.     Conjunctiva/sclera: Conjunctivae normal.     Pupils: Pupils are equal, round, and reactive to light.  Cardiovascular:     Rate and Rhythm: Normal rate and regular rhythm.     Pulses: Normal pulses.     Heart sounds: No murmur heard. Pulmonary:     Effort: Pulmonary effort is normal. No respiratory distress.     Breath sounds: Normal breath sounds.  Abdominal:     Palpations: Abdomen is soft.     Tenderness: There is no abdominal tenderness.  Musculoskeletal:        General: No swelling.     Cervical back: Neck supple.  Skin:    General: Skin is warm and dry.     Capillary Refill: Capillary refill takes less than 2 seconds.     Findings: No rash.  Neurological:     General: No focal deficit present.     Mental Status: She is alert.  Psychiatric:        Mood and Affect: Mood normal.     ED Results / Procedures / Treatments   Labs (all labs ordered are listed, but only abnormal results are displayed) Labs Reviewed  URINALYSIS, ROUTINE W REFLEX MICROSCOPIC - Abnormal; Notable for the following components:      Result Value   Color, Urine COLORLESS (*)    All other components within normal limits  CBC WITH DIFFERENTIAL/PLATELET - Abnormal; Notable for the following components:   RBC 3.59 (*)    HCT 35.0 (*)    Monocytes Absolute 1.1 (*)    Abs Immature Granulocytes 0.39 (*)    All other components within normal limits  RESP PANEL BY RT-PCR (RSV, FLU A&B, COVID)  [id number]  CULTURE, BLOOD (ROUTINE X 2)  CULTURE, BLOOD (ROUTINE X 2)  URINE CULTURE  PROTIME-INR  APTT  COMPREHENSIVE METABOLIC PANEL  LIPASE, BLOOD  LACTIC ACID, PLASMA  LACTIC ACID, PLASMA    EKG EKG Interpretation Date/Time:  Sunday October 27 2023 21:14:02  EST Ventricular Rate:  88 PR Interval:  50 QRS Duration:  138 QT Interval:  365 QTC Calculation: 442 R Axis:   129  Text Interpretation: Sinus rhythm Short PR interval Left bundle branch block Confirmed by Ruthe Cornet [id number]) on 10/27/2023 9:36:50 PM  Radiology No results found.  Procedures .Critical Care  Performed by: Ruthe Cornet, DO  Authorized by: Ruthe Cornet, DO   Critical care provider statement:    Critical care time (minutes):  35   Critical care was necessary to treat or prevent imminent or life-threatening deterioration of the following conditions:  Sepsis   Critical care was time spent personally by me on the following activities:  Blood draw for specimens, development of treatment plan with patient or surrogate, discussions with primary provider, evaluation of patient's response to treatment, examination of patient, obtaining history from patient or surrogate, ordering and performing treatments and interventions, ordering and review of laboratory studies, ordering and review of radiographic studies, pulse oximetry, re-evaluation of patient's condition and review of old charts   I assumed direction of critical care for this patient from another provider in my specialty: no       Medications Ordered in ED Medications  sodium chloride  0.9 % bolus 1,000 mL (has no administration in time range)  vancomycin  (VANCOCIN ) IVPB 1000 mg/200 mL premix (1,000 mg Intravenous New Bag/Given 10/27/23 2108)  ceFEPIme  (MAXIPIME ) 2 g in sodium chloride  0.9 % 100 mL IVPB (2 g Intravenous New Bag/Given 10/27/23 2112)  ipratropium-albuterol  (DUONEB) 0.5-2.5 (3) MG/3ML nebulizer solution 3 mL (has no administration in time range)  methylPREDNISolone  sodium succinate  (SOLU-MEDROL ) 125 mg/2 mL injection 125 mg (has no administration in time range)  acetaminophen  (TYLENOL ) tablet 650 mg (650 mg Oral Given 10/27/23 2117)    ED Course/ Medical Decision Making/ A&P                                  Medical Decision Making Amount and/or Complexity of Data Reviewed Labs: ordered. Radiology: ordered.  Risk OTC drugs. Prescription drug management. Decision regarding hospitalization.   [name] is here with altered mental status.  Patient arrives febrile.  She was just hospitalized for UTI influenza.  She has fever heart rate greater than 90.  She meets sepsis protocol.  Sepsis workup initiated.  Will start broad-spectrum IV antibiotics.  Overall we will get CBC CMP lipase urinalysis chest x-ray COVID and flu testing.  Will give IV fluids and check a lactic acid.  Will give rectal Tylenol .  Anticipate admission as patient's not been able to perform her ADLs.  She was just hospitalized Back to her baseline but now decompensated again.  Sounds like she had a urine checked last week which was unremarkable.  Does not seem like she has had any falls.  Overall per my review and interpretation of labs she has no significant leukocytosis.  Urinalysis looks negative for infection.  Chest x-ray may be with pneumonia but overall I think this is likely of viral process with a chronic aspiration component.  Sounds like she does have chronic aspiration.  Given fever and very harsh cough on exam I have treated her with IV antibiotics IV steroids and breathing treatment.  She has been 92% on room air has mildly increased work of breathing.  She has no abdominal tenderness.  No rash.  Have no concern for other source for infection at this time.  She just had influenza recently could be a postviral pneumonia process.  Will admit to the hospitalist for further care.  Mentation is improving following Tylenol  and IV fluids.  Patient to be admitted for further sepsis care.  This chart was dictated using voice recognition software.  Despite best efforts to proofread,  errors can occur which can change the documentation meaning.  Final Clinical Impression(s) / ED Diagnoses Final diagnoses:   Pneumonia due to infectious organism, unspecified laterality, unspecified part of lung  Acute cough  Sepsis, due to unspecified organism, unspecified whether acute organ dysfunction present [facility])    Rx / DC Orders ED Discharge Orders     None         Ruthe Cornet, DO 10/27/23 2142

## 2023-10-27 NOTE — ED Notes (Addendum)
-  Patient Son would like a phone call when patient is assigned a bed, stated does not matter what time of the night it is, he will have his phone beside him. Advised son that pt will most likely not get a bed until after 7am. -If patient is still here at 7, please call son to advise him of that.

## 2023-10-27 NOTE — ED Triage Notes (Addendum)
 Pt was recently hospitalized for UTI & pneumonia Son states she is back to where she was before hospitalization.  Confusion, lethargic and not able to perform ADLs. Appt on Friday showed her urine was clean

## 2023-10-27 NOTE — Progress Notes (Signed)
 Plan of Care Note for accepted transfer   Patient: Cindy [name] MRN: [id number]   DOA: 10/27/2023  Facility requesting transfer: [facility].  ER. Requesting Provider: Dr. Dorthey. Reason for transfer: Possible postviral pneumonia. Facility course: 89 year old female with history of chronic kidney disease stage IV, iron-deficiency anemia, interstitial lung disease, COPD, hypertension, hyperlipidemia and chronic diastolic heart failure recently admitted and discharged on October 21, 2023 for influenza A infection was treated with Tamiflu  presents back with fever chills confusion.  ER physician feels that patient has possible viral pneumonia with lung exam concerning for pneumonia.  Started on empiric antibiotics admitted for further observation.  Appears nonfocal as per the ER physician.  Presently saturating well on room air.  Plan of care: The patient is accepted for admission to Telemetry unit, at [facility]..   Author: Redia LOISE Cleaver, MD 10/27/2023  Check [email or web address] for on-call coverage.  Nursing staff, Please call [facility] Admits & Consults System-Wide number on Amion as soon as patient's arrival, so appropriate admitting provider can evaluate the pt.

## 2023-10-27 NOTE — Progress Notes (Signed)
 Pt being followed by [facility] for Sepsis protocol.

## 2023-10-27 NOTE — ED Notes (Signed)
 Pt is not able to ambulate or help much with pivoting.  Urine obtained, pt moved to rm 16

## 2023-10-28 DIAGNOSIS — Z888 Allergy status to other drugs, medicaments and biological substances status: Secondary | ICD-10-CM | POA: Diagnosis not present

## 2023-10-28 DIAGNOSIS — I130 Hypertensive heart and chronic kidney disease with heart failure and stage 1 through stage 4 chronic kidney disease, or unspecified chronic kidney disease: Secondary | ICD-10-CM | POA: Diagnosis not present

## 2023-10-28 DIAGNOSIS — Z79890 Hormone replacement therapy (postmenopausal): Secondary | ICD-10-CM | POA: Diagnosis not present

## 2023-10-28 DIAGNOSIS — E876 Hypokalemia: Secondary | ICD-10-CM | POA: Diagnosis not present

## 2023-10-28 DIAGNOSIS — N184 Chronic kidney disease, stage 4 (severe): Secondary | ICD-10-CM | POA: Diagnosis not present

## 2023-10-28 DIAGNOSIS — Z885 Allergy status to narcotic agent status: Secondary | ICD-10-CM | POA: Diagnosis not present

## 2023-10-28 DIAGNOSIS — R54 Age-related physical debility: Secondary | ICD-10-CM | POA: Diagnosis not present

## 2023-10-28 DIAGNOSIS — Z87891 Personal history of nicotine dependence: Secondary | ICD-10-CM | POA: Diagnosis not present

## 2023-10-28 DIAGNOSIS — I5032 Chronic diastolic (congestive) heart failure: Secondary | ICD-10-CM | POA: Diagnosis not present

## 2023-10-28 DIAGNOSIS — E7800 Pure hypercholesterolemia, unspecified: Secondary | ICD-10-CM | POA: Diagnosis not present

## 2023-10-28 DIAGNOSIS — I252 Old myocardial infarction: Secondary | ICD-10-CM | POA: Diagnosis not present

## 2023-10-28 DIAGNOSIS — Z951 Presence of aortocoronary bypass graft: Secondary | ICD-10-CM | POA: Diagnosis not present

## 2023-10-28 DIAGNOSIS — J449 Chronic obstructive pulmonary disease, unspecified: Secondary | ICD-10-CM | POA: Diagnosis not present

## 2023-10-28 DIAGNOSIS — J189 Pneumonia, unspecified organism: Secondary | ICD-10-CM | POA: Diagnosis not present

## 2023-10-28 DIAGNOSIS — I2510 Atherosclerotic heart disease of native coronary artery without angina pectoris: Secondary | ICD-10-CM | POA: Diagnosis not present

## 2023-10-28 DIAGNOSIS — E039 Hypothyroidism, unspecified: Secondary | ICD-10-CM | POA: Diagnosis not present

## 2023-10-28 DIAGNOSIS — N179 Acute kidney failure, unspecified: Secondary | ICD-10-CM | POA: Diagnosis not present

## 2023-10-28 DIAGNOSIS — J440 Chronic obstructive pulmonary disease with acute lower respiratory infection: Secondary | ICD-10-CM | POA: Diagnosis not present

## 2023-10-28 DIAGNOSIS — Z79899 Other long term (current) drug therapy: Secondary | ICD-10-CM | POA: Diagnosis not present

## 2023-10-28 DIAGNOSIS — Z955 Presence of coronary angioplasty implant and graft: Secondary | ICD-10-CM | POA: Diagnosis not present

## 2023-10-28 DIAGNOSIS — Z1152 Encounter for screening for COVID-19: Secondary | ICD-10-CM | POA: Diagnosis not present

## 2023-10-28 LAB — RESPIRATORY PANEL BY PCR

## 2023-10-28 LAB — STREP PNEUMONIAE URINARY ANTIGEN: Strep Pneumo Urinary Antigen: NEGATIVE

## 2023-10-28 LAB — MRSA NEXT GEN BY PCR, NASAL: MRSA by PCR Next Gen: NOT DETECTED

## 2023-10-28 MED ORDER — TRAMADOL HCL 50 MG PO TABS
50.0000 mg | ORAL_TABLET | Freq: Two times a day (BID) | ORAL | Status: DC
Start: 2023-10-28 — End: 2023-10-31
  Administered 2023-10-28 – 2023-10-31 (×6): 50 mg via ORAL
  Filled 2023-10-28 (×6): qty 1

## 2023-10-28 MED ORDER — LOSARTAN POTASSIUM 50 MG PO TABS
50.0000 mg | ORAL_TABLET | Freq: Every day | ORAL | Status: DC
  Administered 2023-10-28: 50 mg via ORAL
  Filled 2023-10-28: qty 1

## 2023-10-28 MED ORDER — ALBUTEROL SULFATE (2.5 MG/3ML) 0.083% IN NEBU: 2.5000 mg | INHALATION_SOLUTION | RESPIRATORY_TRACT | Status: DC | PRN

## 2023-10-28 MED ORDER — LEVOTHYROXINE SODIUM 25 MCG PO TABS
175.0000 ug | ORAL_TABLET | Freq: Every evening | ORAL | Status: DC
  Administered 2023-10-28 – 2023-10-30 (×3): 175 ug via ORAL
  Filled 2023-10-28 (×4): qty 1

## 2023-10-28 MED ORDER — NITROGLYCERIN 0.4 MG SL SUBL: 0.4000 mg | SUBLINGUAL_TABLET | SUBLINGUAL | Status: DC | PRN

## 2023-10-28 MED ORDER — ENOXAPARIN SODIUM 30 MG/0.3ML IJ SOSY
30.0000 mg | PREFILLED_SYRINGE | INTRAMUSCULAR | Status: DC
  Administered 2023-10-28 – 2023-10-30 (×3): 30 mg via SUBCUTANEOUS
  Filled 2023-10-28 (×3): qty 0.3

## 2023-10-28 MED ORDER — MONTELUKAST SODIUM 10 MG PO TABS
10.0000 mg | ORAL_TABLET | Freq: Every day | ORAL | Status: DC
  Administered 2023-10-28 – 2023-10-31 (×4): 10 mg via ORAL
  Filled 2023-10-28 (×4): qty 1

## 2023-10-28 MED ORDER — ONDANSETRON HCL 4 MG PO TABS: 4.0000 mg | ORAL_TABLET | Freq: Four times a day (QID) | ORAL | Status: DC | PRN

## 2023-10-28 MED ORDER — PREDNISONE 20 MG PO TABS
40.0000 mg | ORAL_TABLET | Freq: Every day | ORAL | Status: DC
  Administered 2023-10-28 – 2023-10-29 (×2): 40 mg via ORAL
  Filled 2023-10-28 (×2): qty 2

## 2023-10-28 MED ORDER — GUAIFENESIN 200 MG PO TABS
400.0000 mg | ORAL_TABLET | Freq: Two times a day (BID) | ORAL | Status: DC
  Administered 2023-10-28 – 2023-10-31 (×6): 400 mg via ORAL
  Filled 2023-10-28 (×6): qty 2

## 2023-10-28 MED ORDER — ACETAMINOPHEN 650 MG RE SUPP: 650.0000 mg | Freq: Four times a day (QID) | RECTAL | Status: DC | PRN

## 2023-10-28 MED ORDER — LINEZOLID 600 MG/300ML IV SOLN
600.0000 mg | Freq: Two times a day (BID) | INTRAVENOUS | Status: DC
  Administered 2023-10-28 – 2023-10-29 (×2): 600 mg via INTRAVENOUS
  Filled 2023-10-28 (×2): qty 300

## 2023-10-28 MED ORDER — PANTOPRAZOLE SODIUM 40 MG PO TBEC
40.0000 mg | DELAYED_RELEASE_TABLET | Freq: Two times a day (BID) | ORAL | Status: DC
  Administered 2023-10-28 – 2023-10-31 (×6): 40 mg via ORAL
  Filled 2023-10-28 (×6): qty 1

## 2023-10-28 MED ORDER — LINEZOLID 600 MG/300ML IV SOLN
600.0000 mg | Freq: Two times a day (BID) | INTRAVENOUS | Status: DC
  Filled 2023-10-28: qty 300

## 2023-10-28 MED ORDER — BENZONATATE 100 MG PO CAPS: 200.0000 mg | ORAL_CAPSULE | Freq: Two times a day (BID) | ORAL | Status: DC | PRN

## 2023-10-28 MED ORDER — HYDROCHLOROTHIAZIDE 25 MG PO TABS
25.0000 mg | ORAL_TABLET | Freq: Every day | ORAL | Status: DC
  Administered 2023-10-28: 25 mg via ORAL
  Filled 2023-10-28: qty 1

## 2023-10-28 MED ORDER — METOPROLOL SUCCINATE ER 25 MG PO TB24
25.0000 mg | ORAL_TABLET | Freq: Every day | ORAL | Status: DC
  Administered 2023-10-28 – 2023-10-31 (×4): 25 mg via ORAL
  Filled 2023-10-28 (×4): qty 1

## 2023-10-28 MED ORDER — SODIUM CHLORIDE 0.9 % IV SOLN
2.0000 g | Freq: Every day | INTRAVENOUS | Status: DC
  Administered 2023-10-28: 2 g via INTRAVENOUS
  Filled 2023-10-28: qty 12.5

## 2023-10-28 MED ORDER — ASPIRIN 81 MG PO TBEC
81.0000 mg | DELAYED_RELEASE_TABLET | Freq: Every day | ORAL | Status: DC
  Administered 2023-10-28 – 2023-10-31 (×4): 81 mg via ORAL
  Filled 2023-10-28 (×4): qty 1

## 2023-10-28 MED ORDER — SPIRONOLACTONE 25 MG PO TABS
25.0000 mg | ORAL_TABLET | Freq: Every day | ORAL | Status: DC
  Administered 2023-10-28: 25 mg via ORAL
  Filled 2023-10-28: qty 1

## 2023-10-28 MED ORDER — ACETAMINOPHEN 325 MG PO TABS
650.0000 mg | ORAL_TABLET | Freq: Four times a day (QID) | ORAL | Status: DC | PRN
  Administered 2023-10-28: 650 mg via ORAL
  Filled 2023-10-28: qty 2

## 2023-10-28 MED ORDER — ONDANSETRON HCL 4 MG/2ML IJ SOLN: 4.0000 mg | Freq: Four times a day (QID) | INTRAMUSCULAR | Status: DC | PRN

## 2023-10-28 MED ORDER — ATORVASTATIN CALCIUM 40 MG PO TABS
80.0000 mg | ORAL_TABLET | Freq: Every day | ORAL | Status: DC
  Administered 2023-10-28 – 2023-10-30 (×3): 80 mg via ORAL
  Filled 2023-10-28 (×3): qty 2

## 2023-10-28 MED ORDER — SPIRONOLACTONE-HCTZ 25-25 MG PO TABS: 1.0000 | ORAL_TABLET | Freq: Every day | ORAL | Status: DC

## 2023-10-28 MED ORDER — ISOSORBIDE MONONITRATE ER 30 MG PO TB24
90.0000 mg | ORAL_TABLET | Freq: Every day | ORAL | Status: DC
  Administered 2023-10-28 – 2023-10-31 (×4): 90 mg via ORAL
  Filled 2023-10-28 (×4): qty 1

## 2023-10-28 MED ORDER — SODIUM CHLORIDE 0.45 % IV SOLN: INTRAVENOUS | Status: DC

## 2023-10-28 NOTE — ED Notes (Signed)
 Assisted patient to bedside commode. BM x1. 350 UOP noted.

## 2023-10-28 NOTE — ED Notes (Signed)
 Pt was giving half a cup of drinking water, ok by RN Chanin.

## 2023-10-28 NOTE — ED Notes (Signed)
 Report given to [facility]. ETE 15 minutes.

## 2023-10-28 NOTE — ED Notes (Signed)
 Attempted to call report to [facility] [place]. Nurse to return call.

## 2023-10-28 NOTE — ED Notes (Signed)
 C.Maynard,RN and this writer in to change patient's bedding after finding that the tubing for the pure wick was hooked up incorrectly and patient had voided. Complete bedding change performed, patient cleansed, gown changed. Patient more alert and oriented now than when she first arrived.

## 2023-10-28 NOTE — ED Notes (Signed)
 Called patient's son and updated him about transport being 15 minutes out from our location.

## 2023-10-28 NOTE — Plan of Care (Signed)

## 2023-10-28 NOTE — ED Notes (Signed)
 Updated patient's son on the admission room.

## 2023-10-28 NOTE — ED Notes (Signed)
 Report given to Dahlia Client, RN at [facility] Rm [id number].

## 2023-10-28 NOTE — H&P (Addendum)
 History and Physical    Patient: Cindy [name] DOB: May 04, 1932 DOA: 10/27/2023 DOS: the patient was seen and examined on 10/28/2023 PCP: Okey Carlin Redbird, MD  Patient coming from: Home  Chief Complaint:  Chief Complaint  Patient presents with   Altered Mental Status   HPI: Cindy [name] is a 89 y.o. female with medical history significant of asthma, bronchitis, COPD, ILD, CAD, history of MI, GERD, hyperlipidemia, hypertension, renal insufficiency, hypothyroidism who was recently admitted and discharged due to influenza A who return to the emergency department again with dyspnea and fever.  Also having chills, rhinorrhea, wheezing, but no sore throat or hemoptysis.  She stated initially felt well for several days after discharge, but has had generalized weakness, fatigue, dyspnea and decreased oral intake for the last 2 days.  Mild pleuritic CP, no palpitations, diaphoresis, PND, orthopnea or pitting edema of the lower extremities.  No abdominal pain, nausea, emesis, diarrhea, constipation, melena or hematochezia.  No flank pain, dysuria, frequency or hematuria.  No polyuria, polydipsia, polyphagia or blurred vision.   Lab work: Urine analysis was colorless and otherwise unremarkable.  CBC is her white count of 10.0 with 69% neutrophils, 14% lymphocytes and 11% monocytes.  Hemoglobin was 12.1 g/dL and platelets 835.  Normal PT, INR and PTT.  Normal lipase.  Influenza A PCR remains positive, negative coronavirus, influenza B and RSV PCR.  CMP showed a glucose of 132, BUN of 41 and creatinine 1.66 mg/dL.  The rest of the CMP measurements were normal.  Imaging: Portable 1 view chest radiograph showing a stable cardiomediastinal silhouette.  Aortic atherosclerosis.  Diffuse interstitial coarsening with left basilar atelectasis or infiltrates.  No definite pleural effusion.  No pneumothorax.   ED course: Initial vital signs were temperature 102.7 F, pulse 95, respiration 20, BP 158/71  mmHg O2 sat 93% on room air.  The patient received acetaminophen  650 mg p.o. x 1, cefepime  2 g IVP, DuoNeb, methylprednisolone  125 mg IVP, 1000 mL normal saline bolus and 1000 mg of vancomycin  IVPB.  Review of Systems: As mentioned in the history of present illness. All other systems reviewed and are negative. Past Medical History:  Diagnosis Date   Asthma    Bronchitis    COPD (chronic obstructive pulmonary disease) (HCC)    Coronary artery disease    MI age 79   GERD (gastroesophageal reflux disease)    Heart attack (HCC)    Hypercholesteremia    Hypertension    ILD (interstitial lung disease) (HCC)    Iron deficiency anemia    used to see hem in [place], [place].    Renal insufficiency    Thyroid  disease    Past Surgical History:  Procedure Laterality Date   APPENDECTOMY     BREAST BIOPSY Right    benign   CORONARY ANGIOPLASTY WITH STENT PLACEMENT     CORONARY ARTERY BYPASS GRAFT  09/1979   LEFT HEART CATH AND CORS/GRAFTS ANGIOGRAPHY N/A 11/27/2018   Procedure: LEFT HEART CATH AND CORS/GRAFTS ANGIOGRAPHY;  Surgeon: Court Dorn PARAS, MD;  Location: [facility];  Service: Cardiovascular;  Laterality: N/A;   Moh's Surgeries     for squamous cell carcinoma   PARTIAL HYSTERECTOMY  10/1978   Social History:  reports that she quit smoking about 44 years ago. Her smoking use included cigarettes. She started smoking about 66 years ago. She has a 33 pack-year smoking history. She has never used smokeless tobacco. She reports current alcohol  use. She reports that  she does not use drugs.  Allergies  Allergen Reactions   Codeine Other (See Comments)    Headache from heavy doses of codeine only    Family History  Problem Relation Age of Onset   Heart disease Mother    Heart disease Father    Melanoma Sister        x2   Breast cancer Neg Hx     Prior to Admission medications   Medication Sig Start Date End Date Taking? Authorizing Provider  albuterol  (VENTOLIN  HFA) 108 (90 Base)  MCG/ACT inhaler Inhale 1 puff into the lungs every 4 (four) hours as needed for wheezing or shortness of breath. 03/14/23   Raenelle Coria, MD  aspirin  81 MG tablet Take 81 mg by mouth daily.     [provider]  atorvastatin  (LIPITOR ) 80 MG tablet Take 1 tablet (80 mg total) by mouth daily. 12/25/18   Hammond, Janine, NP  benzonatate  (TESSALON ) 200 MG capsule Take 1 capsule by mouth twice daily as needed for cough Patient taking differently: Take 200 mg by mouth 2 (two) times daily. 09/23/20   Jude Harden GAILS, MD  Calcium  Carbonate-Vitamin D 600-200 MG-UNIT TABS Take 1 tablet by mouth 2 (two) times daily.    [provider]  ferrous sulfate  325 (65 FE) MG tablet Take 325 mg by mouth daily with breakfast.    [provider]  fexofenadine  (ALLEGRA ) 180 MG tablet Take 1 tablet (180 mg total) by mouth daily. 01/28/23   Jude Harden GAILS, MD  guaifenesin  (HUMIBID E) 400 MG TABS tablet Take 400 mg by mouth in the morning and at bedtime. 07/02/23   [provider]  ipratropium (ATROVENT ) 0.06 % nasal spray Place 2 sprays into both nostrils 3 (three) times daily as needed for rhinitis.    [provider]  isosorbide  mononitrate (IMDUR ) 30 MG 24 hr tablet Take 90 mg by mouth daily. 11/13/21   [provider]  levothyroxine  (SYNTHROID , LEVOTHROID) 175 MCG tablet Take 175 mcg by mouth every evening.    [provider]  losartan  (COZAAR ) 50 MG tablet Take 50 mg by mouth daily.    [provider]  metoprolol  succinate (TOPROL -XL) 25 MG 24 hr tablet Take 1 tablet (25 mg total) by mouth daily. 02/14/23   Jeffrie Oneil BROCKS, MD  montelukast  (SINGULAIR ) 10 MG tablet Take 10 mg by mouth at bedtime. 08/31/20   [provider]  Multiple Vitamins-Minerals (ICAPS AREDS 2 PO) Take 1 capsule by mouth in the morning and at bedtime.    [provider]  Niacinamide-Zn-Cu-Methfo-Se-Cr (NICOTINAMIDE PO) Take 1 capsule by mouth 2 (two) times daily.     [provider]  nitroGLYCERIN  (NITROSTAT ) 0.4 MG SL tablet PLACE 1 TABLET UNDER THE TONGUE EVERY 5 MINUTES AS NEEDED FOR CHEST PAIN Patient taking differently: Place 0.4 mg under the tongue every 5 (five) minutes as needed for chest pain. 09/11/19   Jeffrie Oneil BROCKS, MD  pantoprazole  (PROTONIX ) 40 MG tablet Take 40 mg by mouth 2 (two) times daily.    [provider]  spironolactone -hydrochlorothiazide  (ALDACTAZIDE) 25-25 MG tablet Take 1 tablet by mouth daily.    [provider]  TART CHERRY PO Take 1,000 mg by mouth 2 (two) times a week.    [provider]  traMADol  (ULTRAM ) 50 MG tablet Take 50 mg by mouth 2 (two) times daily.    [provider]    Physical Exam: Vitals:   10/28/23 0700 10/28/23 0740 10/28/23 9170  10/28/23 1025  BP: (!) 172/83 (!) 149/105 (!) 187/68 (!) 159/113  Pulse: 74 93 82 78  Resp: 20 15 15 17   Temp:   97.8 F (36.6 C) 98 F (36.7 C)  TempSrc:   Oral   SpO2: 95% 100% 97% 98%  Weight:      Height:       Physical Exam Vitals and nursing note reviewed.  Constitutional:      General: She is awake. She is not in acute distress.    Appearance: Normal appearance. She is ill-appearing.     Interventions: Nasal cannula in place.  HENT:     Head: Normocephalic.     Nose: No rhinorrhea.     Mouth/Throat:     Mouth: Mucous membranes are moist.  Eyes:     General: No scleral icterus.    Pupils: Pupils are equal, round, and reactive to light.  Neck:     Vascular: No JVD.  Cardiovascular:     Rate and Rhythm: Normal rate and regular rhythm.     Heart sounds: S1 normal and S2 normal.  Pulmonary:     Effort: No tachypnea or accessory muscle usage.     Breath sounds: Wheezing, rhonchi and rales present.  Abdominal:     General: Bowel sounds are normal.     Palpations: Abdomen is soft.     Tenderness: There is no abdominal tenderness.  Musculoskeletal:     Cervical back: Neck supple.     Right lower leg: No edema.      Left lower leg: No edema.  Skin:    General: Skin is warm and dry.  Neurological:     General: No focal deficit present.     Mental Status: She is alert.  Psychiatric:        Mood and Affect: Mood normal.        Behavior: Behavior normal. Behavior is cooperative.    Data Reviewed:  Results are pending, will review when available. 03/12/2023 echocardiogram report IMPRESSIONS:   1. Left ventricular ejection fraction, by estimation, is 55 to 60%. The  left ventricle has normal function. The left ventricle demonstrates  regional wall motion abnormalities (see scoring diagram/findings for  description). Left ventricular diastolic  parameters are consistent with Grade I diastolic dysfunction (impaired  relaxation).   2. Right ventricular systolic function is normal. The right ventricular  size is normal. There is moderately elevated pulmonary artery systolic  pressure. The estimated right ventricular systolic pressure is 45.0 mmHg.   3. Left atrial size was moderately dilated.   4. The mitral valve is normal in structure. Mild to moderate mitral valve  regurgitation.   5. The aortic valve is tricuspid. There is mild calcification of the  aortic valve. Aortic valve regurgitation is not visualized. Aortic valve  sclerosis is present, with no evidence of aortic valve stenosis.   6. The inferior vena cava is dilated in size with <50% respiratory  variability, suggesting right atrial pressure of 15 mmHg.   EKG: Vent. rate 88 BPM PR interval 50 ms QRS duration 138 ms QT/QTcB 365/442 ms P-R-T axes 0 129 -24 Sinus rhythm Short PR interval Left bundle branch block  Assessment and Plan: Principal Problem:   Fever   Pneumonia Superimposed on:   Interstitial lung disease (HCC) Admit to telemetry/inpatient. Continue supplemental oxygen. Scheduled and as needed bronchodilators. Continue cefepime  1 g IVPB every 12 hours. Vancomycin  per pharmacy. Check strep pneumoniae urinary  antigen. Check sputum Gram stain, culture  and sensitivity. Follow-up blood culture and sensitivity. Follow-up CBC and chemistry in the morning.  Active Problems:   Acute renal failure superimposed on stage 3b chronic kidney disease (HCC)  Gentle IV fluids. Hold ARB/ACE. Hold diuretic. Avoid hypotension. Avoid nephrotoxins. Monitor intake and output. Monitor renal function/electrolytes.    Chronic diastolic CHF (congestive heart failure) (HCC) Holding diuretics and ARB. Continue Imdur  90 mg p.o. daily in the morning. SheContinue metoprolol  succinate 25 mg p.o. daily.    Essential hypertension Continue antihypertensives as above.    Coronary atherosclerosis of native coronary artery Continue ASA, beta-blocker and statin.    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    Chronic kidney disease, stage 3b (HCC) Monitor hematocrit hemoglobin.     HLD (hyperlipidemia) Will hold atorvastatin  given body aches.     Acquired hypothyroidism Continue levothyroxine  175 mcg p.o. daily.    Advance Care Planning:   Code Status: Full Code   Consults:   Family Communication: Her son and husband were at bedside.  Severity of Illness: The appropriate patient status for this patient is OBSERVATION. Observation status is judged to be reasonable and necessary in order to provide the required intensity of service to ensure the patient's safety. The patient's presenting symptoms, physical exam findings, and initial radiographic and laboratory data in the context of their medical condition is felt to place them at decreased risk for further clinical deterioration. Furthermore, it is anticipated that the patient will be medically stable for discharge from the hospital within 2 midnights of admission.   Author: Alm Dorn Castor, MD 10/28/2023 10:31 AM  For on call review [email or web address].   This document was prepared using Dragon voice recognition software and may contain some  unintended transcription errors.

## 2023-10-28 NOTE — Progress Notes (Signed)
 PHARMACY NOTE:  ANTIMICROBIAL RENAL DOSAGE ADJUSTMENT  Current antimicrobial regimen includes a mismatch between antimicrobial dosage and estimated renal function. As per policy approved by the Pharmacy & Therapeutics and Medical Executive Committees, the antimicrobial dosage will be adjusted accordingly.  Current antimicrobial and dosage:  Cefepime  1 gm q12 hr  Indication: PNA  Renal Function:   Estimated Creatinine Clearance: 19.5 mL/min (A) (by C-G formula based on SCr of 1.66 mg/dL (H)). []      On intermittent HD, scheduled: []      On CRRT    Antimicrobial dosage has been changed to:  2g IV q24 hr   Additional Comments: standard CAP/HAP dosing for CrCl < 30 ml/min   Thank you for allowing pharmacy to be a part of this patient's care.  Bard Jeans, PharmD, BCPS [phone] 10/28/2023, 6:11 PM

## 2023-10-29 DIAGNOSIS — J13 Pneumonia due to Streptococcus pneumoniae: Secondary | ICD-10-CM | POA: Diagnosis not present

## 2023-10-29 DIAGNOSIS — E7800 Pure hypercholesterolemia, unspecified: Secondary | ICD-10-CM | POA: Diagnosis present

## 2023-10-29 DIAGNOSIS — I5032 Chronic diastolic (congestive) heart failure: Secondary | ICD-10-CM | POA: Diagnosis present

## 2023-10-29 DIAGNOSIS — Z79899 Other long term (current) drug therapy: Secondary | ICD-10-CM | POA: Diagnosis not present

## 2023-10-29 DIAGNOSIS — N184 Chronic kidney disease, stage 4 (severe): Secondary | ICD-10-CM | POA: Diagnosis present

## 2023-10-29 DIAGNOSIS — R54 Age-related physical debility: Secondary | ICD-10-CM | POA: Diagnosis present

## 2023-10-29 DIAGNOSIS — Z1152 Encounter for screening for COVID-19: Secondary | ICD-10-CM | POA: Diagnosis not present

## 2023-10-29 DIAGNOSIS — I130 Hypertensive heart and chronic kidney disease with heart failure and stage 1 through stage 4 chronic kidney disease, or unspecified chronic kidney disease: Secondary | ICD-10-CM | POA: Diagnosis present

## 2023-10-29 DIAGNOSIS — Z951 Presence of aortocoronary bypass graft: Secondary | ICD-10-CM | POA: Diagnosis not present

## 2023-10-29 DIAGNOSIS — J189 Pneumonia, unspecified organism: Secondary | ICD-10-CM | POA: Diagnosis present

## 2023-10-29 DIAGNOSIS — Z885 Allergy status to narcotic agent status: Secondary | ICD-10-CM | POA: Diagnosis not present

## 2023-10-29 DIAGNOSIS — J440 Chronic obstructive pulmonary disease with acute lower respiratory infection: Secondary | ICD-10-CM | POA: Diagnosis present

## 2023-10-29 DIAGNOSIS — J449 Chronic obstructive pulmonary disease, unspecified: Secondary | ICD-10-CM | POA: Diagnosis present

## 2023-10-29 DIAGNOSIS — Z87891 Personal history of nicotine dependence: Secondary | ICD-10-CM | POA: Diagnosis not present

## 2023-10-29 DIAGNOSIS — J1000 Influenza due to other identified influenza virus with unspecified type of pneumonia: Secondary | ICD-10-CM | POA: Diagnosis not present

## 2023-10-29 DIAGNOSIS — E876 Hypokalemia: Secondary | ICD-10-CM | POA: Diagnosis present

## 2023-10-29 DIAGNOSIS — Z79890 Hormone replacement therapy (postmenopausal): Secondary | ICD-10-CM | POA: Diagnosis not present

## 2023-10-29 DIAGNOSIS — E039 Hypothyroidism, unspecified: Secondary | ICD-10-CM | POA: Diagnosis present

## 2023-10-29 DIAGNOSIS — I252 Old myocardial infarction: Secondary | ICD-10-CM | POA: Diagnosis not present

## 2023-10-29 DIAGNOSIS — N179 Acute kidney failure, unspecified: Secondary | ICD-10-CM | POA: Diagnosis present

## 2023-10-29 DIAGNOSIS — Z888 Allergy status to other drugs, medicaments and biological substances status: Secondary | ICD-10-CM | POA: Diagnosis not present

## 2023-10-29 DIAGNOSIS — Z955 Presence of coronary angioplasty implant and graft: Secondary | ICD-10-CM | POA: Diagnosis not present

## 2023-10-29 DIAGNOSIS — I2510 Atherosclerotic heart disease of native coronary artery without angina pectoris: Secondary | ICD-10-CM | POA: Diagnosis present

## 2023-10-29 LAB — URINE CULTURE: Culture: <10000 — AB

## 2023-10-29 LAB — CBC
HCT: 29.5 % — ABNORMAL LOW (ref 36.0–46.0)
Hemoglobin: 10.3 g/dL — ABNORMAL LOW (ref 12.0–15.0)
MCH: 34.3 pg — ABNORMAL HIGH (ref 26.0–34.0)
MCHC: 34.9 g/dL (ref 30.0–36.0)
MCV: 98.3 fL (ref 80.0–100.0)
Platelets: 148 10*3/uL — ABNORMAL LOW (ref 150–400)
RBC: 3 MIL/uL — ABNORMAL LOW (ref 3.87–5.11)
RDW: 12.4 % (ref 11.5–15.5)
WBC: 14.7 10*3/uL — ABNORMAL HIGH (ref 4.0–10.5)
nRBC: 0 % (ref 0.0–0.2)

## 2023-10-29 LAB — BRAIN NATRIURETIC PEPTIDE: B Natriuretic Peptide: 291.5 pg/mL — ABNORMAL HIGH (ref 0.0–100.0)

## 2023-10-29 LAB — COMPREHENSIVE METABOLIC PANEL
ALT: 15 U/L (ref 0–44)
AST: 19 U/L (ref 15–41)
Albumin: 2.8 g/dL — ABNORMAL LOW (ref 3.5–5.0)
Alkaline Phosphatase: 32 U/L — ABNORMAL LOW (ref 38–126)
Anion gap: 10 (ref 5–15)
BUN: 35 mg/dL — ABNORMAL HIGH (ref 8–23)
CO2: 21 mmol/L — ABNORMAL LOW (ref 22–32)
Calcium: 8.3 mg/dL — ABNORMAL LOW (ref 8.9–10.3)
Chloride: 103 mmol/L (ref 98–111)
Creatinine, Ser: 1.15 mg/dL — ABNORMAL HIGH (ref 0.44–1.00)
GFR, Estimated: 46 mL/min — ABNORMAL LOW (ref >60)
Glucose, Bld: 143 mg/dL — ABNORMAL HIGH (ref 70–99)
Potassium: 3.6 mmol/L (ref 3.5–5.1)
Sodium: 134 mmol/L — ABNORMAL LOW (ref 135–145)
Total Bilirubin: 0.9 mg/dL (ref 0.0–1.2)
Total Protein: 5.1 g/dL — ABNORMAL LOW (ref 6.5–8.1)

## 2023-10-29 LAB — PROCALCITONIN: Procalcitonin: <0.1 ng/mL

## 2023-10-29 MED ORDER — CYCLOBENZAPRINE HCL 5 MG PO TABS
5.0000 mg | ORAL_TABLET | Freq: Three times a day (TID) | ORAL | Status: DC | PRN
  Administered 2023-10-29: 5 mg via ORAL
  Filled 2023-10-29: qty 1

## 2023-10-29 MED ORDER — METOPROLOL TARTRATE 5 MG/5ML IV SOLN: 5.0000 mg | INTRAVENOUS | Status: DC | PRN

## 2023-10-29 MED ORDER — HYDRALAZINE HCL 20 MG/ML IJ SOLN: 10.0000 mg | INTRAMUSCULAR | Status: DC | PRN

## 2023-10-29 MED ORDER — ALBUTEROL SULFATE (2.5 MG/3ML) 0.083% IN NEBU
2.5000 mg | INHALATION_SOLUTION | RESPIRATORY_TRACT | Status: DC | PRN
  Administered 2023-10-29: 2.5 mg via RESPIRATORY_TRACT
  Filled 2023-10-29: qty 3

## 2023-10-29 MED ORDER — DOXYCYCLINE HYCLATE 100 MG PO TABS
100.0000 mg | ORAL_TABLET | Freq: Two times a day (BID) | ORAL | Status: DC
  Administered 2023-10-29 – 2023-10-31 (×5): 100 mg via ORAL
  Filled 2023-10-29 (×5): qty 1

## 2023-10-29 MED ORDER — IPRATROPIUM-ALBUTEROL 0.5-2.5 (3) MG/3ML IN SOLN: 3.0000 mL | RESPIRATORY_TRACT | Status: DC | PRN

## 2023-10-29 MED ORDER — FERROUS SULFATE 325 (65 FE) MG PO TABS
325.0000 mg | ORAL_TABLET | Freq: Every day | ORAL | Status: DC
  Administered 2023-10-30 – 2023-10-31 (×2): 325 mg via ORAL
  Filled 2023-10-29 (×2): qty 1

## 2023-10-29 MED ORDER — TRAZODONE HCL 50 MG PO TABS
50.0000 mg | ORAL_TABLET | Freq: Every evening | ORAL | Status: DC | PRN
  Administered 2023-10-29: 50 mg via ORAL
  Filled 2023-10-29: qty 1

## 2023-10-29 MED ORDER — PREDNISONE 20 MG PO TABS
20.0000 mg | ORAL_TABLET | Freq: Every day | ORAL | Status: DC
Start: 2023-10-30 — End: 2023-11-02
  Administered 2023-10-30 – 2023-10-31 (×2): 20 mg via ORAL
  Filled 2023-10-29 (×2): qty 1

## 2023-10-29 MED ORDER — REVEFENACIN 175 MCG/3ML IN SOLN
175.0000 ug | Freq: Every day | RESPIRATORY_TRACT | Status: DC
  Administered 2023-10-29 – 2023-10-31 (×3): 175 ug via RESPIRATORY_TRACT
  Filled 2023-10-29 (×3): qty 3

## 2023-10-29 MED ORDER — SENNOSIDES-DOCUSATE SODIUM 8.6-50 MG PO TABS: 1.0000 | ORAL_TABLET | Freq: Every evening | ORAL | Status: DC | PRN

## 2023-10-29 MED ORDER — ARFORMOTEROL TARTRATE 15 MCG/2ML IN NEBU
15.0000 ug | INHALATION_SOLUTION | Freq: Two times a day (BID) | RESPIRATORY_TRACT | Status: DC
  Administered 2023-10-29 – 2023-10-31 (×5): 15 ug via RESPIRATORY_TRACT
  Filled 2023-10-29 (×5): qty 2

## 2023-10-29 NOTE — Progress Notes (Signed)
 PROGRESS NOTE    Cindy [name]  FMW:[id number] DOB: [birth year] DOA: 10/27/2023 PCP: Okey Carlin Redbird, MD    Brief Narrative:   89 year old female with history of asthma, bronchitis, COPD, ILD, CAD, GERD, HLD, HTN, renal insufficiency, hypothyroidism recently discharged from the hospital on 10/21/2023.  After being treated for influenza A.  Patient started having dyspnea and fever at home with poor oral intake during this time.  Upon admission noted to have likely post viral bacterial pneumonia therefore started on antibiotics, bronchodilators.  Assessment & Plan:  Principal Problem:   Pneumonia Active Problems:   Interstitial lung disease (HCC)   Coronary atherosclerosis of native coronary artery   History of tobacco use   HLD (hyperlipidemia)   Acquired hypothyroidism   Essential hypertension   Acute renal failure superimposed on stage 3b chronic kidney disease (HCC)   Fever   Chronic diastolic CHF (congestive heart failure) (HCC)   Left lower lobe pneumonia, likely postviral Recent influenza A infection History of interstitial lung disease Continue broad-spectrum antibiotic, slowly de-escalate.  Bronchodilators, I-S/flutter valve. Procalcitonin is negative.  BNP minimally elevated but improved compared to previous levels Urine strep pneumo antigen-negative    Acute renal failure superimposed on stage 3b chronic kidney disease (HCC)  Baseline creatinine 1.2, admission creatinine 1.66.  Improved with IV fluids.  Slowly resume home medications     Chronic diastolic CHF (congestive heart failure) (HCC) Holding diuretics and ARB. Continue Imdur  90 mg p.o. daily in the morning. SheContinue metoprolol  succinate 25 mg p.o. daily.     Essential hypertension Continue antihypertensives as above.  IV as needed     Coronary atherosclerosis of native coronary artery Continue ASA, beta-blocker and statin.     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    Chronic  kidney disease, stage 3b (HCC) Monitor hematocrit hemoglobin.     HLD (hyperlipidemia) Will hold atorvastatin  given body aches.     Acquired hypothyroidism Continue levothyroxine  175 mcg p.o. daily.   DVT prophylaxis: enoxaparin  (LOVENOX ) injection 30 mg Start: 10/28/23 2200     Code Status: Full Code Family Communication:   Continue hospital stay for evaluation and management for her weakness  Subjective:  Seen and examined at bedside, feels slightly better this morning.  Son and husband are also present at bedside.  Over the last 3 days after going home patient has become weaker per family.  They also report that she chronically coughs and is undergoing outpatient evaluation for the past 2 years.  Examination:  General exam: Appears calm and comfortable, elderly frail Respiratory system: Very minimal rhonchi at the bases Cardiovascular system: S1 & S2 heard, RRR. No JVD, murmurs, rubs, gallops or clicks. No pedal edema. Gastrointestinal system: Abdomen is nondistended, soft and nontender. No organomegaly or masses felt. Normal bowel sounds heard. Central nervous system: Alert and oriented. No focal neurological deficits. Extremities: Symmetric 5 x 5 power. Skin: No rashes, lesions or ulcers Psychiatry: Judgement and insight appear normal. Mood & affect appropriate.                Diet Orders (From admission, onward)     Start     Ordered   10/29/23 1020  Diet regular Room service appropriate? Yes; Fluid consistency: Thin  Diet effective now       Question Answer Comment  Room service appropriate? Yes   Fluid consistency: Thin      10/29/23 1019  Objective: Vitals:   10/28/23 1801 10/28/23 2214 10/29/23 0456 10/29/23 0810  BP: (!) 116/45 (!) 141/50 (!) 149/47   Pulse: 78 73 61   Resp: 17 18 18    Temp: 98.4 F (36.9 C) 97.9 F (36.6 C) (!) 97.4 F (36.3 C)   TempSrc:      SpO2: 96% 96% 97% 95%  Weight:      Height:         Intake/Output Summary (Last 24 hours) at 10/29/2023 1211 Last data filed at 10/29/2023 0538 Gross per 24 hour  Intake 480 ml  Output 1600 ml  Net -1120 ml   Filed Weights   10/27/23 2036  Weight: 66 kg    Scheduled Meds:  arformoterol   15 mcg Nebulization BID   aspirin  EC  81 mg Oral Daily   atorvastatin   80 mg Oral q1800   doxycycline   100 mg Oral Q12H   enoxaparin  (LOVENOX ) injection  30 mg Subcutaneous Q24H   guaiFENesin   400 mg Oral BID   isosorbide  mononitrate  90 mg Oral Daily   levothyroxine   175 mcg Oral QPM   metoprolol  succinate  25 mg Oral Daily   montelukast   10 mg Oral Daily   pantoprazole   40 mg Oral BID   [START ON 10/30/2023] predniSONE   20 mg Oral Q breakfast   revefenacin   175 mcg Nebulization Daily   traMADol   50 mg Oral BID   Continuous Infusions:  Nutritional status     Body mass index is 28.42 kg/m.  Data Reviewed:   CBC: Recent Labs  Lab 10/27/23 2051 10/29/23 0504  WBC 10.0 14.7*  NEUTROABS 6.9  --   HGB 12.1 10.3*  HCT 35.0* 29.5*  MCV 97.5 98.3  PLT 164 148*   Basic Metabolic Panel: Recent Labs  Lab 10/27/23 2051 10/29/23 0504  NA 137 134*  K 4.2 3.6  CL 100 103  CO2 28 21*  GLUCOSE 132* 143*  BUN 41* 35*  CREATININE 1.66* 1.15*  CALCIUM  10.0 8.3*   GFR: Estimated Creatinine Clearance: 28.1 mL/min (A) (by C-G formula based on SCr of 1.15 mg/dL (H)). Liver Function Tests: Recent Labs  Lab 10/27/23 2051 10/29/23 0504  AST 33 19  ALT 19 15  ALKPHOS 44 32*  BILITOT 0.9 0.9  PROT 7.2 5.1*  ALBUMIN  4.1 2.8*   Recent Labs  Lab 10/27/23 2051  LIPASE 45   No results for input(s): AMMONIA in the last 168 hours. Coagulation Profile: Recent Labs  Lab 10/27/23 2051  INR 1.0   Cardiac Enzymes: No results for input(s): CKTOTAL, CKMB, CKMBINDEX, TROPONINI in the last 168 hours. BNP (last 3 results) No results for input(s): PROBNP in the last 8760 hours. HbA1C: No results for input(s): HGBA1C in the  last 72 hours. CBG: No results for input(s): GLUCAP in the last 168 hours. Lipid Profile: No results for input(s): CHOL, HDL, LDLCALC, TRIG, CHOLHDL, LDLDIRECT in the last 72 hours. Thyroid  Function Tests: No results for input(s): TSH, T4TOTAL, FREET4, T3FREE, THYROIDAB in the last 72 hours. Anemia Panel: No results for input(s): VITAMINB12, FOLATE, FERRITIN, TIBC, IRON, RETICCTPCT in the last 72 hours. Sepsis Labs: Recent Labs  Lab 10/29/23 0504  PROCALCITON <0.10    Recent Results (from the past 240 hours)  Resp panel by RT-PCR (RSV, Flu A&B, Covid) Anterior Nasal Swab     Status: Abnormal   Collection Time: 10/19/23  1:19 PM   Specimen: Anterior Nasal Swab  Result Value Ref Range Status  SARS Coronavirus 2 by RT PCR NEGATIVE NEGATIVE Final    Comment: (NOTE) SARS-CoV-2 target nucleic acids are NOT DETECTED.  The SARS-CoV-2 RNA is generally detectable in upper respiratory specimens during the acute phase of infection. The lowest concentration of SARS-CoV-2 viral copies this assay can detect is 138 copies/mL. A negative result does not preclude SARS-Cov-2 infection and should not be used as the sole basis for treatment or other patient management decisions. A negative result may occur with  improper specimen collection/handling, submission of specimen other than nasopharyngeal swab, presence of viral mutation(s) within the areas targeted by this assay, and inadequate number of viral copies(<138 copies/mL). A negative result must be combined with clinical observations, patient history, and epidemiological information. The expected result is Negative.  Fact Sheet for Patients:  bloggercourse.com  Fact Sheet for Healthcare Providers:  seriousbroker.it  This test is no t yet approved or cleared by the [place]  FDA and  has been authorized for detection and/or diagnosis of SARS-CoV-2  by FDA under an Emergency Use Authorization (EUA). This EUA will remain  in effect (meaning this test can be used) for the duration of the COVID-19 declaration under Section 564(b)(1) of the Act, 21 U.S.C.section 360bbb-3(b)(1), unless the authorization is terminated  or revoked sooner.       Influenza A by PCR POSITIVE (A) NEGATIVE Final   Influenza B by PCR NEGATIVE NEGATIVE Final    Comment: (NOTE) The Xpert Xpress SARS-CoV-2/FLU/RSV plus assay is intended as an aid in the diagnosis of influenza from Nasopharyngeal swab specimens and should not be used as a sole basis for treatment. Nasal washings and aspirates are unacceptable for Xpert Xpress SARS-CoV-2/FLU/RSV testing.  Fact Sheet for Patients: bloggercourse.com  Fact Sheet for Healthcare Providers: seriousbroker.it  This test is not yet approved or cleared by the [place]  FDA and has been authorized for detection and/or diagnosis of SARS-CoV-2 by FDA under an Emergency Use Authorization (EUA). This EUA will remain in effect (meaning this test can be used) for the duration of the COVID-19 declaration under Section 564(b)(1) of the Act, 21 U.S.C. section 360bbb-3(b)(1), unless the authorization is terminated or revoked.     Resp Syncytial Virus by PCR NEGATIVE NEGATIVE Final    Comment: (NOTE) Fact Sheet for Patients: bloggercourse.com  Fact Sheet for Healthcare Providers: seriousbroker.it  This test is not yet approved or cleared by the [place]  FDA and has been authorized for detection and/or diagnosis of SARS-CoV-2 by FDA under an Emergency Use Authorization (EUA). This EUA will remain in effect (meaning this test can be used) for the duration of the COVID-19 declaration under Section 564(b)(1) of the Act, 21 U.S.C. section 360bbb-3(b)(1), unless the authorization is terminated or revoked.  Performed at  [facility], [address], [place], [place] [address]   Blood Culture (routine x 2)     Status: None   Collection Time: 10/19/23  6:01 PM   Specimen: BLOOD LEFT HAND  Result Value Ref Range Status   Specimen Description   Final    BLOOD LEFT HAND Performed at [facility], [address] [place], [place] [address]    Special Requests   Final    BOTTLES DRAWN AEROBIC ONLY Blood Culture results may not be optimal due to an inadequate volume of blood received in culture bottles Performed at [facility], [address], [place], [place] [address]    Culture   Final    NO GROWTH 5 DAYS Performed  at [facility], [address], [place], [place] [address]    Report Status 10/24/2023 FINAL  Final  Resp panel by RT-PCR (RSV, Flu A&B, Covid) Anterior Nasal Swab     Status: Abnormal   Collection Time: 10/27/23  8:49 PM   Specimen: Anterior Nasal Swab  Result Value Ref Range Status   SARS Coronavirus 2 by RT PCR NEGATIVE NEGATIVE Final    Comment: (NOTE) SARS-CoV-2 target nucleic acids are NOT DETECTED.  The SARS-CoV-2 RNA is generally detectable in upper respiratory specimens during the acute phase of infection. The lowest concentration of SARS-CoV-2 viral copies this assay can detect is 138 copies/mL. A negative result does not preclude SARS-Cov-2 infection and should not be used as the sole basis for treatment or other patient management decisions. A negative result may occur with  improper specimen collection/handling, submission of specimen other than nasopharyngeal swab, presence of viral mutation(s) within the areas targeted by this assay, and inadequate number of viral copies(<138 copies/mL). A negative result must be combined with clinical observations, patient history, and epidemiological information. The expected result is Negative.  Fact Sheet for Patients:  bloggercourse.com  Fact Sheet for  Healthcare Providers:  seriousbroker.it  This test is no t yet approved or cleared by the [place]  FDA and  has been authorized for detection and/or diagnosis of SARS-CoV-2 by FDA under an Emergency Use Authorization (EUA). This EUA will remain  in effect (meaning this test can be used) for the duration of the COVID-19 declaration under Section 564(b)(1) of the Act, 21 U.S.C.section 360bbb-3(b)(1), unless the authorization is terminated  or revoked sooner.       Influenza A by PCR POSITIVE (A) NEGATIVE Final   Influenza B by PCR NEGATIVE NEGATIVE Final    Comment: (NOTE) The Xpert Xpress SARS-CoV-2/FLU/RSV plus assay is intended as an aid in the diagnosis of influenza from Nasopharyngeal swab specimens and should not be used as a sole basis for treatment. Nasal washings and aspirates are unacceptable for Xpert Xpress SARS-CoV-2/FLU/RSV testing.  Fact Sheet for Patients: bloggercourse.com  Fact Sheet for Healthcare Providers: seriousbroker.it  This test is not yet approved or cleared by the [place]  FDA and has been authorized for detection and/or diagnosis of SARS-CoV-2 by FDA under an Emergency Use Authorization (EUA). This EUA will remain in effect (meaning this test can be used) for the duration of the COVID-19 declaration under Section 564(b)(1) of the Act, 21 U.S.C. section 360bbb-3(b)(1), unless the authorization is terminated or revoked.     Resp Syncytial Virus by PCR NEGATIVE NEGATIVE Final    Comment: (NOTE) Fact Sheet for Patients: bloggercourse.com  Fact Sheet for Healthcare Providers: seriousbroker.it  This test is not yet approved or cleared by the [place]  FDA and has been authorized for detection and/or diagnosis of SARS-CoV-2 by FDA under an Emergency Use Authorization (EUA). This EUA will remain in effect (meaning  this test can be used) for the duration of the COVID-19 declaration under Section 564(b)(1) of the Act, 21 U.S.C. section 360bbb-3(b)(1), unless the authorization is terminated or revoked.  Performed at [facility], [address], [place], [place] [address]   Urine Culture     Status: Abnormal   Collection Time: 10/27/23  8:49 PM   Specimen: Urine, Clean Catch  Result Value Ref Range Status   Specimen Description   Final    URINE, CLEAN CATCH Performed at [facility], [address], [place], [place] [address]    Special  Requests   Final    NONE Performed at [facility], [address] [place], [place] [address]    Culture (A)  Final    <10,000 COLONIES/mL INSIGNIFICANT GROWTH Performed at [facility], [address] [place], [place] [address]    Report Status 10/29/2023 FINAL  Final  Blood culture (routine x 2)     Status: None (Preliminary result)   Collection Time: 10/27/23  8:51 PM   Specimen: BLOOD  Result Value Ref Range Status   Specimen Description   Final    BLOOD LEFT ANTECUBITAL Performed at [facility], [address] [place], [place] [address]    Special Requests   Final    BOTTLES DRAWN AEROBIC ONLY Performed at [facility], [address] [place], [place] [address]    Culture   Final    NO GROWTH 1 DAY Performed at [facility], [address] [place], [place] [address]    Report Status PENDING  Incomplete  Blood culture (routine x 2)     Status: None (Preliminary result)   Collection Time: 10/28/23 11:54 AM   Specimen: BLOOD LEFT HAND  Result Value Ref Range Status   Specimen Description   Final    BLOOD LEFT HAND Performed at [facility], [address] [place], [place] [address]    Special Requests   Final    BOTTLES DRAWN AEROBIC ONLY Blood Culture results may not be optimal due to an inadequate volume of blood received in  culture bottles   Culture   Final    NO GROWTH < 24 HOURS Performed at [facility], [address], [place], [place] [address]    Report Status PENDING  Incomplete  Respiratory (~20 pathogens) panel by PCR     Status: Abnormal   Collection Time: 10/28/23 12:52 PM   Specimen: Nasopharyngeal Swab; Respiratory  Result Value Ref Range Status   Adenovirus NOT DETECTED NOT DETECTED Final   Coronavirus 229E NOT DETECTED NOT DETECTED Final    Comment: (NOTE) The Coronavirus on the Respiratory Panel, DOES NOT test for the novel  Coronavirus (2019 nCoV)    Coronavirus HKU1 NOT DETECTED NOT DETECTED Final   Coronavirus NL63 NOT DETECTED NOT DETECTED Final   Coronavirus OC43 NOT DETECTED NOT DETECTED Final   Metapneumovirus NOT DETECTED NOT DETECTED Final   Rhinovirus / Enterovirus NOT DETECTED NOT DETECTED Final   Influenza A H3 DETECTED (A) NOT DETECTED Final   Influenza B NOT DETECTED NOT DETECTED Final   Parainfluenza Virus 1 NOT DETECTED NOT DETECTED Final   Parainfluenza Virus 2 NOT DETECTED NOT DETECTED Final   Parainfluenza Virus 3 NOT DETECTED NOT DETECTED Final   Parainfluenza Virus 4 NOT DETECTED NOT DETECTED Final   Respiratory Syncytial Virus NOT DETECTED NOT DETECTED Final   Bordetella pertussis NOT DETECTED NOT DETECTED Final   Bordetella Parapertussis NOT DETECTED NOT DETECTED Final   Chlamydophila pneumoniae NOT DETECTED NOT DETECTED Final   Mycoplasma pneumoniae NOT DETECTED NOT DETECTED Final    Comment: Performed at [facility], [address] [place], [place] [address]  MRSA Next Gen by PCR, Nasal     Status: None   Collection Time: 10/28/23  9:09 PM   Specimen: Nasal Mucosa; Nasal Swab  Result Value Ref Range Status   MRSA by PCR Next Gen NOT DETECTED NOT DETECTED Final    Comment: (NOTE) The GeneXpert MRSA Assay (  FDA approved for NASAL specimens only), is one component of a comprehensive MRSA colonization surveillance program. It is not intended to  diagnose MRSA infection nor to guide or monitor treatment for MRSA infections. Test performance is not FDA approved in patients less than 46 years old. Performed at [facility], [address], [place], [place] [address]          Radiology Studies: DG Chest Portable 1 View Result Date: 10/27/2023 CLINICAL DATA:  Altered mental status. Recently hospitalized for UTI pneumonia. EXAM: PORTABLE CHEST 1 VIEW COMPARISON:  10/19/2023 FINDINGS: Stable cardiomediastinal silhouette. Sternotomy. Fractured inferior sternal wire. Aortic atherosclerotic calcification. Diffuse interstitial coarsening. Left basilar atelectasis or infiltrates. No definite pleural effusion. No pneumothorax. IMPRESSION: Interstitial coarsening may be chronic or due to edema or infection. Left basilar atelectasis or infiltrates. Electronically Signed   By: Norman Gatlin M.D.   On: 10/27/2023 21:44           LOS: 0 days   Time spent= 35 mins    Burgess JAYSON Dare, MD [facility]  If 7PM-7AM, please contact night-coverage  10/29/2023, 12:11 PM

## 2023-10-29 NOTE — Evaluation (Signed)
 Occupational Therapy Evaluation Patient Details Name: Cindy [name] MRN: [id number] DOB: 06-14-1932 Today's Date: 10/29/2023   History of Present Illness 89 year old female with history of asthma, bronchitis, COPD, ILD, CAD, GERD, HLD, HTN, renal insufficiency, hypothyroidism recently discharged from the hospital on 10/21/2023. Patient started having dyspnea and fever at home with poor oral intake during this time.  Upon admission noted to have likely post viral bacterial pneumonia therefore started on antibiotics, bronchodilators.   Clinical Impression   Pt admitted with the above diagnosis. Pt currently with functional limitations due to the deficits listed below (see OT Problem List). Prior to admit, pt was living at home with her husband, independent/Mod I for ADL tasks and functional mobility. Recommend follow up Home health OT services to focus on mentioned deficits below. Pt will benefit from acute skilled OT to increase their safety and independence with ADL and functional mobility for ADL to facilitate discharge. OT will continue to follow patient acutely.         If plan is discharge home, recommend the following: A little help with walking and/or transfers;A little help with bathing/dressing/bathroom;Assist for transportation;Assistance with cooking/housework    Functional Status Assessment  Patient has had a recent decline in their functional status and demonstrates the ability to make significant improvements in function in a reasonable and predictable amount of time.  Equipment Recommendations  None recommended by OT    Recommendations for Other Services PT consult     Precautions / Restrictions Precautions Precautions: Fall Restrictions Weight Bearing Restrictions Per Provider Order: No      Mobility Bed Mobility Overal bed mobility: Needs Assistance Bed Mobility: Supine to Sit     Supine to sit: Supervision, HOB elevated, Used rails     General bed mobility  comments: Increased time to complete task.    Transfers Overall transfer level: Needs assistance Equipment used: Rolling walker (2 wheels), None Transfers: Sit to/from Stand, Bed to chair/wheelchair/BSC Sit to Stand: Contact guard assist     Step pivot transfers: Contact guard assist     General transfer comment: No AD used during sit to stand transition. RW provided for pt walk from bed to door then performed a U turn to walk and sit in recliner.      Balance Overall balance assessment: Needs assistance Sitting-balance support: No upper extremity supported, Feet supported Sitting balance-Leahy Scale: Fair Sitting balance - Comments: sitting EOB Postural control: Posterior lean Standing balance support: No upper extremity supported Standing balance-Leahy Scale: Poor Standing balance comment: Upon initial standing, pt presented with a posterior sway and was provided min A to correct. CGA provided for balance with use of RW for safety.          ADL either performed or assessed with clinical judgement   ADL  General ADL Comments: Pt required assistance to don hospital socks although this is her baseline per pt report. Husband assists with sock management at home. CGA-Min A provided for BADL tasks.     Vision Baseline Vision/History: 1 Wears glasses Ability to See in Adequate Light: 0 Adequate Patient Visual Report: No change from baseline Vision Assessment?: No apparent visual deficits Additional Comments: Wears her glasses not all the time.     Perception Perception: Not tested       Praxis Praxis: Not tested       Pertinent Vitals/Pain Pain Assessment Pain Assessment: No/denies pain     Extremity/Trunk Assessment Upper Extremity Assessment Upper Extremity Assessment: Right hand dominant;RUE deficits/detail;LUE deficits/detail RUE Deficits /  Details: History of RTC tear with baseline limitations. Functional strength noted during evaluation LUE Deficits /  Details: A/ROM WFL. Functional strength noted during evaluation by observation. Pt deconditioned and demonstrated decreased endurance.   Lower Extremity Assessment Lower Extremity Assessment: Defer to PT evaluation   Cervical / Trunk Assessment Cervical / Trunk Assessment: Normal   Communication Communication Communication: No apparent difficulties   Cognition Arousal: Alert Behavior During Therapy: WFL for tasks assessed/performed Overall Cognitive Status: Within Functional Limits for tasks assessed    General Comments  VSS on RA. Purewick removed prior to getting out of bed. Nursing made aware.            Home Living Family/patient expects to be discharged to:: Private residence Living Arrangements: Spouse/significant other   Type of Home: Other(Comment) (Condo) Home Access: Level entry (just needs to cross over the threshold)     Home Layout: One level     Bathroom Shower/Tub: Walk-in shower;Door   [employer] Toilet: Standard Bathroom Accessibility: Yes   Home Equipment: Educational Psychologist (2 wheels);Rollator (4 wheels);BSC/3in1;Shower seat - built in;Grab bars - tub/shower          Prior Functioning/Environment Prior Level of Function : Independent/Modified Independent;Driving      Mobility Comments: Uses Rollator sometimes. Will typically just wall walk if necessary. ADLs Comments: independent        OT Problem List: Decreased activity tolerance;Impaired balance (sitting and/or standing)      OT Treatment/Interventions: Self-care/ADL training;Therapeutic activities;Therapeutic exercise;Patient/family education;Energy conservation;DME and/or AE instruction;Balance training    OT Goals(Current goals can be found in the care plan section) Acute Rehab OT Goals Patient Stated Goal: to go home OT Goal Formulation: With patient Time For Goal Achievement: 11/12/23 Potential to Achieve Goals: Good  OT Frequency: Min 1X/week       AM-PAC OT 6  Clicks Daily Activity     Outcome Measure Help from another person eating meals?: None Help from another person taking care of personal grooming?: A Little Help from another person toileting, which includes using toliet, bedpan, or urinal?: A Little Help from another person bathing (including washing, rinsing, drying)?: A Little Help from another person to put on and taking off regular upper body clothing?: A Little Help from another person to put on and taking off regular lower body clothing?: A Little 6 Click Score: 19   End of Session Equipment Utilized During Treatment: Rolling walker (2 wheels) Nurse Communication: Mobility status  Activity Tolerance: Patient tolerated treatment well Patient left: in chair;with call bell/phone within reach;with chair alarm set;with family/visitor present  OT Visit Diagnosis: Unsteadiness on feet (R26.81);Muscle weakness (generalized) (M62.81)                Time: 8546-8476 OT Time Calculation (min): 30 min Charges:  OT General Charges $OT Visit: 1 Visit OT Evaluation $OT Eval Moderate Complexity: 1 Mod OT Treatments $Therapeutic Activity: 8-22 mins  Leita Howell, OTR/L,CBIS  Supplemental OT - [facility] and [facility] Secure Chat Preferred    Duward Allbritton, Leita BIRCH 10/29/2023, 4:18 PM

## 2023-10-29 NOTE — Hospital Course (Addendum)
 Brief Narrative:   89 year old female with history of asthma, bronchitis, COPD, ILD, CAD, GERD, HLD, HTN, renal insufficiency, hypothyroidism recently discharged from the hospital on 10/21/2023.  After being treated for influenza A.  Patient started having dyspnea and fever at home with poor oral intake during this time.  Upon admission noted to have likely post viral bacterial pneumonia therefore started on antibiotics, bronchodilators.  Over the course of 48 hours in the hospital patient did significantly well with oral antibiotics and steroids.  She does not have any complaints today and would like to be discharged.  She feels much stronger than she left from her previous admission.  She was able to ambulate independently. Husband is present at bedside as well.  Assessment & Plan:  Principal Problem:   Pneumonia Active Problems:   Interstitial lung disease (HCC)   Coronary atherosclerosis of native coronary artery   History of tobacco use   HLD (hyperlipidemia)   Acquired hypothyroidism   Essential hypertension   Acute renal failure superimposed on stage 3b chronic kidney disease (HCC)   Fever   Chronic diastolic CHF (congestive heart failure) (HCC)   Left lower lobe pneumonia, likely postviral Recent influenza A infection History of interstitial lung disease Today she is doing extremely well therefore we will discharge on 2 more days of p.o. doxycycline .  As needed bronchodilators at home.  Ambulating without any issues as well. Procalcitonin is negative.  BNP minimally elevated but improved compared to previous levels Urine strep pneumo antigen-negative    Acute renal failure superimposed on stage 3b chronic kidney disease (HCC)  Baseline creatinine 1.2, admission creatinine 1.66. Now stable around 1.2-1.4     Chronic diastolic CHF (congestive heart failure) (HCC) Resume her home regimen  Hypokalemia/hypocalcemia/hypomagnesemia/hypophosphatemia - As needed repletion.       Essential hypertension Continue antihypertensives as above.     Coronary atherosclerosis of native coronary artery Continue ASA, beta-blocker and statin.     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    Chronic kidney disease, stage 3b (HCC) Monitor hematocrit hemoglobin.     HLD (hyperlipidemia) Will hold atorvastatin  given body aches.     Acquired hypothyroidism Continue levothyroxine  175 mcg p.o. daily.  PT/OT: HH, f64f completed.    DVT prophylaxis: enoxaparin  (LOVENOX ) injection 30 mg Start: 10/28/23 2200     Code Status: Full Code Family Communication: Husband is present at bedside Discharge  Subjective:  Patient is feeling well no complaints.  Ambulating independently over last 24 hours.  Remains off oxygen.  Tells me she feels a whole lot stronger and would prefer to go home  Examination:  General exam: Appears calm and comfortable, elderly frail Respiratory system: Minimal rhonchi at the bases Cardiovascular system: S1 & S2 heard, RRR. No JVD, murmurs, rubs, gallops or clicks. No pedal edema. Gastrointestinal system: Abdomen is nondistended, soft and nontender. No organomegaly or masses felt. Normal bowel sounds heard. Central nervous system: Alert and oriented. No focal neurological deficits. Extremities: Symmetric 5 x 5 power. Skin: No rashes, lesions or ulcers Psychiatry: Judgement and insight appear normal. Mood & affect appropriate.

## 2023-10-29 NOTE — Progress Notes (Signed)
 Pt politely refused the flutter valve, she stated that she has one at home and it doesn't do much good with producing a cough. She declined another one at this time.

## 2023-10-29 NOTE — Plan of Care (Signed)

## 2023-10-29 NOTE — Plan of Care (Signed)
  Problem: Education: Goal: Knowledge of General Education information will improve Description: Including pain rating scale, medication(s)/side effects and non-pharmacologic comfort measures Outcome: Progressing   Problem: Health Behavior/Discharge Planning: Goal: Ability to manage health-related needs will improve Outcome: Progressing   Problem: Activity: Goal: Risk for activity intolerance will decrease Outcome: Progressing   Problem: Nutrition: Goal: Adequate nutrition will be maintained Outcome: Progressing   Problem: Coping: Goal: Level of anxiety will decrease Outcome: Progressing   Problem: Pain Management: Goal: General experience of comfort will improve Outcome: Progressing   Problem: Safety: Goal: Ability to remain free from injury will improve Outcome: Progressing   Problem: Skin Integrity: Goal: Risk for impaired skin integrity will decrease Outcome: Progressing   Problem: Education: Goal: Knowledge of disease or condition will improve Outcome: Progressing   Problem: Activity: Goal: Ability to tolerate increased activity will improve Outcome: Progressing   Problem: Activity: Goal: Ability to tolerate increased activity will improve Outcome: Progressing   Problem: Clinical Measurements: Goal: Ability to maintain a body temperature in the normal range will improve Outcome: Progressing   Problem: Respiratory: Goal: Ability to maintain adequate ventilation will improve Outcome: Progressing Goal: Ability to maintain a clear airway will improve Outcome: Progressing

## 2023-10-30 LAB — BASIC METABOLIC PANEL
Anion gap: 10 (ref 5–15)
BUN: 34 mg/dL — ABNORMAL HIGH (ref 8–23)
CO2: 19 mmol/L — ABNORMAL LOW (ref 22–32)
Calcium: 7.6 mg/dL — ABNORMAL LOW (ref 8.9–10.3)
Chloride: 99 mmol/L (ref 98–111)
Creatinine, Ser: 1.28 mg/dL — ABNORMAL HIGH (ref 0.44–1.00)
GFR, Estimated: 40 mL/min — ABNORMAL LOW (ref >60)
Glucose, Bld: 122 mg/dL — ABNORMAL HIGH (ref 70–99)
Potassium: 3.7 mmol/L (ref 3.5–5.1)
Sodium: 128 mmol/L — ABNORMAL LOW (ref 135–145)

## 2023-10-30 LAB — CBC
HCT: 27.9 % — ABNORMAL LOW (ref 36.0–46.0)
Hemoglobin: 9.7 g/dL — ABNORMAL LOW (ref 12.0–15.0)
MCH: 34.6 pg — ABNORMAL HIGH (ref 26.0–34.0)
MCHC: 34.8 g/dL (ref 30.0–36.0)
MCV: 99.6 fL (ref 80.0–100.0)
Platelets: 132 10*3/uL — ABNORMAL LOW (ref 150–400)
RBC: 2.8 MIL/uL — ABNORMAL LOW (ref 3.87–5.11)
RDW: 12.5 % (ref 11.5–15.5)
WBC: 11.7 10*3/uL — ABNORMAL HIGH (ref 4.0–10.5)
nRBC: 0 % (ref 0.0–0.2)

## 2023-10-30 LAB — PHOSPHORUS: Phosphorus: 2.1 mg/dL — ABNORMAL LOW (ref 2.5–4.6)

## 2023-10-30 LAB — BRAIN NATRIURETIC PEPTIDE: B Natriuretic Peptide: 213.6 pg/mL — ABNORMAL HIGH (ref 0.0–100.0)

## 2023-10-30 LAB — MAGNESIUM: Magnesium: 1.2 mg/dL — ABNORMAL LOW (ref 1.7–2.4)

## 2023-10-30 MED ORDER — POTASSIUM & SODIUM PHOSPHATES 280-160-250 MG PO PACK
2.0000 | PACK | Freq: Once | ORAL | Status: AC
  Administered 2023-10-30: 2 via ORAL
  Filled 2023-10-30: qty 2

## 2023-10-30 MED ORDER — CALCIUM CARBONATE ANTACID 500 MG PO CHEW
1.0000 | CHEWABLE_TABLET | Freq: Two times a day (BID) | ORAL | Status: DC
  Administered 2023-10-31: 200 mg via ORAL
  Filled 2023-10-30 (×2): qty 1

## 2023-10-30 MED ORDER — MAGNESIUM OXIDE -MG SUPPLEMENT 400 (240 MG) MG PO TABS
800.0000 mg | ORAL_TABLET | Freq: Once | ORAL | Status: AC
  Administered 2023-10-30: 800 mg via ORAL
  Filled 2023-10-30: qty 2

## 2023-10-30 MED ORDER — POTASSIUM CHLORIDE CRYS ER 20 MEQ PO TBCR
40.0000 meq | EXTENDED_RELEASE_TABLET | Freq: Once | ORAL | Status: AC
Start: 2023-10-30 — End: 2023-10-30
  Administered 2023-10-30: 40 meq via ORAL
  Filled 2023-10-30: qty 2

## 2023-10-30 MED ORDER — MAGNESIUM SULFATE 4 GM/100ML IV SOLN
4.0000 g | Freq: Once | INTRAVENOUS | Status: AC
  Administered 2023-10-30: 4 g via INTRAVENOUS
  Filled 2023-10-30: qty 100

## 2023-10-30 MED ORDER — CALCIUM GLUCONATE-NACL 2-0.675 GM/100ML-% IV SOLN
2.0000 g | Freq: Once | INTRAVENOUS | Status: AC
  Administered 2023-10-30: 2000 mg via INTRAVENOUS
  Filled 2023-10-30: qty 100

## 2023-10-30 MED ORDER — SODIUM CHLORIDE 0.9 % IV BOLUS
500.0000 mL | Freq: Once | INTRAVENOUS | Status: AC
  Administered 2023-10-30: 500 mL via INTRAVENOUS

## 2023-10-30 NOTE — TOC Initial Note (Signed)
 Transition of Care [facility]) - Initial/Assessment Note    Patient Details  Name: Cindy [name] MRN: [id number] Date of Birth: 1932/01/14  Transition of Care [facility]) CM/SW Contact:    Hoy DELENA Bigness, LCSW Phone Number: 10/30/2023, 2:29 PM  Clinical Narrative:                 Pt from home w/ spouse.  Met with pt and spouse at bedside to discuss recommendation for [facility]. Pt shares she has had HH services in the past with [facility] and is agreeable to have services with this agency again. HHPT/OT arranged w/ [facility]. HH orders need to be placed prior to discharge. Pt denies DME need and shares she has the following DME at home: walker, cane, walk in shower with seat.   Expected Discharge Plan: Home w Home Health Services Barriers to Discharge: No Barriers Identified   Patient Goals and CMS Choice Patient states their goals for this hospitalization and ongoing recovery are:: For pt to return home CMS Medicare.gov Compare Post Acute Care list provided to:: Patient Choice offered to / list presented to : Patient [place] ownership interest in [facility].provided to::  (NA)    Expected Discharge Plan and Services In-house Referral: Clinical Social Work Discharge Planning Services: NA Post Acute Care Choice: Home Health Living arrangements for the past 2 months: Single Family Home                 DME Arranged: N/A DME Agency: NA       HH Arranged: PT, OT HH Agency: [facility] Date HH Agency Contacted: 10/30/23 Time HH Agency Contacted: 1429 Representative spoke with at [facility] Agency: Amy  Prior Living Arrangements/Services Living arrangements for the past 2 months: Single Family Home Lives with:: Spouse Patient language and need for interpreter reviewed:: Yes Do you feel safe going back to the place where you live?: Yes      Need for Family Participation in Patient Care: No (Comment) Care giver support system in place?: Yes (comment) Current home services: DME (has  walker, cane, walk in shower with seat) Criminal Activity/Legal Involvement Pertinent to Current Situation/Hospitalization: No - Comment as needed  Activities of Daily Living   ADL Screening (condition at time of admission) Independently performs ADLs?: Yes (appropriate for developmental age) Is the patient deaf or have difficulty hearing?: No Does the patient have difficulty seeing, even when wearing glasses/contacts?: No Does the patient have difficulty concentrating, remembering, or making decisions?: No  Permission Sought/Granted Permission sought to share information with : Family Supports, Oceanographer granted to share information with : Yes, Verbal Permission Granted     Permission granted to share info w AGENCY: [facility]        Emotional Assessment Appearance:: Appears stated age Attitude/Demeanor/Rapport: Engaged Affect (typically observed): Accepting Orientation: : Oriented to Self, Oriented to Place, Oriented to  Time, Oriented to Situation Alcohol  / Substance Use: Not Applicable Psych Involvement: No (comment)  Admission diagnosis:  Pneumonia [J18.9] Pneumonia due to infectious organism, unspecified laterality, unspecified part of lung [J18.9] Sepsis, due to unspecified organism, unspecified whether acute organ dysfunction present [facility]) [A41.9] Acute cough [R05.1] Patient Active Problem List   Diagnosis Date Noted   Pneumonia 10/27/2023   Acute respiratory failure with hypoxia (HCC) 10/19/2023   Hypomagnesemia 10/19/2023   Hypercalcemia 10/19/2023   Lactic acidosis 10/19/2023   Influenza A 10/19/2023   Bronchiectasis with acute exacerbation 03/12/2023   Myocardial injury 03/12/2023   Acute metabolic  encephalopathy 03/11/2023   Possible acute cystitis 10/08/2022   Dehydration 10/08/2022   Acute renal failure superimposed on stage 3b chronic kidney disease (HCC) 10/08/2022   Acute prerenal azotemia 10/08/2022   Fever 10/08/2022    Chronic diastolic CHF (congestive heart failure) (HCC) 10/08/2022   Lumbar spondylosis 03/08/2021   Abdominal aortic atherosclerosis (HCC) 02/14/2021   Essential hypertension 12/25/2018   Carotid artery disease (HCC) 12/25/2018   Chest pain of uncertain etiology    Unstable angina (HCC) 11/26/2018   Thrombocytopenia (HCC) 01/06/2017   Lower urinary tract infectious disease 01/06/2017   Acquired hypothyroidism 01/06/2017   Anemia 01/06/2017   Umbilical hernia 02/04/2014   Coronary atherosclerosis of native coronary artery 12/11/2013   Old MI (myocardial infarction) 12/11/2013   History of tobacco use 12/11/2013   Angina decubitus (HCC) 12/11/2013   HLD (hyperlipidemia) 12/11/2013   Allergic rhinitis 09/16/2012   Interstitial lung disease (HCC) 05/08/2012   Pulmonary nodule, right 05/06/2012   Cough 03/20/2012   Chronic iron deficiency anemia    Chronic kidney disease, stage 3b (HCC)    PCP:  Okey Carlin Redbird, MD Pharmacy:   [facility] [address] [place] - [address] [place] [address] Phone: [phone] Fax: [phone]  [facility] [address] [place] [address] Phone: [phone] Fax: [phone]     Social Drivers of Health (SDOH) Social History: SDOH Screenings   Food Insecurity: No Food Insecurity (10/28/2023)  Housing: Low Risk  (10/28/2023)  Transportation Needs: No Transportation Needs (10/28/2023)  Utilities: Not At Risk (10/28/2023)  Social Connections: Moderately Integrated (10/28/2023)  Tobacco Use: Medium Risk (10/27/2023)   SDOH Interventions:     Readmission Risk Interventions    10/30/2023    2:27 PM 03/14/2023   10:54 AM  Readmission Risk Prevention Plan  Transportation Screening Complete Complete  HRI or Home Care Consult  Complete  Social Work Consult for Recovery Care Planning/Counseling  Complete  Palliative Care Screening  Not Applicable  Medication Review  Oceanographer) Referral to Pharmacy Referral to Pharmacy  PCP or Specialist appointment within 3-5 days of discharge Complete   HRI or Home Care Consult Complete   SW Recovery Care/Counseling Consult Complete   Palliative Care Screening Not Applicable   Skilled Nursing Facility Not Applicable

## 2023-10-30 NOTE — Evaluation (Addendum)
 Physical Therapy Evaluation Patient Details Name: Cindy [name] MRN: [id number] DOB: July 30, 1932 Today's Date: 10/30/2023  History of Present Illness  Pt is an 61 female recently discharged from the hospital on 10/21/2023. Patient started having dyspnea and fever at home with poor oral intake during this time. Upon admission 10/27/23 noted to have likely post viral bacterial pneumonia therefore started on antibiotics, bronchodilators. PMH: asthma, bronchitis, COPD, ILD, CAD, GERD, HLD, HTN, renal insufficiency, hypothyroidism  Clinical Impression  Pt admitted with above diagnosis. At baseline, pt ind without AD but reports using rollator some after last hospital admission. Pt ind with self care and laundry, spouse completes cooking and household chores. On eval, pt decline RW use despite education on safety and fall preventions, needing CGA to min A with ambulation, x2 minor LOB with pt reaching for wall and therapist to steady self. Pt denies pain, dizziness, SOB throughout eval. Recommend HHPT at d/c with family support at home. Pt currently with functional limitations due to the deficits listed below (see PT Problem List). Pt will benefit from acute skilled PT to increase their independence and safety with mobility to allow discharge.           If plan is discharge home, recommend the following: A little help with walking and/or transfers;A little help with bathing/dressing/bathroom;Assistance with cooking/housework;Assist for transportation;Help with stairs or ramp for entrance   Can travel by private vehicle        Equipment Recommendations None recommended by PT  Recommendations for Other Services       Functional Status Assessment Patient has had a recent decline in their functional status and demonstrates the ability to make significant improvements in function in a reasonable and predictable amount of time.     Precautions / Restrictions Precautions Precautions:  Fall Restrictions Weight Bearing Restrictions Per Provider Order: No      Mobility  Bed Mobility Overal bed mobility: Needs Assistance Bed Mobility: Supine to Sit     Supine to sit: Supervision, HOB elevated, Used rails     General bed mobility comments: increased time and effort, therapist managing linen, no physical assistance    Transfers Overall transfer level: Needs assistance Equipment used: None Transfers: Sit to/from Stand Sit to Stand: Contact guard assist           General transfer comment: slightly unsteady with powering to stand, declines RW use despite education, once standing needs a moment to get my bearings straight    Ambulation/Gait Ambulation/Gait assistance: Min assist, Contact guard assist Gait Distance (Feet): 100 Feet Assistive device: None Gait Pattern/deviations: Step-through pattern, Drifts right/left, Decreased stride length Gait velocity: decreased     General Gait Details: step through gait pattern with decreased cadence, declines RW use despite education, 2 minor LOB with pt reaching out for therapist and wall to steady self  Stairs            Wheelchair Mobility     Tilt Bed    Modified Rankin (Stroke Patients Only)       Balance Overall balance assessment: Needs assistance Sitting-balance support: Feet supported Sitting balance-Leahy Scale: Good     Standing balance support: No upper extremity supported Standing balance-Leahy Scale: Fair Standing balance comment: fair static, poor dynamic with CGA to min A to correct                             Pertinent Vitals/Pain Pain Assessment Pain Assessment: No/denies pain  Home Living Family/patient expects to be discharged to:: Private residence Living Arrangements: Spouse/significant other Available Help at Discharge: Family Type of Home: Other(Comment) (Condo) Home Access: Level entry (just needs to cross over the threshold) Entrance Stairs-Rails:  None Entrance Stairs-Number of Steps: 1   Home Layout: One level Home Equipment: Educational Psychologist (2 wheels);Rollator (4 wheels);BSC/3in1;Shower seat - built in;Grab bars - tub/shower      Prior Function Prior Level of Function : Independent/Modified Independent;Driving             Mobility Comments: pt reports ind at baseline, used rollator some after last hospital d/c in Dec 2024 ADLs Comments: pt reports ind with self care, laundry; spouse completes the cooking and has a cleaning service monthly     Extremity/Trunk Assessment   Upper Extremity Assessment Upper Extremity Assessment: Defer to OT evaluation    Lower Extremity Assessment Lower Extremity Assessment: Generalized weakness (AROM WFL, strength grossly 4-/5, denies numbnes/tingling throughout)    Cervical / Trunk Assessment Cervical / Trunk Assessment: Normal  Communication   Communication Communication: No apparent difficulties  Cognition Arousal: Alert Behavior During Therapy: WFL for tasks assessed/performed Overall Cognitive Status: Within Functional Limits for tasks assessed                                          General Comments      Exercises     Assessment/Plan    PT Assessment Patient needs continued PT services  PT Problem List Decreased strength;Decreased range of motion;Decreased activity tolerance;Decreased safety awareness;Cardiopulmonary status limiting activity       PT Treatment Interventions DME instruction;Gait training;Stair training;Functional mobility training;Therapeutic activities;Therapeutic exercise;Balance training;Patient/family education    PT Goals (Current goals can be found in the Care Plan section)  Acute Rehab PT Goals Patient Stated Goal: return home with spouse PT Goal Formulation: With patient/family Time For Goal Achievement: 11/13/23 Potential to Achieve Goals: Good    Frequency Min 1X/week     Co-evaluation                AM-PAC PT 6 Clicks Mobility  Outcome Measure Help needed turning from your back to your side while in a flat bed without using bedrails?: None Help needed moving from lying on your back to sitting on the side of a flat bed without using bedrails?: None Help needed moving to and from a bed to a chair (including a wheelchair)?: A Little Help needed standing up from a chair using your arms (e.g., wheelchair or bedside chair)?: A Little Help needed to walk in hospital room?: A Little Help needed climbing 3-5 steps with a railing? : A Little 6 Click Score: 20    End of Session Equipment Utilized During Treatment: Gait belt Activity Tolerance: Patient tolerated treatment well Patient left: in chair;with call bell/phone within reach;with chair alarm set;with family/visitor present Nurse Communication: Mobility status PT Visit Diagnosis: Muscle weakness (generalized) (M62.81)    Time: 1105-1130 PT Time Calculation (min) (ACUTE ONLY): 25 min   Charges:   PT Evaluation $PT Eval Low Complexity: 1 Low PT Treatments $Gait Training: 8-22 mins PT General Charges $$ ACUTE PT VISIT: 1 Visit         Tori Bennett Ram PT, DPT 10/30/23, 12:03 PM

## 2023-10-30 NOTE — Progress Notes (Signed)
 IV team consulted for difficult PIV insertion. BUE assessed with [place]. 1 unsuccessful attempt to RFA. No other vein seen for PIV access. RN aware.

## 2023-10-30 NOTE — TOC CM/SW Note (Signed)

## 2023-10-30 NOTE — Progress Notes (Signed)
 PROGRESS NOTE    Cindy [name]  FMW:[id number] DOB: [birth year] DOA: 10/27/2023 PCP: Okey Carlin Redbird, MD    Brief Narrative:   89 year old female with history of asthma, bronchitis, COPD, ILD, CAD, GERD, HLD, HTN, renal insufficiency, hypothyroidism recently discharged from the hospital on 10/21/2023.  After being treated for influenza A.  Patient started having dyspnea and fever at home with poor oral intake during this time.  Upon admission noted to have likely post viral bacterial pneumonia therefore started on antibiotics, bronchodilators.  Assessment & Plan:  Principal Problem:   Pneumonia Active Problems:   Interstitial lung disease (HCC)   Coronary atherosclerosis of native coronary artery   History of tobacco use   HLD (hyperlipidemia)   Acquired hypothyroidism   Essential hypertension   Acute renal failure superimposed on stage 3b chronic kidney disease (HCC)   Fever   Chronic diastolic CHF (congestive heart failure) (HCC)   Left lower lobe pneumonia, likely postviral Recent influenza A infection History of interstitial lung disease Continue broad-spectrum antibiotic, slowly de-escalate.  Bronchodilators, I-S/flutter valve. Procalcitonin is negative.  BNP minimally elevated but improved compared to previous levels Urine strep pneumo antigen-negative    Acute renal failure superimposed on stage 3b chronic kidney disease (HCC)  Baseline creatinine 1.2, admission creatinine 1.66.  Improved with IV fluids.  Slowly resume home medications     Chronic diastolic CHF (congestive heart failure) (HCC) Holding diuretics and ARB. Continue Imdur  90 mg p.o. daily in the morning. SheContinue metoprolol  succinate 25 mg p.o. daily.  Hypokalemia/hypocalcemia/hypomagnesemia/hypophosphatemia - As needed repletion.      Essential hypertension Continue antihypertensives as above.  IV as needed     Coronary atherosclerosis of native coronary artery Continue ASA, beta-blocker and  statin.     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    Chronic kidney disease, stage 3b (HCC) Monitor hematocrit hemoglobin.     HLD (hyperlipidemia) Will hold atorvastatin  given body aches.     Acquired hypothyroidism Continue levothyroxine  175 mcg p.o. daily.   DVT prophylaxis: enoxaparin  (LOVENOX ) injection 30 mg Start: 10/28/23 2200     Code Status: Full Code Family Communication: Husband is present at bedside Continue hospital stay for evaluation and management for her weakness.  Anticipate discharge tomorrow  Subjective:  Feels slightly stronger today.  Was able to ambulate to the nurses station.  Having quite a bit of cough  Examination:  General exam: Appears calm and comfortable, elderly frail Respiratory system: Minimal rhonchi at the bases Cardiovascular system: S1 & S2 heard, RRR. No JVD, murmurs, rubs, gallops or clicks. No pedal edema. Gastrointestinal system: Abdomen is nondistended, soft and nontender. No organomegaly or masses felt. Normal bowel sounds heard. Central nervous system: Alert and oriented. No focal neurological deficits. Extremities: Symmetric 5 x 5 power. Skin: No rashes, lesions or ulcers Psychiatry: Judgement and insight appear normal. Mood & affect appropriate.                Diet Orders (From admission, onward)     Start     Ordered   10/29/23 1020  Diet regular Room service appropriate? Yes; Fluid consistency: Thin  Diet effective now       Question Answer Comment  Room service appropriate? Yes   Fluid consistency: Thin      10/29/23 1019            Objective: Vitals:   10/29/23 2151 10/30/23 0322 10/30/23 0819 10/30/23 0922  BP: (!) 147/54 (!) 122/40  ROLLEN)  152/58  Pulse: 66 (!) 57  (!) 59  Resp: 18 18    Temp: 97.9 F (36.6 C) 97.8 F (36.6 C)    TempSrc: Oral Oral    SpO2: 99% 97% 94%   Weight:      Height:        Intake/Output Summary (Last 24 hours) at 10/30/2023 1134 Last data filed at  10/30/2023 0344 Gross per 24 hour  Intake 240 ml  Output 1225 ml  Net -985 ml   Filed Weights   10/27/23 2036  Weight: 66 kg    Scheduled Meds:  arformoterol   15 mcg Nebulization BID   aspirin  EC  81 mg Oral Daily   atorvastatin   80 mg Oral q1800   doxycycline   100 mg Oral Q12H   enoxaparin  (LOVENOX ) injection  30 mg Subcutaneous Q24H   ferrous sulfate   325 mg Oral Q breakfast   guaiFENesin   400 mg Oral BID   isosorbide  mononitrate  90 mg Oral Daily   levothyroxine   175 mcg Oral QPM   metoprolol  succinate  25 mg Oral Daily   montelukast   10 mg Oral Daily   pantoprazole   40 mg Oral BID   potassium & sodium phosphates   2 packet Oral Once   predniSONE   20 mg Oral Q breakfast   revefenacin   175 mcg Nebulization Daily   traMADol   50 mg Oral BID   Continuous Infusions:  magnesium  sulfate bolus IVPB 4 g (10/30/23 1026)    Nutritional status     Body mass index is 28.42 kg/m.  Data Reviewed:   CBC: Recent Labs  Lab 10/27/23 2051 10/29/23 0504 10/30/23 0514  WBC 10.0 14.7* 11.7*  NEUTROABS 6.9  --   --   HGB 12.1 10.3* 9.7*  HCT 35.0* 29.5* 27.9*  MCV 97.5 98.3 99.6  PLT 164 148* 132*   Basic Metabolic Panel: Recent Labs  Lab 10/27/23 2051 10/29/23 0504 10/30/23 0514  NA 137 134* 128*  K 4.2 3.6 3.7  CL 100 103 99  CO2 28 21* 19*  GLUCOSE 132* 143* 122*  BUN 41* 35* 34*  CREATININE 1.66* 1.15* 1.28*  CALCIUM  10.0 8.3* 7.6*  MG  --   --  1.2*  PHOS  --   --  2.1*   GFR: Estimated Creatinine Clearance: 25.3 mL/min (A) (by C-G formula based on SCr of 1.28 mg/dL (H)). Liver Function Tests: Recent Labs  Lab 10/27/23 2051 10/29/23 0504  AST 33 19  ALT 19 15  ALKPHOS 44 32*  BILITOT 0.9 0.9  PROT 7.2 5.1*  ALBUMIN  4.1 2.8*   Recent Labs  Lab 10/27/23 2051  LIPASE 45   No results for input(s): AMMONIA in the last 168 hours. Coagulation Profile: Recent Labs  Lab 10/27/23 2051  INR 1.0   Cardiac Enzymes: No results for input(s):  CKTOTAL, CKMB, CKMBINDEX, TROPONINI in the last 168 hours. BNP (last 3 results) No results for input(s): PROBNP in the last 8760 hours. HbA1C: No results for input(s): HGBA1C in the last 72 hours. CBG: No results for input(s): GLUCAP in the last 168 hours. Lipid Profile: No results for input(s): CHOL, HDL, LDLCALC, TRIG, CHOLHDL, LDLDIRECT in the last 72 hours. Thyroid  Function Tests: No results for input(s): TSH, T4TOTAL, FREET4, T3FREE, THYROIDAB in the last 72 hours. Anemia Panel: No results for input(s): VITAMINB12, FOLATE, FERRITIN, TIBC, IRON, RETICCTPCT in the last 72 hours. Sepsis Labs: Recent Labs  Lab 10/29/23 0504  PROCALCITON <0.10    Recent Results (  from the past 240 hours)  Resp panel by RT-PCR (RSV, Flu A&B, Covid) Anterior Nasal Swab     Status: Abnormal   Collection Time: 10/27/23  8:49 PM   Specimen: Anterior Nasal Swab  Result Value Ref Range Status   SARS Coronavirus 2 by RT PCR NEGATIVE NEGATIVE Final    Comment: (NOTE) SARS-CoV-2 target nucleic acids are NOT DETECTED.  The SARS-CoV-2 RNA is generally detectable in upper respiratory specimens during the acute phase of infection. The lowest concentration of SARS-CoV-2 viral copies this assay can detect is 138 copies/mL. A negative result does not preclude SARS-Cov-2 infection and should not be used as the sole basis for treatment or other patient management decisions. A negative result may occur with  improper specimen collection/handling, submission of specimen other than nasopharyngeal swab, presence of viral mutation(s) within the areas targeted by this assay, and inadequate number of viral copies(<138 copies/mL). A negative result must be combined with clinical observations, patient history, and epidemiological information. The expected result is Negative.  Fact Sheet for Patients:  bloggercourse.com  Fact Sheet for Healthcare  Providers:  seriousbroker.it  This test is no t yet approved or cleared by the [place]  FDA and  has been authorized for detection and/or diagnosis of SARS-CoV-2 by FDA under an Emergency Use Authorization (EUA). This EUA will remain  in effect (meaning this test can be used) for the duration of the COVID-19 declaration under Section 564(b)(1) of the Act, 21 U.S.C.section 360bbb-3(b)(1), unless the authorization is terminated  or revoked sooner.       Influenza A by PCR POSITIVE (A) NEGATIVE Final   Influenza B by PCR NEGATIVE NEGATIVE Final    Comment: (NOTE) The Xpert Xpress SARS-CoV-2/FLU/RSV plus assay is intended as an aid in the diagnosis of influenza from Nasopharyngeal swab specimens and should not be used as a sole basis for treatment. Nasal washings and aspirates are unacceptable for Xpert Xpress SARS-CoV-2/FLU/RSV testing.  Fact Sheet for Patients: bloggercourse.com  Fact Sheet for Healthcare Providers: seriousbroker.it  This test is not yet approved or cleared by the [place]  FDA and has been authorized for detection and/or diagnosis of SARS-CoV-2 by FDA under an Emergency Use Authorization (EUA). This EUA will remain in effect (meaning this test can be used) for the duration of the COVID-19 declaration under Section 564(b)(1) of the Act, 21 U.S.C. section 360bbb-3(b)(1), unless the authorization is terminated or revoked.     Resp Syncytial Virus by PCR NEGATIVE NEGATIVE Final    Comment: (NOTE) Fact Sheet for Patients: bloggercourse.com  Fact Sheet for Healthcare Providers: seriousbroker.it  This test is not yet approved or cleared by the [place]  FDA and has been authorized for detection and/or diagnosis of SARS-CoV-2 by FDA under an Emergency Use Authorization (EUA). This EUA will remain in effect (meaning this test  can be used) for the duration of the COVID-19 declaration under Section 564(b)(1) of the Act, 21 U.S.C. section 360bbb-3(b)(1), unless the authorization is terminated or revoked.  Performed at [facility], [address] [place], [place] [address]   Urine Culture     Status: Abnormal   Collection Time: 10/27/23  8:49 PM   Specimen: Urine, Clean Catch  Result Value Ref Range Status   Specimen Description   Final    URINE, CLEAN CATCH Performed at [facility], [address], [place], [place] [address]    Special Requests   Final    NONE Performed at [facility], [phone], [place], [place] [address]    Culture (A)  Final    <10,000 COLONIES/mL INSIGNIFICANT GROWTH Performed at [facility], [address], [place], [place] [address]    Report Status 10/29/2023 FINAL  Final  Blood culture (routine x 2)     Status: None (Preliminary result)   Collection Time: 10/27/23  8:51 PM   Specimen: BLOOD  Result Value Ref Range Status   Specimen Description   Final    BLOOD LEFT ANTECUBITAL Performed at [facility], [address] [place], [place] [address]    Special Requests   Final    BOTTLES DRAWN AEROBIC ONLY Performed at [facility], [address] [place], [place] [address]    Culture   Final    NO GROWTH 2 DAYS Performed at [facility], [address], [place], [place] [address]    Report Status PENDING  Incomplete  Blood culture (routine x 2)     Status: None (Preliminary result)   Collection Time: 10/28/23 11:54 AM   Specimen: BLOOD LEFT HAND  Result Value Ref Range Status   Specimen Description   Final    BLOOD LEFT HAND Performed at [facility], [address], [place], [place] [address]    Special Requests   Final    BOTTLES DRAWN AEROBIC ONLY Blood Culture results may not be optimal due to an inadequate volume of blood received in culture  bottles   Culture   Final    NO GROWTH 2 DAYS Performed at [facility], [address] [place], [place] [address]    Report Status PENDING  Incomplete  Respiratory (~20 pathogens) panel by PCR     Status: Abnormal   Collection Time: 10/28/23 12:52 PM   Specimen: Nasopharyngeal Swab; Respiratory  Result Value Ref Range Status   Adenovirus NOT DETECTED NOT DETECTED Final   Coronavirus 229E NOT DETECTED NOT DETECTED Final    Comment: (NOTE) The Coronavirus on the Respiratory Panel, DOES NOT test for the novel  Coronavirus (2019 nCoV)    Coronavirus HKU1 NOT DETECTED NOT DETECTED Final   Coronavirus NL63 NOT DETECTED NOT DETECTED Final   Coronavirus OC43 NOT DETECTED NOT DETECTED Final   Metapneumovirus NOT DETECTED NOT DETECTED Final   Rhinovirus / Enterovirus NOT DETECTED NOT DETECTED Final   Influenza A H3 DETECTED (A) NOT DETECTED Final   Influenza B NOT DETECTED NOT DETECTED Final   Parainfluenza Virus 1 NOT DETECTED NOT DETECTED Final   Parainfluenza Virus 2 NOT DETECTED NOT DETECTED Final   Parainfluenza Virus 3 NOT DETECTED NOT DETECTED Final   Parainfluenza Virus 4 NOT DETECTED NOT DETECTED Final   Respiratory Syncytial Virus NOT DETECTED NOT DETECTED Final   Bordetella pertussis NOT DETECTED NOT DETECTED Final   Bordetella Parapertussis NOT DETECTED NOT DETECTED Final   Chlamydophila pneumoniae NOT DETECTED NOT DETECTED Final   Mycoplasma pneumoniae NOT DETECTED NOT DETECTED Final    Comment: Performed at [facility], [address] [place], [place] [address]  MRSA Next Gen by PCR, Nasal     Status: None   Collection Time: 10/28/23  9:09 PM   Specimen: Nasal Mucosa; Nasal Swab  Result Value Ref Range Status   MRSA by PCR Next Gen NOT DETECTED NOT DETECTED Final    Comment: (NOTE) The GeneXpert MRSA Assay (FDA approved for NASAL specimens only), is one component of a comprehensive MRSA colonization surveillance program. It  is not intended to diagnose MRSA  infection nor to guide or monitor treatment for MRSA infections. Test performance is not FDA approved in patients less than 78 years old. Performed at [facility], [address], [place], [place] [address]          Radiology Studies: No results found.         LOS: 1 day   Time spent= 35 mins    Burgess JAYSON Dare, MD [facility]  If 7PM-7AM, please contact night-coverage  10/30/2023, 11:34 AM

## 2023-10-31 DIAGNOSIS — J1000 Influenza due to other identified influenza virus with unspecified type of pneumonia: Secondary | ICD-10-CM

## 2023-10-31 LAB — BASIC METABOLIC PANEL
Anion gap: 9 (ref 5–15)
BUN: 37 mg/dL — ABNORMAL HIGH (ref 8–23)
CO2: 21 mmol/L — ABNORMAL LOW (ref 22–32)
Calcium: 8.3 mg/dL — ABNORMAL LOW (ref 8.9–10.3)
Chloride: 102 mmol/L (ref 98–111)
Creatinine, Ser: 1.45 mg/dL — ABNORMAL HIGH (ref 0.44–1.00)
GFR, Estimated: 34 mL/min — ABNORMAL LOW (ref >60)
Glucose, Bld: 78 mg/dL (ref 70–99)
Potassium: 4.3 mmol/L (ref 3.5–5.1)
Sodium: 132 mmol/L — ABNORMAL LOW (ref 135–145)

## 2023-10-31 LAB — CBC
HCT: 30.4 % — ABNORMAL LOW (ref 36.0–46.0)
Hemoglobin: 10.3 g/dL — ABNORMAL LOW (ref 12.0–15.0)
MCH: 34 pg (ref 26.0–34.0)
MCHC: 33.9 g/dL (ref 30.0–36.0)
MCV: 100.3 fL — ABNORMAL HIGH (ref 80.0–100.0)
Platelets: 144 10*3/uL — ABNORMAL LOW (ref 150–400)
RBC: 3.03 MIL/uL — ABNORMAL LOW (ref 3.87–5.11)
RDW: 12.9 % (ref 11.5–15.5)
WBC: 11 10*3/uL — ABNORMAL HIGH (ref 4.0–10.5)
nRBC: 0 % (ref 0.0–0.2)

## 2023-10-31 LAB — MAGNESIUM: Magnesium: 2.6 mg/dL — ABNORMAL HIGH (ref 1.7–2.4)

## 2023-10-31 MED ORDER — PREDNISONE 20 MG PO TABS: 20.0000 mg | ORAL_TABLET | Freq: Every day | ORAL | 0 refills | Status: AC

## 2023-10-31 MED ORDER — DOXYCYCLINE HYCLATE 100 MG PO TABS: 100.0000 mg | ORAL_TABLET | Freq: Two times a day (BID) | ORAL | 0 refills | Status: AC

## 2023-10-31 NOTE — Plan of Care (Signed)
 Problem: Education: Goal: Knowledge of General Education information will improve Description: Including pain rating scale, medication(s)/side effects and non-pharmacologic comfort measures 10/31/2023 0159 by Kathrynn Lavern LABOR, RN Outcome: Progressing 10/31/2023 0159 by Kathrynn Lavern LABOR, RN Outcome: Progressing   Problem: Health Behavior/Discharge Planning: Goal: Ability to manage health-related needs will improve 10/31/2023 0159 by Kathrynn Lavern LABOR, RN Outcome: Progressing 10/31/2023 0159 by Kathrynn Lavern LABOR, RN Outcome: Progressing   Problem: Clinical Measurements: Goal: Ability to maintain clinical measurements within normal limits will improve 10/31/2023 0159 by Kathrynn Lavern LABOR, RN Outcome: Progressing 10/31/2023 0159 by Kathrynn Lavern LABOR, RN Outcome: Progressing Goal: Will remain free from infection 10/31/2023 0159 by Kathrynn Lavern LABOR, RN Outcome: Progressing 10/31/2023 0159 by Kathrynn Lavern LABOR, RN Outcome: Progressing Goal: Diagnostic test results will improve 10/31/2023 0159 by Kathrynn Lavern LABOR, RN Outcome: Progressing 10/31/2023 0159 by Kathrynn Lavern LABOR, RN Outcome: Progressing Goal: Respiratory complications will improve 10/31/2023 0159 by Kathrynn Lavern LABOR, RN Outcome: Progressing 10/31/2023 0159 by Kathrynn Lavern LABOR, RN Outcome: Progressing Goal: Cardiovascular complication will be avoided 10/31/2023 0159 by Kathrynn Lavern LABOR, RN Outcome: Progressing 10/31/2023 0159 by Kathrynn Lavern LABOR, RN Outcome: Progressing   Problem: Activity: Goal: Risk for activity intolerance will decrease 10/31/2023 0159 by Kathrynn Lavern LABOR, RN Outcome: Progressing 10/31/2023 0159 by Kathrynn Lavern LABOR, RN Outcome: Progressing   Problem: Nutrition: Goal: Adequate nutrition will be maintained 10/31/2023 0159 by Kathrynn Lavern LABOR, RN Outcome: Progressing 10/31/2023 0159 by Kathrynn Lavern LABOR, RN Outcome: Progressing   Problem: Coping: Goal: Level of anxiety will decrease 10/31/2023  0159 by Kathrynn Lavern LABOR, RN Outcome: Progressing 10/31/2023 0159 by Kathrynn Lavern LABOR, RN Outcome: Progressing   Problem: Elimination: Goal: Will not experience complications related to bowel motility 10/31/2023 0159 by Kathrynn Lavern LABOR, RN Outcome: Progressing 10/31/2023 0159 by Kathrynn Lavern LABOR, RN Outcome: Progressing Goal: Will not experience complications related to urinary retention 10/31/2023 0159 by Kathrynn Lavern LABOR, RN Outcome: Progressing 10/31/2023 0159 by Kathrynn Lavern LABOR, RN Outcome: Progressing   Problem: Pain Management: Goal: General experience of comfort will improve 10/31/2023 0159 by Kathrynn Lavern LABOR, RN Outcome: Progressing 10/31/2023 0159 by Kathrynn Lavern LABOR, RN Outcome: Progressing   Problem: Safety: Goal: Ability to remain free from injury will improve 10/31/2023 0159 by Kathrynn Lavern LABOR, RN Outcome: Progressing 10/31/2023 0159 by Kathrynn Lavern LABOR, RN Outcome: Progressing   Problem: Skin Integrity: Goal: Risk for impaired skin integrity will decrease 10/31/2023 0159 by Kathrynn Lavern LABOR, RN Outcome: Progressing 10/31/2023 0159 by Kathrynn Lavern LABOR, RN Outcome: Progressing   Problem: Education: Goal: Knowledge of disease or condition will improve 10/31/2023 0159 by Kathrynn Lavern LABOR, RN Outcome: Progressing 10/31/2023 0159 by Kathrynn Lavern LABOR, RN Outcome: Progressing Goal: Knowledge of the prescribed therapeutic regimen will improve 10/31/2023 0159 by Kathrynn Lavern LABOR, RN Outcome: Progressing 10/31/2023 0159 by Kathrynn Lavern LABOR, RN Outcome: Progressing Goal: Individualized Educational Video(s) 10/31/2023 0159 by Kathrynn Lavern LABOR, RN Outcome: Progressing 10/31/2023 0159 by Kathrynn Lavern LABOR, RN Outcome: Progressing   Problem: Activity: Goal: Ability to tolerate increased activity will improve 10/31/2023 0159 by Kathrynn Lavern LABOR, RN Outcome: Progressing 10/31/2023 0159 by Kathrynn Lavern LABOR, RN Outcome: Progressing Goal: Will verbalize the  importance of balancing activity with adequate rest periods 10/31/2023 0159 by Kathrynn Lavern LABOR, RN Outcome: Progressing 10/31/2023 0159 by Kathrynn Lavern LABOR, RN Outcome: Progressing   Problem: Respiratory: Goal: Ability to maintain a clear airway will improve 10/31/2023 0159 by Kathrynn Lavern LABOR, RN Outcome: Progressing 10/31/2023 0159  by Kathrynn Lavern LABOR, RN Outcome: Progressing Goal: Levels of oxygenation will improve 10/31/2023 0159 by Kathrynn Lavern LABOR, RN Outcome: Progressing 10/31/2023 0159 by Kathrynn Lavern LABOR, RN Outcome: Progressing Goal: Ability to maintain adequate ventilation will improve 10/31/2023 0159 by Kathrynn Lavern LABOR, RN Outcome: Progressing 10/31/2023 0159 by Kathrynn Lavern LABOR, RN Outcome: Progressing   Problem: Activity: Goal: Ability to tolerate increased activity will improve 10/31/2023 0159 by Kathrynn Lavern LABOR, RN Outcome: Progressing 10/31/2023 0159 by Kathrynn Lavern LABOR, RN Outcome: Progressing   Problem: Clinical Measurements: Goal: Ability to maintain a body temperature in the normal range will improve 10/31/2023 0159 by Kathrynn Lavern LABOR, RN Outcome: Progressing 10/31/2023 0159 by Kathrynn Lavern LABOR, RN Outcome: Progressing   Problem: Respiratory: Goal: Ability to maintain adequate ventilation will improve 10/31/2023 0159 by Kathrynn Lavern LABOR, RN Outcome: Progressing 10/31/2023 0159 by Kathrynn Lavern LABOR, RN Outcome: Progressing Goal: Ability to maintain a clear airway will improve 10/31/2023 0159 by Kathrynn Lavern LABOR, RN Outcome: Progressing 10/31/2023 0159 by Kathrynn Lavern LABOR, RN Outcome: Progressing

## 2023-10-31 NOTE — Progress Notes (Signed)
 Occupational Therapy Treatment Patient Details Name: Cindy [name] MRN: [id number] DOB: 23-Feb-1932 Today's Date: 10/31/2023   History of present illness Pt is an 49 female recently discharged from the hospital on 10/21/2023. Patient started having dyspnea and fever at home with poor oral intake during this time. Upon admission 10/27/23 noted to have likely post viral bacterial pneumonia therefore started on antibiotics, bronchodilators. PMH: asthma, bronchitis, COPD, ILD, CAD, GERD, HLD, HTN, renal insufficiency, hypothyroidism   OT comments  Pt with awareness of decreased balance, but does not want to use AD. Pt using furniture/door frame in her room for stability to bathroom, provided CGA in hallway. Pt with SpO2 of 97%, HR 73 with ambulation. Denies shortness of breath. Educated pt and family in energy conservation strategies, recommended pt use her shower seat at home initially, and fall prevention. Pt is eager to return home later today.       If plan is discharge home, recommend the following:  A little help with walking and/or transfers;A little help with bathing/dressing/bathroom;Assist for transportation;Assistance with cooking/housework   Equipment Recommendations  None recommended by OT    Recommendations for Other Services      Precautions / Restrictions Precautions Precautions: Fall Restrictions Weight Bearing Restrictions Per Provider Order: No       Mobility Bed Mobility Overal bed mobility: Needs Assistance Bed Mobility: Supine to Sit     Supine to sit: Supervision, HOB elevated     General bed mobility comments: increased time and effort    Transfers Overall transfer level: Needs assistance Equipment used: None Transfers: Sit to/from Stand Sit to Stand: Contact guard assist           General transfer comment: no overt LOB, pt takes her time to sit and stand momentarily prior to walking, seeks stability on foot board, door frame     Balance Overall  balance assessment: Needs assistance   Sitting balance-Leahy Scale: Good     Standing balance support: No upper extremity supported Standing balance-Leahy Scale: Fair Standing balance comment: fair static, poor dynamic with CGA to min A to correct                           ADL either performed or assessed with clinical judgement   ADL Overall ADL's : Needs assistance/impaired     Grooming: Wash/dry hands;Standing;Supervision/safety           Upper Body Dressing : Minimal assistance;Standing   Lower Body Dressing: Maximal assistance;Bed level Lower Body Dressing Details (indicate cue type and reason): pt is assisted with socks at baseline Toilet Transfer: Contact guard assist;Ambulation   Toileting- Clothing Manipulation and Hygiene: Supervision/safety;Sit to/from stand       Functional mobility during ADLs: Contact guard assist General ADL Comments: Educated in benefits of seated showering, energy conservation strategies and fall prevention.    Extremity/Trunk Assessment              Vision       Perception     Praxis      Cognition Arousal: Alert Behavior During Therapy: WFL for tasks assessed/performed Overall Cognitive Status: Within Functional Limits for tasks assessed                                          Exercises      Shoulder Instructions  General Comments      Pertinent Vitals/ Pain       Pain Assessment Pain Assessment: No/denies pain  Home Living                                          Prior Functioning/Environment              Frequency  Min 1X/week        Progress Toward Goals  OT Goals(current goals can now be found in the care plan section)  Progress towards OT goals: Progressing toward goals  Acute Rehab OT Goals OT Goal Formulation: With patient Time For Goal Achievement: 11/12/23 Potential to Achieve Goals: Good  Plan      Co-evaluation                  AM-PAC OT 6 Clicks Daily Activity     Outcome Measure   Help from another person eating meals?: None Help from another person taking care of personal grooming?: A Little Help from another person toileting, which includes using toliet, bedpan, or urinal?: A Little Help from another person bathing (including washing, rinsing, drying)?: A Little Help from another person to put on and taking off regular upper body clothing?: A Little Help from another person to put on and taking off regular lower body clothing?: A Little 6 Click Score: 19    End of Session Equipment Utilized During Treatment: Gait belt  OT Visit Diagnosis: Unsteadiness on feet (R26.81);Muscle weakness (generalized) (M62.81)   Activity Tolerance Patient tolerated treatment well   Patient Left in bed;with call bell/phone within reach;with family/visitor present   Nurse Communication Mobility status;Other (comment) (SpO2 97% on RA)        Time: 8980-8957 OT Time Calculation (min): 23 min  Charges: OT General Charges $OT Visit: 1 Visit OT Treatments $Self Care/Home Management : 23-37 mins  Mliss HERO, OTR/L Acute Rehabilitation Services Office: [phone]   Kennth Mliss Helling 10/31/2023, 11:59 AM

## 2023-10-31 NOTE — TOC Transition Note (Signed)
 Transition of Care [facility]) - Discharge Note   Patient Details  Name: Cindy [name] MRN: [id number] Date of Birth: 24-Apr-1932  [facility] CM/SW Contact:  Hoy DELENA Bigness, LCSW Phone Number: 10/31/2023, 10:35 AM   Clinical Narrative:    Pt is to return home with family. Pt will be receiving home health PT/OT through [facility]. Family to transport pt home at discharge. No further TOC needs identified. TOC signing off.    Final next level of care: Home w Home Health Services Barriers to Discharge: No Barriers Identified   Patient Goals and CMS Choice Patient states their goals for this hospitalization and ongoing recovery are:: For pt to return home CMS Medicare.gov Compare Post Acute Care list provided to:: Patient Choice offered to / list presented to : Patient [place] ownership interest in [facility].provided to::  (NA)    Discharge Placement                       Discharge Plan and Services Additional resources added to the After Visit Summary for   In-house Referral: Clinical Social Work Discharge Planning Services: NA Post Acute Care Choice: Home Health          DME Arranged: N/A DME Agency: NA       HH Arranged: PT, OT HH Agency: [facility] Date HH Agency Contacted: 10/30/23 Time HH Agency Contacted: 1429 Representative spoke with at [facility] Agency: Amy  Social Drivers of Health (SDOH) Interventions SDOH Screenings   Food Insecurity: No Food Insecurity (10/28/2023)  Housing: Low Risk  (10/28/2023)  Transportation Needs: No Transportation Needs (10/28/2023)  Utilities: Not At Risk (10/28/2023)  Social Connections: Moderately Integrated (10/28/2023)  Tobacco Use: Medium Risk (10/27/2023)     Readmission Risk Interventions    10/30/2023    2:27 PM 03/14/2023   10:54 AM  Readmission Risk Prevention Plan  Transportation Screening Complete Complete  HRI or Home Care Consult  Complete  Social Work Consult for Recovery Care  Planning/Counseling  Complete  Palliative Care Screening  Not Applicable  Medication Review [employer]) Referral to Pharmacy Referral to Pharmacy  PCP or Specialist appointment within 3-5 days of discharge Complete   HRI or Home Care Consult Complete   SW Recovery Care/Counseling Consult Complete   Palliative Care Screening Not Applicable   Skilled Nursing Facility Not Applicable

## 2023-10-31 NOTE — Progress Notes (Signed)
SATURATION QUALIFICATIONS: (This note is used to comply with regulatory documentation for home oxygen)  Patient Saturations on Room Air at Rest = 97%  Patient Saturations on Room Air while Ambulating = 97%  

## 2023-10-31 NOTE — Discharge Summary (Signed)
 Physician Discharge Summary  Cindy [name] FMW:[id number] DOB: 09/04/1932 DOA: 10/27/2023  PCP: Okey Carlin Redbird, MD  Admit date: 10/27/2023 Discharge date: 10/31/2023  Admitted From: Home Disposition: Home  Recommendations for Outpatient Follow-up:  Follow up with PCP in 1-2 weeks Please obtain BMP/CBC in one week your next doctors visit.  Complete doxycycline  and prednisone  course   Discharge Condition: Stable CODE STATUS: Full code Diet recommendation: Heart healthy   Brief Narrative:   89 year old female with history of asthma, bronchitis, COPD, ILD, CAD, GERD, HLD, HTN, renal insufficiency, hypothyroidism recently discharged from the hospital on 10/21/2023.  After being treated for influenza A.  Patient started having dyspnea and fever at home with poor oral intake during this time.  Upon admission noted to have likely post viral bacterial pneumonia therefore started on antibiotics, bronchodilators.  Over the course of 48 hours in the hospital patient did significantly well with oral antibiotics and steroids.  She does not have any complaints today and would like to be discharged.  She feels much stronger than she left from her previous admission.  She was able to ambulate independently. Husband is present at bedside as well.  Assessment & Plan:  Principal Problem:   Pneumonia Active Problems:   Interstitial lung disease (HCC)   Coronary atherosclerosis of native coronary artery   History of tobacco use   HLD (hyperlipidemia)   Acquired hypothyroidism   Essential hypertension   Acute renal failure superimposed on stage 3b chronic kidney disease (HCC)   Fever   Chronic diastolic CHF (congestive heart failure) (HCC)   Left lower lobe pneumonia, likely postviral Recent influenza A infection History of interstitial lung disease Today she is doing extremely well therefore we will discharge on 2 more days of p.o. doxycycline .  As needed bronchodilators at home.  Ambulating  without any issues as well. Procalcitonin is negative.  BNP minimally elevated but improved compared to previous levels Urine strep pneumo antigen-negative    Acute renal failure superimposed on stage 3b chronic kidney disease (HCC)  Baseline creatinine 1.2, admission creatinine 1.66. Now stable around 1.2-1.4     Chronic diastolic CHF (congestive heart failure) (HCC) Resume her home regimen  Hypokalemia/hypocalcemia/hypomagnesemia/hypophosphatemia - As needed repletion.      Essential hypertension Continue antihypertensives as above.     Coronary atherosclerosis of native coronary artery Continue ASA, beta-blocker and statin.     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    Chronic kidney disease, stage 3b (HCC) Monitor hematocrit hemoglobin.     HLD (hyperlipidemia) Will hold atorvastatin  given body aches.     Acquired hypothyroidism Continue levothyroxine  175 mcg p.o. daily.  PT/OT: HH, f81f completed.    DVT prophylaxis: enoxaparin  (LOVENOX ) injection 30 mg Start: 10/28/23 2200     Code Status: Full Code Family Communication: Husband is present at bedside Discharge  Subjective:  Patient is feeling well no complaints.  Ambulating independently over last 24 hours.  Remains off oxygen.  Tells me she feels a whole lot stronger and would prefer to go home  Examination:  General exam: Appears calm and comfortable, elderly frail Respiratory system: Minimal rhonchi at the bases Cardiovascular system: S1 & S2 heard, RRR. No JVD, murmurs, rubs, gallops or clicks. No pedal edema. Gastrointestinal system: Abdomen is nondistended, soft and nontender. No organomegaly or masses felt. Normal bowel sounds heard. Central nervous system: Alert and oriented. No focal neurological deficits. Extremities: Symmetric 5 x 5 power. Skin: No rashes, lesions or ulcers Psychiatry:  Judgement and insight appear normal. Mood & affect appropriate.    Discharge Diagnoses:   Principal Problem:   Pneumonia Active Problems:   Interstitial lung disease (HCC)   Coronary atherosclerosis of native coronary artery   History of tobacco use   HLD (hyperlipidemia)   Acquired hypothyroidism   Essential hypertension   Acute renal failure superimposed on stage 3b chronic kidney disease (HCC)   Fever   Chronic diastolic CHF (congestive heart failure) (HCC)      Discharge Exam: Vitals:   10/31/23 0604 10/31/23 0908  BP: (!) 140/43   Pulse: (!) 58   Resp: 18   Temp: 98.1 F (36.7 C)   SpO2: 98% 99%   Vitals:   10/30/23 2008 10/30/23 2124 10/31/23 0604 10/31/23 0908  BP:  (!) 151/62 (!) 140/43   Pulse:  75 (!) 58   Resp:  20 18   Temp:  97.9 F (36.6 C) 98.1 F (36.7 C)   TempSrc:   Oral   SpO2: 98% 100% 98% 99%  Weight:      Height:          Discharge Instructions   Allergies as of 10/31/2023       Reactions   Codeine Other (See Comments)   Headache from heavy doses of codeine only   Tussin Dm [guaifenesin -dm] Other (See Comments)   Almost killed me         Medication List     TAKE these medications    albuterol  108 (90 Base) MCG/ACT inhaler Commonly known as: VENTOLIN  HFA Inhale 1 puff into the lungs every 4 (four) hours as needed for wheezing or shortness of breath. What changed:  how much to take when to take this   aspirin  81 MG tablet Take 81 mg by mouth daily.   atorvastatin  80 MG tablet Commonly known as: LIPITOR  Take 1 tablet (80 mg total) by mouth daily.   benzonatate  200 MG capsule Commonly known as: TESSALON  Take 1 capsule by mouth twice daily as needed for cough What changed:  how much to take how to take this when to take this reasons to take this additional instructions   Calcium  Carbonate-Vitamin D 600-200 MG-UNIT Tabs Take 1 tablet by mouth 2 (two) times daily.   doxycycline  100 MG tablet Commonly known as: VIBRA -TABS Take 1 tablet (100 mg total) by mouth every 12 (twelve) hours for 2 days.    ferrous sulfate  325 (65 FE) MG tablet Take 325 mg by mouth daily with breakfast.   fexofenadine  180 MG tablet Commonly known as: ALLEGRA  Take 1 tablet (180 mg total) by mouth daily.   guaifenesin  400 MG Tabs tablet Commonly known as: HUMIBID E Take 400 mg by mouth in the morning and at bedtime.   ICAPS AREDS 2 PO Take 1 capsule by mouth in the morning and at bedtime.   ipratropium 0.06 % nasal spray Commonly known as: ATROVENT  Place 2 sprays into both nostrils 3 (three) times daily as needed for rhinitis.   isosorbide  mononitrate 30 MG 24 hr tablet Commonly known as: IMDUR  Take 90 mg by mouth daily.   levothyroxine  175 MCG tablet Commonly known as: SYNTHROID  Take 175 mcg by mouth every evening.   losartan  50 MG tablet Commonly known as: COZAAR  Take 50 mg by mouth daily.   metoprolol  succinate 25 MG 24 hr tablet Commonly known as: TOPROL -XL Take 1 tablet (25 mg total) by mouth daily.   montelukast  10 MG tablet Commonly known as: SINGULAIR  Take 10  mg by mouth daily.   NICOTINAMIDE PO Take 1 capsule by mouth 2 (two) times daily.   nitroGLYCERIN  0.4 MG SL tablet Commonly known as: NITROSTAT  PLACE 1 TABLET UNDER THE TONGUE EVERY 5 MINUTES AS NEEDED FOR CHEST PAIN What changed: See the new instructions.   pantoprazole  40 MG tablet Commonly known as: PROTONIX  Take 40 mg by mouth 2 (two) times daily.   predniSONE  20 MG tablet Commonly known as: DELTASONE  Take 1 tablet (20 mg total) by mouth daily with breakfast for 3 days. Start taking on: November 01, 2023 What changed: when to take this   spironolactone -hydrochlorothiazide  25-25 MG tablet Commonly known as: ALDACTAZIDE Take 1 tablet by mouth daily.   TART CHERRY PO Take 1 tablet by mouth 2 (two) times a week.   traMADol  50 MG tablet Commonly known as: ULTRAM  Take 50 mg by mouth 2 (two) times daily.        Follow-up Information     [facility] Follow up.   Why: Leopoldo will follow  up with you at discharge to provide home health services Contact information: [address] [place] [address] [phone]         Okey Carlin Redbird, MD Follow up in 1 week(s).   Specialty: Family Medicine Contact information: [address] [place] [address] [phone]                Allergies  Allergen Reactions   Codeine Other (See Comments)    Headache from heavy doses of codeine only   Tussin Dm [Guaifenesin -Dm] Other (See Comments)    Almost killed me     You were cared for by a hospitalist during your hospital stay. If you have any questions about your discharge medications or the care you received while you were in the hospital after you are discharged, you can call the unit and asked to speak with the hospitalist on call if the hospitalist that took care of you is not available. Once you are discharged, your primary care physician will handle any further medical issues. Please note that no refills for any discharge medications will be authorized once you are discharged, as it is imperative that you return to your primary care physician (or establish a relationship with a primary care physician if you do not have one) for your aftercare needs so that they can reassess your need for medications and monitor your lab values.  You were cared for by a hospitalist during your hospital stay. If you have any questions about your discharge medications or the care you received while you were in the hospital after you are discharged, you can call the unit and asked to speak with the hospitalist on call if the hospitalist that took care of you is not available. Once you are discharged, your primary care physician will handle any further medical issues. Please note that NO REFILLS for any discharge medications will be authorized once you are discharged, as it is imperative that you return to your primary care physician (or establish a relationship with a primary  care physician if you do not have one) for your aftercare needs so that they can reassess your need for medications and monitor your lab values.  Please request your Prim.MD to go over all Hospital Tests and Procedure/Radiological results at the follow up, please get all Hospital records sent to your Prim MD by signing hospital release before you go home.  Get CBC, CMP, 2  view Chest X ray checked  by Primary MD during your next visit or SNF MD in 5-7 days ( we routinely change or add medications that can affect your baseline labs and fluid status, therefore we recommend that you get the mentioned basic workup next visit with your PCP, your PCP may decide not to get them or add new tests based on their clinical decision)  On your next visit with your primary care physician please Get Medicines reviewed and adjusted.  If you experience worsening of your admission symptoms, develop shortness of breath, life threatening emergency, suicidal or homicidal thoughts you must seek medical attention immediately by calling 911 or calling your MD immediately  if symptoms less severe.  You Must read complete instructions/literature along with all the possible adverse reactions/side effects for all the Medicines you take and that have been prescribed to you. Take any new Medicines after you have completely understood and accpet all the possible adverse reactions/side effects.   Do not drive, operate heavy machinery, perform activities at heights, swimming or participation in water activities or provide baby sitting services if your were admitted for syncope or siezures until you have seen by Primary MD or a Neurologist and advised to do so again.  Do not drive when taking Pain medications.   Procedures/Studies: DG Chest Portable 1 View Result Date: 10/27/2023 CLINICAL DATA:  Altered mental status. Recently hospitalized for UTI pneumonia. EXAM: PORTABLE CHEST 1 VIEW COMPARISON:  10/19/2023 FINDINGS: Stable  cardiomediastinal silhouette. Sternotomy. Fractured inferior sternal wire. Aortic atherosclerotic calcification. Diffuse interstitial coarsening. Left basilar atelectasis or infiltrates. No definite pleural effusion. No pneumothorax. IMPRESSION: Interstitial coarsening may be chronic or due to edema or infection. Left basilar atelectasis or infiltrates. Electronically Signed   By: Norman Gatlin M.D.   On: 10/27/2023 21:44   CT Angio Chest PE W and/or Wo Contrast Result Date: 10/19/2023 CLINICAL DATA:  Pulmonary embolism (PE) suspected, high prob. Confusion, lethargy. Cough. Cold-like symptoms. EXAM: CT ANGIOGRAPHY CHEST WITH CONTRAST TECHNIQUE: Multidetector CT imaging of the chest was performed using the standard protocol during bolus administration of intravenous contrast. Multiplanar CT image reconstructions and MIPs were obtained to evaluate the vascular anatomy. RADIATION DOSE REDUCTION: This exam was performed according to the departmental dose-optimization program which includes automated exposure control, adjustment of the mA and/or kV according to patient size and/or use of iterative reconstruction technique. CONTRAST:  75mL OMNIPAQUE  IOHEXOL  350 MG/ML SOLN COMPARISON:  CT scan chest from 08/02/2023. FINDINGS: Cardiovascular: No evidence of embolism to the proximal subsegmental pulmonary artery level. Normal cardiac size. No pericardial effusion. No aortic aneurysm. There are coronary artery calcifications, in keeping with coronary artery disease. There are also moderate peripheral atherosclerotic vascular calcifications of thoracic aorta and its major branches. Mediastinum/Nodes: Visualized thyroid  gland appears grossly unremarkable. No solid / cystic mediastinal masses. The esophagus is nondistended precluding optimal assessment. There are few mildly prominent mediastinal and hilar lymph nodes, which do not meet the size criteria for lymphadenopathy and appear grossly similar to the prior study,  favoring benign etiology. No axillary lymphadenopathy by size criteria. Lungs/Pleura: The central tracheo-bronchial tree is patent. There are occlusive and nonocclusive filling defects in the subsegmental and distal bronchial tree, likely due to mucus/secretions. There is new moderate, smooth, circumferential thickening of the segmental and subsegmental bronchial walls, throughout bilateral lungs, which is nonspecific. Findings are most commonly seen with bronchitis or reactive airway disease, such as asthma. There are patchy areas of linear, plate-like atelectasis and/or scarring throughout bilateral lungs,  with asymmetric involvement of the bilateral lower lobes. Redemonstration of mild peripheral/subpleural reticulations predominantly in the bilateral lower lobes with associated patchy areas of bronchiectasis. No honeycombing. Minimal paraseptal emphysematous changes noted. Findings favor probable UIP pattern. The no mass or consolidation. No pleural effusion or pneumothorax. There is a stable sub 4 mm solid noncalcified nodule in the middle lobe, stable since the prior study from 10/12/2020. No new or suspicious lung nodules. Upper Abdomen: There is a stable subcentimeter sized hypoattenuating structure in the right kidney upper pole, posteriorly. Remaining visualized upper abdominal viscera within normal limits. Musculoskeletal: The visualized soft tissues of the chest wall are grossly unremarkable. No suspicious osseous lesions. There are mild to moderate multilevel degenerative changes in the visualized spine. Redemonstration of mild-to-moderate anterior wedging deformity of T5 vertebral body, similar to the prior study. No significant retropulsion or spinal canal compromise. Redemonstration of partial fusion of T12-L1 vertebral bodies. Review of the MIP images confirms the above findings. IMPRESSION: 1. No embolism to the proximal subsegmental pulmonary artery level. 2. New moderate thickening of the walls  of the bronchial tree, nonspecific but most commonly seen with bronchitis or reactive airway disease such as asthma. 3. No lung mass, consolidation, pleural effusion or pneumothorax. New patchy area of atelectasis predominantly in the bilateral lower lobes. 4. Multiple other nonacute observations, as described above. Aortic Atherosclerosis (ICD10-I70.0). Electronically Signed   By: Ree Molt M.D.   On: 10/19/2023 15:03   CT Head Wo Contrast Result Date: 10/19/2023 CLINICAL DATA:  Confusion and lethargy beginning yesterday. Mental status change. EXAM: CT HEAD WITHOUT CONTRAST TECHNIQUE: Contiguous axial images were obtained from the base of the skull through the vertex without intravenous contrast. RADIATION DOSE REDUCTION: This exam was performed according to the departmental dose-optimization program which includes automated exposure control, adjustment of the mA and/or kV according to patient size and/or use of iterative reconstruction technique. COMPARISON:  CT head without contrast 03/11/2023 FINDINGS: Brain: No acute infarct, hemorrhage, or mass lesion is present. Stable mild atrophy and white matter changes are likely within normal limits for age. Remote white matter infarct is again noted in the left parietal lobe. Deep brain nuclei are within normal limits. The ventricles are of normal size. No significant extraaxial fluid collection is present. The brainstem and cerebellum are within normal limits. Midline structures are within normal limits. Vascular: Atherosclerotic calcifications are again noted within the cavernous internal carotid arteries bilaterally. No hyperdense vessel is present. Skull: Calvarium is intact. No focal lytic or blastic lesions are present. No significant extracranial soft tissue lesion is present. Sinuses/Orbits: The paranasal sinuses and mastoid air cells are clear. The globes and orbits are within normal limits. IMPRESSION: 1. No acute intracranial abnormality or  significant interval change. 2. Stable mild atrophy and white matter changes are likely within normal limits for age. 3. 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 Electronically Signed   By: Lonni Necessary M.D.   On: 10/19/2023 11:10   DG Chest Port 1 View Result Date: 10/19/2023 CLINICAL DATA:  Cough for 2 days. EXAM: PORTABLE CHEST 1 VIEW COMPARISON:  Mar 11, 2023. FINDINGS: The heart size and mediastinal contours are within normal limits. Sternotomy wires are noted. No acute pulmonary disease. The visualized skeletal structures are unremarkable. IMPRESSION: No active disease. Electronically Signed   By: Lynwood Landy Raddle M.D.   On: 10/19/2023 11:04     The results of significant diagnostics from this hospitalization (including imaging, microbiology, ancillary and laboratory) are listed  below for reference.     Microbiology: Recent Results (from the past 240 hours)  Resp panel by RT-PCR (RSV, Flu A&B, Covid) Anterior Nasal Swab     Status: Abnormal   Collection Time: 10/27/23  8:49 PM   Specimen: Anterior Nasal Swab  Result Value Ref Range Status   SARS Coronavirus 2 by RT PCR NEGATIVE NEGATIVE Final    Comment: (NOTE) SARS-CoV-2 target nucleic acids are NOT DETECTED.  The SARS-CoV-2 RNA is generally detectable in upper respiratory specimens during the acute phase of infection. The lowest concentration of SARS-CoV-2 viral copies this assay can detect is 138 copies/mL. A negative result does not preclude SARS-Cov-2 infection and should not be used as the sole basis for treatment or other patient management decisions. A negative result may occur with  improper specimen collection/handling, submission of specimen other than nasopharyngeal swab, presence of viral mutation(s) within the areas targeted by this assay, and inadequate number of viral copies(<138 copies/mL). A negative result must be combined with clinical observations, patient history, and  epidemiological information. The expected result is Negative.  Fact Sheet for Patients:  bloggercourse.com  Fact Sheet for Healthcare Providers:  seriousbroker.it  This test is no t yet approved or cleared by the [place]  FDA and  has been authorized for detection and/or diagnosis of SARS-CoV-2 by FDA under an Emergency Use Authorization (EUA). This EUA will remain  in effect (meaning this test can be used) for the duration of the COVID-19 declaration under Section 564(b)(1) of the Act, 21 U.S.C.section 360bbb-3(b)(1), unless the authorization is terminated  or revoked sooner.       Influenza A by PCR POSITIVE (A) NEGATIVE Final   Influenza B by PCR NEGATIVE NEGATIVE Final    Comment: (NOTE) The Xpert Xpress SARS-CoV-2/FLU/RSV plus assay is intended as an aid in the diagnosis of influenza from Nasopharyngeal swab specimens and should not be used as a sole basis for treatment. Nasal washings and aspirates are unacceptable for Xpert Xpress SARS-CoV-2/FLU/RSV testing.  Fact Sheet for Patients: bloggercourse.com  Fact Sheet for Healthcare Providers: seriousbroker.it  This test is not yet approved or cleared by the [place]  FDA and has been authorized for detection and/or diagnosis of SARS-CoV-2 by FDA under an Emergency Use Authorization (EUA). This EUA will remain in effect (meaning this test can be used) for the duration of the COVID-19 declaration under Section 564(b)(1) of the Act, 21 U.S.C. section 360bbb-3(b)(1), unless the authorization is terminated or revoked.     Resp Syncytial Virus by PCR NEGATIVE NEGATIVE Final    Comment: (NOTE) Fact Sheet for Patients: bloggercourse.com  Fact Sheet for Healthcare Providers: seriousbroker.it  This test is not yet approved or cleared by the [place]  FDA and has  been authorized for detection and/or diagnosis of SARS-CoV-2 by FDA under an Emergency Use Authorization (EUA). This EUA will remain in effect (meaning this test can be used) for the duration of the COVID-19 declaration under Section 564(b)(1) of the Act, 21 U.S.C. section 360bbb-3(b)(1), unless the authorization is terminated or revoked.  Performed at [facility], [address] [place], [place] [address]   Urine Culture     Status: Abnormal   Collection Time: 10/27/23  8:49 PM   Specimen: Urine, Clean Catch  Result Value Ref Range Status   Specimen Description   Final    URINE, CLEAN CATCH Performed at [facility], [address] [place], [place] [address]    Special Requests   Final  NONE Performed at [facility], [address], [place], [place] [address]    Culture (A)  Final    <10,000 COLONIES/mL INSIGNIFICANT GROWTH Performed at [facility], [address] [place], [place] [address]    Report Status 10/29/2023 FINAL  Final  Blood culture (routine x 2)     Status: None (Preliminary result)   Collection Time: 10/27/23  8:51 PM   Specimen: BLOOD  Result Value Ref Range Status   Specimen Description   Final    BLOOD LEFT ANTECUBITAL Performed at [facility], [address] [place], [place] [address]    Special Requests   Final    BOTTLES DRAWN AEROBIC ONLY Performed at [facility], [address] [place], [place] [address]    Culture   Final    NO GROWTH 3 DAYS Performed at [facility], [address], [place], [place] [address]    Report Status PENDING  Incomplete  Blood culture (routine x 2)     Status: None (Preliminary result)   Collection Time: 10/28/23 11:54 AM   Specimen: BLOOD LEFT HAND  Result Value Ref Range Status   Specimen Description   Final    BLOOD LEFT HAND Performed at [facility], [address], [place], [place]  [address]    Special Requests   Final    BOTTLES DRAWN AEROBIC ONLY Blood Culture results may not be optimal due to an inadequate volume of blood received in culture bottles   Culture   Final    NO GROWTH 3 DAYS Performed at [facility], [address], [place], [place] [address]    Report Status PENDING  Incomplete  Respiratory (~20 pathogens) panel by PCR     Status: Abnormal   Collection Time: 10/28/23 12:52 PM   Specimen: Nasopharyngeal Swab; Respiratory  Result Value Ref Range Status   Adenovirus NOT DETECTED NOT DETECTED Final   Coronavirus 229E NOT DETECTED NOT DETECTED Final    Comment: (NOTE) The Coronavirus on the Respiratory Panel, DOES NOT test for the novel  Coronavirus (2019 nCoV)    Coronavirus HKU1 NOT DETECTED NOT DETECTED Final   Coronavirus NL63 NOT DETECTED NOT DETECTED Final   Coronavirus OC43 NOT DETECTED NOT DETECTED Final   Metapneumovirus NOT DETECTED NOT DETECTED Final   Rhinovirus / Enterovirus NOT DETECTED NOT DETECTED Final   Influenza A H3 DETECTED (A) NOT DETECTED Final   Influenza B NOT DETECTED NOT DETECTED Final   Parainfluenza Virus 1 NOT DETECTED NOT DETECTED Final   Parainfluenza Virus 2 NOT DETECTED NOT DETECTED Final   Parainfluenza Virus 3 NOT DETECTED NOT DETECTED Final   Parainfluenza Virus 4 NOT DETECTED NOT DETECTED Final   Respiratory Syncytial Virus NOT DETECTED NOT DETECTED Final   Bordetella pertussis NOT DETECTED NOT DETECTED Final   Bordetella Parapertussis NOT DETECTED NOT DETECTED Final   Chlamydophila pneumoniae NOT DETECTED NOT DETECTED Final   Mycoplasma pneumoniae NOT DETECTED NOT DETECTED Final    Comment: Performed at [facility], [address], [place], [place] [address]  MRSA Next Gen by PCR, Nasal     Status: None   Collection Time: 10/28/23  9:09 PM   Specimen: Nasal Mucosa; Nasal Swab  Result Value Ref Range Status   MRSA by PCR Next Gen NOT DETECTED NOT DETECTED Final    Comment: (NOTE) The GeneXpert  MRSA Assay (FDA approved for NASAL specimens only), is  one component of a comprehensive MRSA colonization surveillance program. It is not intended to diagnose MRSA infection nor to guide or monitor treatment for MRSA infections. Test performance is not FDA approved in patients less than 11 years old. Performed at [facility], [address], [place], [place] [address]      Labs: BNP (last 3 results) Recent Labs    03/11/23 1947 10/29/23 0504 10/30/23 0514  BNP 504.3* 291.5* 213.6*   Basic Metabolic Panel: Recent Labs  Lab 10/27/23 2051 10/29/23 0504 10/30/23 0514 10/31/23 0527  NA 137 134* 128* 132*  K 4.2 3.6 3.7 4.3  CL 100 103 99 102  CO2 28 21* 19* 21*  GLUCOSE 132* 143* 122* 78  BUN 41* 35* 34* 37*  CREATININE 1.66* 1.15* 1.28* 1.45*  CALCIUM  10.0 8.3* 7.6* 8.3*  MG  --   --  1.2* 2.6*  PHOS  --   --  2.1*  --    Liver Function Tests: Recent Labs  Lab 10/27/23 2051 10/29/23 0504  AST 33 19  ALT 19 15  ALKPHOS 44 32*  BILITOT 0.9 0.9  PROT 7.2 5.1*  ALBUMIN  4.1 2.8*   Recent Labs  Lab 10/27/23 2051  LIPASE 45   No results for input(s): AMMONIA in the last 168 hours. CBC: Recent Labs  Lab 10/27/23 2051 10/29/23 0504 10/30/23 0514 10/31/23 0527  WBC 10.0 14.7* 11.7* 11.0*  NEUTROABS 6.9  --   --   --   HGB 12.1 10.3* 9.7* 10.3*  HCT 35.0* 29.5* 27.9* 30.4*  MCV 97.5 98.3 99.6 100.3*  PLT 164 148* 132* 144*   Cardiac Enzymes: No results for input(s): CKTOTAL, CKMB, CKMBINDEX, TROPONINI in the last 168 hours. BNP: Invalid input(s): POCBNP CBG: No results for input(s): GLUCAP in the last 168 hours. D-Dimer No results for input(s): DDIMER in the last 72 hours. Hgb A1c No results for input(s): HGBA1C in the last 72 hours. Lipid Profile No results for input(s): CHOL, HDL, LDLCALC, TRIG, CHOLHDL, LDLDIRECT in the last 72 hours. Thyroid  function studies No results for input(s): TSH,  T4TOTAL, T3FREE, THYROIDAB in the last 72 hours.  Invalid input(s): FREET3 Anemia work up No results for input(s): VITAMINB12, FOLATE, FERRITIN, TIBC, IRON, RETICCTPCT in the last 72 hours. Urinalysis    Component Value Date/Time   COLORURINE COLORLESS (A) 10/27/2023 2049   APPEARANCEUR CLEAR 10/27/2023 2049   LABSPEC 1.014 10/27/2023 2049   PHURINE 5.5 10/27/2023 2049   GLUCOSEU NEGATIVE 10/27/2023 2049   HGBUR NEGATIVE 10/27/2023 2049   BILIRUBINUR NEGATIVE 10/27/2023 2049   KETONESUR NEGATIVE 10/27/2023 2049   PROTEINUR NEGATIVE 10/27/2023 2049   NITRITE NEGATIVE 10/27/2023 2049   LEUKOCYTESUR NEGATIVE 10/27/2023 2049   Sepsis Labs Recent Labs  Lab 10/27/23 2051 10/29/23 0504 10/30/23 0514 10/31/23 0527  WBC 10.0 14.7* 11.7* 11.0*   Microbiology Recent Results (from the past 240 hours)  Resp panel by RT-PCR (RSV, Flu A&B, Covid) Anterior Nasal Swab     Status: Abnormal   Collection Time: 10/27/23  8:49 PM   Specimen: Anterior Nasal Swab  Result Value Ref Range Status   SARS Coronavirus 2 by RT PCR NEGATIVE NEGATIVE Final    Comment: (NOTE) SARS-CoV-2 target nucleic acids are NOT DETECTED.  The SARS-CoV-2 RNA is generally detectable in upper respiratory specimens during the acute phase of infection. The lowest concentration of SARS-CoV-2 viral copies this assay can detect is 138 copies/mL. A negative result does not preclude SARS-Cov-2 infection and should not  be used as the sole basis for treatment or other patient management decisions. A negative result may occur with  improper specimen collection/handling, submission of specimen other than nasopharyngeal swab, presence of viral mutation(s) within the areas targeted by this assay, and inadequate number of viral copies(<138 copies/mL). A negative result must be combined with clinical observations, patient history, and epidemiological information. The expected result is Negative.  Fact Sheet  for Patients:  bloggercourse.com  Fact Sheet for Healthcare Providers:  seriousbroker.it  This test is no t yet approved or cleared by the [place]  FDA and  has been authorized for detection and/or diagnosis of SARS-CoV-2 by FDA under an Emergency Use Authorization (EUA). This EUA will remain  in effect (meaning this test can be used) for the duration of the COVID-19 declaration under Section 564(b)(1) of the Act, 21 U.S.C.section 360bbb-3(b)(1), unless the authorization is terminated  or revoked sooner.       Influenza A by PCR POSITIVE (A) NEGATIVE Final   Influenza B by PCR NEGATIVE NEGATIVE Final    Comment: (NOTE) The Xpert Xpress SARS-CoV-2/FLU/RSV plus assay is intended as an aid in the diagnosis of influenza from Nasopharyngeal swab specimens and should not be used as a sole basis for treatment. Nasal washings and aspirates are unacceptable for Xpert Xpress SARS-CoV-2/FLU/RSV testing.  Fact Sheet for Patients: bloggercourse.com  Fact Sheet for Healthcare Providers: seriousbroker.it  This test is not yet approved or cleared by the [place]  FDA and has been authorized for detection and/or diagnosis of SARS-CoV-2 by FDA under an Emergency Use Authorization (EUA). This EUA will remain in effect (meaning this test can be used) for the duration of the COVID-19 declaration under Section 564(b)(1) of the Act, 21 U.S.C. section 360bbb-3(b)(1), unless the authorization is terminated or revoked.     Resp Syncytial Virus by PCR NEGATIVE NEGATIVE Final    Comment: (NOTE) Fact Sheet for Patients: bloggercourse.com  Fact Sheet for Healthcare Providers: seriousbroker.it  This test is not yet approved or cleared by the [place]  FDA and has been authorized for detection and/or diagnosis of SARS-CoV-2 by FDA under an  Emergency Use Authorization (EUA). This EUA will remain in effect (meaning this test can be used) for the duration of the COVID-19 declaration under Section 564(b)(1) of the Act, 21 U.S.C. section 360bbb-3(b)(1), unless the authorization is terminated or revoked.  Performed at [facility], [address], [place], [place] [address]   Urine Culture     Status: Abnormal   Collection Time: 10/27/23  8:49 PM   Specimen: Urine, Clean Catch  Result Value Ref Range Status   Specimen Description   Final    URINE, CLEAN CATCH Performed at [facility], [address], [place], [place] [address]    Special Requests   Final    NONE Performed at [facility], [address] [place], [place] [address]    Culture (A)  Final    <10,000 COLONIES/mL INSIGNIFICANT GROWTH Performed at [facility], [address], [place], [place] [address]    Report Status 10/29/2023 FINAL  Final  Blood culture (routine x 2)     Status: None (Preliminary result)   Collection Time: 10/27/23  8:51 PM   Specimen: BLOOD  Result Value Ref Range Status   Specimen Description   Final    BLOOD LEFT ANTECUBITAL Performed at [facility], [address] [place], [place] [address]    Special Requests   Final  BOTTLES DRAWN AEROBIC ONLY Performed at [facility], [address] [place], [place] [address]    Culture   Final    NO GROWTH 3 DAYS Performed at [facility], [address] [place], [place] [address]    Report Status PENDING  Incomplete  Blood culture (routine x 2)     Status: None (Preliminary result)   Collection Time: 10/28/23 11:54 AM   Specimen: BLOOD LEFT HAND  Result Value Ref Range Status   Specimen Description   Final    BLOOD LEFT HAND Performed at [facility], [address], [place], [place] [address]    Special Requests   Final    BOTTLES DRAWN AEROBIC ONLY Blood  Culture results may not be optimal due to an inadequate volume of blood received in culture bottles   Culture   Final    NO GROWTH 3 DAYS Performed at [facility], [address], [place], [place] [address]    Report Status PENDING  Incomplete  Respiratory (~20 pathogens) panel by PCR     Status: Abnormal   Collection Time: 10/28/23 12:52 PM   Specimen: Nasopharyngeal Swab; Respiratory  Result Value Ref Range Status   Adenovirus NOT DETECTED NOT DETECTED Final   Coronavirus 229E NOT DETECTED NOT DETECTED Final    Comment: (NOTE) The Coronavirus on the Respiratory Panel, DOES NOT test for the novel  Coronavirus (2019 nCoV)    Coronavirus HKU1 NOT DETECTED NOT DETECTED Final   Coronavirus NL63 NOT DETECTED NOT DETECTED Final   Coronavirus OC43 NOT DETECTED NOT DETECTED Final   Metapneumovirus NOT DETECTED NOT DETECTED Final   Rhinovirus / Enterovirus NOT DETECTED NOT DETECTED Final   Influenza A H3 DETECTED (A) NOT DETECTED Final   Influenza B NOT DETECTED NOT DETECTED Final   Parainfluenza Virus 1 NOT DETECTED NOT DETECTED Final   Parainfluenza Virus 2 NOT DETECTED NOT DETECTED Final   Parainfluenza Virus 3 NOT DETECTED NOT DETECTED Final   Parainfluenza Virus 4 NOT DETECTED NOT DETECTED Final   Respiratory Syncytial Virus NOT DETECTED NOT DETECTED Final   Bordetella pertussis NOT DETECTED NOT DETECTED Final   Bordetella Parapertussis NOT DETECTED NOT DETECTED Final   Chlamydophila pneumoniae NOT DETECTED NOT DETECTED Final   Mycoplasma pneumoniae NOT DETECTED NOT DETECTED Final    Comment: Performed at [facility], [address], [place], [place] [address]  MRSA Next Gen by PCR, Nasal     Status: None   Collection Time: 10/28/23  9:09 PM   Specimen: Nasal Mucosa; Nasal Swab  Result Value Ref Range Status   MRSA by PCR Next Gen NOT DETECTED NOT DETECTED Final    Comment: (NOTE) The GeneXpert MRSA Assay (FDA approved for NASAL specimens only), is one component of  a comprehensive MRSA colonization surveillance program. It is not intended to diagnose MRSA infection nor to guide or monitor treatment for MRSA infections. Test performance is not FDA approved in patients less than 22 years old. Performed at [facility], [address] [place], [place] [address]      Time coordinating discharge:  I have spent 35 minutes face to face with the patient and on the ward discussing the patients care, assessment, plan and disposition with other care givers. >50% of the time was devoted counseling the patient about the risks and benefits of treatment/Discharge disposition and coordinating care.   SIGNED:   Burgess JAYSON Dare, MD  [facility] 10/31/2023, 11:41 AM   If 7PM-7AM, please contact night-coverage

## 2023-11-01 DIAGNOSIS — J189 Pneumonia, unspecified organism: Secondary | ICD-10-CM | POA: Diagnosis not present

## 2023-11-01 DIAGNOSIS — N179 Acute kidney failure, unspecified: Secondary | ICD-10-CM | POA: Diagnosis not present

## 2023-11-01 DIAGNOSIS — E7800 Pure hypercholesterolemia, unspecified: Secondary | ICD-10-CM | POA: Diagnosis not present

## 2023-11-01 DIAGNOSIS — E039 Hypothyroidism, unspecified: Secondary | ICD-10-CM | POA: Diagnosis not present

## 2023-11-01 DIAGNOSIS — I5032 Chronic diastolic (congestive) heart failure: Secondary | ICD-10-CM | POA: Diagnosis not present

## 2023-11-01 DIAGNOSIS — I2510 Atherosclerotic heart disease of native coronary artery without angina pectoris: Secondary | ICD-10-CM | POA: Diagnosis not present

## 2023-11-01 DIAGNOSIS — J449 Chronic obstructive pulmonary disease, unspecified: Secondary | ICD-10-CM | POA: Diagnosis not present

## 2023-11-01 DIAGNOSIS — I252 Old myocardial infarction: Secondary | ICD-10-CM | POA: Diagnosis not present

## 2023-11-01 DIAGNOSIS — N1832 Chronic kidney disease, stage 3b: Secondary | ICD-10-CM | POA: Diagnosis not present

## 2023-11-01 DIAGNOSIS — I130 Hypertensive heart and chronic kidney disease with heart failure and stage 1 through stage 4 chronic kidney disease, or unspecified chronic kidney disease: Secondary | ICD-10-CM | POA: Diagnosis not present

## 2023-11-02 LAB — CULTURE, BLOOD (ROUTINE X 2)
Culture: NO GROWTH
Culture: NO GROWTH

## 2023-11-21 ENCOUNTER — Encounter (HOSPITAL_BASED_OUTPATIENT_CLINIC_OR_DEPARTMENT_OTHER): Payer: Self-pay | Admitting: Pulmonary Disease

## 2023-11-21 ENCOUNTER — Telehealth: Payer: Self-pay | Admitting: Pulmonary Disease

## 2023-11-21 ENCOUNTER — Ambulatory Visit (HOSPITAL_BASED_OUTPATIENT_CLINIC_OR_DEPARTMENT_OTHER): Payer: Medicare (Managed Care) | Admitting: Pulmonary Disease

## 2023-11-21 ENCOUNTER — Other Ambulatory Visit (HOSPITAL_BASED_OUTPATIENT_CLINIC_OR_DEPARTMENT_OTHER): Payer: Self-pay

## 2023-11-21 VITALS — BP 126/56 | HR 66 | Ht 60.5 in | Wt 147.7 lb

## 2023-11-21 DIAGNOSIS — J84112 Idiopathic pulmonary fibrosis: Secondary | ICD-10-CM | POA: Diagnosis not present

## 2023-11-21 DIAGNOSIS — R053 Chronic cough: Secondary | ICD-10-CM

## 2023-11-21 MED ORDER — FUROSEMIDE 20 MG PO TABS: 20.0000 mg | ORAL_TABLET | Freq: Every day | ORAL | 0 refills | Status: DC

## 2023-11-21 MED ORDER — FUROSEMIDE 20 MG PO TABS: ORAL_TABLET | ORAL | 0 refills | Status: DC

## 2023-11-21 NOTE — Patient Instructions (Addendum)
X lasix 20 mg daily x 5 days    VISIT SUMMARY:  You visited Cindy [name] today for a follow-up on your Idiopathic Pulmonary Fibrosis (IPF) and other recent health issues. Your IPF remains stable, and your chronic cough has improved. We also discussed your recent post-viral pneumonia, intermittent leg swelling, and history of urinary tract infection. Additionally, we reviewed your general health maintenance, including the importance of timely flu vaccinations.  YOUR PLAN:  -IDIOPATHIC PULMONARY FIBROSIS (IPF): IPF is a lung disease that causes scarring of the lung tissue, making it difficult to breathe. Your condition has been stable since 2021, and your recent CT scan showed no significant progression. We discussed antifibrotic medications, but decided against them due to potential side effects and your current stable condition. We will continue with annual CT scans and follow up in six months. Please monitor for any worsening of breathlessness or cough.  -POST-VIRAL PNEUMONIA: Post-viral pneumonia is a lung infection that can occur after a viral illness. You were hospitalized and treated with antibiotics and bronchodilators, and your condition has much improved. No new medications are required at this time.  -PERIPHERAL EDEMA: Peripheral edema is swelling caused by fluid retention, often in the legs. You have been experiencing intermittent leg swelling. We discussed using diuretics to help reduce the fluid retention, which may increase urination. You will take the diuretic for three days and monitor your leg swelling, adjusting the use as needed.

## 2023-11-21 NOTE — Telephone Encounter (Signed)
error

## 2023-11-21 NOTE — Progress Notes (Signed)
Subjective:    Patient ID: Cindy [name], female    DOB: 14-Nov-1931, 89 y.o.   MRN: [id number]  HPI  89 yo remote ex-smoker for FU of ILD/NSIP, chronic cough  & stable RLL pulmonary nodule. She presented in 2013 with persistent wheezing and cough    -likely related to ILD other possibilities include GERD and upper airway cough HRCT suggests probable UIP , likely IPF.  Course has been somewhat benign over the last 10 years with only mild progression.  Serology is negative except for positive ANA .  would not tolerate anti fibrotics   PMH -CABG in 83, severe osteo-arthritis -Tessalon Perles have not helped. Tussionex caused her to be drowsy/ incoherent requiring ED visit .  She smoked 1.5 packs per day for 25 years before quitting in '81  Adm on 12/202 for influenza A.  Readm for post viral bacterial pneumonia -treated with  oral antibiotics and steroids.    Discussed the use of AI scribe software for clinical note transcription with the patient, who gave verbal consent to proceed.  History of Present Illness   The patient, with a history of Idiopathic Pulmonary Fibrosis (IPF) dating back to 2021, presents for a follow-up visit. The patient was hospitalized twice recently, first for influenza A and then for post-viral pneumonia. The patient was treated with antibiotics and bronchodilators and has since improved. However, the patient continues to experience a chronic cough, which has improved without the need for medication. The patient also reports leg swelling, which has been intermittent. The patient has been receiving home health care for physical therapy, which has been beneficial in improving strength and balance. The patient also reports a history of urinary tract infection.       Significant tests/ events reviewed   07/2022 ANA 1:320, speckled 09/2020 ANA 1-3 20, CCP negative   HRCT chest 07/2023 >> stable compared to 2021, probable UIP HRCT Chest 07/2022 >> probable UIP,  slightly worse compared to 2014   09/2020 HRCT  chest >> probable UIP , unchanged from 2019   HRCT chest August 2019 that showed fibrotic interstitial lung disease without frank honeycombing and without progression since 2015 ? NSIP   HRCT 03/2012 ILD pattern is nonspecific, and could represent either early usual interstitial pneumonia (UIP), or nonspecific interstitial pneumonia (NSIP). 8mm RLL nodule    CT chest 07/19/14 -mild subpleural fibroiss unchanged from 03/2012. Scattered pulmonary nodules are unchanged (considered benign with 2 yr serial follow up ) .    PFTs 03/2012 no airway obstruction, preserved lung volumes, isolated decrease in DLCO to 56% corrects for Va.   Spirometry 04/2013 - preserved lung function w/ no airflow obstruction  Review of Systems neg for any significant sore throat, dysphagia, itching, sneezing, nasal congestion or excess/ purulent secretions, fever, chills, sweats, unintended wt loss, pleuritic or exertional cp, hempoptysis, orthopnea pnd or change in chronic leg swelling. Also denies presyncope, palpitations, heartburn, abdominal pain, nausea, vomiting, diarrhea or change in bowel or urinary habits, dysuria,hematuria, rash, arthralgias, visual complaints, headache, numbness weakness or ataxia.     Objective:   Physical Exam  Gen. Pleasant, obese, in no distress, elderly ENT - no lesions, no post nasal drip Neck: No JVD, no thyromegaly, no carotid bruits Lungs: no use of accessory muscles, no dullness to percussion, bibasal dry rales no rhonchi  Cardiovascular: Rhythm regular, heart sounds  normal, no murmurs or gallops, no peripheral edema Musculoskeletal: No deformities, no cyanosis or clubbing , no tremors  Assessment & Plan:    Assessment and Plan    Idiopathic Pulmonary Fibrosis (IPF) Well-managed IPF since 2021. Last CT in October 2024 showed no significant progression. Chronic cough has improved. Discussed antifibrotic medications  (nintedanib, pirfenidone) but deferred due to potential side effects (diarrhea, liver issues) and current stable condition. Explained these medications can stabilize disease but may not be well-tolerated. - Annual CT scans - Follow-up in six months - Monitor for worsening dyspnea or cough  Post-viral Pneumonia Hospitalized in January for post-viral pneumonia following influenza A infection in December. Treated with antibiotics and bronchodilators. Condition much improved. - No new medications required  Peripheral Edema Intermittent leg swelling, occurring more frequently. Discussed diuretics for fluid retention, noting potential for increased urination. - Prescribe lasix 20  for three days - Monitor leg swelling and adjust diuretic use as needed  Urinary Tract Infection (UTI) UTI diagnosed during initial hospitalization for influenza A. No current symptoms or ongoing treatment.  General Health Maintenance Did not receive flu shot timely this year. Discussed importance of receiving flu shot in September or October. Received RSV vaccine. - Administer flu shot  Follow-up - Follow-up in six months.

## 2023-11-26 DIAGNOSIS — J189 Pneumonia, unspecified organism: Secondary | ICD-10-CM | POA: Diagnosis not present

## 2023-11-26 DIAGNOSIS — E039 Hypothyroidism, unspecified: Secondary | ICD-10-CM | POA: Diagnosis not present

## 2023-11-26 DIAGNOSIS — N179 Acute kidney failure, unspecified: Secondary | ICD-10-CM | POA: Diagnosis not present

## 2023-11-26 DIAGNOSIS — I2510 Atherosclerotic heart disease of native coronary artery without angina pectoris: Secondary | ICD-10-CM | POA: Diagnosis not present

## 2023-11-26 DIAGNOSIS — I130 Hypertensive heart and chronic kidney disease with heart failure and stage 1 through stage 4 chronic kidney disease, or unspecified chronic kidney disease: Secondary | ICD-10-CM | POA: Diagnosis not present

## 2023-11-26 DIAGNOSIS — E7800 Pure hypercholesterolemia, unspecified: Secondary | ICD-10-CM | POA: Diagnosis not present

## 2023-11-26 DIAGNOSIS — I5032 Chronic diastolic (congestive) heart failure: Secondary | ICD-10-CM | POA: Diagnosis not present

## 2023-11-26 DIAGNOSIS — N1832 Chronic kidney disease, stage 3b: Secondary | ICD-10-CM | POA: Diagnosis not present

## 2023-11-26 DIAGNOSIS — J449 Chronic obstructive pulmonary disease, unspecified: Secondary | ICD-10-CM | POA: Diagnosis not present

## 2023-11-26 DIAGNOSIS — I252 Old myocardial infarction: Secondary | ICD-10-CM | POA: Diagnosis not present

## 2023-12-13 DIAGNOSIS — Z0000 Encounter for general adult medical examination without abnormal findings: Secondary | ICD-10-CM | POA: Diagnosis not present

## 2023-12-13 DIAGNOSIS — E039 Hypothyroidism, unspecified: Secondary | ICD-10-CM | POA: Diagnosis not present

## 2023-12-13 DIAGNOSIS — Z23 Encounter for immunization: Secondary | ICD-10-CM | POA: Diagnosis not present

## 2023-12-13 DIAGNOSIS — I2510 Atherosclerotic heart disease of native coronary artery without angina pectoris: Secondary | ICD-10-CM | POA: Diagnosis not present

## 2023-12-13 DIAGNOSIS — I10 Essential (primary) hypertension: Secondary | ICD-10-CM | POA: Diagnosis not present

## 2023-12-13 DIAGNOSIS — G894 Chronic pain syndrome: Secondary | ICD-10-CM | POA: Diagnosis not present

## 2023-12-13 DIAGNOSIS — Z6827 Body mass index (BMI) 27.0-27.9, adult: Secondary | ICD-10-CM | POA: Diagnosis not present

## 2023-12-13 DIAGNOSIS — J849 Interstitial pulmonary disease, unspecified: Secondary | ICD-10-CM | POA: Diagnosis not present

## 2023-12-13 DIAGNOSIS — E785 Hyperlipidemia, unspecified: Secondary | ICD-10-CM | POA: Diagnosis not present

## 2023-12-25 DIAGNOSIS — J449 Chronic obstructive pulmonary disease, unspecified: Secondary | ICD-10-CM | POA: Diagnosis not present

## 2023-12-25 DIAGNOSIS — I252 Old myocardial infarction: Secondary | ICD-10-CM | POA: Diagnosis not present

## 2023-12-25 DIAGNOSIS — I130 Hypertensive heart and chronic kidney disease with heart failure and stage 1 through stage 4 chronic kidney disease, or unspecified chronic kidney disease: Secondary | ICD-10-CM | POA: Diagnosis not present

## 2023-12-25 DIAGNOSIS — N179 Acute kidney failure, unspecified: Secondary | ICD-10-CM | POA: Diagnosis not present

## 2023-12-25 DIAGNOSIS — N1832 Chronic kidney disease, stage 3b: Secondary | ICD-10-CM | POA: Diagnosis not present

## 2023-12-25 DIAGNOSIS — E7800 Pure hypercholesterolemia, unspecified: Secondary | ICD-10-CM | POA: Diagnosis not present

## 2023-12-25 DIAGNOSIS — J189 Pneumonia, unspecified organism: Secondary | ICD-10-CM | POA: Diagnosis not present

## 2023-12-25 DIAGNOSIS — E039 Hypothyroidism, unspecified: Secondary | ICD-10-CM | POA: Diagnosis not present

## 2023-12-25 DIAGNOSIS — I5032 Chronic diastolic (congestive) heart failure: Secondary | ICD-10-CM | POA: Diagnosis not present

## 2023-12-25 DIAGNOSIS — I2510 Atherosclerotic heart disease of native coronary artery without angina pectoris: Secondary | ICD-10-CM | POA: Diagnosis not present

## 2024-02-11 ENCOUNTER — Emergency Department (HOSPITAL_BASED_OUTPATIENT_CLINIC_OR_DEPARTMENT_OTHER): Payer: Medicare (Managed Care)

## 2024-02-11 ENCOUNTER — Inpatient Hospital Stay (HOSPITAL_BASED_OUTPATIENT_CLINIC_OR_DEPARTMENT_OTHER)
Admission: EM | Admit: 2024-02-11 | Discharge: 2024-02-16 | DRG: 871 | Disposition: A | Discharge status: Home / Self Care | Payer: Medicare (Managed Care) | Attending: Family Medicine | Admitting: Family Medicine

## 2024-02-11 ENCOUNTER — Encounter (HOSPITAL_BASED_OUTPATIENT_CLINIC_OR_DEPARTMENT_OTHER): Payer: Self-pay | Admitting: Emergency Medicine

## 2024-02-11 DIAGNOSIS — Z87440 Personal history of urinary (tract) infections: Secondary | ICD-10-CM

## 2024-02-11 DIAGNOSIS — R7881 Bacteremia: Secondary | ICD-10-CM | POA: Diagnosis not present

## 2024-02-11 DIAGNOSIS — R000 Tachycardia, unspecified: Secondary | ICD-10-CM | POA: Diagnosis not present

## 2024-02-11 DIAGNOSIS — E039 Hypothyroidism, unspecified: Secondary | ICD-10-CM | POA: Diagnosis present

## 2024-02-11 DIAGNOSIS — J154 Pneumonia due to other streptococci: Secondary | ICD-10-CM | POA: Diagnosis not present

## 2024-02-11 DIAGNOSIS — N1832 Chronic kidney disease, stage 3b: Secondary | ICD-10-CM | POA: Diagnosis not present

## 2024-02-11 DIAGNOSIS — D509 Iron deficiency anemia, unspecified: Secondary | ICD-10-CM | POA: Diagnosis present

## 2024-02-11 DIAGNOSIS — R0781 Pleurodynia: Secondary | ICD-10-CM | POA: Diagnosis not present

## 2024-02-11 DIAGNOSIS — J849 Interstitial pulmonary disease, unspecified: Secondary | ICD-10-CM | POA: Diagnosis present

## 2024-02-11 DIAGNOSIS — A419 Sepsis, unspecified organism: Secondary | ICD-10-CM | POA: Diagnosis not present

## 2024-02-11 DIAGNOSIS — Z79891 Long term (current) use of opiate analgesic: Secondary | ICD-10-CM

## 2024-02-11 DIAGNOSIS — Z885 Allergy status to narcotic agent status: Secondary | ICD-10-CM

## 2024-02-11 DIAGNOSIS — J9601 Acute respiratory failure with hypoxia: Secondary | ICD-10-CM | POA: Diagnosis not present

## 2024-02-11 DIAGNOSIS — J189 Pneumonia, unspecified organism: Secondary | ICD-10-CM | POA: Diagnosis not present

## 2024-02-11 DIAGNOSIS — Z79890 Hormone replacement therapy (postmenopausal): Secondary | ICD-10-CM

## 2024-02-11 DIAGNOSIS — J168 Pneumonia due to other specified infectious organisms: Secondary | ICD-10-CM | POA: Diagnosis not present

## 2024-02-11 DIAGNOSIS — I491 Atrial premature depolarization: Secondary | ICD-10-CM | POA: Diagnosis not present

## 2024-02-11 DIAGNOSIS — R6520 Severe sepsis without septic shock: Secondary | ICD-10-CM | POA: Diagnosis present

## 2024-02-11 DIAGNOSIS — D696 Thrombocytopenia, unspecified: Secondary | ICD-10-CM | POA: Diagnosis present

## 2024-02-11 DIAGNOSIS — R5381 Other malaise: Secondary | ICD-10-CM | POA: Diagnosis present

## 2024-02-11 DIAGNOSIS — J440 Chronic obstructive pulmonary disease with acute lower respiratory infection: Secondary | ICD-10-CM | POA: Diagnosis present

## 2024-02-11 DIAGNOSIS — I25810 Atherosclerosis of coronary artery bypass graft(s) without angina pectoris: Secondary | ICD-10-CM | POA: Diagnosis present

## 2024-02-11 DIAGNOSIS — E7800 Pure hypercholesterolemia, unspecified: Secondary | ICD-10-CM | POA: Diagnosis present

## 2024-02-11 DIAGNOSIS — I2510 Atherosclerotic heart disease of native coronary artery without angina pectoris: Secondary | ICD-10-CM | POA: Diagnosis present

## 2024-02-11 DIAGNOSIS — M47816 Spondylosis without myelopathy or radiculopathy, lumbar region: Secondary | ICD-10-CM | POA: Diagnosis present

## 2024-02-11 DIAGNOSIS — M542 Cervicalgia: Secondary | ICD-10-CM | POA: Diagnosis not present

## 2024-02-11 DIAGNOSIS — Z8249 Family history of ischemic heart disease and other diseases of the circulatory system: Secondary | ICD-10-CM

## 2024-02-11 DIAGNOSIS — G8929 Other chronic pain: Secondary | ICD-10-CM | POA: Diagnosis present

## 2024-02-11 DIAGNOSIS — Z1152 Encounter for screening for COVID-19: Secondary | ICD-10-CM | POA: Diagnosis not present

## 2024-02-11 DIAGNOSIS — Z79899 Other long term (current) drug therapy: Secondary | ICD-10-CM

## 2024-02-11 DIAGNOSIS — E876 Hypokalemia: Secondary | ICD-10-CM | POA: Diagnosis present

## 2024-02-11 DIAGNOSIS — Z7982 Long term (current) use of aspirin: Secondary | ICD-10-CM

## 2024-02-11 DIAGNOSIS — I130 Hypertensive heart and chronic kidney disease with heart failure and stage 1 through stage 4 chronic kidney disease, or unspecified chronic kidney disease: Secondary | ICD-10-CM | POA: Diagnosis present

## 2024-02-11 DIAGNOSIS — N179 Acute kidney failure, unspecified: Secondary | ICD-10-CM | POA: Diagnosis present

## 2024-02-11 DIAGNOSIS — Z8701 Personal history of pneumonia (recurrent): Secondary | ICD-10-CM

## 2024-02-11 DIAGNOSIS — A403 Sepsis due to Streptococcus pneumoniae: Secondary | ICD-10-CM | POA: Diagnosis present

## 2024-02-11 DIAGNOSIS — I252 Old myocardial infarction: Secondary | ICD-10-CM

## 2024-02-11 DIAGNOSIS — I272 Pulmonary hypertension, unspecified: Secondary | ICD-10-CM | POA: Diagnosis present

## 2024-02-11 DIAGNOSIS — Z604 Social exclusion and rejection: Secondary | ICD-10-CM | POA: Diagnosis present

## 2024-02-11 DIAGNOSIS — K219 Gastro-esophageal reflux disease without esophagitis: Secondary | ICD-10-CM | POA: Diagnosis present

## 2024-02-11 DIAGNOSIS — G9341 Metabolic encephalopathy: Secondary | ICD-10-CM | POA: Diagnosis present

## 2024-02-11 DIAGNOSIS — I5033 Acute on chronic diastolic (congestive) heart failure: Secondary | ICD-10-CM | POA: Diagnosis present

## 2024-02-11 DIAGNOSIS — I440 Atrioventricular block, first degree: Secondary | ICD-10-CM | POA: Diagnosis present

## 2024-02-11 DIAGNOSIS — R29890 Loss of height: Secondary | ICD-10-CM | POA: Diagnosis not present

## 2024-02-11 DIAGNOSIS — I447 Left bundle-branch block, unspecified: Secondary | ICD-10-CM | POA: Diagnosis present

## 2024-02-11 DIAGNOSIS — R0609 Other forms of dyspnea: Secondary | ICD-10-CM | POA: Diagnosis not present

## 2024-02-11 DIAGNOSIS — Z6827 Body mass index (BMI) 27.0-27.9, adult: Secondary | ICD-10-CM | POA: Diagnosis not present

## 2024-02-11 DIAGNOSIS — Z8619 Personal history of other infectious and parasitic diseases: Secondary | ICD-10-CM

## 2024-02-11 DIAGNOSIS — Z955 Presence of coronary angioplasty implant and graft: Secondary | ICD-10-CM

## 2024-02-11 DIAGNOSIS — Z90711 Acquired absence of uterus with remaining cervical stump: Secondary | ICD-10-CM

## 2024-02-11 DIAGNOSIS — Z87891 Personal history of nicotine dependence: Secondary | ICD-10-CM

## 2024-02-11 DIAGNOSIS — R410 Disorientation, unspecified: Secondary | ICD-10-CM | POA: Diagnosis present

## 2024-02-11 DIAGNOSIS — I4891 Unspecified atrial fibrillation: Secondary | ICD-10-CM | POA: Diagnosis present

## 2024-02-11 LAB — CBC WITH DIFFERENTIAL/PLATELET
Abs Immature Granulocytes: 0.04 10*3/uL (ref 0.00–0.07)
Basophils Absolute: 0 10*3/uL (ref 0.0–0.1)
Basophils Relative: 0 %
Eosinophils Absolute: 0.3 10*3/uL (ref 0.0–0.5)
Eosinophils Relative: 3 %
HCT: 33.6 % — ABNORMAL LOW (ref 36.0–46.0)
Hemoglobin: 11.2 g/dL — ABNORMAL LOW (ref 12.0–15.0)
Immature Granulocytes: 1 %
Lymphocytes Relative: 17 %
Lymphs Abs: 1.5 10*3/uL (ref 0.7–4.0)
MCH: 33.1 pg (ref 26.0–34.0)
MCHC: 33.3 g/dL (ref 30.0–36.0)
MCV: 99.4 fL (ref 80.0–100.0)
Monocytes Absolute: 0.6 10*3/uL (ref 0.1–1.0)
Monocytes Relative: 7 %
Neutro Abs: 6.2 10*3/uL (ref 1.7–7.7)
Neutrophils Relative %: 72 %
Platelets: 156 10*3/uL (ref 150–400)
RBC: 3.38 MIL/uL — ABNORMAL LOW (ref 3.87–5.11)
RDW: 12.8 % (ref 11.5–15.5)
WBC: 8.5 10*3/uL (ref 4.0–10.5)
nRBC: 0 % (ref 0.0–0.2)

## 2024-02-11 LAB — I-STAT ARTERIAL BLOOD GAS, ED
Acid-base deficit: 3 mmol/L — ABNORMAL HIGH (ref 0.0–2.0)
Bicarbonate: 21.9 mmol/L (ref 20.0–28.0)
Calcium, Ion: 1.33 mmol/L (ref 1.15–1.40)
HCT: 30 % — ABNORMAL LOW (ref 36.0–46.0)
Hemoglobin: 10.2 g/dL — ABNORMAL LOW (ref 12.0–15.0)
O2 Saturation: 98 %
Potassium: 3.6 mmol/L (ref 3.5–5.1)
Sodium: 140 mmol/L (ref 135–145)
TCO2: 23 mmol/L (ref 22–32)
pCO2 arterial: 38.1 mmHg (ref 32–48)
pH, Arterial: 7.369 (ref 7.35–7.45)
pO2, Arterial: 108 mmHg (ref 83–108)

## 2024-02-11 LAB — URINALYSIS, W/ REFLEX TO CULTURE (INFECTION SUSPECTED)
Bacteria, UA: NONE SEEN
Bilirubin Urine: NEGATIVE
Glucose, UA: NEGATIVE mg/dL
Hgb urine dipstick: NEGATIVE
Ketones, ur: NEGATIVE mg/dL
Leukocytes,Ua: NEGATIVE
Nitrite: NEGATIVE
Specific Gravity, Urine: 1.014 (ref 1.005–1.030)
pH: 6.5 (ref 5.0–8.0)

## 2024-02-11 LAB — COMPREHENSIVE METABOLIC PANEL WITH GFR
ALT: 11 U/L (ref 0–44)
AST: 27 U/L (ref 15–41)
Albumin: 4.5 g/dL (ref 3.5–5.0)
Alkaline Phosphatase: 79 U/L (ref 38–126)
Anion gap: 14 (ref 5–15)
BUN: 32 mg/dL — ABNORMAL HIGH (ref 8–23)
CO2: 25 mmol/L (ref 22–32)
Calcium: 10.6 mg/dL — ABNORMAL HIGH (ref 8.9–10.3)
Chloride: 101 mmol/L (ref 98–111)
Creatinine, Ser: 1.96 mg/dL — ABNORMAL HIGH (ref 0.44–1.00)
GFR, Estimated: 24 mL/min — ABNORMAL LOW (ref >60)
Glucose, Bld: 123 mg/dL — ABNORMAL HIGH (ref 70–99)
Potassium: 4.2 mmol/L (ref 3.5–5.1)
Sodium: 140 mmol/L (ref 135–145)
Total Bilirubin: 0.4 mg/dL (ref 0.0–1.2)
Total Protein: 7.1 g/dL (ref 6.5–8.1)

## 2024-02-11 LAB — PROTIME-INR
INR: 1.1 (ref 0.8–1.2)
Prothrombin Time: 14.2 s (ref 11.4–15.2)

## 2024-02-11 LAB — PRO BRAIN NATRIURETIC PEPTIDE: Pro Brain Natriuretic Peptide: 1860 pg/mL — ABNORMAL HIGH (ref <300.0)

## 2024-02-11 LAB — RESP PANEL BY RT-PCR (RSV, FLU A&B, COVID)  RVPGX2
Influenza A by PCR: NEGATIVE
Influenza B by PCR: NEGATIVE
Resp Syncytial Virus by PCR: NEGATIVE
SARS Coronavirus 2 by RT PCR: NEGATIVE

## 2024-02-11 LAB — LACTIC ACID, PLASMA
Lactic Acid, Venous: 1.8 mmol/L (ref 0.5–1.9)
Lactic Acid, Venous: 1.6 mmol/L (ref 0.5–1.9)

## 2024-02-11 MED ORDER — METRONIDAZOLE 500 MG/100ML IV SOLN
500.0000 mg | Freq: Once | INTRAVENOUS | Status: AC
  Administered 2024-02-11: 500 mg via INTRAVENOUS
  Filled 2024-02-11: qty 100

## 2024-02-11 MED ORDER — LACTATED RINGERS IV BOLUS (SEPSIS): 1000.0000 mL | Freq: Once | INTRAVENOUS | Status: DC

## 2024-02-11 MED ORDER — SODIUM CHLORIDE 0.9 % IV SOLN
2.0000 g | Freq: Once | INTRAVENOUS | Status: AC
  Administered 2024-02-11: 2 g via INTRAVENOUS
  Filled 2024-02-11: qty 12.5

## 2024-02-11 MED ORDER — ACETAMINOPHEN 650 MG RE SUPP
650.0000 mg | Freq: Once | RECTAL | Status: AC
Start: 2024-02-11 — End: 2024-02-11
  Administered 2024-02-11: 650 mg via RECTAL
  Filled 2024-02-11: qty 1

## 2024-02-11 MED ORDER — SODIUM CHLORIDE 0.9 % IV BOLUS
1000.0000 mL | Freq: Once | INTRAVENOUS | Status: AC
  Administered 2024-02-11: 1000 mL via INTRAVENOUS

## 2024-02-11 MED ORDER — LACTATED RINGERS IV SOLN: INTRAVENOUS | Status: DC

## 2024-02-11 MED ORDER — FUROSEMIDE 10 MG/ML IJ SOLN
20.0000 mg | Freq: Once | INTRAMUSCULAR | Status: AC
Start: 2024-02-11 — End: 2024-02-11
  Administered 2024-02-11: 20 mg via INTRAVENOUS
  Filled 2024-02-11: qty 2

## 2024-02-11 MED ORDER — ACETAMINOPHEN 500 MG PO TABS
1000.0000 mg | ORAL_TABLET | Freq: Once | ORAL | Status: DC
  Filled 2024-02-11: qty 2

## 2024-02-11 MED ORDER — HYDROCORTISONE SOD SUC (PF) 100 MG IJ SOLR
100.0000 mg | Freq: Once | INTRAMUSCULAR | Status: AC
  Administered 2024-02-11: 100 mg via INTRAVENOUS
  Filled 2024-02-11: qty 2

## 2024-02-11 MED ORDER — LACTATED RINGERS IV BOLUS (SEPSIS): 250.0000 mL | Freq: Once | INTRAVENOUS | Status: DC

## 2024-02-11 MED ORDER — ALBUTEROL SULFATE (2.5 MG/3ML) 0.083% IN NEBU
INHALATION_SOLUTION | RESPIRATORY_TRACT | Status: AC
Start: 2024-02-11 — End: 2024-02-11
  Administered 2024-02-11: 2.5 mg
  Filled 2024-02-11: qty 3

## 2024-02-11 MED ORDER — LACTATED RINGERS IV BOLUS (SEPSIS)
1000.0000 mL | Freq: Once | INTRAVENOUS | Status: AC
  Administered 2024-02-11: 1000 mL via INTRAVENOUS

## 2024-02-11 MED ORDER — VANCOMYCIN HCL IN DEXTROSE 1-5 GM/200ML-% IV SOLN
1000.0000 mg | Freq: Once | INTRAVENOUS | Status: AC
  Administered 2024-02-11: 1000 mg via INTRAVENOUS
  Filled 2024-02-11: qty 200

## 2024-02-11 MED ORDER — IPRATROPIUM-ALBUTEROL 0.5-2.5 (3) MG/3ML IN SOLN
RESPIRATORY_TRACT | Status: AC
  Administered 2024-02-11: 3 mL
  Filled 2024-02-11: qty 3

## 2024-02-11 NOTE — ED Provider Notes (Addendum)
 [place] EMERGENCY DEPARTMENT AT [facility] Provider Note  CSN: [id number] Arrival date & time: 02/11/24 1614  Chief Complaint(s) Altered Mental Status and Urinary Tract Infection  HPI Cindy [name] is a 89 y.o. female who is here today for confusion that occurred approximately 2 hours prior to arrival.  Patient is here with her husband helps provide history.  Husband reports that the patient seemed to be a bit confused, started having rigors after they were out to dinner.  He says that patient has had this occur previously, and has been UTIs and also pneumonia.  Patient was admitted for pneumonia in January of this past year.  I reviewed the hospitalist admission and discharge summaries.   Past Medical History Past Medical History:  Diagnosis Date   Asthma    Bronchitis    COPD (chronic obstructive pulmonary disease) (HCC)    Coronary artery disease    MI age 57   GERD (gastroesophageal reflux disease)    Heart attack (HCC)    Hypercholesteremia    Hypertension    ILD (interstitial lung disease) (HCC)    Iron deficiency anemia    used to see hem in [place], [place].    Renal insufficiency    Thyroid  disease    Patient Active Problem List   Diagnosis Date Noted   Pneumonia 10/27/2023   Acute respiratory failure with hypoxia (HCC) 10/19/2023   Hypomagnesemia 10/19/2023   Hypercalcemia 10/19/2023   Lactic acidosis 10/19/2023   Influenza A 10/19/2023   Bronchiectasis with acute exacerbation 03/12/2023   Myocardial injury 03/12/2023   Acute metabolic encephalopathy 03/11/2023   Possible acute cystitis 10/08/2022   Dehydration 10/08/2022   Acute renal failure superimposed on stage 3b chronic kidney disease (HCC) 10/08/2022   Acute prerenal azotemia 10/08/2022   Fever 10/08/2022   Chronic diastolic CHF (congestive heart failure) (HCC) 10/08/2022   Lumbar spondylosis 03/08/2021   Abdominal aortic atherosclerosis (HCC) 02/14/2021   Essential hypertension 12/25/2018    Carotid artery disease (HCC) 12/25/2018   Chest pain of uncertain etiology    Unstable angina (HCC) 11/26/2018   Thrombocytopenia (HCC) 01/06/2017   Lower urinary tract infectious disease 01/06/2017   Acquired hypothyroidism 01/06/2017   Anemia 01/06/2017   Umbilical hernia 02/04/2014   Coronary atherosclerosis of native coronary artery 12/11/2013   Old MI (myocardial infarction) 12/11/2013   History of tobacco use 12/11/2013   Angina decubitus (HCC) 12/11/2013   HLD (hyperlipidemia) 12/11/2013   Allergic rhinitis 09/16/2012   Interstitial lung disease (HCC) 05/08/2012   Pulmonary nodule, right 05/06/2012   Cough 03/20/2012   Chronic iron deficiency anemia    Chronic kidney disease, stage 3b (HCC)    Home Medication(s) Prior to Admission medications   Medication Sig Start Date End Date Taking? Authorizing Provider  albuterol  (VENTOLIN  HFA) 108 (90 Base) MCG/ACT inhaler Inhale 1 puff into the lungs every 4 (four) hours as needed for wheezing or shortness of breath. Patient taking differently: Inhale 2 puffs into the lungs 2 (two) times daily as needed for wheezing or shortness of breath. 03/14/23   Vada Garibaldi, MD  aspirin  81 MG tablet Take 81 mg by mouth daily.     [provider]  atorvastatin  (LIPITOR ) 80 MG tablet Take 1 tablet (80 mg total) by mouth daily. 12/25/18   Hammond, Janine, NP  benzonatate  (TESSALON ) 200 MG capsule Take 1 capsule by mouth twice daily as needed for cough Patient taking differently: Take 200 mg by mouth 2 (two) times daily as needed  for cough. 09/23/20   Lind Repine, MD  Calcium  Carbonate-Vitamin D 600-200 MG-UNIT TABS Take 1 tablet by mouth 2 (two) times daily.    [provider]  ferrous sulfate  325 (65 FE) MG tablet Take 325 mg by mouth daily with breakfast.    [provider]  fexofenadine  (ALLEGRA ) 180 MG tablet Take 1 tablet (180 mg total) by mouth daily. 01/28/23   Lind Repine, MD  furosemide  (LASIX ) 20 MG tablet Take  one tablet (20 mg) daily x 5 days 11/21/23   Lind Repine, MD  guaifenesin  (HUMIBID E) 400 MG TABS tablet Take 400 mg by mouth in the morning and at bedtime. 07/02/23   [provider]  ipratropium (ATROVENT ) 0.06 % nasal spray Place 2 sprays into both nostrils 3 (three) times daily as needed for rhinitis.    [provider]  isosorbide  mononitrate (IMDUR ) 30 MG 24 hr tablet Take 90 mg by mouth daily. 11/13/21   [provider]  levothyroxine  (SYNTHROID , LEVOTHROID) 175 MCG tablet Take 175 mcg by mouth every evening.    [provider]  losartan  (COZAAR ) 50 MG tablet Take 50 mg by mouth daily.    [provider]  metoprolol  succinate (TOPROL -XL) 25 MG 24 hr tablet Take 1 tablet (25 mg total) by mouth daily. 02/14/23   Hugh Madura, MD  montelukast  (SINGULAIR ) 10 MG tablet Take 10 mg by mouth daily. 08/31/20   [provider]  Multiple Vitamins-Minerals (ICAPS AREDS 2 PO) Take 1 capsule by mouth in the morning and at bedtime.    [provider]  Niacinamide-Zn-Cu-Methfo-Se-Cr (NICOTINAMIDE PO) Take 1 capsule by mouth 2 (two) times daily.    [provider]  nitroGLYCERIN  (NITROSTAT ) 0.4 MG SL tablet PLACE 1 TABLET UNDER THE TONGUE EVERY 5 MINUTES AS NEEDED FOR CHEST PAIN Patient taking differently: Place 0.4 mg under the tongue every 5 (five) minutes as needed for chest pain. 09/11/19   Hugh Madura, MD  pantoprazole  (PROTONIX ) 40 MG tablet Take 40 mg by mouth 2 (two) times daily.    [provider]  spironolactone -hydrochlorothiazide  (ALDACTAZIDE) 25-25 MG tablet Take 1 tablet by mouth daily.    [provider]  TART CHERRY PO Take 1 tablet by mouth 2 (two) times a week.    [provider]  traMADol  (ULTRAM ) 50 MG tablet Take 50 mg by mouth 2 (two) times daily.    [provider]                                                                                                                                     Past Surgical History Past Surgical History:  Procedure Laterality Date   APPENDECTOMY     BREAST BIOPSY Right    benign   CORONARY ANGIOPLASTY WITH STENT PLACEMENT     CORONARY ARTERY BYPASS GRAFT  09/1979   LEFT HEART CATH AND  CORS/GRAFTS ANGIOGRAPHY N/A 11/27/2018   Procedure: LEFT HEART CATH AND CORS/GRAFTS ANGIOGRAPHY;  Surgeon: Avanell Leigh, MD;  Location: [facility] INVASIVE CV LAB;  Service: Cardiovascular;  Laterality: N/A;   Moh's Surgeries     for squamous cell carcinoma   PARTIAL HYSTERECTOMY  10/1978   Family History Family History  Problem Relation Age of Onset   Heart disease Mother    Heart disease Father    Melanoma Sister        x2   Breast cancer Neg Hx     Social History Social History   Tobacco Use   Smoking status: Former    Current packs/day: 0.00    Average packs/day: 1.5 packs/day for 22.0 years (33.0 ttl pk-yrs)    Types: Cigarettes    Start date: 10/22/1957    Quit date: 10/23/1979    Years since quitting: 44.3   Smokeless tobacco: Never  Vaping Use   Vaping status: Never Used  Substance Use Topics   Alcohol  use: Yes    Comment: occasional   Drug use: No   Allergies Codeine  Review of Systems Review of Systems  Physical Exam Vital Signs  I have reviewed the triage vital signs BP (!) 148/108   Pulse (!) 130   Temp (!) 101.8 F (38.8 C) (Oral)   Resp (!) 37   LMP  (LMP Unknown)   SpO2 99%   Physical Exam Vitals reviewed.  Constitutional:      Appearance: She is toxic-appearing.  HENT:     Mouth/Throat:     Mouth: Mucous membranes are moist.  Eyes:     Pupils: Pupils are equal, round, and reactive to light.  Cardiovascular:     Rate and Rhythm: Tachycardia present.     Pulses: Normal pulses.  Pulmonary:     Effort: Respiratory distress present.     Breath sounds: Rales present.     Comments: Cough crackles Musculoskeletal:     Cervical back: Normal range of motion. No rigidity.  Neurological:     Mental  Status: She is alert.     ED Results and Treatments Labs (all labs ordered are listed, but only abnormal results are displayed) Labs Reviewed  CBC WITH DIFFERENTIAL/PLATELET - Abnormal; Notable for the following components:      Result Value   RBC 3.38 (*)    Hemoglobin 11.2 (*)    HCT 33.6 (*)    All other components within normal limits  COMPREHENSIVE METABOLIC PANEL WITH GFR - Abnormal; Notable for the following components:   Glucose, Bld 123 (*)    BUN 32 (*)    Creatinine, Ser 1.96 (*)    Calcium  10.6 (*)    GFR, Estimated 24 (*)    All other components within normal limits  URINALYSIS, W/ REFLEX TO CULTURE (INFECTION SUSPECTED) - Abnormal; Notable for the following components:   Protein, ur TRACE (*)    All other components within normal limits  RESP PANEL BY RT-PCR (RSV, FLU A&B, COVID)  RVPGX2  CULTURE, BLOOD (ROUTINE X 2)  CULTURE, BLOOD (ROUTINE X 2)  LACTIC ACID, PLASMA  PROTIME-INR  LACTIC ACID, PLASMA  PRO BRAIN NATRIURETIC PEPTIDE  Radiology DG Chest 1 View Result Date: 02/11/2024 CLINICAL DATA:  Confusion UTI EXAM: CHEST  1 VIEW COMPARISON:  02/11/2024, 10/27/2023, CT chest 10/19/2023, chest x-ray 03/11/2023 FINDINGS: Sternotomy. Chronic interstitial opacities, but with slight increased diffuse interstitial and ground-glass opacity similar compared to radiograph earlier this evening. Stable cardiomediastinal silhouette with aortic atherosclerosis. No pneumothorax. Possible trace effusion IMPRESSION: Chronic interstitial opacities with slight increased diffuse interstitial and ground-glass opacity similar compared to radiograph earlier this evening, possible mild edema superimposed on underlying chronic change. Possible trace effusions. Electronically Signed   By: Esmeralda Hedge M.D.   On: 02/11/2024 19:43   DG Chest Portable 1 View Result Date:  02/11/2024 CLINICAL DATA:  Cough. EXAM: PORTABLE CHEST 1 VIEW COMPARISON:  October 27, 2023. FINDINGS: Stable cardiomediastinal silhouette. Sternotomy wires are noted. Mild central pulmonary vascular congestion is noted. No consolidative process is noted. Bony thorax is unremarkable. IMPRESSION: Mild central pulmonary vascular congestion. Electronically Signed   By: Rosalene Colon M.D.   On: 02/11/2024 17:34   CT ABDOMEN PELVIS WO CONTRAST Result Date: 02/11/2024 CLINICAL DATA:  Acute abdominal pain.  UTI. EXAM: CT ABDOMEN AND PELVIS WITHOUT CONTRAST TECHNIQUE: Multidetector CT imaging of the abdomen and pelvis was performed following the standard protocol without IV contrast. RADIATION DOSE REDUCTION: This exam was performed according to the departmental dose-optimization program which includes automated exposure control, adjustment of the mA and/or kV according to patient size and/or use of iterative reconstruction technique. COMPARISON:  CT chest abdomen and pelvis 03/11/2023. FINDINGS: Lower chest: There is patchy airspace disease in the right lower lobe. Hepatobiliary: No focal liver abnormality is seen. No gallstones, gallbladder wall thickening, or biliary dilatation. Pancreas: Unremarkable. No pancreatic ductal dilatation or surrounding inflammatory changes. Spleen: Normal in size without focal abnormality. Adrenals/Urinary Tract: Small posterior right bladder diverticulum is again seen. The bladder is otherwise within normal limits. The kidneys and adrenal glands are within normal limits. Stomach/Bowel: The stomach is distended with large air-fluid level. Small bowel and colon are nondilated. The appendix is not seen. No focal wall thickening or inflammation identified. There is a large amount of stool in the rectum. Vascular/Lymphatic: Aortic atherosclerosis. No enlarged abdominal or pelvic lymph nodes. Reproductive: Choose chilled Other: No abdominal wall hernia or abnormality. No abdominopelvic  ascites. There is a surgical clip in the left inguinal region. Musculoskeletal: the bones are osteopenic. Degenerative changes affect the spine. There is grade 1 anterolisthesis at L4-L5 which is unchanged. IMPRESSION: 1. No acute localizing process in the abdomen or pelvis. 2. Large amount of stool in the rectum. 3. Patchy airspace disease in the right lower lobe worrisome for pneumonia. 4. Aortic atherosclerosis. Aortic Atherosclerosis (ICD10-I70.0). Electronically Signed   By: Tyron Gallon M.D.   On: 02/11/2024 17:33    Pertinent labs & imaging results that were available during my care of the patient were reviewed by me and considered in my medical decision making (see MDM for details).  Medications Ordered in ED Medications  lactated ringers  infusion (has no administration in time range)  lactated ringers  bolus 1,000 mL (0 mLs Intravenous Stopped 02/11/24 1826)    And  lactated ringers  bolus 1,000 mL (has no administration in time range)    And  lactated ringers  bolus 250 mL (has no administration in time range)  vancomycin  (VANCOCIN ) IVPB 1000 mg/200 mL premix (has no administration in time range)  hydrocortisone  sodium succinate  (SOLU-CORTEF ) 100 MG injection 100 mg (has no administration in time range)  acetaminophen  (TYLENOL ) tablet 1,000 mg (  has no administration in time range)  sodium chloride  0.9 % bolus 1,000 mL ( Intravenous Stopped 02/11/24 1810)  ceFEPIme  (MAXIPIME ) 2 g in sodium chloride  0.9 % 100 mL IVPB (0 g Intravenous Stopped 02/11/24 1758)  metroNIDAZOLE  (FLAGYL ) IVPB 500 mg (0 mg Intravenous Stopped 02/11/24 1724)  ipratropium-albuterol  (DUONEB) 0.5-2.5 (3) MG/3ML nebulizer solution (3 mLs  Given 02/11/24 1810)  albuterol  (PROVENTIL ) (2.5 MG/3ML) 0.083% nebulizer solution (2.5 mg  Given 02/11/24 1810)                                                                                                                                     Procedures .Critical Care  Performed by:  Nathanael Baker, DO Authorized by: Nathanael Baker, DO   Critical care provider statement:    Critical care time (minutes):  88   Critical care was necessary to treat or prevent imminent or life-threatening deterioration of the following conditions:  Sepsis   Critical care was time spent personally by me on the following activities:  Development of treatment plan with patient or surrogate, discussions with consultants, evaluation of patient's response to treatment, examination of patient, ordering and review of laboratory studies, ordering and review of radiographic studies, ordering and performing treatments and interventions, pulse oximetry, re-evaluation of patient's condition and review of old charts   (including critical care time)  Medical Decision Making / ED Course   This patient presents to the ED for concern of altered mental status and fever, this involves an extensive number of treatment options, and is a complaint that carries with it a high risk of complications and morbidity.  The differential diagnosis includes sepsis, pneumonia, intra-abdominal infection, UTI, less likely meningitis.  MDM: Upon arrival to the emergency room, patient does meet sepsis criteria.  Patient febrile, altered, tachycardic and tachypneic.  Patient with a cough.  Suspicion was for pneumonia as a likely source.  Patient was also endorsing some abdominal pain, however the patient had all over body pain.  Obtain CT image of the patient's abdomen which was negative for intra-abdominal process.  Chest x-ray and CT imaging together are consistent with pneumonia, the patient's cough, believe is likely diagnosis.  Patient's lactic acid not elevated, no leukocytosis.  Patient received a 1 L fluid bolus, developed increased dyspnea, Rales.  Placed patient on BiPAP and obtained a repeat chest x-ray which did show an increase in pulmonary edema.  Patient unable to tolerate additional IV fluids.  I reevaluated the  patient after she received 1 L fluid bolus.  Respiratory therapist provided the patient with albuterol  and they had increased difficulty with her breathing the patient briefly had sustained tachycardia.  Her EKG shows a left bundle branch block.  Patient's heart rate is come down nicely after the albuterol  is gotten out of her system.  She remains normotensive.  She has received IV antibiotics.  With her having a pneumonia requiring respiration assistance,  I provided the patient with hydrocortisone .  Will admit patient to hospitalist for pneumonia, sepsis with endorgan dysfunction in the form of acute kidney injury.  Reassessment 9:30 PM-switch patient from BiPAP to high flow nasal cannula due to patient comfort.  Gave her some rectal Tylenol .  Patient's repeat chest x-ray did show some pulmonary edema, and although I am reluctant to do so, provided her with Lasix .  Her respiratory status is currently more tenuous than her need for IV fluids in this particular instance.  Intubation in this patient would be devastating, so prioritizing ventilation is top priority at this time.  She has done well since receiving this medication, is doing well on high flow nasal cannula.  Patient now appropriate for stepdown unit.   Additional history obtained: -Additional history obtained from family at bedside -External records from outside source obtained and reviewed including: Chart review including previous notes, labs, imaging, consultation notes   Lab Tests: -I ordered, reviewed, and interpreted labs.   The pertinent results include:   Labs Reviewed  CBC WITH DIFFERENTIAL/PLATELET - Abnormal; Notable for the following components:      Result Value   RBC 3.38 (*)    Hemoglobin 11.2 (*)    HCT 33.6 (*)    All other components within normal limits  COMPREHENSIVE METABOLIC PANEL WITH GFR - Abnormal; Notable for the following components:   Glucose, Bld 123 (*)    BUN 32 (*)    Creatinine, Ser 1.96 (*)     Calcium  10.6 (*)    GFR, Estimated 24 (*)    All other components within normal limits  URINALYSIS, W/ REFLEX TO CULTURE (INFECTION SUSPECTED) - Abnormal; Notable for the following components:   Protein, ur TRACE (*)    All other components within normal limits  RESP PANEL BY RT-PCR (RSV, FLU A&B, COVID)  RVPGX2  CULTURE, BLOOD (ROUTINE X 2)  CULTURE, BLOOD (ROUTINE X 2)  LACTIC ACID, PLASMA  PROTIME-INR  LACTIC ACID, PLASMA  PRO BRAIN NATRIURETIC PEPTIDE      EKG sinus tachycardia, no acute ischemia  EKG Interpretation Date/Time:    Ventricular Rate:    PR Interval:    QRS Duration:    QT Interval:    QTC Calculation:   R Axis:      Text Interpretation:           Imaging Studies ordered: I ordered imaging studies including chest x-ray, CT imaging of the abdomen pelvis I independently visualized and interpreted imaging. I agree with the radiologist interpretation   Medicines ordered and prescription drug management: Meds ordered this encounter  Medications   sodium chloride  0.9 % bolus 1,000 mL   lactated ringers  infusion   AND Linked Order Group    lactated ringers  bolus 1,000 mL     Enter Patient Weight in Kilograms:   67    lactated ringers  bolus 1,000 mL     Enter Patient Weight in Kilograms:   67    lactated ringers  bolus 250 mL     Enter Patient Weight in Kilograms:   67   ceFEPIme  (MAXIPIME ) 2 g in sodium chloride  0.9 % 100 mL IVPB    Antibiotic Indication::   Other Indication (list below)    Other Indication::   Unknown Source.   metroNIDAZOLE  (FLAGYL ) IVPB 500 mg    Antibiotic Indication::   Other Indication (list below)    Other Indication::   Unknown Source.   vancomycin  (VANCOCIN ) IVPB 1000 mg/200 mL premix  Indication::   Other Indication (list below)    Other Indication::   Unknown Source.   ipratropium-albuterol  (DUONEB) 0.5-2.5 (3) MG/3ML nebulizer solution    Powell, Robin F: cabinet override   albuterol  (PROVENTIL ) (2.5 MG/3ML)  0.083% nebulizer solution    Maryan Smalling F: cabinet override   hydrocortisone  sodium succinate  (SOLU-CORTEF ) 100 MG injection 100 mg    IV hydrocortisone  will be converted to either a q8h or q12h frequency with the same total daily dose (TDD).  Ordered Dose: 1 to 200 mg TDD; convert to: TDD div q12h.  Ordered Dose: 201 to 300 mg TDD; convert to: TDD div q8h.  Ordered Dose: >300 mg TDD; DAW.   acetaminophen  (TYLENOL ) tablet 1,000 mg    -I have reviewed the patients home medicines and have made adjustments as needed  Critical interventions Management of sepsis with endorgan dysfunction  Consultations Obtained: I requested consultation with the hospitalist,  and discussed lab and imaging findings as well as pertinent plan - they recommend: Admission   Cardiac Monitoring: The patient was maintained on a cardiac monitor.  I personally viewed and interpreted the cardiac monitored which showed an underlying rhythm of: Sinus tachycardia  Social Determinants of Health:  Factors impacting patients care include: Advanced age, multiple medical comorbidities including   Reevaluation: After the interventions noted above, I reevaluated the patient and found that they have :improved  Co morbidities that complicate the patient evaluation  Past Medical History:  Diagnosis Date   Asthma    Bronchitis    COPD (chronic obstructive pulmonary disease) (HCC)    Coronary artery disease    MI age 83   GERD (gastroesophageal reflux disease)    Heart attack (HCC)    Hypercholesteremia    Hypertension    ILD (interstitial lung disease) (HCC)    Iron deficiency anemia    used to see hem in [place], [place].    Renal insufficiency    Thyroid  disease       Dispostion: Admission     Final Clinical Impression(s) / ED Diagnoses Final diagnoses:  Pneumonia of right lower lobe due to infectious organism  Sepsis, due to unspecified organism, unspecified whether acute organ dysfunction present [facility])      @PCDICTATION @    Afton Horse T, DO 02/11/24 1957    Afton Horse T, DO 02/11/24 2146

## 2024-02-11 NOTE — ED Notes (Signed)
 Orders received from EDP for Heated High Flow [place]. Patient to be removed from Bipap and placed on HHFNC following [place] placement.

## 2024-02-11 NOTE — ED Triage Notes (Signed)
 Confusion and UTI symptoms per husband Cough Notice 1hr ago

## 2024-02-11 NOTE — Progress Notes (Signed)
 Hospitalist Transfer Note:    Nursing staff, Please call [facility] Admits & Consults System-Wide number on Amion [phone]) as soon as patient's arrival, so appropriate admitting provider can evaluate the pt.   Transferring facility: [facility] Requesting provider: Dr. Florie Husband (EDP at [facility] Reason for transfer: admission for further evaluation and management of acute hypoxic respiratory failure in the setting of severe sepsis due to suspected community-acquired pneumonia.     89  year old F with medical history notable for hospitalization for aspiration pneumonia in January 2025, who presented to [facility] ED complaining of new onset chills, rigors, over the course of the last day.  She is also noted to be mildly confused relative to baseline mental status upon arriving at [facility].  She has no known baseline supplemental oxygen requirements.  Vital signs in the ED were notable for the following: Temperature max 101.8; tachycardic; normotensive throughout her course at [facility], RR 18 - 30; oxygen saturation 83% on RA, she was started on BiPAP and was given a small IV fluid bolus, following which her respiratory status worsened, raising concern for potential secondary contribution from volume overload.  She received broad-spectrum IV antibiotics, a dose of IV Lasix , and was subsequent transition to from BiPAP to heated high flow nasal cannula.   Labs were notable for lactic acid 1.8 followed by 1.6.  Urinalysis inconsistent with UTI.   Imaging notable for CT scanning that showed patchy airspace opacity in the right lower lobe, concerning for pneumonia.  Blood cultures x 2 were collected prior to initiation of IV vancomycin , cefepime , Flagyl .   I have confirmed with the EDP that the patient's most recent settings on heat high flow [place] (not salter) are as follows: 30 L/min with 40% FiO2, upon which pt is maintaining oxygen saturations of 100%.  Subsequently, I accepted this patient for transfer for inpatient  admission to a SDU bed at [facility] or [facility]  (first available) for further work-up and management of the above.       Camelia Cavalier, DO Hospitalist

## 2024-02-11 NOTE — Sepsis Progress Note (Signed)
 Code sepsis protocol being monitored by eLink.

## 2024-02-11 NOTE — ED Notes (Signed)
 Patient placed on HHFNC at this time with settings of 40%/30L. SpO2 100%. Patient tolerating well. EDP at bedside.

## 2024-02-12 DIAGNOSIS — N179 Acute kidney failure, unspecified: Secondary | ICD-10-CM | POA: Diagnosis present

## 2024-02-12 DIAGNOSIS — J440 Chronic obstructive pulmonary disease with acute lower respiratory infection: Secondary | ICD-10-CM | POA: Diagnosis present

## 2024-02-12 DIAGNOSIS — E039 Hypothyroidism, unspecified: Secondary | ICD-10-CM | POA: Diagnosis present

## 2024-02-12 DIAGNOSIS — E876 Hypokalemia: Secondary | ICD-10-CM | POA: Diagnosis present

## 2024-02-12 DIAGNOSIS — E7800 Pure hypercholesterolemia, unspecified: Secondary | ICD-10-CM | POA: Diagnosis present

## 2024-02-12 DIAGNOSIS — I491 Atrial premature depolarization: Secondary | ICD-10-CM | POA: Diagnosis not present

## 2024-02-12 DIAGNOSIS — J189 Pneumonia, unspecified organism: Secondary | ICD-10-CM

## 2024-02-12 DIAGNOSIS — J9601 Acute respiratory failure with hypoxia: Secondary | ICD-10-CM | POA: Diagnosis present

## 2024-02-12 DIAGNOSIS — I272 Pulmonary hypertension, unspecified: Secondary | ICD-10-CM | POA: Diagnosis present

## 2024-02-12 DIAGNOSIS — G9341 Metabolic encephalopathy: Secondary | ICD-10-CM | POA: Diagnosis present

## 2024-02-12 DIAGNOSIS — R410 Disorientation, unspecified: Secondary | ICD-10-CM | POA: Diagnosis present

## 2024-02-12 DIAGNOSIS — A419 Sepsis, unspecified organism: Secondary | ICD-10-CM | POA: Diagnosis not present

## 2024-02-12 DIAGNOSIS — R0609 Other forms of dyspnea: Secondary | ICD-10-CM | POA: Diagnosis not present

## 2024-02-12 DIAGNOSIS — D509 Iron deficiency anemia, unspecified: Secondary | ICD-10-CM | POA: Diagnosis present

## 2024-02-12 DIAGNOSIS — N1832 Chronic kidney disease, stage 3b: Secondary | ICD-10-CM

## 2024-02-12 DIAGNOSIS — I5033 Acute on chronic diastolic (congestive) heart failure: Secondary | ICD-10-CM | POA: Diagnosis present

## 2024-02-12 DIAGNOSIS — I130 Hypertensive heart and chronic kidney disease with heart failure and stage 1 through stage 4 chronic kidney disease, or unspecified chronic kidney disease: Secondary | ICD-10-CM | POA: Diagnosis present

## 2024-02-12 DIAGNOSIS — R7881 Bacteremia: Secondary | ICD-10-CM | POA: Diagnosis not present

## 2024-02-12 DIAGNOSIS — R6520 Severe sepsis without septic shock: Secondary | ICD-10-CM | POA: Diagnosis present

## 2024-02-12 DIAGNOSIS — Z1152 Encounter for screening for COVID-19: Secondary | ICD-10-CM | POA: Diagnosis not present

## 2024-02-12 DIAGNOSIS — Z79890 Hormone replacement therapy (postmenopausal): Secondary | ICD-10-CM | POA: Diagnosis not present

## 2024-02-12 DIAGNOSIS — D696 Thrombocytopenia, unspecified: Secondary | ICD-10-CM | POA: Diagnosis present

## 2024-02-12 DIAGNOSIS — J849 Interstitial pulmonary disease, unspecified: Secondary | ICD-10-CM | POA: Diagnosis present

## 2024-02-12 DIAGNOSIS — I25810 Atherosclerosis of coronary artery bypass graft(s) without angina pectoris: Secondary | ICD-10-CM | POA: Diagnosis present

## 2024-02-12 DIAGNOSIS — G8929 Other chronic pain: Secondary | ICD-10-CM | POA: Diagnosis present

## 2024-02-12 DIAGNOSIS — J154 Pneumonia due to other streptococci: Secondary | ICD-10-CM | POA: Diagnosis present

## 2024-02-12 DIAGNOSIS — A403 Sepsis due to Streptococcus pneumoniae: Secondary | ICD-10-CM | POA: Diagnosis present

## 2024-02-12 DIAGNOSIS — I4891 Unspecified atrial fibrillation: Secondary | ICD-10-CM | POA: Diagnosis present

## 2024-02-12 LAB — BLOOD CULTURE ID PANEL (REFLEXED) - BCID2

## 2024-02-12 LAB — PHOSPHORUS: Phosphorus: 1.7 mg/dL — ABNORMAL LOW (ref 2.5–4.6)

## 2024-02-12 LAB — BASIC METABOLIC PANEL WITH GFR
Anion gap: 10 (ref 5–15)
BUN: 35 mg/dL — ABNORMAL HIGH (ref 8–23)
CO2: 23 mmol/L (ref 22–32)
Calcium: 9.2 mg/dL (ref 8.9–10.3)
Chloride: 105 mmol/L (ref 98–111)
Creatinine, Ser: 1.93 mg/dL — ABNORMAL HIGH (ref 0.44–1.00)
GFR, Estimated: 24 mL/min — ABNORMAL LOW (ref >60)
Glucose, Bld: 191 mg/dL — ABNORMAL HIGH (ref 70–99)
Potassium: 3.4 mmol/L — ABNORMAL LOW (ref 3.5–5.1)
Sodium: 138 mmol/L (ref 135–145)

## 2024-02-12 LAB — MAGNESIUM: Magnesium: 1.3 mg/dL — ABNORMAL LOW (ref 1.7–2.4)

## 2024-02-12 LAB — BRAIN NATRIURETIC PEPTIDE: B Natriuretic Peptide: 1569.3 pg/mL — ABNORMAL HIGH (ref 0.0–100.0)

## 2024-02-12 LAB — PROCALCITONIN: Procalcitonin: 8.87 ng/mL

## 2024-02-12 LAB — MRSA NEXT GEN BY PCR, NASAL: MRSA by PCR Next Gen: NOT DETECTED

## 2024-02-12 MED ORDER — GUAIFENESIN-DM 100-10 MG/5ML PO SYRP: 5.0000 mL | ORAL_SOLUTION | ORAL | Status: DC | PRN

## 2024-02-12 MED ORDER — ALBUMIN HUMAN 25 % IV SOLN
25.0000 g | INTRAVENOUS | Status: AC
  Administered 2024-02-12: 25 g via INTRAVENOUS
  Filled 2024-02-12: qty 100

## 2024-02-12 MED ORDER — ONDANSETRON HCL 4 MG/2ML IJ SOLN: 4.0000 mg | Freq: Four times a day (QID) | INTRAMUSCULAR | Status: DC | PRN

## 2024-02-12 MED ORDER — AZITHROMYCIN 250 MG PO TABS
500.0000 mg | ORAL_TABLET | Freq: Every day | ORAL | Status: DC
  Administered 2024-02-12: 500 mg via ORAL
  Filled 2024-02-12: qty 2

## 2024-02-12 MED ORDER — ACETAMINOPHEN 325 MG PO TABS
650.0000 mg | ORAL_TABLET | Freq: Four times a day (QID) | ORAL | Status: DC | PRN
  Administered 2024-02-13: 650 mg via ORAL
  Filled 2024-02-12: qty 2

## 2024-02-12 MED ORDER — GUAIFENESIN 200 MG PO TABS
400.0000 mg | ORAL_TABLET | Freq: Two times a day (BID) | ORAL | Status: DC
  Administered 2024-02-12 – 2024-02-16 (×9): 400 mg via ORAL
  Filled 2024-02-12 (×9): qty 2

## 2024-02-12 MED ORDER — ORAL CARE MOUTH RINSE: 15.0000 mL | OROMUCOSAL | Status: DC | PRN

## 2024-02-12 MED ORDER — ENOXAPARIN SODIUM 30 MG/0.3ML IJ SOSY
30.0000 mg | PREFILLED_SYRINGE | INTRAMUSCULAR | Status: DC
  Administered 2024-02-12: 30 mg via SUBCUTANEOUS
  Filled 2024-02-12: qty 0.3

## 2024-02-12 MED ORDER — FERROUS SULFATE 325 (65 FE) MG PO TABS
325.0000 mg | ORAL_TABLET | Freq: Every day | ORAL | Status: DC
  Administered 2024-02-13 – 2024-02-16 (×4): 325 mg via ORAL
  Filled 2024-02-12 (×4): qty 1

## 2024-02-12 MED ORDER — MIDODRINE HCL 5 MG PO TABS
10.0000 mg | ORAL_TABLET | Freq: Three times a day (TID) | ORAL | Status: AC
  Administered 2024-02-12 (×2): 10 mg via ORAL
  Filled 2024-02-12 (×3): qty 2

## 2024-02-12 MED ORDER — SODIUM CHLORIDE 0.9 % IV SOLN
1.0000 g | Freq: Every day | INTRAVENOUS | Status: DC
  Filled 2024-02-12: qty 10

## 2024-02-12 MED ORDER — PROCHLORPERAZINE EDISYLATE 10 MG/2ML IJ SOLN: 5.0000 mg | Freq: Four times a day (QID) | INTRAMUSCULAR | Status: DC | PRN

## 2024-02-12 MED ORDER — ORAL CARE MOUTH RINSE: 15.0000 mL | OROMUCOSAL | Status: DC

## 2024-02-12 MED ORDER — IPRATROPIUM-ALBUTEROL 0.5-2.5 (3) MG/3ML IN SOLN
3.0000 mL | Freq: Four times a day (QID) | RESPIRATORY_TRACT | Status: AC
  Administered 2024-02-12 (×2): 3 mL via RESPIRATORY_TRACT
  Filled 2024-02-12 (×2): qty 3

## 2024-02-12 MED ORDER — MAGNESIUM OXIDE -MG SUPPLEMENT 400 (240 MG) MG PO TABS: 400.0000 mg | ORAL_TABLET | Freq: Every day | ORAL | Status: DC

## 2024-02-12 MED ORDER — ASPIRIN 81 MG PO TBEC
81.0000 mg | DELAYED_RELEASE_TABLET | Freq: Every day | ORAL | Status: DC
  Administered 2024-02-12 – 2024-02-16 (×5): 81 mg via ORAL
  Filled 2024-02-12 (×5): qty 1

## 2024-02-12 MED ORDER — MELATONIN 5 MG PO TABS: 5.0000 mg | ORAL_TABLET | Freq: Every evening | ORAL | Status: DC | PRN

## 2024-02-12 MED ORDER — LOSARTAN POTASSIUM 50 MG PO TABS: 50.0000 mg | ORAL_TABLET | Freq: Every day | ORAL | Status: DC

## 2024-02-12 MED ORDER — ONDANSETRON HCL 4 MG PO TABS: 4.0000 mg | ORAL_TABLET | Freq: Four times a day (QID) | ORAL | Status: DC | PRN

## 2024-02-12 MED ORDER — MIDODRINE HCL 5 MG PO TABS
10.0000 mg | ORAL_TABLET | ORAL | Status: AC
  Administered 2024-02-12: 10 mg via ORAL
  Filled 2024-02-12: qty 2

## 2024-02-12 MED ORDER — SODIUM CHLORIDE 0.9 % IV SOLN
2.0000 g | INTRAVENOUS | Status: DC
  Administered 2024-02-12 – 2024-02-15 (×4): 2 g via INTRAVENOUS
  Filled 2024-02-12 (×4): qty 20

## 2024-02-12 MED ORDER — MAGNESIUM SULFATE 4 GM/100ML IV SOLN
4.0000 g | Freq: Once | INTRAVENOUS | Status: AC
  Administered 2024-02-12: 4 g via INTRAVENOUS
  Filled 2024-02-12: qty 100

## 2024-02-12 MED ORDER — CHLORHEXIDINE GLUCONATE CLOTH 2 % EX PADS
6.0000 | MEDICATED_PAD | Freq: Every day | CUTANEOUS | Status: DC
  Administered 2024-02-12 – 2024-02-16 (×5): 6 via TOPICAL

## 2024-02-12 MED ORDER — FLUTICASONE FUROATE-VILANTEROL 100-25 MCG/ACT IN AEPB
1.0000 | INHALATION_SPRAY | Freq: Every day | RESPIRATORY_TRACT | Status: DC
  Administered 2024-02-12 – 2024-02-16 (×5): 1 via RESPIRATORY_TRACT
  Filled 2024-02-12: qty 28

## 2024-02-12 MED ORDER — MONTELUKAST SODIUM 10 MG PO TABS
10.0000 mg | ORAL_TABLET | Freq: Every day | ORAL | Status: DC
  Administered 2024-02-12 – 2024-02-16 (×5): 10 mg via ORAL
  Filled 2024-02-12 (×5): qty 1

## 2024-02-12 MED ORDER — IPRATROPIUM BROMIDE 0.06 % NA SOLN: 2.0000 | Freq: Three times a day (TID) | NASAL | Status: DC | PRN

## 2024-02-12 MED ORDER — FUROSEMIDE 10 MG/ML IJ SOLN
40.0000 mg | Freq: Once | INTRAMUSCULAR | Status: AC
  Administered 2024-02-12: 40 mg via INTRAVENOUS
  Filled 2024-02-12: qty 4

## 2024-02-12 MED ORDER — IPRATROPIUM-ALBUTEROL 0.5-2.5 (3) MG/3ML IN SOLN
3.0000 mL | RESPIRATORY_TRACT | Status: DC
  Administered 2024-02-12: 3 mL via RESPIRATORY_TRACT
  Filled 2024-02-12: qty 3

## 2024-02-12 MED ORDER — ACETAMINOPHEN 325 MG PO TABS
650.0000 mg | ORAL_TABLET | Freq: Four times a day (QID) | ORAL | Status: DC | PRN
Start: 2024-02-12 — End: 2024-02-12

## 2024-02-12 MED ORDER — ISOSORBIDE MONONITRATE ER 60 MG PO TB24: 90.0000 mg | ORAL_TABLET | Freq: Every day | ORAL | Status: DC

## 2024-02-12 MED ORDER — ATORVASTATIN CALCIUM 40 MG PO TABS
80.0000 mg | ORAL_TABLET | Freq: Every day | ORAL | Status: DC
  Administered 2024-02-12 – 2024-02-15 (×4): 80 mg via ORAL
  Filled 2024-02-12 (×4): qty 2

## 2024-02-12 MED ORDER — ACETAMINOPHEN 650 MG RE SUPP
650.0000 mg | Freq: Four times a day (QID) | RECTAL | Status: DC | PRN
Start: 2024-02-12 — End: 2024-02-16

## 2024-02-12 MED ORDER — POLYETHYLENE GLYCOL 3350 17 G PO PACK: 17.0000 g | PACK | Freq: Every day | ORAL | Status: DC | PRN

## 2024-02-12 MED ORDER — POTASSIUM CHLORIDE CRYS ER 20 MEQ PO TBCR
40.0000 meq | EXTENDED_RELEASE_TABLET | Freq: Once | ORAL | Status: AC
  Administered 2024-02-12: 40 meq via ORAL
  Filled 2024-02-12: qty 2

## 2024-02-12 MED ORDER — IPRATROPIUM-ALBUTEROL 0.5-2.5 (3) MG/3ML IN SOLN
3.0000 mL | Freq: Once | RESPIRATORY_TRACT | Status: AC
  Administered 2024-02-12: 3 mL via RESPIRATORY_TRACT
  Filled 2024-02-12: qty 3

## 2024-02-12 MED ORDER — POTASSIUM PHOSPHATES 15 MMOLE/5ML IV SOLN
30.0000 mmol | Freq: Once | INTRAVENOUS | Status: AC
  Administered 2024-02-12: 30 mmol via INTRAVENOUS
  Filled 2024-02-12: qty 10

## 2024-02-12 MED ORDER — METOPROLOL SUCCINATE ER 25 MG PO TB24: 25.0000 mg | ORAL_TABLET | Freq: Every day | ORAL | Status: DC

## 2024-02-12 MED ORDER — LEVOTHYROXINE SODIUM 75 MCG PO TABS
175.0000 ug | ORAL_TABLET | Freq: Every day | ORAL | Status: DC
  Administered 2024-02-12 – 2024-02-15 (×4): 175 ug via ORAL
  Filled 2024-02-12 (×4): qty 1

## 2024-02-12 MED ORDER — MAGNESIUM OXIDE -MG SUPPLEMENT 400 (240 MG) MG PO TABS
400.0000 mg | ORAL_TABLET | Freq: Every day | ORAL | Status: DC
  Administered 2024-02-13: 400 mg via ORAL
  Filled 2024-02-12: qty 1

## 2024-02-12 MED ORDER — TRAMADOL HCL 50 MG PO TABS
50.0000 mg | ORAL_TABLET | Freq: Two times a day (BID) | ORAL | Status: DC
  Administered 2024-02-12 – 2024-02-16 (×9): 50 mg via ORAL
  Filled 2024-02-12 (×9): qty 1

## 2024-02-12 MED ORDER — HYDROCORTISONE SOD SUC (PF) 100 MG IJ SOLR
100.0000 mg | INTRAMUSCULAR | Status: AC
  Administered 2024-02-12: 100 mg via INTRAVENOUS
  Filled 2024-02-12: qty 2

## 2024-02-12 MED ORDER — OYSTER SHELL CALCIUM/D3 500-5 MG-MCG PO TABS
1.0000 | ORAL_TABLET | Freq: Two times a day (BID) | ORAL | Status: DC
  Administered 2024-02-12 – 2024-02-16 (×9): 1 via ORAL
  Filled 2024-02-12 (×9): qty 1

## 2024-02-12 MED ORDER — LIDOCAINE 5 % EX PTCH
1.0000 | MEDICATED_PATCH | Freq: Every day | CUTANEOUS | Status: DC
  Administered 2024-02-12 – 2024-02-16 (×5): 1 via TRANSDERMAL
  Filled 2024-02-12 (×5): qty 1

## 2024-02-12 MED ORDER — PANTOPRAZOLE SODIUM 40 MG PO TBEC
40.0000 mg | DELAYED_RELEASE_TABLET | Freq: Two times a day (BID) | ORAL | Status: DC
Start: 2024-02-12 — End: 2024-02-16
  Administered 2024-02-12 – 2024-02-16 (×9): 40 mg via ORAL
  Filled 2024-02-12 (×9): qty 1

## 2024-02-12 NOTE — Plan of Care (Signed)
  Problem: Education: Goal: Knowledge of General Education information will improve Description: Including pain rating scale, medication(s)/side effects and non-pharmacologic comfort measures Outcome: Progressing   Problem: Clinical Measurements: Goal: Diagnostic test results will improve Outcome: Progressing   Problem: Clinical Measurements: Goal: Respiratory complications will improve Outcome: Progressing   Problem: Clinical Measurements: Goal: Cardiovascular complication will be avoided Outcome: Progressing   Problem: Nutrition: Goal: Adequate nutrition will be maintained Outcome: Progressing

## 2024-02-12 NOTE — H&P (Signed)
 History and Physical  Cindy [name] BMW:[id number] DOB: [birth year] DOA: 02/11/2024  PCP: Jimmey Mould, MD   Chief Complaint: Confusion  HPI: Cindy [name] is a 89 y.o. female with medical history significant for COPD/ILD, chronic diastolic heart failure, CAD, GERD, HLD, HTN, allergic rhinitis, and chronic pain who presented [facility] for evaluation of altered mental status.  According to son, patient and her husband went to [employer]. While she was eating, husband noticed that patient seemed really out and confused.  Patient had rigors, chills and almost passed out so they called EMS. Patient reports that she does not remember much of what happened but remembers going to lunch with her husband and also waking up in the ER. Reports that they had a family gathering at [place] with almost 17 family members, including the grandkids and she felt fine.  She has had some chills since she returned on Monday however she is always cold at baseline. She has had a mild productive cough but denies any nausea, vomiting, abdominal pain, chest pain, dysuria, headache or vision changes.  [facility] Course: Vitals were notable for temp max 101.8; tachycardic; normotensive throughout her course at [facility], RR 18 - 30; oxygen saturation 83% on RA, she was started on BiPAP and was given a small IV fluid bolus, following which her respiratory status worsened, raising concern for potential secondary contribution from volume overload. She received broad-spectrum IV antibiotics with IV vancomycin , cefepime  and Flagyl , IV Solu-Cortef , a dose of IV Lasix  20 mg, and was subsequent transition to from BiPAP to heated high flow 30 L/min with 40% FiO2 .   Labs were notable for lactic acid 1.8->1.6, creatinine 1.96, WBC 8.5, Hgb 11.2, BNP 1569, negative COVID, RSV and flu test, negative MRSA screen, UA negative for UTI. Imaging notable for CT scanning that showed patchy airspace opacity in the right lower lobe,  concerning for pneumonia.  Patient was admitted to the [facility] service and transferred to [facility].  Review of Systems: Please see HPI for pertinent positives and negatives. A complete 10 system review of systems are otherwise negative.  Past Medical History:  Diagnosis Date   Asthma    Bronchitis    COPD (chronic obstructive pulmonary disease) (HCC)    Coronary artery disease    MI age 29   GERD (gastroesophageal reflux disease)    Heart attack (HCC)    Hypercholesteremia    Hypertension    ILD (interstitial lung disease) (HCC)    Iron deficiency anemia    used to see hem in [place], [place].    Renal insufficiency    Thyroid  disease    Past Surgical History:  Procedure Laterality Date   APPENDECTOMY     BREAST BIOPSY Right    benign   CORONARY ANGIOPLASTY WITH STENT PLACEMENT     CORONARY ARTERY BYPASS GRAFT  09/1979   LEFT HEART CATH AND CORS/GRAFTS ANGIOGRAPHY N/A 11/27/2018   Procedure: LEFT HEART CATH AND CORS/GRAFTS ANGIOGRAPHY;  Surgeon: Avanell Leigh, MD;  Location: [facility] INVASIVE CV LAB;  Service: Cardiovascular;  Laterality: N/A;   Moh's Surgeries     for squamous cell carcinoma   PARTIAL HYSTERECTOMY  10/1978   Social History:  reports that she quit smoking about 44 years ago. Her smoking use included cigarettes. She started smoking about 66 years ago. She has a 33 pack-year smoking history. She has never used smokeless tobacco. She reports current alcohol  use. She reports that she does not use  drugs.  Allergies  Allergen Reactions   Codeine Other (See Comments)    Headache from heavy doses of codeine only    Family History  Problem Relation Age of Onset   Heart disease Mother    Heart disease Father    Melanoma Sister        x2   Breast cancer Neg Hx      Prior to Admission medications   Medication Sig Start Date End Date Taking? Authorizing Provider  albuterol  (VENTOLIN  HFA) 108 (90 Base) MCG/ACT inhaler Inhale 1 puff into the lungs every 4 (four) hours as  needed for wheezing or shortness of breath. Patient taking differently: Inhale 2 puffs into the lungs 2 (two) times daily as needed for wheezing or shortness of breath. 03/14/23   Vada Garibaldi, MD  aspirin  81 MG tablet Take 81 mg by mouth daily.     [provider]  atorvastatin  (LIPITOR ) 80 MG tablet Take 1 tablet (80 mg total) by mouth daily. 12/25/18   Hammond, Janine, NP  benzonatate  (TESSALON ) 200 MG capsule Take 1 capsule by mouth twice daily as needed for cough Patient taking differently: Take 200 mg by mouth 2 (two) times daily as needed for cough. 09/23/20   Lind Repine, MD  Calcium  Carbonate-Vitamin D 600-200 MG-UNIT TABS Take 1 tablet by mouth 2 (two) times daily.    [provider]  ferrous sulfate  325 (65 FE) MG tablet Take 325 mg by mouth daily with breakfast.    [provider]  fexofenadine  (ALLEGRA ) 180 MG tablet Take 1 tablet (180 mg total) by mouth daily. 01/28/23   Lind Repine, MD  furosemide  (LASIX ) 20 MG tablet Take one tablet (20 mg) daily x 5 days 11/21/23   Lind Repine, MD  guaifenesin  (HUMIBID E) 400 MG TABS tablet Take 400 mg by mouth in the morning and at bedtime. 07/02/23   [provider]  ipratropium (ATROVENT ) 0.06 % nasal spray Place 2 sprays into both nostrils 3 (three) times daily as needed for rhinitis.    [provider]  isosorbide  mononitrate (IMDUR ) 30 MG 24 hr tablet Take 90 mg by mouth daily. 11/13/21   [provider]  levothyroxine  (SYNTHROID , LEVOTHROID) 175 MCG tablet Take 175 mcg by mouth every evening.    [provider]  losartan  (COZAAR ) 50 MG tablet Take 50 mg by mouth daily.    [provider]  metoprolol  succinate (TOPROL -XL) 25 MG 24 hr tablet Take 1 tablet (25 mg total) by mouth daily. 02/14/23   Hugh Madura, MD  montelukast  (SINGULAIR ) 10 MG tablet Take 10 mg by mouth daily. 08/31/20   [provider]  Multiple Vitamins-Minerals (ICAPS AREDS 2 PO) Take 1  capsule by mouth in the morning and at bedtime.    [provider]  Niacinamide-Zn-Cu-Methfo-Se-Cr (NICOTINAMIDE PO) Take 1 capsule by mouth 2 (two) times daily.    [provider]  nitroGLYCERIN  (NITROSTAT ) 0.4 MG SL tablet PLACE 1 TABLET UNDER THE TONGUE EVERY 5 MINUTES AS NEEDED FOR CHEST PAIN Patient taking differently: Place 0.4 mg under the tongue every 5 (five) minutes as needed for chest pain. 09/11/19   Hugh Madura, MD  pantoprazole  (PROTONIX ) 40 MG tablet Take 40 mg by mouth 2 (two) times daily.    [provider]  spironolactone -hydrochlorothiazide  (ALDACTAZIDE) 25-25 MG tablet Take 1 tablet by mouth daily.    [provider]  TART CHERRY PO Take 1 tablet by mouth 2 (two) times  a week.    [provider]  traMADol  (ULTRAM ) 50 MG tablet Take 50 mg by mouth 2 (two) times daily.    [provider]    Physical Exam: BP (!) 108/26   Pulse 69   Temp 98 F (36.7 C) (Axillary)   Resp 17   Ht 5' 0.5" (1.537 m)   Wt 66.3 kg   LMP  (LMP Unknown)   SpO2 100%   BMI 28.08 kg/m  General: Pleasant, acutely ill elderly woman laying in bed. No acute distress. HEENT: [place]/AT. Anicteric sclera.  CV: RRR. No murmurs, rubs, or gallops. Trace BLE edema Pulmonary: On 3 L [place]. Lungs CTAB. Normal effort. No wheezing.  Mild rhonchi. Distant rales at the bases. Abdominal: Soft, nontender, nondistended. Normal bowel sounds. Extremities: Palpable radial and DP pulses. Normal ROM. Skin: Warm and dry. No obvious rash or lesions. Neuro: A&Ox3. Moves all extremities. Normal sensation to light touch. No focal deficit.          Labs on Admission:  Basic Metabolic Panel: Recent Labs  Lab 02/11/24 1655 02/11/24 2046 02/12/24 0431  NA 140 140 138  K 4.2 3.6 3.4*  CL 101  --  105  CO2 25  --  23  GLUCOSE 123*  --  191*  BUN 32*  --  35*  CREATININE 1.96*  --  1.93*  CALCIUM  10.6*  --  9.2  MG  --   --  1.3*  PHOS  --   --  1.7*   Liver  Function Tests: Recent Labs  Lab 02/11/24 1655  AST 27  ALT 11  ALKPHOS 79  BILITOT 0.4  PROT 7.1  ALBUMIN  4.5   No results for input(s): "LIPASE", "AMYLASE" in the last 168 hours. No results for input(s): "AMMONIA" in the last 168 hours. CBC: Recent Labs  Lab 02/11/24 1655 02/11/24 2046  WBC 8.5  --   NEUTROABS 6.2  --   HGB 11.2* 10.2*  HCT 33.6* 30.0*  MCV 99.4  --   PLT 156  --    Cardiac Enzymes: No results for input(s): "CKTOTAL", "CKMB", "CKMBINDEX", "TROPONINI" in the last 168 hours. BNP (last 3 results) Recent Labs    10/29/23 0504 10/30/23 0514 02/12/24 0431  BNP 291.5* 213.6* 1,569.3*    ProBNP (last 3 results) Recent Labs    02/11/24 1655  PROBNP 1,860.0*    CBG: No results for input(s): "GLUCAP" in the last 168 hours.  Radiological Exams on Admission: DG Chest 1 View Result Date: 02/11/2024 CLINICAL DATA:  Confusion UTI EXAM: CHEST  1 VIEW COMPARISON:  02/11/2024, 10/27/2023, CT chest 10/19/2023, chest x-ray 03/11/2023 FINDINGS: Sternotomy. Chronic interstitial opacities, but with slight increased diffuse interstitial and ground-glass opacity similar compared to radiograph earlier this evening. Stable cardiomediastinal silhouette with aortic atherosclerosis. No pneumothorax. Possible trace effusion IMPRESSION: Chronic interstitial opacities with slight increased diffuse interstitial and ground-glass opacity similar compared to radiograph earlier this evening, possible mild edema superimposed on underlying chronic change. Possible trace effusions. Electronically Signed   By: Esmeralda Hedge M.D.   On: 02/11/2024 19:43   DG Chest Portable 1 View Result Date: 02/11/2024 CLINICAL DATA:  Cough. EXAM: PORTABLE CHEST 1 VIEW COMPARISON:  October 27, 2023. FINDINGS: Stable cardiomediastinal silhouette. Sternotomy wires are noted. Mild central pulmonary vascular congestion is noted. No consolidative process is noted. Bony thorax is unremarkable. IMPRESSION: Mild  central pulmonary vascular congestion. Electronically Signed   By: Rosalene Colon M.D.   On: 02/11/2024 17:34  CT ABDOMEN PELVIS WO CONTRAST Result Date: 02/11/2024 CLINICAL DATA:  Acute abdominal pain.  UTI. EXAM: CT ABDOMEN AND PELVIS WITHOUT CONTRAST TECHNIQUE: Multidetector CT imaging of the abdomen and pelvis was performed following the standard protocol without IV contrast. RADIATION DOSE REDUCTION: This exam was performed according to the departmental dose-optimization program which includes automated exposure control, adjustment of the mA and/or kV according to patient size and/or use of iterative reconstruction technique. COMPARISON:  CT chest abdomen and pelvis 03/11/2023. FINDINGS: Lower chest: There is patchy airspace disease in the right lower lobe. Hepatobiliary: No focal liver abnormality is seen. No gallstones, gallbladder wall thickening, or biliary dilatation. Pancreas: Unremarkable. No pancreatic ductal dilatation or surrounding inflammatory changes. Spleen: Normal in size without focal abnormality. Adrenals/Urinary Tract: Small posterior right bladder diverticulum is again seen. The bladder is otherwise within normal limits. The kidneys and adrenal glands are within normal limits. Stomach/Bowel: The stomach is distended with large air-fluid level. Small bowel and colon are nondilated. The appendix is not seen. No focal wall thickening or inflammation identified. There is a large amount of stool in the rectum. Vascular/Lymphatic: Aortic atherosclerosis. No enlarged abdominal or pelvic lymph nodes. Reproductive: Choose chilled Other: No abdominal wall hernia or abnormality. No abdominopelvic ascites. There is a surgical clip in the left inguinal region. Musculoskeletal: the bones are osteopenic. Degenerative changes affect the spine. There is grade 1 anterolisthesis at L4-L5 which is unchanged. IMPRESSION: 1. No acute localizing process in the abdomen or pelvis. 2. Large amount of stool in the  rectum. 3. Patchy airspace disease in the right lower lobe worrisome for pneumonia. 4. Aortic atherosclerosis. Aortic Atherosclerosis (ICD10-I70.0). Electronically Signed   By: Tyron Gallon M.D.   On: 02/11/2024 17:33   Assessment/Plan BRITTANYANN WITTNER is a 89 y.o. female with medical history significant for COPD/ILD, chronic diastolic heart failure, CAD, GERD, HLD, HTN, allergic rhinitis, and chronic pain who presented to [facility] for evaluation of altered mental status and admitted for severe sepsis secondary to pneumonia  # Severe sepsis # Community-acquired pneumonia - Presented with altered mental status and found to have evidence of pneumonia on imaging - Met severe sepsis criteria with fever, tachycardia, altered mental status and evidence of respiratory infection - Mental status back to her baseline this morning, breathing much better - Pro-Cal significantly elevated to 8.87, MRSA screen negative - Start IV Rocephin  and oral azithromycin  for 4 more days - Follow-up blood and sputum cultures, urine strep pneumo and Legionella - Trend CBC, fever curve  # Acute hypoxic respiratory failure - In the setting of pneumonia and CHF exacerbation - Respiratory status significantly improved - Able to wean patient from 7L HFNC to 3 L during evaluation - Continue supplemental O2, wean as able  # Acute on chronic diastolic HF - Last TTE in May 2024 showed EF 55-60%, G1 DD, moderately elevated PASP, and moderately dilated LA - Patient with new O2 requirement on admission with BNP >1500 and evidence of mild central pulmonary vascular congestion on CXR - No significant urine output after IV Lasix  20 mg in the ED yesterday - Not significantly hypervolemic on exam - Give IV Lasix  40 mg x1 and monitor urine output closely - Hold spironolactone  and HCTZ combo in the setting of AKI - Follow-up repeat TTE - Strict I&O's, daily weights - Trend and replete electrolytes  # AKI on CKD 3B -  Baseline creatinine around 1.3-1.5.  - Rise in creatinine to 1.93 likely secondary to volume overloaded state -  Continue IV diuresing as above - Avoid nephrotoxins adjust - Trend renal function  # Hypomagnesemia # Hypophosphatemia # Hypokalemia - Multiple electrolyte derangements with phosphorus 4.7, magnesium  1.3 and potassium 3.4 - Repleting with IV mag, IV potassium phosphate  and oral Kcl - Resume home oral mag tomorrow - Follow-up repeat BMP, mag and Phos  # COPD/ILD - No wheezing on exam, respiratory status improving - Not on any bronchodilators at home - Start Breo Ellipta  and continue as needed DuoNebs - Continue Singulair  - Incentive spirometer, flutter valve  # HTN - BP soft overnight but SBP now in the 120s - Hold spironolactone  and HCTZ for now, can resume when hypertensive  # CAD # HLD - Continue aspirin  and atorvastatin   # Hypothyroidism - Continue Synthroid  - Follow-up TSH  # Iron deficiency anemia - Hgb stable 11.2 - Continue iron supplementation  # Chronic pain # Lumbar spondylosis - Continue tramadol  - Apply lidocaine  patch   DVT prophylaxis: Lovenox      Code Status: Full Code  Consults called: None  Family Communication: Discussed admission with son bedside  Severity of Illness: The appropriate patient status for this patient is INPATIENT. Inpatient status is judged to be reasonable and necessary in order to provide the required intensity of service to ensure the patient's safety. The patient's presenting symptoms, physical exam findings, and initial radiographic and laboratory data in the context of their chronic comorbidities is felt to place them at high risk for further clinical deterioration. Furthermore, it is not anticipated that the patient will be medically stable for discharge from the hospital within 2 midnights of admission.   * I certify that at the point of admission it is my clinical judgment that the patient will require  inpatient hospital care spanning beyond 2 midnights from the point of admission due to high intensity of service, high risk for further deterioration and high frequency of surveillance required.*  Level of care: Stepdown   This record has been created using Conservation officer, historic buildings. Errors have been sought and corrected, but may not always be located. Such creation errors do not reflect on the standard of care.   Vita Grip, MD 02/12/2024, 7:19 AM [facility] Pager: [phone] Isaiah 41:10   If 7PM-7AM, please contact night-coverage [email or web address] Password [email or web address]

## 2024-02-12 NOTE — ED Notes (Signed)
 Report given to RT at [facility]

## 2024-02-12 NOTE — Progress Notes (Signed)
 PHARMACY - PHYSICIAN COMMUNICATION CRITICAL VALUE ALERT - BLOOD CULTURE IDENTIFICATION (BCID)  Cindy [name] is an 89 y.o. female who presented to [facility] on 02/11/2024 with a chief complaint of confusion  Assessment:  Pt being treated for CAP -BCID + 2/4 Strep pneumo  Name of physician (or Provider) Contacted: Dr. Jerilynn Montenegro  Current antibiotics: azithromycin  500 mg PO daily + ceftriaxone  1 g IV q24h  Changes to prescribed antibiotics recommended: Increase ceftriaxone  to 2 g IV q24h, discontinue azithromycin   Results for orders placed or performed during the hospital encounter of 02/11/24  Blood Culture ID Panel (Reflexed) (Collected: 02/11/2024  4:40 PM)  Result Value Ref Range   Enterococcus faecalis NOT DETECTED NOT DETECTED   Enterococcus Faecium NOT DETECTED NOT DETECTED   Listeria monocytogenes NOT DETECTED NOT DETECTED   Staphylococcus species NOT DETECTED NOT DETECTED   Staphylococcus aureus (BCID) NOT DETECTED NOT DETECTED   Staphylococcus epidermidis NOT DETECTED NOT DETECTED   Staphylococcus lugdunensis NOT DETECTED NOT DETECTED   Streptococcus species DETECTED (A) NOT DETECTED   Streptococcus agalactiae NOT DETECTED NOT DETECTED   Streptococcus pneumoniae DETECTED (A) NOT DETECTED   Streptococcus pyogenes NOT DETECTED NOT DETECTED   A.calcoaceticus-baumannii NOT DETECTED NOT DETECTED   Bacteroides fragilis NOT DETECTED NOT DETECTED   Enterobacterales NOT DETECTED NOT DETECTED   Enterobacter cloacae complex NOT DETECTED NOT DETECTED   Escherichia coli NOT DETECTED NOT DETECTED   Klebsiella aerogenes NOT DETECTED NOT DETECTED   Klebsiella oxytoca NOT DETECTED NOT DETECTED   Klebsiella pneumoniae NOT DETECTED NOT DETECTED   Proteus species NOT DETECTED NOT DETECTED   Salmonella species NOT DETECTED NOT DETECTED   Serratia marcescens NOT DETECTED NOT DETECTED   Haemophilus influenzae NOT DETECTED NOT DETECTED   Neisseria meningitidis NOT DETECTED NOT DETECTED    Pseudomonas aeruginosa NOT DETECTED NOT DETECTED   Stenotrophomonas maltophilia NOT DETECTED NOT DETECTED   Candida albicans NOT DETECTED NOT DETECTED   Candida auris NOT DETECTED NOT DETECTED   Candida glabrata NOT DETECTED NOT DETECTED   Candida krusei NOT DETECTED NOT DETECTED   Candida parapsilosis NOT DETECTED NOT DETECTED   Candida tropicalis NOT DETECTED NOT DETECTED   Cryptococcus neoformans/gattii NOT DETECTED NOT DETECTED    Shireen Dory, PharmD 02/12/2024  12:43 PM

## 2024-02-12 NOTE — Plan of Care (Deleted)
  Problem: Education: Goal: Knowledge of General Education information will improve Description: Including pain rating scale, medication(s)/side effects and non-pharmacologic comfort measures Outcome: Progressing   Problem: Clinical Measurements: Goal: Diagnostic test results will improve Outcome: Progressing   

## 2024-02-12 NOTE — TOC Initial Note (Signed)
 Transition of Care [facility]) - Initial/Assessment Note   Patient Details  Name: Cindy [name] MRN: [id number] Date of Birth: May 02, 1932  Transition of Care [facility]) CM/SW Contact:    Zenon Hilda, LCSW Phone Number: 02/12/2024, 12:00 PM  Clinical Narrative: Patient is from home with spouse. Patient is currently requiring 2L/min oxygen. TOC following for possible discharge needs.  Expected Discharge Plan: Home/Self Care Barriers to Discharge: Continued Medical Work up  Expected Discharge Plan and Services In-house Referral: Clinical Social Work Living arrangements for the past 2 months: Single Family Home  Prior Living Arrangements/Services Living arrangements for the past 2 months: Single Family Home Lives with:: Spouse Patient language and need for interpreter reviewed:: Yes Do you feel safe going back to the place where you live?: Yes      Need for Family Participation in Patient Care: Yes (Comment) Care giver support system in place?: Yes (comment) Current home services: DME (Walker, cane) Criminal Activity/Legal Involvement Pertinent to Current Situation/Hospitalization: No - Comment as needed  Emotional Assessment Orientation: : Oriented to Self, Oriented to Place, Oriented to  Time Alcohol  / Substance Use: Not Applicable Psych Involvement: No (comment)  Admission diagnosis:  Pneumonia of right lower lobe due to infectious organism [J18.9] Sepsis, due to unspecified organism, unspecified whether acute organ dysfunction present (HCC) [A41.9] Acute hypoxic respiratory failure (HCC) [J96.01] Patient Active Problem List   Diagnosis Date Noted   Hypokalemia 02/12/2024   Hypophosphatemia 02/12/2024   Acute hypoxic respiratory failure (HCC) 02/11/2024   Pneumonia 10/27/2023   Acute respiratory failure with hypoxia (HCC) 10/19/2023   Hypomagnesemia 10/19/2023   Hypercalcemia 10/19/2023   Lactic acidosis 10/19/2023   Influenza A 10/19/2023   Bronchiectasis with acute  exacerbation 03/12/2023   Myocardial injury 03/12/2023   Acute metabolic encephalopathy 03/11/2023   Possible acute cystitis 10/08/2022   Dehydration 10/08/2022   Acute renal failure superimposed on stage 3b chronic kidney disease (HCC) 10/08/2022   Acute prerenal azotemia 10/08/2022   Fever 10/08/2022   Acute on chronic diastolic CHF (congestive heart failure) (HCC) 10/08/2022   Severe sepsis (HCC) 12/12/2021   Lumbar spondylosis 03/08/2021   Abdominal aortic atherosclerosis (HCC) 02/14/2021   Essential hypertension 12/25/2018   Carotid artery disease (HCC) 12/25/2018   Chest pain of uncertain etiology    Unstable angina (HCC) 11/26/2018   Thrombocytopenia (HCC) 01/06/2017   Lower urinary tract infectious disease 01/06/2017   Acquired hypothyroidism 01/06/2017   Anemia 01/06/2017   Umbilical hernia 02/04/2014   Coronary atherosclerosis of native coronary artery 12/11/2013   Old MI (myocardial infarction) 12/11/2013   History of tobacco use 12/11/2013   Angina decubitus (HCC) 12/11/2013   HLD (hyperlipidemia) 12/11/2013   Allergic rhinitis 09/16/2012   Interstitial lung disease (HCC) 05/08/2012   Pulmonary nodule, right 05/06/2012   Cough 03/20/2012   Chronic iron deficiency anemia    Chronic kidney disease, stage 3b (HCC)    PCP:  Jimmey Mould, MD Pharmacy:   [facility] [address] [place] - [address] [place] [address] Phone: [phone] Fax: [phone]  [facility] [address] [place] [address] Phone: [phone] Fax: [phone]  Social Drivers of Health (SDOH) Social History: SDOH Screenings   Food Insecurity: No Food Insecurity (10/28/2023)  Housing: Low Risk  (10/28/2023)  Transportation Needs: No Transportation Needs (10/28/2023)  Utilities: Not At Risk (10/28/2023)  Social Connections: Moderately Integrated (10/28/2023)  Tobacco Use: Medium Risk (02/11/2024)   SDOH  Interventions:  Readmission Risk Interventions    02/12/2024   12:00 PM 10/30/2023    2:27 PM 03/14/2023   10:54 AM  Readmission Risk Prevention Plan  Transportation Screening Complete Complete Complete  HRI or Home Care Consult   Complete  Social Work Consult for Recovery Care Planning/Counseling   Complete  Palliative Care Screening   Not Applicable  Medication Review [employer]) Complete Referral to Pharmacy Referral to Pharmacy  PCP or Specialist appointment within 3-5 days of discharge  Complete   HRI or Home Care Consult Complete Complete   SW Recovery Care/Counseling Consult Complete Complete   Palliative Care Screening Not Applicable Not Applicable   Skilled Nursing Facility Not Applicable Not Applicable

## 2024-02-13 ENCOUNTER — Other Ambulatory Visit: Payer: Self-pay

## 2024-02-13 ENCOUNTER — Inpatient Hospital Stay (HOSPITAL_COMMUNITY): Payer: Medicare (Managed Care)

## 2024-02-13 DIAGNOSIS — J9601 Acute respiratory failure with hypoxia: Secondary | ICD-10-CM | POA: Diagnosis not present

## 2024-02-13 DIAGNOSIS — R0609 Other forms of dyspnea: Secondary | ICD-10-CM

## 2024-02-13 LAB — ECHOCARDIOGRAM COMPLETE
AR max vel: 1.49 cm2
AV Peak grad: 12 mmHg
Ao pk vel: 1.73 m/s
Area-P 1/2: 3.13 cm2
Height: 60.5 in
MV M vel: 5.01 m/s
MV Peak grad: 100.4 mmHg
MV VTI: 1.68 cm2
S' Lateral: 3.4 cm
Weight: 2306.89 [oz_av]

## 2024-02-13 LAB — CBC
HCT: 27.8 % — ABNORMAL LOW (ref 36.0–46.0)
Hemoglobin: 9 g/dL — ABNORMAL LOW (ref 12.0–15.0)
MCH: 33.2 pg (ref 26.0–34.0)
MCHC: 32.4 g/dL (ref 30.0–36.0)
MCV: 102.6 fL — ABNORMAL HIGH (ref 80.0–100.0)
Platelets: 110 10*3/uL — ABNORMAL LOW (ref 150–400)
RBC: 2.71 MIL/uL — ABNORMAL LOW (ref 3.87–5.11)
RDW: 13.3 % (ref 11.5–15.5)
WBC: 11.4 10*3/uL — ABNORMAL HIGH (ref 4.0–10.5)
nRBC: 0 % (ref 0.0–0.2)

## 2024-02-13 LAB — COMPREHENSIVE METABOLIC PANEL WITH GFR
ALT: 11 U/L (ref 0–44)
AST: 22 U/L (ref 15–41)
Albumin: 3.3 g/dL — ABNORMAL LOW (ref 3.5–5.0)
Alkaline Phosphatase: 42 U/L (ref 38–126)
Anion gap: 10 (ref 5–15)
BUN: 41 mg/dL — ABNORMAL HIGH (ref 8–23)
CO2: 22 mmol/L (ref 22–32)
Calcium: 9.2 mg/dL (ref 8.9–10.3)
Chloride: 103 mmol/L (ref 98–111)
Creatinine, Ser: 2.23 mg/dL — ABNORMAL HIGH (ref 0.44–1.00)
GFR, Estimated: 21 mL/min — ABNORMAL LOW (ref >60)
Glucose, Bld: 116 mg/dL — ABNORMAL HIGH (ref 70–99)
Potassium: 3.8 mmol/L (ref 3.5–5.1)
Sodium: 135 mmol/L (ref 135–145)
Total Bilirubin: 0.9 mg/dL (ref 0.0–1.2)
Total Protein: 5.7 g/dL — ABNORMAL LOW (ref 6.5–8.1)

## 2024-02-13 LAB — TSH
TSH: 0.527 u[IU]/mL (ref 0.350–4.500)
TSH: 1.569 u[IU]/mL (ref 0.350–4.500)

## 2024-02-13 LAB — PHOSPHORUS: Phosphorus: 3.9 mg/dL (ref 2.5–4.6)

## 2024-02-13 LAB — MAGNESIUM: Magnesium: 2.5 mg/dL — ABNORMAL HIGH (ref 1.7–2.4)

## 2024-02-13 MED ORDER — ORAL CARE MOUTH RINSE: 15.0000 mL | OROMUCOSAL | Status: DC | PRN

## 2024-02-13 MED ORDER — SODIUM CHLORIDE 0.9 % IV SOLN: INTRAVENOUS | Status: AC

## 2024-02-13 MED ORDER — HEPARIN SODIUM (PORCINE) 5000 UNIT/ML IJ SOLN
5000.0000 [IU] | Freq: Three times a day (TID) | INTRAMUSCULAR | Status: DC
  Administered 2024-02-13 – 2024-02-16 (×8): 5000 [IU] via SUBCUTANEOUS
  Filled 2024-02-13 (×8): qty 1

## 2024-02-13 NOTE — Progress Notes (Addendum)
 While the patient was out of bed Rhythm changed, EKG completed and Provider updated. See EKG. (Afib w/competing junctional pacemaker, non specific intraventricular block. cannot r/o ant infarct age undetermined, T Wave abnormality, consider inferior). Hospitalist provider updated.See new order.

## 2024-02-13 NOTE — Hospital Course (Addendum)
 Brief Narrative/Hospital Course: 89 y.o. F r COPD/ILD, chronic diastolic heart failure, CAD, GERD, HLD, HTN, allergic rhinitis, and chronic pain previous pneumonia and UTI presented on 02/11/24 at [facility] ED with confusion, rigors. In the ED negative and respiratory distress, cxr -Chronic interstitial opacities with slight increased diffuse interstitial and ground-glass opacity similar compared to radiograph earlier this evening, possible mild edema superimposed on underlying chronic changes. CTA abd pelvis> no active localizing process, large amount of stool in rectum, patchy airspace disease right LL worrisome for pneumonia. -She was tachycardic febrile with altered mental status and tachypnea and cough and met sepsis criteria source as pneumonia placed on IV antibiotics supplemental oxygen AND transferred from [facility] to [facility] for acute hypoxic respiratory failure in the setting of severe sepsis due to CAP and CHF exacerbation;initially onBipap then to [facility] with decreasing supplemental O2 requirements, subsequently down to  3 L [place] and mental status back to baseline.   Subjective: Seen and examined Son at the bedside.  Patient has no complaints doing well on room air Overnight afebrile BP on higher side, on room air  Labs creatinine improved 1.3  Assessment and plan:  Severe sepsis W/ Streptococcus pneumonia POA CAP-RLL Acute hypoxic respiratory failure due to pneumonia-resolved Acute metabolic encephalopathy due to sepsis-resolved: Presenting with AMS and found to pneumonia severe sepsis hypoxia > and bacteremia w/  Streptococcus pneumonia-likely pulmonary source. Hypoxia resolved, doing well on room air, hemodynamically stable, leukocytosis resolved. Seen by ID repeat blood culture 4/25 NGTD-if positive will need TEE, if remains negative on Sunday afternoon can discharge with 10 days course of Augmentin , Continue standard isolation precaution. Continue current ceftriaxone   Acute on chronic  diastolic HF: Last TTE in May 2024 showed EF 55-60%, G1 DD, moderately elevated PASP, and moderately dilated LA Cxr with mild central pulm congestion,BNP 1569-S/p iv iV Lasix  on admit subsequently held and s/p IVF 2/2 AKI with improvement in renal function TTE reviewed no acute changes- LVEF 55-60%, G2 DD elevated left atrial pressure moderately elevated PASP Monitor intake output Daily weight. Cont to hold spironolactone  and HCTZ. Her weight remains about the same as Jan Wt Readings from Last 3 Encounters:  02/15/24 67 kg  11/21/23 67 kg  10/27/23 66 kg    AKI on CKD 3b: B/l creatinine around 1.3-1.5. s/p IV Lasix  for CHF on admit subsequently given IV fluids  Creatinine improved.   Continue holding diuretics, avoid nephrotoxic medication trend labs urine output  Recent Labs    10/21/23 0338 10/27/23 2051 10/29/23 0504 10/30/23 0514 10/31/23 0527 02/11/24 1655 02/11/24 2046 02/12/24 0431 02/13/24 0313 02/14/24 0312 02/15/24 0654  BUN 38* 41* 35* 34* 37* 32*  --  35* 41* 35* 27*  CREATININE 1.38* 1.66* 1.15* 1.28* 1.45* 1.96*  --  1.93* 2.23* 1.77* 1.39*  CO2 22 28 21* 19* 21* 25  --  23 22 23 24   K 3.8 4.2 3.6 3.7 4.3 4.2 3.6 3.4* 3.8 4.0 4.2    Hypomagnesemia Hypophosphatemia Hypokalemia: Resolved.   COPD/ILD: No wheezing on exam, respiratory status improving.  Continue Singulair  inhalers I-S flutter valve  HTN BP had been soft-mostly diastolic> BP trending up-metoprolol  resumed 4/25 Continue holding spironolactone ,hydrochlorothiazide -resume slowly IF BP trends up further  CAD HLD: Continue aspirin  and atorvastatin   PAC Possible wandering atrial pacemaker: ? A FIB Seen by cardiology at this time unclear, likely PAC -resumeed metoprolol  plan for outpatient Zio patch to monitor for A-fib   Hypothyroidism TSH ok, continue Synthroid    Iron deficiency anemia Stable, cont  iron supplementation   Chronic pain Lumbar spondylosis: Continue tramadol , lidocaine   patch  Deconditioning/debility: Mobilize as able.  Goals of care: Discussed, at this time remains full code but does not want to be on prolonged ventilator  DVT prophylaxis: heparin  injection 5,000 Units Start: 02/13/24 2200 Code Status:   Code Status: Full Code Family Communication: plan of care discussed with patient/son at bedside. Patient status is: Remains hospitalized because of severity of illness Level of care: Telemetry > to tele.  Dispo: The patient is from: home            Anticipated disposition: home tomorrow if blood culture negative

## 2024-02-13 NOTE — Progress Notes (Signed)
 Provider contacted regarding BP. Patient is asymptomatic. She denies SOB, CP/discomfort RR are equal, non-labored . BLL diminished, no crackles/wheezing.    02/13/24 0900  Assess: MEWS Score  BP (!) 127/22  MAP (mmHg) (!) 55  Pulse Rate 84  ECG Heart Rate 93  Resp (!) 24  SpO2 95 %  Assess: MEWS Score  MEWS Temp 0  MEWS Systolic 0  MEWS Pulse 0  MEWS RR 1  MEWS LOC 0  MEWS Score 1  MEWS Score Color Green  Assess: SIRS CRITERIA  SIRS Temperature  0  SIRS Respirations  1  SIRS Pulse 1  SIRS WBC 0  SIRS Score Sum  2

## 2024-02-13 NOTE — Progress Notes (Signed)
 PROGRESS NOTE Cindy [name]  UEA:[id number] DOB: May 16, 1932 DOA: 02/11/2024 PCP: Jimmey Mould, MD  Brief Narrative/Hospital Course: 89 y.o. F r COPD/ILD, chronic diastolic heart failure, CAD, GERD, HLD, HTN, allergic rhinitis, and chronic pain previous pneumonia and UTI presented on 02/11/24 at [facility] ED with confusion, rigors. In the ED negative and respiratory distress, cxr -Chronic interstitial opacities with slight increased diffuse interstitial and ground-glass opacity similar compared to radiograph earlier this evening, possible mild edema superimposed on underlying chronic changes. CTA abd pelvis> no active localizing process, large amount of stool in rectum, patchy airspace disease right LL worrisome for pneumonia. -She was tachycardic febrile with altered mental status and tachypnea and cough and met sepsis criteria source as pneumonia placed on IV antibiotics supplemental oxygen AND transferred from [facility] to [facility] for acute hypoxic respiratory failure in the setting of severe sepsis due to CAP and CHF exacerbation;initially onBipap then to [facility] with decreasing supplemental O2 requirements, subsequently down to  3 L [place] and mental status back to baseline.   Subjective: Seen and examined Patient feels so much better son at the bedside Overnight BP stable, on room air and afebrile Labs reviewed creatinine trending up 2.2 from 1.9 LFTs okay BNP 1569, Pro-Cal 8.8, CBC with hemoglobin 9.0 platelet 110 Vitals with diastolic blood pressure on lower side  Assessment and plan:  Severe sepsis W/ Streptococcus pneumonia POA CAP-rll Acute hypoxic respiratory failure due to pneumonia: Presenting with AMS and found to pneumonia severe sepsis hypoxia . Blood culture gram-positive cocci-BC ID Streptococcus pneumonia- FU c/s Clinically improving.  Hypoxia improved. Continue with ceftriaxone  2 g.  Acute on chronic diastolic HF: Last TTE in May 2024 showed EF 55-60%, G1 DD, moderately elevated PASP,  and moderately dilated LA Cxr with mild central pulmonary vascular congestion,BNP 1569 Received IV Lasix  intermittently monitor and reassess Lasix  need-given AKI will hold off on further Lasix  as hypoxia improving Monitor intake output Daily weight.  Follow-up echo Cont to hold spironolactone  and HCTZ .  Her weight remains about the same/better compared to January Wt Readings from Last 3 Encounters:  02/13/24 65.4 kg  11/21/23 67 kg  10/27/23 66 kg    AKI on CKD 3B B/l creatinine around 1.3-1.5. and slowly trending up in the setting of sepsis Holding diuretics avoid Medication trend labs, monitor intake output Will start IV fluids as no gross overload noted, lungs mostly clear dbp is low. Recent Labs    10/19/23 1140 10/20/23 0347 10/21/23 0338 10/27/23 2051 10/29/23 0504 10/30/23 0514 10/31/23 0527 02/11/24 1655 02/11/24 2046 02/12/24 0431 02/13/24 0313  BUN 31* 25* 38* 41* 35* 34* 37* 32*  --  35* 41*  CREATININE 1.53* 1.25* 1.38* 1.66* 1.15* 1.28* 1.45* 1.96*  --  1.93* 2.23*  CO2 27 23 22 28  21* 19* 21* 25  --  23 22  K 3.7 3.9 3.8 4.2 3.6 3.7 4.3 4.2 3.6 3.4* 3.8    Hypomagnesemia Hypophosphatemia Hypokalemia: Labs improved.   COPD/ILD: No wheezing on exam, respiratory status improving.  Continue Singulair  inhalers I-S flutter valve  HTN BP soft especially diastolic blood pressure, but asymptomatic, holding spironolactone  and HCTZ for now  CAD HLD: Continue aspirin  and atorvastatin    Hypothyroidism TSH ok, continue Synthroid    Iron deficiency anemia Stable, cont iron supplementation   Chronic pain Lumbar spondylosis: Continue tramadol , lidocaine  patch  Goals of care: Discussed, at this time remains full code but does not want to be on prolonged ventilator  DVT prophylaxis: enoxaparin  (LOVENOX ) injection 30  mg Start: 02/12/24 2200 Code Status:   Code Status: Full Code Family Communication: plan of care discussed with patient/son at bedside. Patient  status is: Remains hospitalized because of severity of illness Level of care: Stepdown   Dispo: The patient is from: home            Anticipated disposition: TBD Objective: Vitals last 24 hrs: Vitals:   02/13/24 0300 02/13/24 0400 02/13/24 0500 02/13/24 0600  BP: (!) 118/32 (!) 142/46 (!) 132/20   Pulse: 63 80 64 73  Resp: 16 18 16 17   Temp:  98.6 F (37 C)    TempSrc:  Oral    SpO2: 92% 96% 93% 95%  Weight:   65.4 kg   Height:        Physical Examination: General exam: alert awake, pleasant, on room air  HEENT:Oral mucosa moist, Ear/Nose WNL grossly Respiratory system: Bilaterally clear BS, no use of accessory muscle Cardiovascular system: S1 & S2 +. Gastrointestinal system: Abdomen soft, NT,ND,BS+ Nervous System: Alert, awake,following commands. Extremities: LE edema neg, moving arms legs, warm legs Skin: No rashes,warm. MSK: Normal muscle bulk/tone.   Data Reviewed: I have personally reviewed following labs and imaging studies ( see epic result tab) CBC: Recent Labs  Lab 02/11/24 1655 02/11/24 2046 02/13/24 0313  WBC 8.5  --  11.4*  NEUTROABS 6.2  --   --   HGB 11.2* 10.2* 9.0*  HCT 33.6* 30.0* 27.8*  MCV 99.4  --  102.6*  PLT 156  --  110*   CMP: Recent Labs  Lab 02/11/24 1655 02/11/24 2046 02/12/24 0431 02/13/24 0313  NA 140 140 138 135  K 4.2 3.6 3.4* 3.8  CL 101  --  105 103  CO2 25  --  23 22  GLUCOSE 123*  --  191* 116*  BUN 32*  --  35* 41*  CREATININE 1.96*  --  1.93* 2.23*  CALCIUM  10.6*  --  9.2 9.2  MG  --   --  1.3* 2.5*  PHOS  --   --  1.7* 3.9   GFR: Estimated Creatinine Clearance: 14.6 mL/min (A) (by C-G formula based on SCr of 2.23 mg/dL (H)). Recent Labs  Lab 02/11/24 1655 02/13/24 0313  AST 27 22  ALT 11 11  ALKPHOS 79 42  BILITOT 0.4 0.9  PROT 7.1 5.7*  ALBUMIN  4.5 3.3*   No results for input(s): "LIPASE", "AMYLASE" in the last 168 hours. No results for input(s): "AMMONIA" in the last 168 hours. Coagulation Profile:   Recent Labs  Lab 02/11/24 1655  INR 1.1   Medications reviewed:  Scheduled Meds:  aspirin  EC  81 mg Oral Daily   atorvastatin   80 mg Oral QHS   calcium -vitamin D  1 tablet Oral BID   Chlorhexidine  Gluconate Cloth  6 each Topical Daily   enoxaparin  (LOVENOX ) injection  30 mg Subcutaneous Q24H   ferrous sulfate   325 mg Oral Q breakfast   fluticasone  furoate-vilanterol  1 puff Inhalation Daily   guaiFENesin   400 mg Oral BID   levothyroxine   175 mcg Oral QHS   lidocaine   1 patch Transdermal Daily   magnesium  oxide  400 mg Oral Daily   montelukast   10 mg Oral Daily   pantoprazole   40 mg Oral BID   traMADol   50 mg Oral BID   Continuous Infusions:  cefTRIAXone  (ROCEPHIN )  IV Stopped (02/12/24 1703)    Total time spent in review of labs and imaging, patient evaluation, formulation of plan,  documentation and communication with patient/family: 50 minutes  Lesa Rape, MD [facility] 02/13/2024, 8:34 AM

## 2024-02-13 NOTE — Plan of Care (Signed)

## 2024-02-14 ENCOUNTER — Other Ambulatory Visit: Payer: Self-pay | Admitting: Student

## 2024-02-14 DIAGNOSIS — J9601 Acute respiratory failure with hypoxia: Secondary | ICD-10-CM | POA: Diagnosis not present

## 2024-02-14 DIAGNOSIS — J154 Pneumonia due to other streptococci: Secondary | ICD-10-CM

## 2024-02-14 DIAGNOSIS — I491 Atrial premature depolarization: Secondary | ICD-10-CM

## 2024-02-14 DIAGNOSIS — R7881 Bacteremia: Secondary | ICD-10-CM

## 2024-02-14 DIAGNOSIS — I499 Cardiac arrhythmia, unspecified: Secondary | ICD-10-CM

## 2024-02-14 LAB — CULTURE, BLOOD (ROUTINE X 2)
Special Requests: ADEQUATE
Special Requests: ADEQUATE

## 2024-02-14 LAB — CBC
HCT: 29.2 % — ABNORMAL LOW (ref 36.0–46.0)
Hemoglobin: 9.3 g/dL — ABNORMAL LOW (ref 12.0–15.0)
MCH: 32.7 pg (ref 26.0–34.0)
MCHC: 31.8 g/dL (ref 30.0–36.0)
MCV: 102.8 fL — ABNORMAL HIGH (ref 80.0–100.0)
Platelets: 117 10*3/uL — ABNORMAL LOW (ref 150–400)
RBC: 2.84 MIL/uL — ABNORMAL LOW (ref 3.87–5.11)
RDW: 13.2 % (ref 11.5–15.5)
WBC: 7.3 10*3/uL (ref 4.0–10.5)
nRBC: 0 % (ref 0.0–0.2)

## 2024-02-14 LAB — BASIC METABOLIC PANEL WITH GFR
Anion gap: 10 (ref 5–15)
BUN: 35 mg/dL — ABNORMAL HIGH (ref 8–23)
CO2: 23 mmol/L (ref 22–32)
Calcium: 8.9 mg/dL (ref 8.9–10.3)
Chloride: 102 mmol/L (ref 98–111)
Creatinine, Ser: 1.77 mg/dL — ABNORMAL HIGH (ref 0.44–1.00)
GFR, Estimated: 27 mL/min — ABNORMAL LOW (ref >60)
Glucose, Bld: 105 mg/dL — ABNORMAL HIGH (ref 70–99)
Potassium: 4 mmol/L (ref 3.5–5.1)
Sodium: 135 mmol/L (ref 135–145)

## 2024-02-14 MED ORDER — METOPROLOL SUCCINATE ER 25 MG PO TB24
25.0000 mg | ORAL_TABLET | Freq: Every day | ORAL | Status: DC
Start: 2024-02-14 — End: 2024-02-16
  Administered 2024-02-14 – 2024-02-16 (×3): 25 mg via ORAL
  Filled 2024-02-14 (×3): qty 1

## 2024-02-14 MED ORDER — PRESERVISION AREDS 2 PO CAPS
1.0000 | ORAL_CAPSULE | Freq: Two times a day (BID) | ORAL | Status: DC
  Administered 2024-02-14 – 2024-02-16 (×4): 1 via ORAL

## 2024-02-14 MED ORDER — POLYETHYL GLYCOL-PROPYL GLYCOL 0.4-0.3 % OP SOLN
1.0000 [drp] | OPHTHALMIC | Status: DC | PRN
  Administered 2024-02-14: 1 [drp] via OPHTHALMIC

## 2024-02-14 NOTE — Plan of Care (Signed)
  Problem: Education: Goal: Knowledge of General Education information will improve Description: Including pain rating scale, medication(s)/side effects and non-pharmacologic comfort measures Outcome: Progressing   Problem: Clinical Measurements: Goal: Ability to maintain clinical measurements within normal limits will improve Outcome: Progressing Goal: Will remain free from infection Outcome: Progressing Goal: Diagnostic test results will improve Outcome: Progressing Goal: Respiratory complications will improve Outcome: Progressing Goal: Cardiovascular complication will be avoided Outcome: Progressing   Problem: Activity: Goal: Risk for activity intolerance will decrease Outcome: Progressing   Problem: Nutrition: Goal: Adequate nutrition will be maintained Outcome: Progressing   Problem: Coping: Goal: Level of anxiety will decrease Outcome: Progressing   Problem: Pain Managment: Goal: General experience of comfort will improve and/or be controlled Outcome: Progressing   Problem: Safety: Goal: Ability to remain free from injury will improve Outcome: Progressing   Problem: Skin Integrity: Goal: Risk for impaired skin integrity will decrease Outcome: Progressing   Problem: Activity: Goal: Ability to tolerate increased activity will improve Outcome: Progressing   Problem: Respiratory: Goal: Ability to maintain adequate ventilation will improve Outcome: Progressing Goal: Ability to maintain a clear airway will improve Outcome: Progressing

## 2024-02-14 NOTE — Plan of Care (Signed)
  Problem: Education: Goal: Knowledge of General Education information will improve Description: Including pain rating scale, medication(s)/side effects and non-pharmacologic comfort measures Outcome: Progressing   Problem: Health Behavior/Discharge Planning: Goal: Ability to manage health-related needs will improve Outcome: Progressing   Problem: Clinical Measurements: Goal: Diagnostic test results will improve Outcome: Progressing Goal: Respiratory complications will improve Outcome: Progressing Goal: Cardiovascular complication will be avoided Outcome: Progressing   Problem: Activity: Goal: Risk for activity intolerance will decrease Outcome: Progressing   Problem: Nutrition: Goal: Adequate nutrition will be maintained Outcome: Progressing   Problem: Pain Managment: Goal: General experience of comfort will improve and/or be controlled Outcome: Progressing

## 2024-02-14 NOTE — Consult Note (Signed)
 Cardiology Consultation   Patient ID: Cindy [name] MRN: [id number]; DOB: Sep 30, 1932  Admit date: 02/11/2024 Date of Consult: 02/14/2024  PCP:  Jimmey Mould, MD   [facility] Providers Cardiologist:  Dorothye Gathers, MD        Patient Profile:   Cindy [name] is a 89 y.o. female with a history of CAD with remote MI s/p CABG x1 (SVG to RCA) 40981 (age 64) with subsequent DES x3, chronic diastolic CHF, hypertension, hyperlipidemia, COPD/ interstitial lung disease, hypothyroidism, iron deficiency anemia, GERD, and remote tobacco use (quit in the 1980s) who is being seen 02/14/2024 for the evaluation of atrial fibrillationat the request of Dr. Bobbetta Burnet.  History of Present Illness:   Cindy [name] is a 89 year old female with the above history who is followed by Dr. Renna Cary.  She has a history of remote MI in 89 at the age of 63 while in [place] .  She underwent a single-vessel CABG at that time with SVG to RCA.  She has since had 3 drug-eluting stents placed to native RCA and SVG to RCA graft.  She was admitted for unstable angina in 11/2018.  LHC at that time showed 100% stenosis of ostial to proximal native RCA and 100% in-stent restenosis of distal RCA as well as 100% in-stent restenosis of both the previously placed stents in the proximal and distal segment of SVG to RCA graft with left to right collaterals. No other CAD. Medical therapy was recommended.   Patient was last seen by Dr. Renna Cary in 01/2023 at which time she reported reported occasional chest pain. She was continued on medical therapy at that time. She was admitted in 02/2023 for UTI and was found to have an elevated troponin peaking at 387. Echo showed LVEF of 55-60% with mild hypokinesis of apical septal and anterior walls (possibly due to known LBBB) and grade 1 diastolic dysfunction, normal RV function with moderately elevated PASP, and mild to moderate MR. Troponin elevation was felt to be due to demand ischemia. She was  not complaining of any angina. Medical therapy was recommended.   She was admitted in 09/2023 for acute hypoxic respiratory failure secondary to Influenza A bronchitis/ pneumonitis and then admitted again in 10/2023 for post-viral pneumonia.   She presented to the [facility] ED on 02/11/2024 for further evaluation of altered mental status. She was noted to be febrile with temp of 101.8 and was tachycardic  and hypoxic. She was started on BiPAP and given IV fluids due to concern for sepsis but respiratory status worsened after this. EKG showed normal sinus rhythm with 1st degree AV block and LBBB as well as PAC. Pro-BNP elevated at 1,860. Chest x-ray showed mild central pulmonary vascular congestion.  Abdominal/ pelvic CT showed no acute process in abdomen or pelvis but showed patchy airspace disease in the right lower lobe worrisome for pneumonia. WBC 8.5, Hgb 11.2, Plts 156. Na 140, K 4.2, Glucose 1233, BUN 32, Cr 1.96 (baseline around 1.2 to 1.4). Urinalysis negative. Procalcitonin elevated at 8.87. Lactic acid negative. She was admitted for severe sepsis and acute hypoxic respiratory failure secondary to community acquired pneumonia as well as acute on chronic CHF. Echo showed LVEF of 55-60% with normal wall motion and grade 2 diastolic dysfunction, normal RV function with moderately elevated PASP, severe biatrial enlargement, and mild MR. Cardiology consulted today for new onset atrial fibrillation noted yesterday evening.  Reviewed EKG strip and telemetry. I don't see any clear atrial fibrillation. Looks like sinus  rhythm with PACs and some possible wandering atrial pacemaker but no clear atrial fibrillation.  Patient was in her usual states of health until 02/11/2024 when she developed alter mental status.  Patient went to go to the bathroom and was gone for a while.  Husband went to go check on her and found her sitting on the commode.  She was alert but clearly confused.  Husband eventually got her up  off the commode and took her to the ED.  He states by the time they arrived to the ED, she was "completely out of it;" however, there is no mention of patient being unconscious or unresponsive in the ED notes.  Patient reports a chronic cough but states this is not new.  She denies any shortness of breath prior to admission.  No chest pain, orthopnea, PND, edema, palpitations, lightheadedness, dizziness, syncope.  She was febrile in the ED but denies any fevers at home.  No GI or urinary symptoms.  No abnormal bleeding in urine or stools.  At the time of this evaluation, altered mental status has resolved she is feeling well. She has no complaints at this time.   Past Medical History:  Diagnosis Date   Asthma    Bronchitis    COPD (chronic obstructive pulmonary disease) (HCC)    Coronary artery disease    MI age 87   GERD (gastroesophageal reflux disease)    Heart attack (HCC)    Hypercholesteremia    Hypertension    ILD (interstitial lung disease) (HCC)    Iron deficiency anemia    used to see hem in [place], [place].    Renal insufficiency    Thyroid  disease     Past Surgical History:  Procedure Laterality Date   APPENDECTOMY     BREAST BIOPSY Right    benign   CORONARY ANGIOPLASTY WITH STENT PLACEMENT     CORONARY ARTERY BYPASS GRAFT  09/1979   LEFT HEART CATH AND CORS/GRAFTS ANGIOGRAPHY N/A 11/27/2018   Procedure: LEFT HEART CATH AND CORS/GRAFTS ANGIOGRAPHY;  Surgeon: Avanell Leigh, MD;  Location: [facility] INVASIVE CV LAB;  Service: Cardiovascular;  Laterality: N/A;   Moh's Surgeries     for squamous cell carcinoma   PARTIAL HYSTERECTOMY  10/1978     Home Medications:  Prior to Admission medications   Medication Sig Start Date End Date Taking? Authorizing Provider  albuterol  (VENTOLIN  HFA) 108 (90 Base) MCG/ACT inhaler Inhale 1 puff into the lungs every 4 (four) hours as needed for wheezing or shortness of breath. 03/14/23  Yes Ghimire, Letty Raya, MD  aspirin  81 MG tablet Take 81 mg by  mouth daily.    Yes [provider]  atorvastatin  (LIPITOR ) 80 MG tablet Take 1 tablet (80 mg total) by mouth daily. Patient taking differently: Take 80 mg by mouth at bedtime. 12/25/18  Yes Hammond, Janine, NP  benzonatate  (TESSALON ) 200 MG capsule Take 1 capsule by mouth twice daily as needed for cough 09/23/20  Yes Lind Repine, MD  Calcium  Carbonate-Vitamin D 600-200 MG-UNIT TABS Take 1 tablet by mouth 2 (two) times daily.   Yes [provider]  ferrous sulfate  325 (65 FE) MG tablet Take 325 mg by mouth daily with breakfast.   Yes [provider]  fexofenadine  (ALLEGRA ) 180 MG tablet Take 1 tablet (180 mg total) by mouth daily. 01/28/23  Yes Lind Repine, MD  guaifenesin  (HUMIBID E) 400 MG TABS tablet Take 400 mg by mouth in the morning and at bedtime. 07/02/23  Yes [provider]  ipratropium (ATROVENT ) 0.06 % nasal spray Place 2 sprays into both nostrils 3 (three) times daily as needed for rhinitis.   Yes [provider]  isosorbide  mononitrate (IMDUR ) 30 MG 24 hr tablet Take 90 mg by mouth daily. 11/13/21  Yes [provider]  levothyroxine  (SYNTHROID , LEVOTHROID) 175 MCG tablet Take 175 mcg by mouth at bedtime.   Yes [provider]  losartan  (COZAAR ) 50 MG tablet Take 50 mg by mouth daily.   Yes [provider]  MAGNESIUM  PO Take 400 mg by mouth daily.   Yes [provider]  metoprolol  succinate (TOPROL -XL) 25 MG 24 hr tablet Take 1 tablet (25 mg total) by mouth daily. 02/14/23  Yes Hugh Madura, MD  montelukast  (SINGULAIR ) 10 MG tablet Take 10 mg by mouth daily. 08/31/20  Yes [provider]  Multiple Vitamins-Minerals (ICAPS AREDS 2 PO) Take 1 capsule by mouth in the morning and at bedtime.   Yes [provider]  Niacinamide-Zn-Cu-Methfo-Se-Cr (NICOTINAMIDE PO) Take 1 capsule by mouth 2 (two) times daily.   Yes [provider]  nitroGLYCERIN  (NITROSTAT ) 0.4 MG SL tablet PLACE 1  TABLET UNDER THE TONGUE EVERY 5 MINUTES AS NEEDED FOR CHEST PAIN 09/11/19  Yes Hugh Madura, MD  pantoprazole  (PROTONIX ) 40 MG tablet Take 40 mg by mouth 2 (two) times daily.   Yes [provider]  spironolactone -hydrochlorothiazide  (ALDACTAZIDE) 25-25 MG tablet Take 1 tablet by mouth daily.   Yes [provider]  TART CHERRY PO Take 1 tablet by mouth 2 (two) times a week. M, F   Yes [provider]  traMADol  (ULTRAM ) 50 MG tablet Take 50 mg by mouth 2 (two) times daily.   Yes [provider]    Inpatient Medications: Scheduled Meds:  aspirin  EC  81 mg Oral Daily   atorvastatin   80 mg Oral QHS   calcium -vitamin D  1 tablet Oral BID   Chlorhexidine  Gluconate Cloth  6 each Topical Daily   ferrous sulfate   325 mg Oral Q breakfast   fluticasone  furoate-vilanterol  1 puff Inhalation Daily   guaiFENesin   400 mg Oral BID   heparin  injection (subcutaneous)  5,000 Units Subcutaneous Q8H   levothyroxine   175 mcg Oral QHS   lidocaine   1 patch Transdermal Daily   montelukast   10 mg Oral Daily   pantoprazole   40 mg Oral BID   traMADol   50 mg Oral BID   Continuous Infusions:  sodium chloride  Stopped (02/14/24 0400)   cefTRIAXone  (ROCEPHIN )  IV Stopped (02/13/24 1415)   PRN Meds: acetaminophen  **OR** acetaminophen , guaiFENesin -dextromethorphan , ipratropium, melatonin, ondansetron  **OR** ondansetron  (ZOFRAN ) IV, mouth rinse, polyethylene glycol  Allergies:    Allergies  Allergen Reactions   Codeine Other (See Comments)    Headache from heavy doses of codeine only    Social History:   Social History   Socioeconomic History   Marital status: Married    Spouse name: Not on file   Number of children: 2   Years of education: Not on file   Highest education level: Not on file  Occupational History   Occupation: retired    Comment: retired  Tobacco Use   Smoking status: Former    Current packs/day: 0.00    Average packs/day: 1.5 packs/day for 22.0  years (33.0 ttl pk-yrs)    Types: Cigarettes    Start date: 10/22/1957    Quit date: 10/23/1979    Years since quitting: 44.3   Smokeless tobacco:  Never  Vaping Use   Vaping status: Never Used  Substance and Sexual Activity   Alcohol  use: Yes    Comment: occasional   Drug use: No   Sexual activity: Not on file  Other Topics Concern   Not on file  Social History Narrative   Not on file   Social Drivers of Health   Financial Resource Strain: Not on file  Food Insecurity: No Food Insecurity (02/12/2024)   Hunger Vital Sign    Worried About Running Out of Food in the Last Year: Never true    Ran Out of Food in the Last Year: Never true  Transportation Needs: No Transportation Needs (02/12/2024)   PRAPARE - Administrator, Civil Service (Medical): No    Lack of Transportation (Non-Medical): No  Physical Activity: Not on file  Stress: Not on file  Social Connections: Socially Isolated (02/12/2024)   Social Connection and Isolation Panel [NHANES]    Frequency of Communication with Friends and Family: Never    Frequency of Social Gatherings with Friends and Family: Never    Attends Religious Services: Never    Database administrator or Organizations: No    Attends Banker Meetings: Never    Marital Status: Married  Catering manager Violence: Not At Risk (02/12/2024)   Humiliation, Afraid, Rape, and Kick questionnaire    Fear of Current or Ex-Partner: No    Emotionally Abused: No    Physically Abused: No    Sexually Abused: No    Family History:   Family History  Problem Relation Age of Onset   Heart disease Mother    Heart disease Father    Melanoma Sister        x2   Breast cancer Neg Hx      ROS:  Please see the history of present illness.   Physical Exam/Data:   Vitals:   02/14/24 0540 02/14/24 0600 02/14/24 0700 02/14/24 0800  BP:  (!) 149/43 (!) 155/47 (!) 162/59  Pulse: 81 71 62 79  Resp: 19 17 15 18   Temp: 97.8 F (36.6 C)   97.7 F  (36.5 C)  TempSrc: Oral   Axillary  SpO2: 92% 92% 93% 98%  Weight:      Height:        Intake/Output Summary (Last 24 hours) at 02/14/2024 0845 Last data filed at 02/14/2024 0800 Gross per 24 hour  Intake 1210 ml  Output 2175 ml  Net -965 ml      02/14/2024    5:00 AM 02/13/2024    5:00 AM 02/12/2024    5:15 AM  Last 3 Weights  Weight (lbs) 147 lb 11.3 oz 144 lb 2.9 oz 146 lb 2.6 oz  Weight (kg) 67 kg 65.4 kg 66.3 kg     Body mass index is 28.37 kg/m.  General: 89 y.o. Caucasian female resting comfortably in no acute distress. HEENT: Normocephalic and atraumatic. Sclera clear.  Neck: Supple. Positive hepatojugular reflux.  Heart: RRR. II/ VI systolic murmur heard best at apex.   Lungs: No increased work of breathing.  Faint crackles noted in bilateral bases.  Abdomen: Soft, non-distended, and non-tender to palpation. Extremities: No lower extremity edema.    Skin: Warm and dry. Neuro: Alert and oriented x3. No focal deficits. Psych: Normal affect. Responds appropriately.   EKG:  The EKG was personally reviewed and demonstrates:  Normal sinus rhythm with LBBB and PACs. Telemetry:  Telemetry was personally reviewed and demonstrates:  Sinus  rhythm with PACs and possible intermittent wandering atrial pacemaker. No clear atrial fibrillation.   Relevant CV Studies:  Echocardiogram 02/13/2024: Impressions: 1. Left ventricular ejection fraction, by estimation, is 55 to 60%. The  left ventricle has normal function. The left ventricle has no regional  wall motion abnormalities. Left ventricular diastolic parameters are  consistent with Grade II diastolic  dysfunction (pseudonormalization). Elevated left atrial pressure.   2. Right ventricular systolic function is normal. The right ventricular  size is normal. There is moderately elevated pulmonary artery systolic  pressure.   3. Left atrial size was severely dilated.   4. The mitral valve is normal in structure. Mild mitral  valve  regurgitation. No evidence of mitral stenosis.   5. The aortic valve is normal in structure. There is moderate  calcification of the aortic valve. Aortic valve regurgitation is not  visualized. Aortic valve sclerosis/calcification is present, without any  evidence of aortic stenosis.   6. The inferior vena cava is dilated in size with <50% respiratory  variability, suggesting right atrial pressure of 15 mmHg.   Comparison(s): No significant change from prior study.    Laboratory Data:  High Sensitivity Troponin:  No results for input(s): "TROPONINIHS" in the last 720 hours.   Chemistry Recent Labs  Lab 02/12/24 0431 02/13/24 0313 02/14/24 0312  NA 138 135 135  K 3.4* 3.8 4.0  CL 105 103 102  CO2 23 22 23   GLUCOSE 191* 116* 105*  BUN 35* 41* 35*  CREATININE 1.93* 2.23* 1.77*  CALCIUM  9.2 9.2 8.9  MG 1.3* 2.5*  --   GFRNONAA 24* 21* 27*  ANIONGAP 10 10 10     Recent Labs  Lab 02/11/24 1655 02/13/24 0313  PROT 7.1 5.7*  ALBUMIN  4.5 3.3*  AST 27 22  ALT 11 11  ALKPHOS 79 42  BILITOT 0.4 0.9   Lipids No results for input(s): "CHOL", "TRIG", "HDL", "LABVLDL", "LDLCALC", "CHOLHDL" in the last 168 hours.  Hematology Recent Labs  Lab 02/11/24 1655 02/11/24 2046 02/13/24 0313 02/14/24 0312  WBC 8.5  --  11.4* 7.3  RBC 3.38*  --  2.71* 2.84*  HGB 11.2* 10.2* 9.0* 9.3*  HCT 33.6* 30.0* 27.8* 29.2*  MCV 99.4  --  102.6* 102.8*  MCH 33.1  --  33.2 32.7  MCHC 33.3  --  32.4 31.8  RDW 12.8  --  13.3 13.2  PLT 156  --  110* 117*   Thyroid   Recent Labs  Lab 02/13/24 1902  TSH 1.569    BNP Recent Labs  Lab 02/11/24 1655 02/12/24 0431  BNP  --  1,569.3*  PROBNP 1,860.0*  --     DDimer No results for input(s): "DDIMER" in the last 168 hours.   Radiology/Studies:  ECHOCARDIOGRAM COMPLETE Result Date: 02/13/2024    ECHOCARDIOGRAM REPORT   Patient Name:   Cindy [name] Date of Exam: 02/13/2024 Medical Rec #:  [id number]       Height:       60.5 in  Accession #:    [id number]      Weight:       144.2 lb Date of Birth:  1932/09/08       BSA:          1.634 m Patient Age:    89 years        BP:           133/38 mmHg Patient Gender: F  HR:           88 bpm. Exam Location:  Inpatient Procedure: 2D Echo, Cardiac Doppler and Color Doppler (Both Spectral and Color            Flow Doppler were utilized during procedure). Indications:    Dyspnea  History:        Patient has prior history of Echocardiogram examinations. CAD,                 Signs/Symptoms:Chest Pain; Risk Factors:Hypertension and Former                 Smoker.  Sonographer:    Willey Harrier Referring Phys: [id number] PROSPER M AMPONSAH IMPRESSIONS  1. Left ventricular ejection fraction, by estimation, is 55 to 60%. The left ventricle has normal function. The left ventricle has no regional wall motion abnormalities. Left ventricular diastolic parameters are consistent with Grade II diastolic dysfunction (pseudonormalization). Elevated left atrial pressure.  2. Right ventricular systolic function is normal. The right ventricular size is normal. There is moderately elevated pulmonary artery systolic pressure.  3. Left atrial size was severely dilated.  4. The mitral valve is normal in structure. Mild mitral valve regurgitation. No evidence of mitral stenosis.  5. The aortic valve is normal in structure. There is moderate calcification of the aortic valve. Aortic valve regurgitation is not visualized. Aortic valve sclerosis/calcification is present, without any evidence of aortic stenosis.  6. The inferior vena cava is dilated in size with <50% respiratory variability, suggesting right atrial pressure of 15 mmHg. Comparison(s): No significant change from prior study. FINDINGS  Left Ventricle: Left ventricular ejection fraction, by estimation, is 55 to 60%. The left ventricle has normal function. The left ventricle has no regional wall motion abnormalities. The left ventricular internal cavity size  was normal in size. There is  no left ventricular hypertrophy. Abnormal (paradoxical) septal motion, consistent with left bundle branch block. Left ventricular diastolic parameters are consistent with Grade II diastolic dysfunction (pseudonormalization). Elevated left atrial pressure. Right Ventricle: The right ventricular size is normal. No increase in right ventricular wall thickness. Right ventricular systolic function is normal. There is moderately elevated pulmonary artery systolic pressure. The tricuspid regurgitant velocity is 2.99 m/s, and with an assumed right atrial pressure of 15 mmHg, the estimated right ventricular systolic pressure is 50.8 mmHg. Left Atrium: Left atrial size was severely dilated. Right Atrium: Right atrial size was normal in size. Pericardium: There is no evidence of pericardial effusion. Mitral Valve: The mitral valve is normal in structure. Mild mitral annular calcification. Mild mitral valve regurgitation. No evidence of mitral valve stenosis. MV peak gradient, 8.3 mmHg. The mean mitral valve gradient is 4.0 mmHg. Tricuspid Valve: The tricuspid valve is normal in structure. Tricuspid valve regurgitation is mild . No evidence of tricuspid stenosis. Aortic Valve: The aortic valve is normal in structure. There is moderate calcification of the aortic valve. Aortic valve regurgitation is not visualized. Aortic valve sclerosis/calcification is present, without any evidence of aortic stenosis. Aortic valve peak gradient measures 12.0 mmHg. Pulmonic Valve: The pulmonic valve was normal in structure. Pulmonic valve regurgitation is not visualized. No evidence of pulmonic stenosis. Aorta: The aortic root is normal in size and structure. Venous: The inferior vena cava is dilated in size with less than 50% respiratory variability, suggesting right atrial pressure of 15 mmHg. IAS/Shunts: No atrial level shunt detected by color flow Doppler.  LEFT VENTRICLE PLAX 2D LVIDd:  4.80 cm    Diastology LVIDs:         3.40 cm   LV e' medial:    6.74 cm/s LV PW:         0.90 cm   LV E/e' medial:  21.2 LV IVS:        0.90 cm   LV e' lateral:   7.72 cm/s LVOT diam:     1.90 cm   LV E/e' lateral: 18.5 LV SV:         62 LV SV Index:   38 LVOT Area:     2.84 cm  RIGHT VENTRICLE            IVC RV Basal diam:  3.60 cm    IVC diam: 2.50 cm RV S prime:     8.38 cm/s TAPSE (M-mode): 2.0 cm LEFT ATRIUM             Index        RIGHT ATRIUM           Index LA Vol (A2C):   97.9 ml 59.90 ml/m  RA Area:     11.90 cm LA Vol (A4C):   98.9 ml 60.52 ml/m  RA Volume:   24.20 ml  14.81 ml/m LA Biplane Vol: 98.8 ml 60.45 ml/m  AORTIC VALVE AV Area (Vmax): 1.49 cm AV Vmax:        173.00 cm/s AV Peak Grad:   12.0 mmHg LVOT Vmax:      90.90 cm/s LVOT Vmean:     68.700 cm/s LVOT VTI:       0.219 m  AORTA Ao Root diam: 2.90 cm Ao Asc diam:  3.50 cm MITRAL VALVE                TRICUSPID VALVE MV Area (PHT): 3.13 cm     TR Peak grad:   35.8 mmHg MV Area VTI:   1.68 cm     TR Vmax:        299.00 cm/s MV Peak grad:  8.3 mmHg MV Mean grad:  4.0 mmHg     SHUNTS MV Vmax:       1.44 m/s     Systemic VTI:  0.22 m MV Vmean:      101.0 cm/s   Systemic Diam: 1.90 cm MV Decel Time: 242 msec MR Peak grad: 100.4 mmHg MR Vmax:      501.00 cm/s MV E velocity: 143.00 cm/s MV A velocity: 132.00 cm/s MV E/A ratio:  1.08 Arta Lark Electronically signed by Arta Lark Signature Date/Time: 02/13/2024/10:45:21 AM    Final    DG Chest 1 View Result Date: 02/11/2024 CLINICAL DATA:  Confusion UTI EXAM: CHEST  1 VIEW COMPARISON:  02/11/2024, 10/27/2023, CT chest 10/19/2023, chest x-ray 03/11/2023 FINDINGS: Sternotomy. Chronic interstitial opacities, but with slight increased diffuse interstitial and ground-glass opacity similar compared to radiograph earlier this evening. Stable cardiomediastinal silhouette with aortic atherosclerosis. No pneumothorax. Possible trace effusion IMPRESSION: Chronic interstitial opacities with slight  increased diffuse interstitial and ground-glass opacity similar compared to radiograph earlier this evening, possible mild edema superimposed on underlying chronic change. Possible trace effusions. Electronically Signed   By: Esmeralda Hedge M.D.   On: 02/11/2024 19:43   DG Chest Portable 1 View Result Date: 02/11/2024 CLINICAL DATA:  Cough. EXAM: PORTABLE CHEST 1 VIEW COMPARISON:  October 27, 2023. FINDINGS: Stable cardiomediastinal silhouette. Sternotomy wires are noted. Mild central pulmonary vascular congestion is noted. No consolidative process is noted. Bony  thorax is unremarkable. IMPRESSION: Mild central pulmonary vascular congestion. Electronically Signed   By: Rosalene Colon M.D.   On: 02/11/2024 17:34   CT ABDOMEN PELVIS WO CONTRAST Result Date: 02/11/2024 CLINICAL DATA:  Acute abdominal pain.  UTI. EXAM: CT ABDOMEN AND PELVIS WITHOUT CONTRAST TECHNIQUE: Multidetector CT imaging of the abdomen and pelvis was performed following the standard protocol without IV contrast. RADIATION DOSE REDUCTION: This exam was performed according to the departmental dose-optimization program which includes automated exposure control, adjustment of the mA and/or kV according to patient size and/or use of iterative reconstruction technique. COMPARISON:  CT chest abdomen and pelvis 03/11/2023. FINDINGS: Lower chest: There is patchy airspace disease in the right lower lobe. Hepatobiliary: No focal liver abnormality is seen. No gallstones, gallbladder wall thickening, or biliary dilatation. Pancreas: Unremarkable. No pancreatic ductal dilatation or surrounding inflammatory changes. Spleen: Normal in size without focal abnormality. Adrenals/Urinary Tract: Small posterior right bladder diverticulum is again seen. The bladder is otherwise within normal limits. The kidneys and adrenal glands are within normal limits. Stomach/Bowel: The stomach is distended with large air-fluid level. Small bowel and colon are nondilated. The  appendix is not seen. No focal wall thickening or inflammation identified. There is a large amount of stool in the rectum. Vascular/Lymphatic: Aortic atherosclerosis. No enlarged abdominal or pelvic lymph nodes. Reproductive: Choose chilled Other: No abdominal wall hernia or abnormality. No abdominopelvic ascites. There is a surgical clip in the left inguinal region. Musculoskeletal: the bones are osteopenic. Degenerative changes affect the spine. There is grade 1 anterolisthesis at L4-L5 which is unchanged. IMPRESSION: 1. No acute localizing process in the abdomen or pelvis. 2. Large amount of stool in the rectum. 3. Patchy airspace disease in the right lower lobe worrisome for pneumonia. 4. Aortic atherosclerosis. Aortic Atherosclerosis (ICD10-I70.0). Electronically Signed   By: Tyron Gallon M.D.   On: 02/11/2024 17:33     Assessment and Plan:   PACs  Possible Wandering Atrial Pacemaker Patient was admitted for severe sepsis, acute hypoxic respiratory failure, and acute metabolic encephalopathy secondary to pneumonia. Cardiology was consulted due tot concern for possible atrial fibrillation. However, I reviewed EKG and telemetry and do no see any clear atrial fibrillation. EKG and telemetry shows sinus rhythm with frequent PACs and possible intermittent wandering atrial pacemaker (multiple P wave morphologies). There are sometimes where there is a lot of underlying artifact and it is difficult to see the P wave but at those times the QRS complexes are regular. No clear atrial fibrillation. Echo shows normal LV function. - Currently in normal sinus rhythm. Rate controlled.  - Magnesium  1.3 on 4/23. Repleted and improved to 2.5.  - Potassium 3.4 on 4/23. Repleted and improved to 4.0.  - TSH normal. - Home Toprol -XL 25mg  daily was initially held due to hypotension and sepsis. However, BP better today. Will restart.  - CHA2DS2-VASc = 6 (CAD, CHF, HTN, age x2, female). Will hold off on anticoagulation  right now given no clear atrial fibrillation and will review EKG/ telemetry with MD. If she does have any clear atrial fibrillation, would recommend Eliquis. She does have chronic anemia with hemoglobin in the 9 to 10 range but denies any abnormal bleeding or falls. - Consider outpatient monitor to assess for atrial fibrillation.  Acute on Chronic Diastolic She was initially treated with IV fluids for sepsis; however, respiratory status worsened with this raising concern for volume overload. Pro-BNP elevated at 1,860. Chest x-ray showed mild central pulmonary vascular congestion. Echo showed LVEF of  55-60% with normal wall motion, grade 2 diastolic dysfunction, and elevated left atrial pressure as well as normal RV function with moderately elevated PASP. She was given a couple of doses of IV Lasix  early on in admission. Last dose on 4/23. She is auto-diuresing well with documented urinary output of 1.25 L so far today. Net positive 982 mL. Renal function improving. - She has faint bilateral crackles on exam as well as a positive hepatic jugular reflux. However, no significant edema.  - She is on Spironolactone -HCTZ 25-25mg  daily at home but this was held on admission in setting of sepsis, AKI, and low diastolic BP . She actually received a few doses of Midodrine  on 4/23. BP improved today. Creatinine is starting to trend down today and BP is improving. Given she is auto-diuresing well, I think we can hold off on adding back diuretics today. If renal function and BP remain stable tomorrow, could consider adding back at least one of diuretics tomorrow.  - No SGLT2 inhibitor given history of UTIs.  - Continue monitor volume status closely.   CAD S/p remote CABG x1 (SVG to RCA) in 1981 and multiple DES. Last cath in 2020 showed occluded native RCA and occluded SVG to RCA with left to right collaterals. No other CAD.  - No chest pain.  - Home Imdur  was held due to hypotension. Would add back beta-blocker and  diuretic before adding back Imdur .  - Continue aspirin  and statin.   Hypertension History of hypertension but patient has been hypotensive this admission in setting of sepsis. Diastolic BP as low as the 20s and she was briefly on Midodrine  on 4/23. BP improved diastolic.  - Home Imdur , Toprol -XL, and Spironolactone -HCTZ held on admission due to sepsis and hypotension. However, will restart Toprol -XL 25mg  daily given ectopy and will discuss adding back Spironolactone  with MD as above.  Hyperlipidemia - Continue Lipitor  80mg  daily.   AKI on CKD Stage IIIb Baseline creatine around 1.2 to 1.4 Creatinine 1.96 on admission and peaked at 2.23 on 4/24 in setting of sepsis and hypotension.  - Improved to 1.77.  - Continue to monitor closely.   Otherwise, per primary team: - Sepsis - Community acquired pneumonia - Acute metabolic encephalopathy: resolved - Chronic anemia - Thrombocytopenia    Risk Assessment/Risk Scores:    [place]  Heart Association (NYHA) Functional Class NYHA Class I     For questions or updates, please contact [facility] Please consult [email or web address] for contact info under    Signed, Kayce Betty E Evalynne Locurto, PA-C  02/14/2024 8:45 AM

## 2024-02-14 NOTE — Progress Notes (Signed)
 [facility] Liaison Note  02/14/2024  Cindy [name] 30-Mar-1932 [id number]  Location: RN Hospital Liaison screened the patient remotely at [facility].  Insurance: [employer] Medicare Advantage   Cindy [name] is a 89 y.o. female who is a Primary Care Patient of Jimmey Mould, MD  [facility]). The patient was screened for readmission hospitalization with noted extreme risk score for unplanned readmission risk with 1 IP in 6 months.  The patient was assessed for potential Care Management service needs for post hospital transition for care coordination. Review of patient's electronic medical record reveals patient was admitted for Pneumonia right RLL due to infectious organism. Pt's provider is with [facility] for post hospital follow up on care management needs.   [facility] Care Management/Population Health does not replace or interfere with any arrangements made by the Inpatient Transition of Care team.   For questions contact:   Lilla Reichert, RN, BSN Hospital Liaison [place]   [facility], Population Health Office Hours MTWF  8:00 am-6:00 pm Direct Dial: [phone] mobile [email or web address]

## 2024-02-14 NOTE — Progress Notes (Signed)
 Ordered 2 week Zio monitor to assess for atrial fibrillation. Please see consult note from today for more information.  Delina Kruczek E Dontaye Hur, PA-C 02/14/2024 4:54 PM

## 2024-02-14 NOTE — Consult Note (Addendum)
 [facility]    Date of Admission:  02/11/2024     Reason for Consult: strep pna bsi    Referring Provider: Lesa Rape     Lines:  Peripheral iv's  Abx: 4/23-c ceftriaxone   4/23 azith 4/22 cefepime  4/22 metronidazole  4/22 vanc        Assessment: Strep pna bsi  Pneumonia   89 yo female cad s/p cabg 1981 and subsequent pci, HFpEF, htn/hlp, copd/ild, hypothyroidism, gerd, hx tobacco (quit 1980s) admitted 4/25 with afib/rvr, ams, sepsis and hypoxic respiratory distress found to have strep pna bsi  Onset of sx the same day of admission  Chest ct suggestive although not particularly symptomatic  Tte no obvious valve vegetation  No sign of meningitis or joint involvement  Low risk IE but can happen; however very short duration of sx and source appear to be respiratory. Will w/u IE if repeat bcx positive  Other issue: Afib rvr EKG first degree avb and LBBB and [facility]  Cardiology following    Plan: Repeat bcx Continue ceftriaxone  for now If repeat bcx positive will get tee If repeat bcx negative on Sunday afternoon can discharge to finish 10 day course of treatment with augmentin  Standard isolation precaution Discussed with primary team     ------------------------------------------------ Principal Problem:   Acute hypoxic respiratory failure (HCC) Active Problems:   Severe sepsis (HCC)   Acute on chronic diastolic CHF (congestive heart failure) (HCC)   Hypokalemia   Hypophosphatemia    HPI: Cindy [name] is a 89 y.o. female hx cad s/p cabg 1981 and subsequent pci, HFpEF, htn/hlp, copd/ild, hypothyroidism, gerd, hx tobacco (quit 1980s) admitted 4/25 with afib/rvr, ams, sepsis and hypoxic respiratory distress found to have strep pna bsi  Hx provided by patient/husband who was present at bedside and chart review  Patient well until the day of admission when acute confusion, shaking chill occurs, leaving a routine  primary care clinic for check up. Noted some abnormal breath sound and told by her pcp   No cough, focal bodily pain, prodromal headache, dyspnea, rash, n/v/d  On presentation febrile Hypoxic Chest imaging suggestive of chronic interstitial opacity and patchy airspace opacity rll Empiric abx --> ceftriaxone  when bcx returned 2 of 2 set strep pna  Today patient mentation back to normal No longer hypoxic  Complains of some headache but no stiff neck No joint/back pain No cough/sob  Cardiology following for afib/rvr  Family History  Problem Relation Age of Onset   Heart disease Mother    Heart disease Father    Melanoma Sister        x2   Breast cancer Neg Hx     Social History   Tobacco Use   Smoking status: Former    Current packs/day: 0.00    Average packs/day: 1.5 packs/day for 22.0 years (33.0 ttl pk-yrs)    Types: Cigarettes    Start date: 10/22/1957    Quit date: 10/23/1979    Years since quitting: 44.3   Smokeless tobacco: Never  Vaping Use   Vaping status: Never Used  Substance Use Topics   Alcohol  use: Yes    Comment: occasional   Drug use: No    Allergies  Allergen Reactions   Codeine Other (See Comments)    Headache from heavy doses of codeine only    Review of Systems: ROS All Other ROS was negative, except mentioned above   Past Medical History:  Diagnosis Date  Asthma    Bronchitis    COPD (chronic obstructive pulmonary disease) (HCC)    Coronary artery disease    MI age 89   GERD (gastroesophageal reflux disease)    Heart attack (HCC)    Hypercholesteremia    Hypertension    ILD (interstitial lung disease) (HCC)    Iron deficiency anemia    used to see hem in [place], [place].    Renal insufficiency    Thyroid  disease        Scheduled Meds:  aspirin  EC  81 mg Oral Daily   atorvastatin   80 mg Oral QHS   calcium -vitamin D  1 tablet Oral BID   Chlorhexidine  Gluconate Cloth  6 each Topical Daily   ferrous sulfate   325 mg Oral Q  breakfast   fluticasone  furoate-vilanterol  1 puff Inhalation Daily   guaiFENesin   400 mg Oral BID   heparin  injection (subcutaneous)  5,000 Units Subcutaneous Q8H   levothyroxine   175 mcg Oral QHS   lidocaine   1 patch Transdermal Daily   montelukast   10 mg Oral Daily   pantoprazole   40 mg Oral BID   PreserVision AREDS 2  1 capsule Oral BID   traMADol   50 mg Oral BID   Continuous Infusions:  cefTRIAXone  (ROCEPHIN )  IV Stopped (02/13/24 1415)   PRN Meds:.acetaminophen  **OR** acetaminophen , guaiFENesin -dextromethorphan , ipratropium, melatonin, ondansetron  **OR** ondansetron  (ZOFRAN ) IV, mouth rinse, Polyethyl Glycol-Propyl Glycol, polyethylene glycol   OBJECTIVE: Blood pressure (!) 162/59, pulse 79, temperature 97.7 F (36.5 C), temperature source Axillary, resp. rate 18, height 5' 0.5" (1.537 m), weight 67 kg, SpO2 98%.  Physical Exam  General/constitutional: no distress, pleasant HEENT: Normocephalic, PER, Conj Clear, EOMI, Oropharynx clear Neck supple CV: rrr no mrg Lungs: clear to auscultation, normal respiratory effort Abd: Soft, Nontender Ext: no edema Skin: No Rash Neuro: nonfocal MSK: no peripheral joint swelling/tenderness/warmth; back spines nontender    Lab Results Lab Results  Component Value Date   WBC 7.3 02/14/2024   HGB 9.3 (L) 02/14/2024   HCT 29.2 (L) 02/14/2024   MCV 102.8 (H) 02/14/2024   PLT 117 (L) 02/14/2024    Lab Results  Component Value Date   CREATININE 1.77 (H) 02/14/2024   BUN 35 (H) 02/14/2024   NA 135 02/14/2024   K 4.0 02/14/2024   CL 102 02/14/2024   CO2 23 02/14/2024    Lab Results  Component Value Date   ALT 11 02/13/2024   AST 22 02/13/2024   ALKPHOS 42 02/13/2024   BILITOT 0.9 02/13/2024      Microbiology: Recent Results (from the past 240 hours)  Culture, blood (routine x 2)     Status: Abnormal (Preliminary result)   Collection Time: 02/11/24  4:40 PM   Specimen: BLOOD  Result Value Ref Range Status   Specimen  Description   Final    BLOOD LEFT ANTECUBITAL Performed at [facility], [address], [place], [place] [address]    Special Requests   Final    BOTTLES DRAWN AEROBIC AND ANAEROBIC Blood Culture adequate volume Performed at [facility], [address] [place], [place] [address]    Culture  Setup Time   Final    GRAM POSITIVE COCCI IN PAIRS ANAEROBIC BOTTLE ONLY CRITICAL RESULT CALLED TO, READ BACK BY AND VERIFIED WITH: PHARMD TERRI GREEN ON 02/12/24 @ 1226 BY DRT    Culture (A)  Final    STREPTOCOCCUS PNEUMONIAE SUSCEPTIBILITIES TO FOLLOW Performed at [facility], [address], [place], [place] [address]    Report Status PENDING  Incomplete  Blood Culture ID Panel (Reflexed)     Status: Abnormal   Collection Time: 02/11/24  4:40 PM  Result Value Ref Range Status   Enterococcus faecalis NOT DETECTED NOT DETECTED Final   Enterococcus Faecium NOT DETECTED NOT DETECTED Final   Listeria monocytogenes NOT DETECTED NOT DETECTED Final   Staphylococcus species NOT DETECTED NOT DETECTED Final   Staphylococcus aureus (BCID) NOT DETECTED NOT DETECTED Final   Staphylococcus epidermidis NOT DETECTED NOT DETECTED Final   Staphylococcus lugdunensis NOT DETECTED NOT DETECTED Final   Streptococcus species DETECTED (A) NOT DETECTED Final    Comment: CRITICAL RESULT CALLED TO, READ BACK BY AND VERIFIED WITH: PHARMD TERRI GREEN ON 02/12/24 @ 1226 BY DRT    Streptococcus agalactiae NOT DETECTED NOT DETECTED Final   Streptococcus pneumoniae DETECTED (A) NOT DETECTED Final    Comment: CRITICAL RESULT CALLED TO, READ BACK BY AND VERIFIED WITH: PHARMD TERRI GREEN ON 02/12/24 @ 1226 BY DRT    Streptococcus pyogenes NOT DETECTED NOT DETECTED Final   A.calcoaceticus-baumannii NOT DETECTED NOT DETECTED Final   Bacteroides fragilis NOT DETECTED NOT DETECTED Final   Enterobacterales NOT DETECTED NOT DETECTED Final   Enterobacter cloacae complex NOT DETECTED  NOT DETECTED Final   Escherichia coli NOT DETECTED NOT DETECTED Final   Klebsiella aerogenes NOT DETECTED NOT DETECTED Final   Klebsiella oxytoca NOT DETECTED NOT DETECTED Final   Klebsiella pneumoniae NOT DETECTED NOT DETECTED Final   Proteus species NOT DETECTED NOT DETECTED Final   Salmonella species NOT DETECTED NOT DETECTED Final   Serratia marcescens NOT DETECTED NOT DETECTED Final   Haemophilus influenzae NOT DETECTED NOT DETECTED Final   Neisseria meningitidis NOT DETECTED NOT DETECTED Final   Pseudomonas aeruginosa NOT DETECTED NOT DETECTED Final   Stenotrophomonas maltophilia NOT DETECTED NOT DETECTED Final   Candida albicans NOT DETECTED NOT DETECTED Final   Candida auris NOT DETECTED NOT DETECTED Final   Candida glabrata NOT DETECTED NOT DETECTED Final   Candida krusei NOT DETECTED NOT DETECTED Final   Candida parapsilosis NOT DETECTED NOT DETECTED Final   Candida tropicalis NOT DETECTED NOT DETECTED Final   Cryptococcus neoformans/gattii NOT DETECTED NOT DETECTED Final    Comment: Performed at [facility], [address], [place], [place] [address]  Culture, blood (routine x 2)     Status: Abnormal (Preliminary result)   Collection Time: 02/11/24  4:50 PM   Specimen: BLOOD  Result Value Ref Range Status   Specimen Description   Final    BLOOD RIGHT ANTECUBITAL Performed at [facility], [address], [place], [place] [address]    Special Requests   Final    BOTTLES DRAWN AEROBIC AND ANAEROBIC Blood Culture adequate volume Performed at [facility], [address], [place], [place] [address]    Culture  Setup Time   Final    GRAM POSITIVE COCCI IN PAIRS ANAEROBIC BOTTLE ONLY CRITICAL RESULT CALLED TO, READ BACK BY AND VERIFIED WITH: PHARMD TERRI GREEN ON 02/12/24 @ 1226 BY DRT Performed at [facility], [address] [place], [place] [address]    Culture STREPTOCOCCUS PNEUMONIAE (A)  Final   Report Status  PENDING  Incomplete  Resp panel by RT-PCR (RSV, Flu A&B, Covid)     Status: None   Collection Time: 02/11/24  5:18 PM   Specimen: Nasal Swab  Result Value Ref Range Status   SARS Coronavirus 2 by  RT PCR NEGATIVE NEGATIVE Final    Comment: (NOTE) SARS-CoV-2 target nucleic acids are NOT DETECTED.  The SARS-CoV-2 RNA is generally detectable in upper respiratory specimens during the acute phase of infection. The lowest concentration of SARS-CoV-2 viral copies this assay can detect is 138 copies/mL. A negative result does not preclude SARS-Cov-2 infection and should not be used as the sole basis for treatment or other patient management decisions. A negative result may occur with  improper specimen collection/handling, submission of specimen other than nasopharyngeal swab, presence of viral mutation(s) within the areas targeted by this assay, and inadequate number of viral copies(<138 copies/mL). A negative result must be combined with clinical observations, patient history, and epidemiological information. The expected result is Negative.  Fact Sheet for Patients:  BloggerCourse.com  Fact Sheet for Healthcare Providers:  SeriousBroker.it  This test is no t yet approved or cleared by the [place]  FDA and  has been authorized for detection and/or diagnosis of SARS-CoV-2 by FDA under an Emergency Use Authorization (EUA). This EUA will remain  in effect (meaning this test can be used) for the duration of the COVID-19 declaration under Section 564(b)(1) of the Act, 21 U.S.C.section 360bbb-3(b)(1), unless the authorization is terminated  or revoked sooner.       Influenza A by PCR NEGATIVE NEGATIVE Final   Influenza B by PCR NEGATIVE NEGATIVE Final    Comment: (NOTE) The Xpert Xpress SARS-CoV-2/FLU/RSV plus assay is intended as an aid in the diagnosis of influenza from Nasopharyngeal swab specimens and should not be used as a sole  basis for treatment. Nasal washings and aspirates are unacceptable for Xpert Xpress SARS-CoV-2/FLU/RSV testing.  Fact Sheet for Patients: BloggerCourse.com  Fact Sheet for Healthcare Providers: SeriousBroker.it  This test is not yet approved or cleared by the [place]  FDA and has been authorized for detection and/or diagnosis of SARS-CoV-2 by FDA under an Emergency Use Authorization (EUA). This EUA will remain in effect (meaning this test can be used) for the duration of the COVID-19 declaration under Section 564(b)(1) of the Act, 21 U.S.C. section 360bbb-3(b)(1), unless the authorization is terminated or revoked.     Resp Syncytial Virus by PCR NEGATIVE NEGATIVE Final    Comment: (NOTE) Fact Sheet for Patients: BloggerCourse.com  Fact Sheet for Healthcare Providers: SeriousBroker.it  This test is not yet approved or cleared by the [place]  FDA and has been authorized for detection and/or diagnosis of SARS-CoV-2 by FDA under an Emergency Use Authorization (EUA). This EUA will remain in effect (meaning this test can be used) for the duration of the COVID-19 declaration under Section 564(b)(1) of the Act, 21 U.S.C. section 360bbb-3(b)(1), unless the authorization is terminated or revoked.  Performed at [facility], [address] [place], [place] [address]   MRSA Next Gen by PCR, Nasal     Status: None   Collection Time: 02/12/24  3:05 AM   Specimen: Nasal Mucosa; Nasal Swab  Result Value Ref Range Status   MRSA by PCR Next Gen NOT DETECTED NOT DETECTED Final    Comment: (NOTE) The GeneXpert MRSA Assay (FDA approved for NASAL specimens only), is one component of a comprehensive MRSA colonization surveillance program. It is not intended to diagnose MRSA infection nor to guide or monitor treatment for MRSA infections. Test performance is not FDA  approved in patients less than 70 years old. Performed at [facility], [address] [place], [place] [address]      Serology:    Imaging:  If present, new imagings (plain films, ct scans, and mri) have been personally visualized and interpreted; radiology reports have been reviewed. Decision making incorporated into the Impression / Recommendations.  4/22 cxr FINDINGS: Sternotomy. Chronic interstitial opacities, but with slight increased diffuse interstitial and ground-glass opacity similar compared to radiograph earlier this evening. Stable cardiomediastinal silhouette with aortic atherosclerosis. No pneumothorax. Possible trace effusion   IMPRESSION: Chronic interstitial opacities with slight increased diffuse interstitial and ground-glass opacity similar compared to radiograph earlier this evening, possible mild edema superimposed on underlying chronic change. Possible trace effusions.  4/22 ct abd pelv 1. No acute localizing process in the abdomen or pelvis. 2. Large amount of stool in the rectum. 3. Patchy airspace disease in the right lower lobe worrisome for pneumonia. 4. Aortic atherosclerosis.   4/24 tte  1. Left ventricular ejection fraction, by estimation, is 55 to 60%. The  left ventricle has normal function. The left ventricle has no regional  wall motion abnormalities. Left ventricular diastolic parameters are  consistent with Grade II diastolic  dysfunction (pseudonormalization). Elevated left atrial pressure.   2. Right ventricular systolic function is normal. The right ventricular  size is normal. There is moderately elevated pulmonary artery systolic  pressure.   3. Left atrial size was severely dilated.   4. The mitral valve is normal in structure. Mild mitral valve  regurgitation. No evidence of mitral stenosis.   5. The aortic valve is normal in structure. There is moderate  calcification of the aortic valve. Aortic valve  regurgitation is not  visualized. Aortic valve sclerosis/calcification is present, without any  evidence of aortic stenosis.   6. The inferior vena cava is dilated in size with <50% respiratory  variability, suggesting right atrial pressure of 15 mmHg.   Comparison(s): No significant change from prior study.    Jamesetta Mcbride, MD [facility] [phone] pager    02/14/2024, 11:35 AM

## 2024-02-14 NOTE — Progress Notes (Signed)
 PROGRESS NOTE Cindy [name]  ZOX:[id number] DOB: April 10, 1932 DOA: 02/11/2024 PCP: Jimmey Mould, MD  Brief Narrative/Hospital Course: 89 y.o. F r COPD/ILD, chronic diastolic heart failure, CAD, GERD, HLD, HTN, allergic rhinitis, and chronic pain previous pneumonia and UTI presented on 02/11/24 at [facility] ED with confusion, rigors. In the ED negative and respiratory distress, cxr -Chronic interstitial opacities with slight increased diffuse interstitial and ground-glass opacity similar compared to radiograph earlier this evening, possible mild edema superimposed on underlying chronic changes. CTA abd pelvis> no active localizing process, large amount of stool in rectum, patchy airspace disease right LL worrisome for pneumonia. -She was tachycardic febrile with altered mental status and tachypnea and cough and met sepsis criteria source as pneumonia placed on IV antibiotics supplemental oxygen AND transferred from [facility] to [facility] for acute hypoxic respiratory failure in the setting of severe sepsis due to CAP and CHF exacerbation;initially onBipap then to [facility] with decreasing supplemental O2 requirements, subsequently down to  3 L [place] and mental status back to baseline.   Subjective: Seen and examined Patient feels so much better son at the bedside Overnight BP stable, on room air and afebrile Labs reviewed creatinine trending up 2.2 from 1.9 LFTs okay BNP 1569, Pro-Cal 8.8, CBC with hemoglobin 9.0 platelet 110 Vitals with diastolic blood pressure on lower side  Assessment and plan:  Severe sepsis W/ Streptococcus pneumonia POA CAP-rll Acute hypoxic respiratory failure due to pneumonia: Presenting with AMS and found to pneumonia severe sepsis hypoxia . Blood culture gram-positive cocci-BC ID Streptococcus pneumonia- FU c/s Clinically improving.  Hypoxia improved. Continue with ceftriaxone  2 g.  Acute on chronic diastolic HF: Last TTE in May 2024 showed EF 55-60%, G1 DD, moderately elevated PASP,  and moderately dilated LA Cxr with mild central pulmonary vascular congestion,BNP 1569 Received IV Lasix  intermittently monitor and reassess Lasix  need-given AKI will hold off on further Lasix  as hypoxia improving Monitor intake output Daily weight.  Follow-up echo Cont to hold spironolactone  and HCTZ .  Her weight remains about the same/better compared to January Wt Readings from Last 3 Encounters:  02/13/24 65.4 kg  11/21/23 67 kg  10/27/23 66 kg    AKI on CKD 3B B/l creatinine around 1.3-1.5. and slowly trending up in the setting of sepsis Holding diuretics avoid Medication trend labs, monitor intake output Will start IV fluids as no gross overload noted, lungs mostly clear dbp is low. Recent Labs    10/19/23 1140 10/20/23 0347 10/21/23 0338 10/27/23 2051 10/29/23 0504 10/30/23 0514 10/31/23 0527 02/11/24 1655 02/11/24 2046 02/12/24 0431 02/13/24 0313  BUN 31* 25* 38* 41* 35* 34* 37* 32*  --  35* 41*  CREATININE 1.53* 1.25* 1.38* 1.66* 1.15* 1.28* 1.45* 1.96*  --  1.93* 2.23*  CO2 27 23 22 28  21* 19* 21* 25  --  23 22  K 3.7 3.9 3.8 4.2 3.6 3.7 4.3 4.2 3.6 3.4* 3.8    Hypomagnesemia Hypophosphatemia Hypokalemia: Labs improved.   COPD/ILD: No wheezing on exam, respiratory status improving.  Continue Singulair  inhalers I-S flutter valve  HTN BP soft especially diastolic blood pressure, but asymptomatic, holding spironolactone  and HCTZ for now  CAD HLD: Continue aspirin  and atorvastatin    Hypothyroidism TSH ok, continue Synthroid    Iron deficiency anemia Stable, cont iron supplementation   Chronic pain Lumbar spondylosis: Continue tramadol , lidocaine  patch  Goals of care: Discussed, at this time remains full code but does not want to be on prolonged ventilator  DVT prophylaxis: enoxaparin  (LOVENOX ) injection 30  mg Start: 02/12/24 2200 Code Status:   Code Status: Full Code Family Communication: plan of care discussed with patient/son at bedside. Patient  status is: Remains hospitalized because of severity of illness Level of care: Stepdown   Dispo: The patient is from: home            Anticipated disposition: TBD Objective: Vitals last 24 hrs: Vitals:   02/13/24 0300 02/13/24 0400 02/13/24 0500 02/13/24 0600  BP: (!) 118/32 (!) 142/46 (!) 132/20   Pulse: 63 80 64 73  Resp: 16 18 16 17   Temp:  98.6 F (37 C)    TempSrc:  Oral    SpO2: 92% 96% 93% 95%  Weight:   65.4 kg   Height:        Physical Examination: General exam: alert awake, pleasant, on room air  HEENT:Oral mucosa moist, Ear/Nose WNL grossly Respiratory system: Bilaterally clear BS, no use of accessory muscle Cardiovascular system: S1 & S2 +. Gastrointestinal system: Abdomen soft, NT,ND,BS+ Nervous System: Alert, awake,following commands. Extremities: LE edema neg, moving arms legs, warm legs Skin: No rashes,warm. MSK: Normal muscle bulk/tone.   Data Reviewed: I have personally reviewed following labs and imaging studies ( see epic result tab) CBC: Recent Labs  Lab 02/11/24 1655 02/11/24 2046 02/13/24 0313  WBC 8.5  --  11.4*  NEUTROABS 6.2  --   --   HGB 11.2* 10.2* 9.0*  HCT 33.6* 30.0* 27.8*  MCV 99.4  --  102.6*  PLT 156  --  110*   CMP: Recent Labs  Lab 02/11/24 1655 02/11/24 2046 02/12/24 0431 02/13/24 0313  NA 140 140 138 135  K 4.2 3.6 3.4* 3.8  CL 101  --  105 103  CO2 25  --  23 22  GLUCOSE 123*  --  191* 116*  BUN 32*  --  35* 41*  CREATININE 1.96*  --  1.93* 2.23*  CALCIUM  10.6*  --  9.2 9.2  MG  --   --  1.3* 2.5*  PHOS  --   --  1.7* 3.9   GFR: Estimated Creatinine Clearance: 14.6 mL/min (A) (by C-G formula based on SCr of 2.23 mg/dL (H)). Recent Labs  Lab 02/11/24 1655 02/13/24 0313  AST 27 22  ALT 11 11  ALKPHOS 79 42  BILITOT 0.4 0.9  PROT 7.1 5.7*  ALBUMIN  4.5 3.3*   No results for input(s): "LIPASE", "AMYLASE" in the last 168 hours. No results for input(s): "AMMONIA" in the last 168 hours. Coagulation Profile:   Recent Labs  Lab 02/11/24 1655  INR 1.1   Medications reviewed:  Scheduled Meds:  aspirin  EC  81 mg Oral Daily   atorvastatin   80 mg Oral QHS   calcium -vitamin D  1 tablet Oral BID   Chlorhexidine  Gluconate Cloth  6 each Topical Daily   enoxaparin  (LOVENOX ) injection  30 mg Subcutaneous Q24H   ferrous sulfate   325 mg Oral Q breakfast   fluticasone  furoate-vilanterol  1 puff Inhalation Daily   guaiFENesin   400 mg Oral BID   levothyroxine   175 mcg Oral QHS   lidocaine   1 patch Transdermal Daily   magnesium  oxide  400 mg Oral Daily   montelukast   10 mg Oral Daily   pantoprazole   40 mg Oral BID   traMADol   50 mg Oral BID   Continuous Infusions:  cefTRIAXone  (ROCEPHIN )  IV Stopped (02/12/24 1703)    Total time spent in review of labs and imaging, patient evaluation, formulation of plan,  documentation and communication with patient/family: 50 minutes  Cindy Rape, MD [facility] 02/13/2024, 8:34 AM

## 2024-02-15 DIAGNOSIS — J9601 Acute respiratory failure with hypoxia: Secondary | ICD-10-CM | POA: Diagnosis not present

## 2024-02-15 LAB — BASIC METABOLIC PANEL WITH GFR
Anion gap: 11 (ref 5–15)
BUN: 27 mg/dL — ABNORMAL HIGH (ref 8–23)
CO2: 24 mmol/L (ref 22–32)
Calcium: 9.8 mg/dL (ref 8.9–10.3)
Chloride: 104 mmol/L (ref 98–111)
Creatinine, Ser: 1.39 mg/dL — ABNORMAL HIGH (ref 0.44–1.00)
GFR, Estimated: 36 mL/min — ABNORMAL LOW (ref >60)
Glucose, Bld: 95 mg/dL (ref 70–99)
Potassium: 4.2 mmol/L (ref 3.5–5.1)
Sodium: 139 mmol/L (ref 135–145)

## 2024-02-15 LAB — CBC
HCT: 31.6 % — ABNORMAL LOW (ref 36.0–46.0)
Hemoglobin: 10.2 g/dL — ABNORMAL LOW (ref 12.0–15.0)
MCH: 33.2 pg (ref 26.0–34.0)
MCHC: 32.3 g/dL (ref 30.0–36.0)
MCV: 102.9 fL — ABNORMAL HIGH (ref 80.0–100.0)
Platelets: 141 10*3/uL — ABNORMAL LOW (ref 150–400)
RBC: 3.07 MIL/uL — ABNORMAL LOW (ref 3.87–5.11)
RDW: 12.8 % (ref 11.5–15.5)
WBC: 6.3 10*3/uL (ref 4.0–10.5)
nRBC: 0 % (ref 0.0–0.2)

## 2024-02-15 NOTE — Plan of Care (Signed)

## 2024-02-15 NOTE — Progress Notes (Signed)
 PROGRESS NOTE Cindy [name]  ZOX:[id number] DOB: 21-Sep-1932 DOA: 02/11/2024 PCP: Jimmey Mould, MD  Brief Narrative/Hospital Course:   Brief Narrative/Hospital Course: 89 y.o. F r COPD/ILD, chronic diastolic heart failure, CAD, GERD, HLD, HTN, allergic rhinitis, and chronic pain previous pneumonia and UTI presented on 02/11/24 at [facility] ED with confusion, rigors. In the ED negative and respiratory distress, cxr -Chronic interstitial opacities with slight increased diffuse interstitial and ground-glass opacity similar compared to radiograph earlier this evening, possible mild edema superimposed on underlying chronic changes. CTA abd pelvis> no active localizing process, large amount of stool in rectum, patchy airspace disease right LL worrisome for pneumonia. -She was tachycardic febrile with altered mental status and tachypnea and cough and met sepsis criteria source as pneumonia placed on IV antibiotics supplemental oxygen AND transferred from [facility] to [facility] for acute hypoxic respiratory failure in the setting of severe sepsis due to CAP and CHF exacerbation;initially onBipap then to [facility] with decreasing supplemental O2 requirements, subsequently down to  3 L [place] and mental status back to baseline.   Subjective: Seen and examined Son at the bedside.  Patient has no complaints doing well on room air Overnight afebrile BP on higher side, on room air  Labs creatinine improved 1.3  Assessment and plan:  Severe sepsis W/ Streptococcus pneumonia POA CAP-RLL Acute hypoxic respiratory failure due to pneumonia-resolved Acute metabolic encephalopathy due to sepsis-resolved: Presenting with AMS and found to pneumonia severe sepsis hypoxia > and bacteremia w/  Streptococcus pneumonia-likely pulmonary source. Hypoxia resolved, doing well on room air, hemodynamically stable, leukocytosis resolved. Seen by ID repeat blood culture 4/25 NGTD-if positive will need TEE, if remains negative on Sunday afternoon can  discharge with 10 days course of Augmentin , Continue standard isolation precaution. Continue current ceftriaxone   Acute on chronic diastolic HF: Last TTE in May 2024 showed EF 55-60%, G1 DD, moderately elevated PASP, and moderately dilated LA Cxr with mild central pulm congestion,BNP 1569-S/p iv iV Lasix  on admit subsequently held and s/p IVF 2/2 AKI with improvement in renal function TTE reviewed no acute changes- LVEF 55-60%, G2 DD elevated left atrial pressure moderately elevated PASP Monitor intake output Daily weight. Cont to hold spironolactone  and HCTZ. Her weight remains about the same as Jan Wt Readings from Last 3 Encounters:  02/15/24 67 kg  11/21/23 67 kg  10/27/23 66 kg    AKI on CKD 3b: B/l creatinine around 1.3-1.5. s/p IV Lasix  for CHF on admit subsequently given IV fluids  Creatinine improved.   Continue holding diuretics, avoid nephrotoxic medication trend labs urine output  Recent Labs    10/21/23 0338 10/27/23 2051 10/29/23 0504 10/30/23 0514 10/31/23 0527 02/11/24 1655 02/11/24 2046 02/12/24 0431 02/13/24 0313 02/14/24 0312 02/15/24 0654  BUN 38* 41* 35* 34* 37* 32*  --  35* 41* 35* 27*  CREATININE 1.38* 1.66* 1.15* 1.28* 1.45* 1.96*  --  1.93* 2.23* 1.77* 1.39*  CO2 22 28 21* 19* 21* 25  --  23 22 23 24   K 3.8 4.2 3.6 3.7 4.3 4.2 3.6 3.4* 3.8 4.0 4.2    Hypomagnesemia Hypophosphatemia Hypokalemia: Resolved.   COPD/ILD: No wheezing on exam, respiratory status improving.  Continue Singulair  inhalers I-S flutter valve  HTN BP had been soft-mostly diastolic> BP trending up-metoprolol  resumed 4/25 Continue holding spironolactone ,hydrochlorothiazide -resume slowly IF BP trends up further  CAD HLD: Continue aspirin  and atorvastatin   PAC Possible wandering atrial pacemaker: ? A FIB Seen by cardiology at this time unclear, likely PAC -resumeed metoprolol  plan  for outpatient Zio patch to monitor for A-fib   Hypothyroidism TSH ok, continue  Synthroid    Iron deficiency anemia Stable, cont iron supplementation   Chronic pain Lumbar spondylosis: Continue tramadol , lidocaine  patch  Deconditioning/debility: Mobilize as able.  Goals of care: Discussed, at this time remains full code but does not want to be on prolonged ventilator  DVT prophylaxis: heparin  injection 5,000 Units Start: 02/13/24 2200 Code Status:   Code Status: Full Code Family Communication: plan of care discussed with patient/son at bedside. Patient status is: Remains hospitalized because of severity of illness Level of care: Telemetry > to tele.  Dispo: The patient is from: home            Anticipated disposition: home tomorrow if blood culture negative   Objective: Vitals last 24 hrs: Vitals:   02/15/24 0006 02/15/24 0600 02/15/24 0701 02/15/24 0901  BP: (!) 159/94 (!) 155/67    Pulse: 81 64    Resp: 18 18    Temp: 99.1 F (37.3 C) 98.5 F (36.9 C)    TempSrc: Oral Oral    SpO2: 94% 98%  95%  Weight:   67 kg   Height:        Physical Examination: General exam: alert awake, older than stated age HEENT:Oral mucosa moist, Ear/Nose WNL grossly Respiratory system: Bilaterally clear BS, mild cough on breathing deeper, no use of accessory muscle Cardiovascular system: S1 & S2 +. Gastrointestinal system: Abdomen soft, NT,ND,BS+ Nervous System: Alert, awake,following commands and nonfocal Extremities: LE edema neg, moving arms and legs Skin: No rashes,warm. MSK: Normal muscle bulk/tone.   Data Reviewed: I have personally reviewed following labs and imaging studies ( see epic result tab) CBC: Recent Labs  Lab 02/11/24 1655 02/11/24 2046 02/13/24 0313 02/14/24 0312 02/15/24 0654  WBC 8.5  --  11.4* 7.3 6.3  NEUTROABS 6.2  --   --   --   --   HGB 11.2* 10.2* 9.0* 9.3* 10.2*  HCT 33.6* 30.0* 27.8* 29.2* 31.6*  MCV 99.4  --  102.6* 102.8* 102.9*  PLT 156  --  110* 117* 141*   CMP: Recent Labs  Lab 02/11/24 1655 02/11/24 2046  02/12/24 0431 02/13/24 0313 02/14/24 0312 02/15/24 0654  NA 140 140 138 135 135 139  K 4.2 3.6 3.4* 3.8 4.0 4.2  CL 101  --  105 103 102 104  CO2 25  --  23 22 23 24   GLUCOSE 123*  --  191* 116* 105* 95  BUN 32*  --  35* 41* 35* 27*  CREATININE 1.96*  --  1.93* 2.23* 1.77* 1.39*  CALCIUM  10.6*  --  9.2 9.2 8.9 9.8  MG  --   --  1.3* 2.5*  --   --   PHOS  --   --  1.7* 3.9  --   --    GFR: Estimated Creatinine Clearance: 23.7 mL/min (A) (by C-G formula based on SCr of 1.39 mg/dL (H)). Recent Labs  Lab 02/11/24 1655 02/13/24 0313  AST 27 22  ALT 11 11  ALKPHOS 79 42  BILITOT 0.4 0.9  PROT 7.1 5.7*  ALBUMIN  4.5 3.3*   No results for input(s): "LIPASE", "AMYLASE" in the last 168 hours. No results for input(s): "AMMONIA" in the last 168 hours. Coagulation Profile:  Recent Labs  Lab 02/11/24 1655  INR 1.1   Medications reviewed:  Scheduled Meds:  aspirin  EC  81 mg Oral Daily   atorvastatin   80 mg Oral QHS  calcium -vitamin D  1 tablet Oral BID   Chlorhexidine  Gluconate Cloth  6 each Topical Daily   ferrous sulfate   325 mg Oral Q breakfast   fluticasone  furoate-vilanterol  1 puff Inhalation Daily   guaiFENesin   400 mg Oral BID   heparin  injection (subcutaneous)  5,000 Units Subcutaneous Q8H   levothyroxine   175 mcg Oral QHS   lidocaine   1 patch Transdermal Daily   metoprolol  succinate  25 mg Oral Daily   montelukast   10 mg Oral Daily   pantoprazole   40 mg Oral BID   PreserVision AREDS 2  1 capsule Oral BID   traMADol   50 mg Oral BID   Continuous Infusions:  cefTRIAXone  (ROCEPHIN )  IV 2 g (02/14/24 1434)    Total time spent in review of labs and imaging, patient evaluation, formulation of plan, documentation and communication with patient/family: 35 minutes  Lesa Rape, MD [facility] 02/15/2024, 11:26 AM

## 2024-02-16 DIAGNOSIS — R7881 Bacteremia: Secondary | ICD-10-CM | POA: Diagnosis not present

## 2024-02-16 DIAGNOSIS — J9601 Acute respiratory failure with hypoxia: Secondary | ICD-10-CM | POA: Diagnosis not present

## 2024-02-16 DIAGNOSIS — J189 Pneumonia, unspecified organism: Secondary | ICD-10-CM | POA: Diagnosis not present

## 2024-02-16 DIAGNOSIS — I5033 Acute on chronic diastolic (congestive) heart failure: Secondary | ICD-10-CM | POA: Diagnosis not present

## 2024-02-16 MED ORDER — SPIRONOLACTONE-HCTZ 25-25 MG PO TABS: 1.0000 | ORAL_TABLET | Freq: Every day | ORAL | 1 refills | Status: DC

## 2024-02-16 MED ORDER — AMOXICILLIN-POT CLAVULANATE 875-125 MG PO TABS: 1.0000 | ORAL_TABLET | Freq: Two times a day (BID) | ORAL | 0 refills | Status: AC

## 2024-02-16 MED ORDER — LOSARTAN POTASSIUM 25 MG PO TABS: 25.0000 mg | ORAL_TABLET | Freq: Every day | ORAL | 3 refills | Status: AC

## 2024-02-16 NOTE — Discharge Instructions (Signed)
 Check your blood pressure daily at home.  If blood pressure is low, with systolic less than 110.  You should hold spironolactone /hydrochlorothiazide , water pill.  And call your PCP.

## 2024-02-16 NOTE — Discharge Summary (Signed)
 Physician Discharge Summary   Patient: Cindy [name] MRN: [id number] DOB: November 27, 1931  Admit date:     02/11/2024  Discharge date: 02/16/24  Discharge Physician: Ozell Blunt   PCP: Jimmey Mould, MD   Recommendations at discharge:   Follow-up PCP in 1 week Follow-up cardiology on 03/11/2024 Cut down the dose of losartan  to 25 mg daily Continue Aldactone /hydrochlorothiazide  25/25.  Recommend to check blood pressure at home.  And hold this medication if SBP less than 110  Discharge Diagnoses: Principal Problem:   Acute hypoxic respiratory failure (HCC) Active Problems:   Severe sepsis (HCC)   Acute on chronic diastolic CHF (congestive heart failure) (HCC)   Hypokalemia   Hypophosphatemia   Bacteremia  Resolved Problems:   * No resolved hospital problems. *  Hospital Course:   Brief Narrative/Hospital Course: 89 y.o. F r COPD/ILD, chronic diastolic heart failure, CAD, GERD, HLD, HTN, allergic rhinitis, and chronic pain previous pneumonia and UTI presented on 02/11/24 at [facility] ED with confusion, rigors. In the ED negative and respiratory distress, cxr -Chronic interstitial opacities with slight increased diffuse interstitial and ground-glass opacity similar compared to radiograph earlier this evening, possible mild edema superimposed on underlying chronic changes. CTA abd pelvis> no active localizing process, large amount of stool in rectum, patchy airspace disease right LL worrisome for pneumonia. -She was tachycardic febrile with altered mental status and tachypnea and cough and met sepsis criteria source as pneumonia placed on IV antibiotics supplemental oxygen AND transferred from [facility] to [facility] for acute hypoxic respiratory failure in the setting of severe sepsis due to CAP and CHF exacerbation;initially onBipap then to [facility] with decreasing supplemental O2 requirements, subsequently down to  3 L [place] and mental status back to baseline.      Severe sepsis W/ Streptococcus pneumonia  POA CAP-RLL Acute hypoxic respiratory failure due to pneumonia-resolved Acute metabolic encephalopathy due to sepsis-resolved: Presenting with AMS and found to pneumonia severe sepsis hypoxia > and bacteremia w/  Streptococcus pneumonia-likely pulmonary source. Hypoxia resolved, doing well on room air, hemodynamically stable, leukocytosis resolved. -Started on IV ceftriaxone  Seen by ID repeat blood culture 4/25 is negative as of 02/16/2024.   - Discussed with ID, no need for TEE.  Will discharge on Augmentin  to complete 10 days of treatment.   Acute on chronic diastolic HF: Last TTE in May 2024 showed EF 55-60%, G1 DD, moderately elevated PASP, and moderately dilated LA Cxr with mild central pulm congestion,BNP 1569-S/p iv iV Lasix  on admit subsequently held and s/p IVF 2/2 AKI with improvement in renal function TTE reviewed no acute changes- LVEF 55-60%, G2 DD elevated left atrial pressure moderately elevated PASP -Blood pressure is now rising, patient has been taking this medication for long time.  Will continue Aldactone /HCTZ with recommendation to hold for SBP less than 210 at home. -Patient will follow-up with cardiology on May 21.    AKI on CKD 3b: B/l creatinine around 1.3-1.5. s/p IV Lasix  for CHF on admit subsequently given IV fluids  Creatinine improved.   -Creatinine is now 1.39. -Diuretics have been restarted due to elevated BNP of 1569 as above -Patient has been taking these diuretics for a long time at home. -Will follow-up with PCP in 1 week to check renal function  Hypophosphatemia Hypokalemia: Resolved.   COPD/ILD: No wheezing on exam, respiratory status improving.  No longer requiring oxygen  HTN Blood pressure is now elevated -Started back on medications as above  CAD HLD: Continue aspirin  and atorvastatin   PAC  Possible wandering atrial pacemaker: ? A FIB Seen by cardiology at this time unclear, likely PAC -resumeed metoprolol  plan for outpatient Zio  patch to monitor for A-fib   Hypothyroidism TSH ok, continue Synthroid    Iron deficiency anemia Stable, cont iron supplementation           Consultants: Cardiology Procedures performed:  Disposition: Home Diet recommendation:  Discharge Diet Orders (From admission, onward)     Start     Ordered   02/16/24 0000  Diet - low sodium heart healthy        02/16/24 1411           Regular diet DISCHARGE MEDICATION: Allergies as of 02/16/2024       Reactions   Codeine Other (See Comments)   Headache from heavy doses of codeine only        Medication List     TAKE these medications    albuterol  108 (90 Base) MCG/ACT inhaler Commonly known as: VENTOLIN  HFA Inhale 1 puff into the lungs every 4 (four) hours as needed for wheezing or shortness of breath.   amoxicillin -clavulanate 875-125 MG tablet Commonly known as: AUGMENTIN  Take 1 tablet by mouth 2 (two) times daily for 7 days.   aspirin  81 MG tablet Take 81 mg by mouth daily.   atorvastatin  80 MG tablet Commonly known as: LIPITOR  Take 1 tablet (80 mg total) by mouth daily. What changed: when to take this   benzonatate  200 MG capsule Commonly known as: TESSALON  Take 1 capsule by mouth twice daily as needed for cough   Calcium  Carbonate-Vitamin D 600-200 MG-UNIT Tabs Take 1 tablet by mouth 2 (two) times daily.   ferrous sulfate  325 (65 FE) MG tablet Take 325 mg by mouth daily with breakfast.   fexofenadine  180 MG tablet Commonly known as: ALLEGRA  Take 1 tablet (180 mg total) by mouth daily.   guaifenesin  400 MG Tabs tablet Commonly known as: HUMIBID E Take 400 mg by mouth in the morning and at bedtime.   ICAPS AREDS 2 PO Take 1 capsule by mouth in the morning and at bedtime.   ipratropium 0.06 % nasal spray Commonly known as: ATROVENT  Place 2 sprays into both nostrils 3 (three) times daily as needed for rhinitis.   isosorbide  mononitrate 30 MG 24 hr tablet Commonly known as: IMDUR  Take 90 mg  by mouth daily.   levothyroxine  175 MCG tablet Commonly known as: SYNTHROID  Take 175 mcg by mouth at bedtime.   losartan  25 MG tablet Commonly known as: Cozaar  Take 1 tablet (25 mg total) by mouth daily. What changed:  medication strength how much to take   MAGNESIUM  PO Take 400 mg by mouth daily.   metoprolol  succinate 25 MG 24 hr tablet Commonly known as: TOPROL -XL Take 1 tablet (25 mg total) by mouth daily.   montelukast  10 MG tablet Commonly known as: SINGULAIR  Take 10 mg by mouth daily.   NICOTINAMIDE PO Take 1 capsule by mouth 2 (two) times daily.   nitroGLYCERIN  0.4 MG SL tablet Commonly known as: NITROSTAT  PLACE 1 TABLET UNDER THE TONGUE EVERY 5 MINUTES AS NEEDED FOR CHEST PAIN   pantoprazole  40 MG tablet Commonly known as: PROTONIX  Take 40 mg by mouth 2 (two) times daily.   spironolactone -hydrochlorothiazide  25-25 MG tablet Commonly known as: ALDACTAZIDE Take 1 tablet by mouth daily.   TART CHERRY PO Take 1 tablet by mouth 2 (two) times a week. M, F   traMADol  50 MG tablet Commonly known as: ULTRAM  Take  50 mg by mouth 2 (two) times daily.        Follow-up Information     [facility] Dept. of [place]. [facility] Follow up.   Specialty: Cardiology Why: Hospital follow-up with Cardiology scheduled for 03/11/2024 at 1:55pm. Please arrive 15 minutes early for check-in. If this date/ time does not work for you, please call our office to reschedule. Contact information: [address], [address] [place]  [address] [phone]        Jimmey Mould, MD Follow up in 1 week(s).   Specialty: Family Medicine Why: Check kidney function at PCP office in 1 week Contact information: [address] [place] [address] [phone]                Discharge Exam: Cleavon Curls Weights   02/14/24 0500 02/15/24 0701 02/16/24 0500  Weight: 67 kg 67 kg 65.2 kg   General-appears in no acute  distress Heart-S1-S2, regular, no murmur auscultated Lungs-clear to auscultation bilaterally, no wheezing or crackles auscultated Abdomen-soft, nontender, no organomegaly Extremities-no edema in the lower extremities Neuro-alert, oriented x3, no focal deficit noted  Condition at discharge: good  The results of significant diagnostics from this hospitalization (including imaging, microbiology, ancillary and laboratory) are listed below for reference.   Imaging Studies: ECHOCARDIOGRAM COMPLETE Result Date: 02/13/2024    ECHOCARDIOGRAM REPORT   Patient Name:   ALYSSON RESSLER Date of Exam: 02/13/2024 Medical Rec #:  [id number]       Height:       60.5 in Accession #:    [id number]      Weight:       144.2 lb Date of Birth:  1932/07/17       BSA:          1.634 m Patient Age:    89 years        BP:           133/38 mmHg Patient Gender: F               HR:           88 bpm. Exam Location:  Inpatient Procedure: 2D Echo, Cardiac Doppler and Color Doppler (Both Spectral and Color            Flow Doppler were utilized during procedure). Indications:    Dyspnea  History:        Patient has prior history of Echocardiogram examinations. CAD,                 Signs/Symptoms:Chest Pain; Risk Factors:Hypertension and Former                 Smoker.  Sonographer:    Willey Harrier Referring Phys: [id number] PROSPER M AMPONSAH IMPRESSIONS  1. Left ventricular ejection fraction, by estimation, is 55 to 60%. The left ventricle has normal function. The left ventricle has no regional wall motion abnormalities. Left ventricular diastolic parameters are consistent with Grade II diastolic dysfunction (pseudonormalization). Elevated left atrial pressure.  2. Right ventricular systolic function is normal. The right ventricular size is normal. There is moderately elevated pulmonary artery systolic pressure.  3. Left atrial size was severely dilated.  4. The mitral valve is normal in structure. Mild mitral valve regurgitation. No evidence  of mitral stenosis.  5. The aortic valve is normal in structure. There is moderate calcification of the aortic valve. Aortic valve regurgitation is not visualized. Aortic valve sclerosis/calcification is present, without  any evidence of aortic stenosis.  6. The inferior vena cava is dilated in size with <50% respiratory variability, suggesting right atrial pressure of 15 mmHg. Comparison(s): No significant change from prior study. FINDINGS  Left Ventricle: Left ventricular ejection fraction, by estimation, is 55 to 60%. The left ventricle has normal function. The left ventricle has no regional wall motion abnormalities. The left ventricular internal cavity size was normal in size. There is  no left ventricular hypertrophy. Abnormal (paradoxical) septal motion, consistent with left bundle branch block. Left ventricular diastolic parameters are consistent with Grade II diastolic dysfunction (pseudonormalization). Elevated left atrial pressure. Right Ventricle: The right ventricular size is normal. No increase in right ventricular wall thickness. Right ventricular systolic function is normal. There is moderately elevated pulmonary artery systolic pressure. The tricuspid regurgitant velocity is 2.99 m/s, and with an assumed right atrial pressure of 15 mmHg, the estimated right ventricular systolic pressure is 50.8 mmHg. Left Atrium: Left atrial size was severely dilated. Right Atrium: Right atrial size was normal in size. Pericardium: There is no evidence of pericardial effusion. Mitral Valve: The mitral valve is normal in structure. Mild mitral annular calcification. Mild mitral valve regurgitation. No evidence of mitral valve stenosis. MV peak gradient, 8.3 mmHg. The mean mitral valve gradient is 4.0 mmHg. Tricuspid Valve: The tricuspid valve is normal in structure. Tricuspid valve regurgitation is mild . No evidence of tricuspid stenosis. Aortic Valve: The aortic valve is normal in structure. There is moderate  calcification of the aortic valve. Aortic valve regurgitation is not visualized. Aortic valve sclerosis/calcification is present, without any evidence of aortic stenosis. Aortic valve peak gradient measures 12.0 mmHg. Pulmonic Valve: The pulmonic valve was normal in structure. Pulmonic valve regurgitation is not visualized. No evidence of pulmonic stenosis. Aorta: The aortic root is normal in size and structure. Venous: The inferior vena cava is dilated in size with less than 50% respiratory variability, suggesting right atrial pressure of 15 mmHg. IAS/Shunts: No atrial level shunt detected by color flow Doppler.  LEFT VENTRICLE PLAX 2D LVIDd:         4.80 cm   Diastology LVIDs:         3.40 cm   LV e' medial:    6.74 cm/s LV PW:         0.90 cm   LV E/e' medial:  21.2 LV IVS:        0.90 cm   LV e' lateral:   7.72 cm/s LVOT diam:     1.90 cm   LV E/e' lateral: 18.5 LV SV:         62 LV SV Index:   38 LVOT Area:     2.84 cm  RIGHT VENTRICLE            IVC RV Basal diam:  3.60 cm    IVC diam: 2.50 cm RV S prime:     8.38 cm/s TAPSE (M-mode): 2.0 cm LEFT ATRIUM             Index        RIGHT ATRIUM           Index LA Vol (A2C):   97.9 ml 59.90 ml/m  RA Area:     11.90 cm LA Vol (A4C):   98.9 ml 60.52 ml/m  RA Volume:   24.20 ml  14.81 ml/m LA Biplane Vol: 98.8 ml 60.45 ml/m  AORTIC VALVE AV Area (Vmax): 1.49 cm AV Vmax:  173.00 cm/s AV Peak Grad:   12.0 mmHg LVOT Vmax:      90.90 cm/s LVOT Vmean:     68.700 cm/s LVOT VTI:       0.219 m  AORTA Ao Root diam: 2.90 cm Ao Asc diam:  3.50 cm MITRAL VALVE                TRICUSPID VALVE MV Area (PHT): 3.13 cm     TR Peak grad:   35.8 mmHg MV Area VTI:   1.68 cm     TR Vmax:        299.00 cm/s MV Peak grad:  8.3 mmHg MV Mean grad:  4.0 mmHg     SHUNTS MV Vmax:       1.44 m/s     Systemic VTI:  0.22 m MV Vmean:      101.0 cm/s   Systemic Diam: 1.90 cm MV Decel Time: 242 msec MR Peak grad: 100.4 mmHg MR Vmax:      501.00 cm/s MV E velocity: 143.00 cm/s MV A  velocity: 132.00 cm/s MV E/A ratio:  1.08 Arta Lark Electronically signed by Arta Lark Signature Date/Time: 02/13/2024/10:45:21 AM    Final    DG Chest 1 View Result Date: 02/11/2024 CLINICAL DATA:  Confusion UTI EXAM: CHEST  1 VIEW COMPARISON:  02/11/2024, 10/27/2023, CT chest 10/19/2023, chest x-ray 03/11/2023 FINDINGS: Sternotomy. Chronic interstitial opacities, but with slight increased diffuse interstitial and ground-glass opacity similar compared to radiograph earlier this evening. Stable cardiomediastinal silhouette with aortic atherosclerosis. No pneumothorax. Possible trace effusion IMPRESSION: Chronic interstitial opacities with slight increased diffuse interstitial and ground-glass opacity similar compared to radiograph earlier this evening, possible mild edema superimposed on underlying chronic change. Possible trace effusions. Electronically Signed   By: Esmeralda Hedge M.D.   On: 02/11/2024 19:43   DG Chest Portable 1 View Result Date: 02/11/2024 CLINICAL DATA:  Cough. EXAM: PORTABLE CHEST 1 VIEW COMPARISON:  October 27, 2023. FINDINGS: Stable cardiomediastinal silhouette. Sternotomy wires are noted. Mild central pulmonary vascular congestion is noted. No consolidative process is noted. Bony thorax is unremarkable. IMPRESSION: Mild central pulmonary vascular congestion. Electronically Signed   By: Rosalene Colon M.D.   On: 02/11/2024 17:34   CT ABDOMEN PELVIS WO CONTRAST Result Date: 02/11/2024 CLINICAL DATA:  Acute abdominal pain.  UTI. EXAM: CT ABDOMEN AND PELVIS WITHOUT CONTRAST TECHNIQUE: Multidetector CT imaging of the abdomen and pelvis was performed following the standard protocol without IV contrast. RADIATION DOSE REDUCTION: This exam was performed according to the departmental dose-optimization program which includes automated exposure control, adjustment of the mA and/or kV according to patient size and/or use of iterative reconstruction technique. COMPARISON:  CT chest  abdomen and pelvis 03/11/2023. FINDINGS: Lower chest: There is patchy airspace disease in the right lower lobe. Hepatobiliary: No focal liver abnormality is seen. No gallstones, gallbladder wall thickening, or biliary dilatation. Pancreas: Unremarkable. No pancreatic ductal dilatation or surrounding inflammatory changes. Spleen: Normal in size without focal abnormality. Adrenals/Urinary Tract: Small posterior right bladder diverticulum is again seen. The bladder is otherwise within normal limits. The kidneys and adrenal glands are within normal limits. Stomach/Bowel: The stomach is distended with large air-fluid level. Small bowel and colon are nondilated. The appendix is not seen. No focal wall thickening or inflammation identified. There is a large amount of stool in the rectum. Vascular/Lymphatic: Aortic atherosclerosis. No enlarged abdominal or pelvic lymph nodes. Reproductive: Choose chilled Other: No abdominal wall hernia or abnormality. No abdominopelvic ascites. There  is a surgical clip in the left inguinal region. Musculoskeletal: the bones are osteopenic. Degenerative changes affect the spine. There is grade 1 anterolisthesis at L4-L5 which is unchanged. IMPRESSION: 1. No acute localizing process in the abdomen or pelvis. 2. Large amount of stool in the rectum. 3. Patchy airspace disease in the right lower lobe worrisome for pneumonia. 4. Aortic atherosclerosis. Aortic Atherosclerosis (ICD10-I70.0). Electronically Signed   By: Tyron Gallon M.D.   On: 02/11/2024 17:33    Microbiology: Results for orders placed or performed during the hospital encounter of 02/11/24  Culture, blood (routine x 2)     Status: Abnormal   Collection Time: 02/11/24  4:40 PM   Specimen: BLOOD  Result Value Ref Range Status   Specimen Description   Final    BLOOD LEFT ANTECUBITAL Performed at [facility], [address] [place], [place] [address]    Special Requests   Final    BOTTLES DRAWN  AEROBIC AND ANAEROBIC Blood Culture adequate volume Performed at [facility], [address], [place], [place] [address]    Culture  Setup Time   Final    GRAM POSITIVE COCCI IN PAIRS ANAEROBIC BOTTLE ONLY CRITICAL RESULT CALLED TO, READ BACK BY AND VERIFIED WITH: PHARMD TERRI GREEN ON 02/12/24 @ 1226 BY DRT Performed at [facility], [address] [place], [place] [address]    Culture STREPTOCOCCUS PNEUMONIAE (A)  Final   Report Status 02/14/2024 FINAL  Final   Organism ID, Bacteria STREPTOCOCCUS PNEUMONIAE  Final      Susceptibility   Streptococcus pneumoniae - MIC*    ERYTHROMYCIN <=0.12 SENSITIVE Sensitive     LEVOFLOXACIN 1 SENSITIVE Sensitive     VANCOMYCIN  0.5 SENSITIVE Sensitive     PENICILLIN (meningitis) <=0.06 SENSITIVE Sensitive     PENO - penicillin <=0.06      PENICILLIN (non-meningitis) <=0.06 SENSITIVE Sensitive     PENICILLIN (oral) <=0.06 SENSITIVE Sensitive     CEFTRIAXONE  (non-meningitis) <=0.12 SENSITIVE Sensitive     CEFTRIAXONE  (meningitis) <=0.12 SENSITIVE Sensitive     * STREPTOCOCCUS PNEUMONIAE  Blood Culture ID Panel (Reflexed)     Status: Abnormal   Collection Time: 02/11/24  4:40 PM  Result Value Ref Range Status   Enterococcus faecalis NOT DETECTED NOT DETECTED Final   Enterococcus Faecium NOT DETECTED NOT DETECTED Final   Listeria monocytogenes NOT DETECTED NOT DETECTED Final   Staphylococcus species NOT DETECTED NOT DETECTED Final   Staphylococcus aureus (BCID) NOT DETECTED NOT DETECTED Final   Staphylococcus epidermidis NOT DETECTED NOT DETECTED Final   Staphylococcus lugdunensis NOT DETECTED NOT DETECTED Final   Streptococcus species DETECTED (A) NOT DETECTED Final    Comment: CRITICAL RESULT CALLED TO, READ BACK BY AND VERIFIED WITH: PHARMD TERRI GREEN ON 02/12/24 @ 1226 BY DRT    Streptococcus agalactiae NOT DETECTED NOT DETECTED Final   Streptococcus pneumoniae DETECTED (A) NOT DETECTED Final    Comment: CRITICAL  RESULT CALLED TO, READ BACK BY AND VERIFIED WITH: PHARMD TERRI GREEN ON 02/12/24 @ 1226 BY DRT    Streptococcus pyogenes NOT DETECTED NOT DETECTED Final   A.calcoaceticus-baumannii NOT DETECTED NOT DETECTED Final   Bacteroides fragilis NOT DETECTED NOT DETECTED Final   Enterobacterales NOT DETECTED NOT DETECTED Final   Enterobacter cloacae complex NOT DETECTED NOT DETECTED Final   Escherichia coli NOT DETECTED NOT DETECTED Final   Klebsiella aerogenes NOT DETECTED NOT DETECTED Final   Klebsiella oxytoca NOT DETECTED NOT DETECTED Final   Klebsiella pneumoniae  NOT DETECTED NOT DETECTED Final   Proteus species NOT DETECTED NOT DETECTED Final   Salmonella species NOT DETECTED NOT DETECTED Final   Serratia marcescens NOT DETECTED NOT DETECTED Final   Haemophilus influenzae NOT DETECTED NOT DETECTED Final   Neisseria meningitidis NOT DETECTED NOT DETECTED Final   Pseudomonas aeruginosa NOT DETECTED NOT DETECTED Final   Stenotrophomonas maltophilia NOT DETECTED NOT DETECTED Final   Candida albicans NOT DETECTED NOT DETECTED Final   Candida auris NOT DETECTED NOT DETECTED Final   Candida glabrata NOT DETECTED NOT DETECTED Final   Candida krusei NOT DETECTED NOT DETECTED Final   Candida parapsilosis NOT DETECTED NOT DETECTED Final   Candida tropicalis NOT DETECTED NOT DETECTED Final   Cryptococcus neoformans/gattii NOT DETECTED NOT DETECTED Final    Comment: Performed at [facility], [address] [place], [place] [address]  Culture, blood (routine x 2)     Status: Abnormal   Collection Time: 02/11/24  4:50 PM   Specimen: BLOOD  Result Value Ref Range Status   Specimen Description   Final    BLOOD RIGHT ANTECUBITAL Performed at [facility], [address] [place], [place] [address]    Special Requests   Final    BOTTLES DRAWN AEROBIC AND ANAEROBIC Blood Culture adequate volume Performed at [facility], [address] [place], [place]  [address]    Culture  Setup Time   Final    GRAM POSITIVE COCCI IN PAIRS ANAEROBIC BOTTLE ONLY CRITICAL RESULT CALLED TO, READ BACK BY AND VERIFIED WITH: PHARMD TERRI GREEN ON 02/12/24 @ 1226 BY DRT    Culture (A)  Final    STREPTOCOCCUS PNEUMONIAE SUSCEPTIBILITIES PERFORMED ON PREVIOUS CULTURE WITHIN THE LAST 5 DAYS. Performed at [facility], [address] [place], [place] [address]    Report Status 02/14/2024 FINAL  Final  Resp panel by RT-PCR (RSV, Flu A&B, Covid)     Status: None   Collection Time: 02/11/24  5:18 PM   Specimen: Nasal Swab  Result Value Ref Range Status   SARS Coronavirus 2 by RT PCR NEGATIVE NEGATIVE Final    Comment: (NOTE) SARS-CoV-2 target nucleic acids are NOT DETECTED.  The SARS-CoV-2 RNA is generally detectable in upper respiratory specimens during the acute phase of infection. The lowest concentration of SARS-CoV-2 viral copies this assay can detect is 138 copies/mL. A negative result does not preclude SARS-Cov-2 infection and should not be used as the sole basis for treatment or other patient management decisions. A negative result may occur with  improper specimen collection/handling, submission of specimen other than nasopharyngeal swab, presence of viral mutation(s) within the areas targeted by this assay, and inadequate number of viral copies(<138 copies/mL). A negative result must be combined with clinical observations, patient history, and epidemiological information. The expected result is Negative.  Fact Sheet for Patients:  BloggerCourse.com  Fact Sheet for Healthcare Providers:  SeriousBroker.it  This test is no t yet approved or cleared by the [place]  FDA and  has been authorized for detection and/or diagnosis of SARS-CoV-2 by FDA under an Emergency Use Authorization (EUA). This EUA will remain  in effect (meaning this test can be used) for the duration of the COVID-19  declaration under Section 564(b)(1) of the Act, 21 U.S.C.section 360bbb-3(b)(1), unless the authorization is terminated  or revoked sooner.       Influenza A by PCR NEGATIVE NEGATIVE Final   Influenza B by PCR NEGATIVE NEGATIVE Final    Comment: (  NOTE) The Xpert Xpress SARS-CoV-2/FLU/RSV plus assay is intended as an aid in the diagnosis of influenza from Nasopharyngeal swab specimens and should not be used as a sole basis for treatment. Nasal washings and aspirates are unacceptable for Xpert Xpress SARS-CoV-2/FLU/RSV testing.  Fact Sheet for Patients: BloggerCourse.com  Fact Sheet for Healthcare Providers: SeriousBroker.it  This test is not yet approved or cleared by the [place]  FDA and has been authorized for detection and/or diagnosis of SARS-CoV-2 by FDA under an Emergency Use Authorization (EUA). This EUA will remain in effect (meaning this test can be used) for the duration of the COVID-19 declaration under Section 564(b)(1) of the Act, 21 U.S.C. section 360bbb-3(b)(1), unless the authorization is terminated or revoked.     Resp Syncytial Virus by PCR NEGATIVE NEGATIVE Final    Comment: (NOTE) Fact Sheet for Patients: BloggerCourse.com  Fact Sheet for Healthcare Providers: SeriousBroker.it  This test is not yet approved or cleared by the [place]  FDA and has been authorized for detection and/or diagnosis of SARS-CoV-2 by FDA under an Emergency Use Authorization (EUA). This EUA will remain in effect (meaning this test can be used) for the duration of the COVID-19 declaration under Section 564(b)(1) of the Act, 21 U.S.C. section 360bbb-3(b)(1), unless the authorization is terminated or revoked.  Performed at [facility], [address] [place], [place] [address]   MRSA Next Gen by PCR, Nasal     Status: None   Collection Time: 02/12/24   3:05 AM   Specimen: Nasal Mucosa; Nasal Swab  Result Value Ref Range Status   MRSA by PCR Next Gen NOT DETECTED NOT DETECTED Final    Comment: (NOTE) The GeneXpert MRSA Assay (FDA approved for NASAL specimens only), is one component of a comprehensive MRSA colonization surveillance program. It is not intended to diagnose MRSA infection nor to guide or monitor treatment for MRSA infections. Test performance is not FDA approved in patients less than 77 years old. Performed at [facility], [address] [place], [place] [address]   Culture, blood (Routine X 2) w Reflex to ID Panel     Status: None (Preliminary result)   Collection Time: 02/14/24 12:01 PM   Specimen: BLOOD  Result Value Ref Range Status   Specimen Description   Final    BLOOD SITE NOT SPECIFIED Performed at [facility], [address], [place], [place] [address]    Special Requests   Final    BOTTLES DRAWN AEROBIC ONLY Blood Culture results may not be optimal due to an inadequate volume of blood received in culture bottles Performed at [facility], [address] [place], [place] [address]    Culture   Final    NO GROWTH 2 DAYS Performed at [facility], [address] [place], [place] [address]    Report Status PENDING  Incomplete  Culture, blood (Routine X 2) w Reflex to ID Panel     Status: None (Preliminary result)   Collection Time: 02/14/24 12:14 PM   Specimen: BLOOD LEFT HAND  Result Value Ref Range Status   Specimen Description   Final    BLOOD LEFT HAND Performed at [facility], [address] [place], [place] [address]    Special Requests   Final    BOTTLES DRAWN AEROBIC ONLY Blood Culture results may not be optimal due to an inadequate volume of blood received in culture bottles Performed at [facility], [address] [place], [place] [address]    Culture   Final    NO GROWTH 2 DAYS Performed at  [facility], [address], [place], [place] [address]    Report Status PENDING  Incomplete    Labs: CBC: Recent Labs  Lab 02/11/24 1655 02/11/24 2046 02/13/24 0313 02/14/24 0312 02/15/24 0654  WBC 8.5  --  11.4* 7.3 6.3  NEUTROABS 6.2  --   --   --   --   HGB 11.2* 10.2* 9.0* 9.3* 10.2*  HCT 33.6* 30.0* 27.8* 29.2* 31.6*  MCV 99.4  --  102.6* 102.8* 102.9*  PLT 156  --  110* 117* 141*   Basic Metabolic Panel: Recent Labs  Lab 02/11/24 1655 02/11/24 2046 02/12/24 0431 02/13/24 0313 02/14/24 0312 02/15/24 0654  NA 140 140 138 135 135 139  K 4.2 3.6 3.4* 3.8 4.0 4.2  CL 101  --  105 103 102 104  CO2 25  --  23 22 23 24   GLUCOSE 123*  --  191* 116* 105* 95  BUN 32*  --  35* 41* 35* 27*  CREATININE 1.96*  --  1.93* 2.23* 1.77* 1.39*  CALCIUM  10.6*  --  9.2 9.2 8.9 9.8  MG  --   --  1.3* 2.5*  --   --   PHOS  --   --  1.7* 3.9  --   --    Liver Function Tests: Recent Labs  Lab 02/11/24 1655 02/13/24 0313  AST 27 22  ALT 11 11  ALKPHOS 79 42  BILITOT 0.4 0.9  PROT 7.1 5.7*  ALBUMIN  4.5 3.3*   CBG: No results for input(s): "GLUCAP" in the last 168 hours.  Discharge time spent: greater than 30 minutes.  Signed: Ozell Blunt, MD [facility] 02/16/2024

## 2024-02-16 NOTE — Progress Notes (Incomplete)
 [facility]  PROGRESS NOTE  Cindy [name] XBJ:[id number] DOB: 1932-05-10 DOA: 02/11/2024 PCP: Jimmey Mould, MD   Brief HPI:   89 y.o. F r COPD/ILD, chronic diastolic heart failure, CAD, GERD, HLD, HTN, allergic rhinitis, and chronic pain previous pneumonia and UTI presented on 02/11/24 at [facility] ED with confusion, rigors. In the ED negative and respiratory distress, cxr -Chronic interstitial opacities with slight increased diffuse interstitial and ground-glass opacity similar compared to radiograph earlier this evening, possible mild edema superimposed on underlying chronic changes. CTA abd pelvis> no active localizing process, large amount of stool in rectum, patchy airspace disease right LL worrisome for pneumonia. -She was tachycardic febrile with altered mental status and tachypnea and cough and met sepsis criteria source as pneumonia placed on IV antibiotics supplemental oxygen AND transferred from [facility] to [facility] for acute hypoxic respiratory failure in the setting of severe sepsis due to CAP and CHF exacerbation;initially onBipap then to [facility] with decreasing supplemental O2 requirements, subsequently down to  3 L [place] and mental status back to baseline.       Assessment/Plan:   Severe sepsis W/ Streptococcus pneumonia POA CAP-RLL Acute hypoxic respiratory failure due to pneumonia-resolved Acute metabolic encephalopathy due to sepsis-resolved: Presenting with AMS and found to pneumonia severe sepsis hypoxia > and bacteremia w/  Streptococcus pneumonia-likely pulmonary source. Hypoxia resolved, doing well on room air, hemodynamically stable, leukocytosis resolved. Seen by ID repeat blood culture 4/25 NGTD-if positive will need TEE, if remains negative on Sunday afternoon can discharge with 10 days course of Augmentin , Continue standard isolation precaution. Continue current ceftriaxone    Acute on chronic diastolic HF: Last TTE in May 2024 showed EF 55-60%, G1 DD, moderately elevated  PASP, and moderately dilated LA Cxr with mild central pulm congestion,BNP 1569-S/p iv iV Lasix  on admit subsequently held and s/p IVF 2/2 AKI with improvement in renal function TTE reviewed no acute changes- LVEF 55-60%, G2 DD elevated left atrial pressure moderately elevated PASP Monitor intake output Daily weight. Cont to hold spironolactone  and HCTZ. Her weight remains about the same as Jan    Wt Readings from Last 3 Encounters:  02/15/24 67 kg  11/21/23 67 kg  10/27/23 66 kg    AKI on CKD 3b: B/l creatinine around 1.3-1.5. s/p IV Lasix  for CHF on admit subsequently given IV fluids  Creatinine improved.   Continue holding diuretics, avoid nephrotoxic medication trend labs urine output               Recent Labs    10/21/23 0338 10/27/23 2051 10/29/23 0504 10/30/23 0514 10/31/23 0527 02/11/24 1655 02/11/24 2046 02/12/24 0431 02/13/24 0313 02/14/24 0312 02/15/24 0654  BUN 38* 41* 35* 34* 37* 32*  --  35* 41* 35* 27*  CREATININE 1.38* 1.66* 1.15* 1.28* 1.45* 1.96*  --  1.93* 2.23* 1.77* 1.39*  CO2 22 28 21* 19* 21* 25  --  23 22 23 24   K 3.8 4.2 3.6 3.7 4.3 4.2 3.6 3.4* 3.8 4.0 4.2    Hypomagnesemia Hypophosphatemia Hypokalemia: Resolved.   COPD/ILD: No wheezing on exam, respiratory status improving.  Continue Singulair  inhalers I-S flutter valve   HTN BP had been soft-mostly diastolic> BP trending up-metoprolol  resumed 4/25 Continue holding spironolactone ,hydrochlorothiazide -resume slowly IF BP trends up further   CAD HLD: Continue aspirin  and atorvastatin    PAC Possible wandering atrial pacemaker: ? A FIB Seen by cardiology at this time unclear, likely PAC -resumeed metoprolol  plan for outpatient Zio patch to monitor for A-fib  Hypothyroidism TSH ok, continue Synthroid    Iron deficiency anemia Stable, cont iron supplementation   Chronic pain Lumbar spondylosis: Continue tramadol , lidocaine  patch   Deconditioning/debility: Mobilize as able.    Goals of care: Discussed, at this time remains full code but does not want to be on prolonged ventilator    Medications     aspirin  EC  81 mg Oral Daily   atorvastatin   80 mg Oral QHS   calcium -vitamin D  1 tablet Oral BID   Chlorhexidine  Gluconate Cloth  6 each Topical Daily   ferrous sulfate   325 mg Oral Q breakfast   fluticasone  furoate-vilanterol  1 puff Inhalation Daily   guaiFENesin   400 mg Oral BID   heparin  injection (subcutaneous)  5,000 Units Subcutaneous Q8H   levothyroxine   175 mcg Oral QHS   lidocaine   1 patch Transdermal Daily   metoprolol  succinate  25 mg Oral Daily   montelukast   10 mg Oral Daily   pantoprazole   40 mg Oral BID   PreserVision AREDS 2  1 capsule Oral BID   traMADol   50 mg Oral BID     Data Reviewed:   CBG:  No results for input(s): "GLUCAP" in the last 168 hours.  SpO2: 98 % O2 Flow Rate (L/min): 2 L/min FiO2 (%): 21 %    Vitals:   02/15/24 1335 02/15/24 2106 02/16/24 0500 02/16/24 0548  BP: (!) 140/51 (!) 144/60  (!) 155/71  Pulse: 66 65  63  Resp: 18 16  16   Temp: 97.6 F (36.4 C) 98.2 F (36.8 C)  98.1 F (36.7 C)  TempSrc: Oral Oral  Oral  SpO2: 100% 98%  98%  Weight:   65.2 kg   Height:          Data Reviewed:  Basic Metabolic Panel: Recent Labs  Lab 02/11/24 1655 02/11/24 2046 02/12/24 0431 02/13/24 0313 02/14/24 0312 02/15/24 0654  NA 140 140 138 135 135 139  K 4.2 3.6 3.4* 3.8 4.0 4.2  CL 101  --  105 103 102 104  CO2 25  --  23 22 23 24   GLUCOSE 123*  --  191* 116* 105* 95  BUN 32*  --  35* 41* 35* 27*  CREATININE 1.96*  --  1.93* 2.23* 1.77* 1.39*  CALCIUM  10.6*  --  9.2 9.2 8.9 9.8  MG  --   --  1.3* 2.5*  --   --   PHOS  --   --  1.7* 3.9  --   --     CBC: Recent Labs  Lab 02/11/24 1655 02/11/24 2046 02/13/24 0313 02/14/24 0312 02/15/24 0654  WBC 8.5  --  11.4* 7.3 6.3  NEUTROABS 6.2  --   --   --   --   HGB 11.2* 10.2* 9.0* 9.3* 10.2*  HCT 33.6* 30.0* 27.8* 29.2* 31.6*  MCV 99.4  --   102.6* 102.8* 102.9*  PLT 156  --  110* 117* 141*    LFT Recent Labs  Lab 02/11/24 1655 02/13/24 0313  AST 27 22  ALT 11 11  ALKPHOS 79 42  BILITOT 0.4 0.9  PROT 7.1 5.7*  ALBUMIN  4.5 3.3*     Antibiotics: Anti-infectives (From admission, onward)    Start     Dose/Rate Route Frequency Ordered Stop   02/12/24 1400  cefTRIAXone  (ROCEPHIN ) 2 g in sodium chloride  0.9 % 100 mL IVPB        2 g 200 mL/hr over 30 Minutes Intravenous Every  24 hours 02/12/24 1247     02/12/24 1200  cefTRIAXone  (ROCEPHIN ) 1 g in sodium chloride  0.9 % 100 mL IVPB  Status:  Discontinued        1 g 200 mL/hr over 30 Minutes Intravenous Daily 02/12/24 1053 02/12/24 1246   02/12/24 1200  azithromycin  (ZITHROMAX ) tablet 500 mg  Status:  Discontinued        500 mg Oral Daily 02/12/24 1053 02/12/24 1248   02/11/24 1700  ceFEPIme  (MAXIPIME ) 2 g in sodium chloride  0.9 % 100 mL IVPB        2 g 200 mL/hr over 30 Minutes Intravenous  Once 02/11/24 1647 02/11/24 1758   02/11/24 1700  metroNIDAZOLE  (FLAGYL ) IVPB 500 mg        500 mg 100 mL/hr over 60 Minutes Intravenous  Once 02/11/24 1647 02/11/24 1724   02/11/24 1700  vancomycin  (VANCOCIN ) IVPB 1000 mg/200 mL premix        1,000 mg 200 mL/hr over 60 Minutes Intravenous  Once 02/11/24 1647 02/11/24 2157        DVT prophylaxis: ***  Code Status: ***  Family Communication: ***   CONSULTS ***   Subjective   ***   Objective    Physical Examination:   General:  *** Cardiovascular: *** Respiratory: *** Abdomen: *** Extremities: ***  Neurologic:  ***   Status is: Inpatient:  ***           Fitzhugh Vizcarrondo S Daniella Dewberry   [facility] If 7PM-7AM, please contact night-coverage at [email or web address], Office  [phone]   02/16/2024, 8:05 AM  LOS: 4 days

## 2024-02-16 NOTE — Progress Notes (Signed)
 [place] Chuck to be D/C'd Home per MD order.  Discussed with the patient and all questions fully answered.  VSS, Skin clean, dry and intact without evidence of skin break down, no evidence of skin tears noted. IV catheter discontinued intact. Site without signs and symptoms of complications. Dressing and pressure applied.  An After Visit Summary was printed and given to the patient. Patient prescriptions sent to pharmacy.  D/c education completed with patient/family including follow up instructions, medication list, d/c activities limitations if indicated, with other d/c instructions as indicated by MD - patient able to verbalize understanding, all questions fully answered.   Patient instructed to return to ED, call 911, or call MD for any changes in condition.   Patient escorted via WC, and D/C home via private auto.  Lonnell Rob 02/16/2024 2:27 PM

## 2024-02-17 ENCOUNTER — Other Ambulatory Visit: Payer: Self-pay | Admitting: Student

## 2024-02-17 ENCOUNTER — Ambulatory Visit: Payer: Medicare (Managed Care) | Attending: Student

## 2024-02-17 DIAGNOSIS — I499 Cardiac arrhythmia, unspecified: Secondary | ICD-10-CM

## 2024-02-17 DIAGNOSIS — I491 Atrial premature depolarization: Secondary | ICD-10-CM

## 2024-02-17 NOTE — Progress Notes (Unsigned)
Enrolled for [employer] to mail a ZIO XT long term holter monitor to the patients address on file.   Dr. Skains to read. 

## 2024-02-19 LAB — CULTURE, BLOOD (ROUTINE X 2)
Culture: NO GROWTH
Culture: NO GROWTH

## 2024-02-21 ENCOUNTER — Telehealth: Payer: Self-pay | Admitting: Cardiology

## 2024-02-21 NOTE — Telephone Encounter (Signed)
 Patient calling in about the heart monitor she received in the mail today. She would like for Callie to give her a call back since she saw her in the hospital. Please advise

## 2024-02-21 NOTE — Telephone Encounter (Signed)
 Spoke to patient appointment scheduled 5/6 at 10:00 am to have monitor put on.

## 2024-02-21 NOTE — Telephone Encounter (Signed)
 Spoke to patient she stated she received monitor in mail.Stated she needs help.She would like appointment to have put on in office Advised I will send message to monitor tech for appointment .

## 2024-02-25 ENCOUNTER — Ambulatory Visit: Payer: Medicare (Managed Care) | Attending: Cardiovascular Disease

## 2024-02-25 DIAGNOSIS — J189 Pneumonia, unspecified organism: Secondary | ICD-10-CM | POA: Diagnosis not present

## 2024-02-25 DIAGNOSIS — J3489 Other specified disorders of nose and nasal sinuses: Secondary | ICD-10-CM | POA: Diagnosis not present

## 2024-02-25 DIAGNOSIS — R790 Abnormal level of blood mineral: Secondary | ICD-10-CM | POA: Diagnosis not present

## 2024-02-25 DIAGNOSIS — Z6827 Body mass index (BMI) 27.0-27.9, adult: Secondary | ICD-10-CM | POA: Diagnosis not present

## 2024-02-25 DIAGNOSIS — M79673 Pain in unspecified foot: Secondary | ICD-10-CM | POA: Diagnosis not present

## 2024-02-25 DIAGNOSIS — A419 Sepsis, unspecified organism: Secondary | ICD-10-CM | POA: Diagnosis not present

## 2024-02-25 DIAGNOSIS — Z09 Encounter for follow-up examination after completed treatment for conditions other than malignant neoplasm: Secondary | ICD-10-CM | POA: Diagnosis not present

## 2024-02-25 DIAGNOSIS — R899 Unspecified abnormal finding in specimens from other organs, systems and tissues: Secondary | ICD-10-CM | POA: Diagnosis not present

## 2024-03-01 NOTE — Progress Notes (Addendum)
 Cardiology Office Note:    Date:  03/11/2024   ID:  Cindy [name], DOB 04-16-1932, MRN [id number]  PCP:  Jimmey Mould, MD  Cardiologist:  Dorothye Gathers, MD     Referring MD: Jimmey Mould, MD   Chief Complaint: hospital follow-up of irregular heart rhythm and CHF  History of Present Illness:    Cindy [name] is a 89 y.o. female with a history of CAD with remote MI s/p CABG x1 (SVG to RCA) 40981 (age 89) with subsequent DES x3, chronic diastolic CHF, hypertension, hyperlipidemia, COPD/ interstitial lung disease, hypothyroidism, iron deficiency anemia, GERD, and remote tobacco use (quit in the 1980s) who is followed by Dr. Renna Cary and presents today for hospital follow-up of irregular heart rhythm and CHF.  She has a history of remote MI in 26 at the age of 52 while in [place] .  She underwent a single-vessel CABG at that time with SVG to RCA.  She has since had 3 drug-eluting stents placed to native RCA and SVG to RCA graft.  She was admitted for unstable angina in 11/2018.  LHC at that time showed 100% stenosis of ostial to proximal native RCA and 100% in-stent restenosis of distal RCA as well as 100% in-stent restenosis of both the previously placed stents in the proximal and distal segment of SVG to RCA graft with left to right collaterals. No other CAD. Medical therapy was recommended.   Patient was last seen by Dr. Renna Cary in 01/2023 at which time she reported reported occasional chest pain. She was continued on medical therapy at that time. She was admitted in 02/2023 for UTI and was found to have an elevated troponin peaking at 387. Echo showed LVEF of 55-60% with mild hypokinesis of apical septal and anterior walls (possibly due to known LBBB) and grade 1 diastolic dysfunction, normal RV function with moderately elevated PASP, and mild to moderate MR. Troponin elevation was felt to be due to demand ischemia. She was not complaining of any angina. Medical therapy was recommended.    She was admitted in 09/2023 for acute hypoxic respiratory failure secondary to Influenza A bronchitis/ pneumonitis and then admitted again in 10/2023 for post-viral pneumonia.   She was then admitted in 01/2024 for severe sepsis and acute hypoxic respiratory failure secondary to community acquired pneumonia as well as acute on chronic CHF. Echo showed LVEF of 55-60% with normal wall motion and grade 2 diastolic dysfunction, normal RV function with moderately elevated PASP, severe biatrial enlargement, and mild MR. She was treated with antibiotics and a couple of doses of IV Lasix . Cardiology was consulted for new onset atrial fibrillation. However, upon our review of EKG and telemetry, there was no [name] atrial fibrillation. EKG and telemetry showed frequent PACs and possible intermittent wandering atrial pacemaker. There were some episodes with a lot of underlying artifact where it was difficult to see the P the waves and the QRS complexes were regular at that those times. She was not started on anticoagulation. Outpatient monitor was order to assess for any true atrial fibrillation. Hospitalization was complicated by hypotension and AKI. Home BP medications were held and she required a couple of doses of Midodrine  during admission. However, BP and renal function improved prior to discharge and home medications were able to be resumed.   Patient presents today for follow-up. She is here with her husband. She has done well since being discharged from the hospital. She celebrated her 89th birthday yesterday with her family. She  denies any shortness of breath and No chest pain, orthopnea, PND, palpitations, lightheadedness, dizziness, or syncope. She has some mild lower extremity edema but this is stable. Her BP is elevated in the office today at 171/71 and then 180/70. However, she states BP is usually in the 140s/70s.   We helped remove her Zio monitor in the office today and will help mail this back for  her.  EKGs/Labs/Other Studies Reviewed:    The following studies were reviewed:  Left Cardiac Catheterization 11/27/2018: Dist RCA lesion is 100% stenosed. Ost RPDA to RPDA lesion is 100% stenosed. Post Atrio lesion is 100% stenosed. Prox RCA to Mid RCA lesion is 100% stenosed. Origin to Prox Graft lesion is 100% stenosed. Mid Graft to Insertion lesion is 100% stenosed. The left ventricular systolic function is normal. LV end diastolic pressure is normal. The left ventricular ejection fraction is 55-65% by visual estimate.  Impressions: Cindy [name] has an occluded dominant RCA, occluded RCA vein graft with left-to-right collaterals and normal LV function. Medical therapy will be recommended. A minx closure device was successfully deployed. The patient left lab in stable condition. She will be gently hydrated and most likely discharge home tomorrow on antianginal medications.   Diagnostic Dominance: Right  _______________  Carotid Dopplers 01/23/2019: Summary:  - Right Carotid: Velocities in the right ICA are consistent with a 1-39%  stenosis. The RICA velocities remain within normal range and are  stable compared to the prior exam.  - Left Carotid: Velocities in the left ICA are consistent with a 40-59%  stenosis, based on peak systolic velocities and plaque formation. The  ECA appears >50% stenosed. The LICA velocities are elevated and  have increased compared to the prior exam.  - Vertebrals:  Bilateral vertebral arteries demonstrate antegrade flow.  - Subclavians: Left subclavian artery was stenotic. Normal flow hemodynamics  were seen in the right subclavian artery.  _______________  Echocardiogram 02/13/2024: Impressions: 1. Left ventricular ejection fraction, by estimation, is 55 to 60%. The  left ventricle has normal function. The left ventricle has no regional  wall motion abnormalities. Left ventricular diastolic parameters are  consistent with Grade II diastolic   dysfunction (pseudonormalization). Elevated left atrial pressure.   2. Right ventricular systolic function is normal. The right ventricular  size is normal. There is moderately elevated pulmonary artery systolic  pressure.   3. Left atrial size was severely dilated.   4. The mitral valve is normal in structure. Mild mitral valve  regurgitation. No evidence of mitral stenosis.   5. The aortic valve is normal in structure. There is moderate  calcification of the aortic valve. Aortic valve regurgitation is not  visualized. Aortic valve sclerosis/calcification is present, without any  evidence of aortic stenosis.   6. The inferior vena cava is dilated in size with <50% respiratory  variability, suggesting right atrial pressure of 15 mmHg.   Comparison(s): No significant change from prior study.  _______________   EKG:  EKG ordered today.   EKG Interpretation Date/Time:  Wednesday Mar 11 2024 13:52:37 EDT Ventricular Rate:  58 PR Interval:  192 QRS Duration:  144 QT Interval:  456 QTC Calculation: 447 R Axis:   -6  Text Interpretation: Sinus bradycardia Left bundle branch block  No acute ischemic changes Confirmed by Sharren Decree [phone]) on 03/11/2024 2:28:22 PM    Recent Labs: 02/11/2024: Pro Brain Natriuretic Peptide 1,860.0 02/12/2024: B Natriuretic Peptide 1,569.3 02/13/2024: ALT 11; Magnesium  2.5; TSH 1.569 02/15/2024: BUN 27; Creatinine, Ser 1.39; Hemoglobin  10.2; Platelets 141; Potassium 4.2; Sodium 139  Recent Lipid Panel    Component Value Date/Time   CHOL 164 04/14/2019 1055   TRIG 191 (H) 04/14/2019 1055   HDL 43 04/14/2019 1055   CHOLHDL 3.8 04/14/2019 1055   LDLCALC 83 04/14/2019 1055    Physical Exam:    Vital Signs: BP (!) 180/70   Pulse 61   Ht 5' (1.524 m)   Wt 144 lb (65.3 kg)   LMP  (LMP Unknown)   SpO2 93%   BMI 28.12 kg/m     Wt Readings from Last 3 Encounters:  03/11/24 144 lb (65.3 kg)  02/16/24 143 lb 11.8 oz (65.2 kg)  11/21/23 147 lb  11.2 oz (67 kg)     General: 89 y.o. Caucasian female in no acute distress. HEENT: Normocephalic and atraumatic. Sclera [name].  Neck: Supple. No JVD. Heart: RRR. II/VI systolic murmur.   Lungs: No increased work of breathing. Course crackles noted in bilateral bases consistent with interstitial lung disease.  Extremities: Trace lower extremity edema bilaterally.  Skin: Warm and dry. Neuro: No focal deficits. Psych: Normal affect. Responds appropriately.  Assessment:    1. Premature atrial contractions   2. Essential hypertension   3. Chronic diastolic CHF (congestive heart failure) (HCC)   4. Coronary artery disease involving native coronary artery of native heart without angina pectoris   5. Bilateral carotid artery stenosis   6. Primary hypertension   7. Hyperlipidemia, unspecified hyperlipidemia type   8. Stage 3b chronic kidney disease (HCC)     Plan:    PACs  Possible Wandering Atrial Pacemaker Patient was recently admitted in 01/2024 for severe sepsis, acute hypoxic respiratory failure, and acute metabolic encephalopathy secondary to pneumonia. There was initially concern for possible atrial fibrillation. However, EKG and telemetry were reviewed by Cardiology and there was no [name] atrial fibrillation. EKG and telemetry showed frequent PACs and possible intermittent wandering atrial pacemaker. Outpatient monitor was ordered but is still pending. - EKG today shows sinus bradycardia with rate of 58bpm. - Continue Toprol -XL 25mg  daily.  - CHA2DS2-VASc = 6 (CAD, CHF, HTN, age x2, female). Will hold off on anticoagulation unless she has [name] atrial fibrillation. Will await monitor results.   Chronic Diastolic CHF Echo in 01/2024 showed LVEF of 55-60% with normal wall motion, grade 2 diastolic dysfunction, and elevated left atrial pressure as well as normal RV function with moderately elevated PASP.  - Euvolemic on exam.  - Continue Losartan  25mg  daily. - Continue Toprol -XL 25mg   daily. - Continue Spironolactone -HCTZ 25-25mg  daily.   - No SGLT2 inhibitor given history of UTIs.    CAD s/p CABG S/p remote CABG x1 (SVG to RCA) in 1981 and multiple DES. Last cath in 2020 showed occluded native RCA and occluded SVG to RCA with left to right collaterals. No other CAD.  - No chest pain.  - Continue Imdur  90mg  daily and Toprol -XL 25mg  daily.  - Continue aspirin  and statin.   Carotid Stenosis Carotid dopplers in 01/2019 showed 40-59% stenosis of left ICA and 1-39% stenosis of right ICA.  - Continue aspirin  and statin.    Hypertension BP elevated. Initially 171/71 and then 180/70 on my personal recheck at the end of the visit. However, patient/ husband states BP is typically in the 140s/70s at home. - Continue current medications: Spironolactone -HCTZ 25-25mg  daily, Imdur  90mg  daily, Losartan  25mg  daily, and Toprol -XL 25mg  daily.  - Asked patient to recheck BP when she gets home and let us  know if  systolic BP >170.  Also asked patient to keep BP/HR log for 1 week. Given her advanced age, I would be okay with her BP being <150/90. If we need any medications changes, will likely increase Losartan  to 50mg  daily assuming renal function stable.   Hyperlipidemia Lipid panel in 11/2023 (per [facility]): Total Cholesterol 146, Triglycerides 195, HDL  48, LDL 66. - Continue Lipitor  80mg  daily.    CKD Stage IIIb Baseline creatine around 1.2 to 1.4 Creatinine peaked at 2.23 during recent admission but improved to 1.39 on discharge.  - Will repeat BMET today.   Disposition: Follow up in 3 months.   Signed, Cindy Clear, PA-C  03/11/2024 3:30 PM    [facility]

## 2024-03-11 ENCOUNTER — Ambulatory Visit: Payer: Medicare (Managed Care) | Attending: Student | Admitting: Student

## 2024-03-11 ENCOUNTER — Encounter: Payer: Self-pay | Admitting: Student

## 2024-03-11 VITALS — BP 180/70 | HR 61 | Ht 60.0 in | Wt 144.0 lb

## 2024-03-11 DIAGNOSIS — I491 Atrial premature depolarization: Secondary | ICD-10-CM | POA: Diagnosis not present

## 2024-03-11 DIAGNOSIS — I10 Essential (primary) hypertension: Secondary | ICD-10-CM

## 2024-03-11 DIAGNOSIS — I5032 Chronic diastolic (congestive) heart failure: Secondary | ICD-10-CM | POA: Diagnosis not present

## 2024-03-11 DIAGNOSIS — N1832 Chronic kidney disease, stage 3b: Secondary | ICD-10-CM

## 2024-03-11 DIAGNOSIS — E785 Hyperlipidemia, unspecified: Secondary | ICD-10-CM

## 2024-03-11 DIAGNOSIS — I2510 Atherosclerotic heart disease of native coronary artery without angina pectoris: Secondary | ICD-10-CM

## 2024-03-11 DIAGNOSIS — I6523 Occlusion and stenosis of bilateral carotid arteries: Secondary | ICD-10-CM

## 2024-03-11 NOTE — Patient Instructions (Addendum)
 Medication Instructions:  Continue all medications *If you need a refill on your cardiac medications before your next appointment, please call your pharmacy*  Lab Work: Bmet today  Testing/Procedures: None ordered  Follow-Up: At [facility], you and your health needs are our priority.  As part of our continuing mission to provide you with exceptional heart care, our providers are all part of one team.  This team includes your primary Cardiologist (physician) and Advanced Practice Providers or APPs (Physician Assistants and Nurse Practitioners) who all work together to provide you with the care you need, when you need it.  Your next appointment:  3 months   Tuesday  8/19 at 1:55 pm    Provider:  Callie Goodrich PA       Check Blood Pressure and pulse daily for 1 week    Call office to report readings  Call sooner If blood pressure greater than 170     We recommend signing up for the patient portal called "MyChart".  Sign up information is provided on this After Visit Summary.  MyChart is used to connect with patients for Virtual Visits (Telemedicine).  Patients are able to view lab/test results, encounter notes, upcoming appointments, etc.  Non-urgent messages can be sent to your provider as well.   To learn more about what you can do with MyChart, go to [email or web address].

## 2024-03-12 ENCOUNTER — Telehealth: Payer: Self-pay | Admitting: Cardiology

## 2024-03-12 NOTE — Telephone Encounter (Signed)
 Spoke with spouse and he states they went to get labs done yesterday and the lab was closed. They will come to get labs done on Tuesday.

## 2024-03-12 NOTE — Telephone Encounter (Signed)
 Patient was seen on 03/11/24 and was going to get their labs that day, but [facility] was closed. Patient is wanting to know if it would be too late for them to wait until 03/17/24 to have labs drawn. Please advise.

## 2024-03-17 DIAGNOSIS — N1832 Chronic kidney disease, stage 3b: Secondary | ICD-10-CM | POA: Diagnosis not present

## 2024-03-18 ENCOUNTER — Ambulatory Visit: Payer: Self-pay | Admitting: Student

## 2024-03-18 DIAGNOSIS — Z79899 Other long term (current) drug therapy: Secondary | ICD-10-CM

## 2024-03-18 DIAGNOSIS — I10 Essential (primary) hypertension: Secondary | ICD-10-CM

## 2024-03-18 DIAGNOSIS — N1832 Chronic kidney disease, stage 3b: Secondary | ICD-10-CM

## 2024-03-18 LAB — BASIC METABOLIC PANEL WITH GFR
BUN/Creatinine Ratio: 18 (ref 12–28)
BUN: 32 mg/dL (ref 10–36)
CO2: 20 mmol/L (ref 20–29)
Calcium: 10.8 mg/dL — ABNORMAL HIGH (ref 8.7–10.3)
Chloride: 104 mmol/L (ref 96–106)
Creatinine, Ser: 1.82 mg/dL — ABNORMAL HIGH (ref 0.57–1.00)
Glucose: 97 mg/dL (ref 70–99)
Potassium: 5 mmol/L (ref 3.5–5.2)
Sodium: 141 mmol/L (ref 134–144)
eGFR: 26 mL/min/{1.73_m2} — ABNORMAL LOW (ref >59)

## 2024-03-18 NOTE — Telephone Encounter (Signed)
 Called and left voice message to call back

## 2024-03-18 NOTE — Telephone Encounter (Signed)
-----   Message from Casimer Clear sent at 03/18/2024  1:08 PM EDT ----- Please notify patient of results: Creatinine (which measures kidney function) is back up which is a sign of some mild kidney injury. I would recommend she stop her Spironolactone -HCTZ and we can start Lasix  40mg  AS NEEDED for weight gain (3lbs in 1 day or 5lbs in 1 week) or worsening edema. I think she is also going to need something else for her BP with stopping her Spironolactone -HCTZ so I would recommend we start Amlodipine 5mg  daily. She will need a repeat BMET next Monday (6/2). Can we also please see how her BP is doing? (It was elevated when she was in the office with me last week)  Thank you!

## 2024-03-20 DIAGNOSIS — I499 Cardiac arrhythmia, unspecified: Secondary | ICD-10-CM | POA: Diagnosis not present

## 2024-03-20 DIAGNOSIS — I491 Atrial premature depolarization: Secondary | ICD-10-CM | POA: Diagnosis not present

## 2024-03-20 NOTE — Telephone Encounter (Signed)
Patient's husband is returning call. 

## 2024-03-22 ENCOUNTER — Encounter: Payer: Self-pay | Admitting: Cardiology

## 2024-03-23 NOTE — Telephone Encounter (Signed)
 Please review and advise.

## 2024-03-24 MED ORDER — FUROSEMIDE 40 MG PO TABS: 40.0000 mg | ORAL_TABLET | Freq: Every day | ORAL | 2 refills | Status: AC | PRN

## 2024-03-24 MED ORDER — AMLODIPINE BESYLATE 5 MG PO TABS: 5.0000 mg | ORAL_TABLET | Freq: Every day | ORAL | 3 refills | Status: AC

## 2024-03-24 NOTE — Telephone Encounter (Signed)
 Discussed lab results and recommendations with patient.  Husband also on phone, states he sent BP readings via MyChart, these were forwarded to Callie for review on 03/23/24.  Patient also inquired about her heart monitor results. Not read by provider yet, comment states to be read by Dr. Renna Cary.   Letter mailed to patient with results and medication changes per patient's request.  Will forward to Dr. Renna Cary and Callie to review and follow-up on heart monitor results.

## 2024-03-24 NOTE — Telephone Encounter (Signed)
 Reviewed log. BP still mildly elevated at times. I see the telephone note from today where she was made aware of new medication recommendations based on most recent lab results. Let's continue with those recommendations and then have her keep another log of her BP for about 1 week.   The formal results from recent Zio monitor are still pending. However, preliminary results shows short episodes of SVT (a fast heart rhythm coming from the top of the heart) with longest episode lasting 16.1 seconds and occasional premature beats but no atrial fibrillation. I will let her know if formal results show anything different.   Thank you!

## 2024-03-24 NOTE — Telephone Encounter (Signed)
-----   Message from Nurse Altha Jester sent at 03/23/2024  5:21 PM EDT -----  ----- Message ----- From: Goodrich, Callie E, PA-C Sent: 03/18/2024   1:08 PM EDT To: [facility] Triage  Please notify patient of results: Creatinine (which measures kidney function) is back up which is a sign of some mild kidney injury. I would recommend she stop her Spironolactone -HCTZ and we can start Lasix  40mg  AS NEEDED for weight gain (3lbs in 1 day or 5lbs in 1 week) or worsening edema. I think she is also going to need something else for her BP with stopping her Spironolactone -HCTZ so I would recommend we start Amlodipine 5mg  daily. She will need a repeat BMET next Monday (6/2). Can we also please see how her BP is doing? (It was elevated when she was in the office with me last week)  Thank you!

## 2024-03-24 NOTE — Addendum Note (Signed)
 Addended by: Luwana Salvo on: 03/24/2024 09:32 AM   Modules accepted: Orders

## 2024-03-25 DIAGNOSIS — L821 Other seborrheic keratosis: Secondary | ICD-10-CM | POA: Diagnosis not present

## 2024-03-25 DIAGNOSIS — L570 Actinic keratosis: Secondary | ICD-10-CM | POA: Diagnosis not present

## 2024-03-25 DIAGNOSIS — D044 Carcinoma in situ of skin of scalp and neck: Secondary | ICD-10-CM | POA: Diagnosis not present

## 2024-03-27 DIAGNOSIS — I499 Cardiac arrhythmia, unspecified: Secondary | ICD-10-CM

## 2024-03-27 DIAGNOSIS — I491 Atrial premature depolarization: Secondary | ICD-10-CM

## 2024-03-30 ENCOUNTER — Ambulatory Visit: Payer: Self-pay | Admitting: Student

## 2024-03-31 DIAGNOSIS — Z79899 Other long term (current) drug therapy: Secondary | ICD-10-CM | POA: Diagnosis not present

## 2024-03-31 DIAGNOSIS — I10 Essential (primary) hypertension: Secondary | ICD-10-CM | POA: Diagnosis not present

## 2024-03-31 DIAGNOSIS — N1832 Chronic kidney disease, stage 3b: Secondary | ICD-10-CM | POA: Diagnosis not present

## 2024-04-01 LAB — BASIC METABOLIC PANEL WITH GFR
BUN/Creatinine Ratio: 22 (ref 12–28)
BUN: 34 mg/dL (ref 10–36)
CO2: 19 mmol/L — ABNORMAL LOW (ref 20–29)
Calcium: 10.2 mg/dL (ref 8.7–10.3)
Chloride: 103 mmol/L (ref 96–106)
Creatinine, Ser: 1.57 mg/dL — ABNORMAL HIGH (ref 0.57–1.00)
Glucose: 90 mg/dL (ref 70–99)
Potassium: 5 mmol/L (ref 3.5–5.2)
Sodium: 140 mmol/L (ref 134–144)
eGFR: 31 mL/min/{1.73_m2} — ABNORMAL LOW (ref >59)

## 2024-04-08 DIAGNOSIS — J479 Bronchiectasis, uncomplicated: Secondary | ICD-10-CM | POA: Diagnosis not present

## 2024-04-08 DIAGNOSIS — R059 Cough, unspecified: Secondary | ICD-10-CM | POA: Diagnosis not present

## 2024-04-08 DIAGNOSIS — Z6827 Body mass index (BMI) 27.0-27.9, adult: Secondary | ICD-10-CM | POA: Diagnosis not present

## 2024-04-08 DIAGNOSIS — R944 Abnormal results of kidney function studies: Secondary | ICD-10-CM | POA: Diagnosis not present

## 2024-04-17 NOTE — Telephone Encounter (Signed)
 Overall, BP looks good. Continue current medications.  Thank you!

## 2024-05-30 NOTE — Progress Notes (Signed)
 Cardiology Office Note:    Date:  06/09/2024   ID:  Cindy [name], DOB Apr 17, 1932, MRN [id number]  PCP:  Okey Carlin Redbird, MD  Cardiologist:  Oneil Parchment, MD     Referring MD: Okey Carlin Redbird, MD   Chief Complaint: follow-up of CAD, CHF, HTN  History of Present Illness:    Cindy [name] is a 89 y.o. female with a history of CAD with remote MI s/p CABG x1 (SVG to RCA) 16 (age 41) with subsequent DES x3, chronic diastolic CHF, paroxysmal SVT and PACs noted on monitor in 02/2024, hypertension, hyperlipidemia, COPD/ interstitial lung disease, hypothyroidism, iron deficiency anemia, GERD, and remote tobacco use (quit in the 1980s) who is followed by Dr. Parchment and presents today for routine follow-up.   She has a history of remote MI in 53 at the age of 67 while in [place] . She underwent a single-vessel CABG at that time with SVG to RCA. She has since had 3 drug-eluting stents placed to native RCA and SVG to RCA graft. She was admitted for unstable angina in 11/2018. LHC at that time showed 100% stenosis of ostial to proximal native RCA and 100% in-stent restenosis of distal RCA as well as 100% in-stent restenosis of both the previously placed stents in the proximal and distal segment of SVG to RCA graft with left to right collaterals. No other CAD. Medical therapy was recommended.   She was admitted in 01/2024 for  severe sepsis and acute hypoxic respiratory failure secondary to community acquired pneumonia as well as acute on chronic CHF. Echo showed LVEF of 55-60% with normal wall motion and grade 2 diastolic dysfunction, normal RV function with moderately elevated PASP, severe biatrial enlargement, and mild MR. She was treated with antibiotics and a couple of doses of IV Lasix . Cardiology was consulted for new onset atrial fibrillation. However, upon our review of EKG and telemetry, there was no clear atrial fibrillation. EKG and telemetry showed frequent PACs and possible intermittent  wandering atrial pacemaker. There were some episodes with a lot of underlying artifact where it was difficult to see the P the waves and the QRS complexes were regular at that those times. She was not started on anticoagulation. Outpatient monitor was ordered and showed frequent episodes of SVT (longest run 16 seconds) and occasional PACs but no atrial fibrillation or significant arrhythmias.   She was last seen by me in 02/2024 at which time she was doing well since recent admission and denied any chest pain, shortness of breath, or palpitations. Her BP was elevated in the office but she reported it was generally well controlled at home. BMET showed some mild AKI so Spironolactone -HCTZ was stopped and she was started on Amlodipine  and PRN Lasix  instead.   Patient presents today for follow-up.  Here with her husband.  Overall, she has done well since last visit.  She does report some lower extremity edema.  She states it was really bad 2 months ago and extended up to her thighs to the point where she had trouble putting on her pants.  However, it has since improved.  She takes Lasix  as needed.  It sounds like she probably gets a fair amount of sodium in her diet.  Weight stable at home.  Otherwise, no cardiac complaints.  No chest pain, shortness of breath, orthopnea, PND, palpitations, lightheadedness/dizziness, syncope.  She states she saw her Nephrologist earlier today. Unable to see these notes but she states they told her she was in  between stage III and IV CKD. No changes were recommended at this visit. Patient states they told her she could increase her Lasix  to daily use until edema is gone.   EKGs/Labs/Other Studies Reviewed:    The following studies were reviewed:   Left Cardiac Catheterization 11/27/2018: Dist RCA lesion is 100% stenosed. Ost RPDA to RPDA lesion is 100% stenosed. Post Atrio lesion is 100% stenosed. Prox RCA to Mid RCA lesion is 100% stenosed. Origin to Prox Graft lesion is  100% stenosed. Mid Graft to Insertion lesion is 100% stenosed. The left ventricular systolic function is normal. LV end diastolic pressure is normal. The left ventricular ejection fraction is 55-65% by visual estimate.   Impressions: Ms. Cindy [name] has an occluded dominant RCA, occluded RCA vein graft with left-to-right collaterals and normal LV function. Medical therapy will be recommended. A minx closure device was successfully deployed. The patient left lab in stable condition. She will be gently hydrated and most likely discharge home tomorrow on antianginal medications.    Diagnostic Dominance: Right  _______________   Carotid Dopplers 01/23/2019: Summary:  - Right Carotid: Velocities in the right ICA are consistent with a 1-39%  stenosis. The RICA velocities remain within normal range and are  stable compared to the prior exam.  - Left Carotid: Velocities in the left ICA are consistent with a 40-59%  stenosis, based on peak systolic velocities and plaque formation. The  ECA appears >50% stenosed. The LICA velocities are elevated and  have increased compared to the prior exam.  - Vertebrals:  Bilateral vertebral arteries demonstrate antegrade flow.  - Subclavians: Left subclavian artery was stenotic. Normal flow hemodynamics  were seen in the right subclavian artery.  _______________   Echocardiogram 02/13/2024: Impressions: 1. Left ventricular ejection fraction, by estimation, is 55 to 60%. The  left ventricle has normal function. The left ventricle has no regional  wall motion abnormalities. Left ventricular diastolic parameters are  consistent with Grade II diastolic  dysfunction (pseudonormalization). Elevated left atrial pressure.   2. Right ventricular systolic function is normal. The right ventricular  size is normal. There is moderately elevated pulmonary artery systolic  pressure.   3. Left atrial size was severely dilated.   4. The mitral valve is normal in structure.  Mild mitral valve  regurgitation. No evidence of mitral stenosis.   5. The aortic valve is normal in structure. There is moderate  calcification of the aortic valve. Aortic valve regurgitation is not  visualized. Aortic valve sclerosis/calcification is present, without any  evidence of aortic stenosis.   6. The inferior vena cava is dilated in size with <50% respiratory  variability, suggesting right atrial pressure of 15 mmHg.   Comparison(s): No significant change from prior study.  _______________  Monitor 02/25/2024 to 03/10/2024:   Sinus rhythm average heart rate 59 bpm with right bundle branch block   Frequent supraventricular tachycardia episodes longest lasting 16 seconds with rates ranging from 100-150 compatible with atrial tachycardia.   Occasional PACs, 2.2% noted   Rare PVCs   No evidence of atrial fibrillation.  EKG:  EKG not ordered today.   Recent Labs: 02/11/2024: Pro Brain Natriuretic Peptide 1,860.0 02/12/2024: B Natriuretic Peptide 1,569.3 02/13/2024: ALT 11; Magnesium  2.5; TSH 1.569 02/15/2024: Hemoglobin 10.2; Platelets 141 03/31/2024: BUN 34; Creatinine, Ser 1.57; Potassium 5.0; Sodium 140  Recent Lipid Panel    Component Value Date/Time   CHOL 164 04/14/2019 1055   TRIG 191 (H) 04/14/2019 1055   HDL 43 04/14/2019 1055  CHOLHDL 3.8 04/14/2019 1055   LDLCALC 83 04/14/2019 1055    Physical Exam:    Vital Signs: BP (!) 150/60 (BP Location: Left Arm, Patient Position: Sitting, Cuff Size: Normal)   Pulse 62   Ht 5' (1.524 m)   Wt 143 lb (64.9 kg)   LMP  (LMP Unknown)   BMI 27.93 kg/m     Wt Readings from Last 3 Encounters:  06/09/24 143 lb (64.9 kg)  03/11/24 144 lb (65.3 kg)  02/16/24 143 lb 11.8 oz (65.2 kg)     General: 89 y.o. female in no acute distress. HEENT: Normocephalic and atraumatic. Sclera clear.  Neck: Supple. Very soft left carotid bruit. No JVD. Heart: RRR. Distinct S1 and S2. Faint systolic murmur. No murmurs, gallops, or rubs.   Lungs: No increased work of breathing. Clear to ausculation bilaterally. No wheezes, rhonchi, or rales.  Extremities: Mild lower extremity edema bilaterally.  Skin: Warm and dry. Neuro: No focal deficits. Psych: Normal affect. Responds appropriately.   Assessment:    1. PAC (premature atrial contraction)   2. Paroxysmal SVT (supraventricular tachycardia) (HCC)   3. Chronic diastolic CHF (congestive heart failure) (HCC)   4. Coronary artery disease involving native coronary artery of native heart without angina pectoris   5. S/P CABG (coronary artery bypass graft)   6. Bilateral carotid artery stenosis   7. Primary hypertension   8. Hyperlipidemia, unspecified hyperlipidemia type   9. Stage 3b chronic kidney disease (HCC)     Plan:    Frequent PACs  Paroxysmal SVT Patient was 01/2024 for severe sepsis and acute hypoxic respiratory failure  secondary to pneumonia. There was initially concern for possible atrial fibrillation. However, EKG and telemetry were reviewed by Cardiology and there was no clear atrial fibrillation. EKG and telemetry showed frequent PACs and possible intermittent wandering atrial pacemaker. Outpatient monitor was ordered and showed frequent episodes of SVT (longest run 16 seconds) and occasional PACs but no atrial fibrillation or significant arrhythmias.  - Maintaining sinus rhythm on exam.  - No palpitations.  - Continue Toprol -XL 25mg  daily.    Chronic Diastolic CHF Echo in 01/2024 showed LVEF of 55-60% with normal wall motion, grade 2 diastolic dysfunction, and elevated left atrial pressure as well as normal RV function with moderately elevated PASP.  - She has some mild lower extremity edema. No other signs of volume overload. Weights stable. - Currently on Lasix  40mg  as needed for weight gain or edema. Will have her take daily for the next 3 days to help with lower extremity edema and then go back to PRN dosing. - Continue Losartan  25mg  daily. - Continue  Toprol -XL 25mg  daily. - Previously on Spironolactone -HCTZ but this was stopped due to renal function.  - No SGLT2 inhibitor given history of UTIs.    CAD s/p CABG S/p remote CABG x1 (SVG to RCA) in 1981 and multiple DES. Last cath in 2020 showed occluded native RCA and occluded SVG to RCA with left to right collaterals. No other CAD.  - No chest pain.  - Continue Imdur  90mg  daily and Toprol -XL 25mg  daily.  - Continue aspirin  and statin.    Carotid Stenosis Carotid dopplers in 01/2019 showed 40-59% stenosis of left ICA and 1-39% stenosis of right ICA.  - Continue aspirin  and statin.  - Discussed repeating carotid dopplers for routine screening. Patient/ husband would like me to discuss with Dr. Jeffrie to see what he recommends which I will do.   Hypertension BP 150/60 in the office  today. However, she states it was 132/54 at Nephrologist's office earlier today. Reasonably well controlled given advanced age. - Continue current medications: Amlodipine  5mg  daily, Imdur  90mg  daily, Losartan  25mg  daily, and Toprol -XL 25mg  daily. - Given advanced age, okay with BP of <150/90.   Hyperlipidemia Lipid panel in 11/2023 (per [facility]): Total Cholesterol 146, Triglycerides 195, HDL  48, LDL 66. - Continue Lipitor  80mg  daily.    CKD Stage IIIb Baseline creatine around 1.2 to 1.5. - She states she just had labs rechecked at PCP's office a couple of weeks ago. Will asked these to be faxed to us . - Followed by Nephrology.  Disposition: Follow up in 4-5 months with Dr. Jeffrie.   Signed, Aline FORBES Door, PA-C  06/09/2024 2:54 PM    [facility]

## 2024-06-09 ENCOUNTER — Ambulatory Visit: Payer: Medicare (Managed Care) | Attending: Student | Admitting: Student

## 2024-06-09 ENCOUNTER — Encounter: Payer: Self-pay | Admitting: Student

## 2024-06-09 VITALS — BP 150/60 | HR 62 | Ht 60.0 in | Wt 143.0 lb

## 2024-06-09 DIAGNOSIS — Z951 Presence of aortocoronary bypass graft: Secondary | ICD-10-CM

## 2024-06-09 DIAGNOSIS — I2510 Atherosclerotic heart disease of native coronary artery without angina pectoris: Secondary | ICD-10-CM | POA: Diagnosis not present

## 2024-06-09 DIAGNOSIS — N2581 Secondary hyperparathyroidism of renal origin: Secondary | ICD-10-CM | POA: Diagnosis not present

## 2024-06-09 DIAGNOSIS — I5032 Chronic diastolic (congestive) heart failure: Secondary | ICD-10-CM

## 2024-06-09 DIAGNOSIS — N184 Chronic kidney disease, stage 4 (severe): Secondary | ICD-10-CM | POA: Diagnosis not present

## 2024-06-09 DIAGNOSIS — I10 Essential (primary) hypertension: Secondary | ICD-10-CM | POA: Diagnosis not present

## 2024-06-09 DIAGNOSIS — I6523 Occlusion and stenosis of bilateral carotid arteries: Secondary | ICD-10-CM

## 2024-06-09 DIAGNOSIS — I120 Hypertensive chronic kidney disease with stage 5 chronic kidney disease or end stage renal disease: Secondary | ICD-10-CM | POA: Diagnosis not present

## 2024-06-09 DIAGNOSIS — I471 Supraventricular tachycardia, unspecified: Secondary | ICD-10-CM | POA: Diagnosis not present

## 2024-06-09 DIAGNOSIS — N185 Chronic kidney disease, stage 5: Secondary | ICD-10-CM | POA: Diagnosis not present

## 2024-06-09 DIAGNOSIS — E872 Acidosis, unspecified: Secondary | ICD-10-CM | POA: Diagnosis not present

## 2024-06-09 DIAGNOSIS — N1832 Chronic kidney disease, stage 3b: Secondary | ICD-10-CM

## 2024-06-09 DIAGNOSIS — E559 Vitamin D deficiency, unspecified: Secondary | ICD-10-CM | POA: Diagnosis not present

## 2024-06-09 DIAGNOSIS — E785 Hyperlipidemia, unspecified: Secondary | ICD-10-CM | POA: Diagnosis not present

## 2024-06-09 DIAGNOSIS — E875 Hyperkalemia: Secondary | ICD-10-CM | POA: Diagnosis not present

## 2024-06-09 DIAGNOSIS — C3490 Malignant neoplasm of unspecified part of unspecified bronchus or lung: Secondary | ICD-10-CM | POA: Diagnosis not present

## 2024-06-09 DIAGNOSIS — I491 Atrial premature depolarization: Secondary | ICD-10-CM | POA: Diagnosis not present

## 2024-06-09 DIAGNOSIS — D631 Anemia in chronic kidney disease: Secondary | ICD-10-CM | POA: Diagnosis not present

## 2024-06-09 DIAGNOSIS — I129 Hypertensive chronic kidney disease with stage 1 through stage 4 chronic kidney disease, or unspecified chronic kidney disease: Secondary | ICD-10-CM | POA: Diagnosis not present

## 2024-06-09 NOTE — Patient Instructions (Signed)
 Medication Instructions:  Your physician has recommended you make the following change in your medication:  INCREASE LASIX  TO DAILY FOR 3 DAYS AND THEN BACK TO AS NEEDED. Please make sure to weigh daily and if your weight increases 3 lbs or more in 1 day  or 5 lbs in 1 week call our office to let us  know [phone].   *If you need a refill on your cardiac medications before your next appointment, please call your pharmacy*  Lab Work: none If you have labs (blood work) drawn today and your tests are completely normal, you will receive your results only by: MyChart Message (if you have MyChart) OR A paper copy in the mail If you have any lab test that is abnormal or we need to change your treatment, we will call you to review the results.  Testing/Procedures: none  Follow-Up: At [facility], you and your health needs are our priority.  As part of our continuing mission to provide you with exceptional heart care, our providers are all part of one team.  This team includes your primary Cardiologist (physician) and Advanced Practice Providers or APPs (Physician Assistants and Nurse Practitioners) who all work together to provide you with the care you need, when you need it.  Your next appointment:   4-5 month(s)  Provider:   Oneil Parchment, MD  We recommend signing up for the patient portal called MyChart.  Sign up information is provided on this After Visit Summary.  MyChart is used to connect with patients for Virtual Visits (Telemedicine).  Patients are able to view lab/test results, encounter notes, upcoming appointments, etc.  Non-urgent messages can be sent to your provider as well.   To learn more about what you can do with MyChart, go to [email or web address].

## 2024-06-24 DIAGNOSIS — L570 Actinic keratosis: Secondary | ICD-10-CM | POA: Diagnosis not present

## 2024-07-08 DIAGNOSIS — Z23 Encounter for immunization: Secondary | ICD-10-CM | POA: Diagnosis not present

## 2024-07-08 DIAGNOSIS — G894 Chronic pain syndrome: Secondary | ICD-10-CM | POA: Diagnosis not present

## 2024-07-08 DIAGNOSIS — M79672 Pain in left foot: Secondary | ICD-10-CM | POA: Diagnosis not present

## 2024-07-08 DIAGNOSIS — R609 Edema, unspecified: Secondary | ICD-10-CM | POA: Diagnosis not present

## 2024-07-08 DIAGNOSIS — Z6827 Body mass index (BMI) 27.0-27.9, adult: Secondary | ICD-10-CM | POA: Diagnosis not present

## 2024-07-08 DIAGNOSIS — M19049 Primary osteoarthritis, unspecified hand: Secondary | ICD-10-CM | POA: Diagnosis not present

## 2024-07-08 DIAGNOSIS — M549 Dorsalgia, unspecified: Secondary | ICD-10-CM | POA: Diagnosis not present

## 2024-07-08 DIAGNOSIS — N184 Chronic kidney disease, stage 4 (severe): Secondary | ICD-10-CM | POA: Diagnosis not present

## 2024-07-08 DIAGNOSIS — R197 Diarrhea, unspecified: Secondary | ICD-10-CM | POA: Diagnosis not present

## 2024-08-03 ENCOUNTER — Other Ambulatory Visit: Payer: Self-pay | Admitting: Family Medicine

## 2024-08-03 DIAGNOSIS — Z1231 Encounter for screening mammogram for malignant neoplasm of breast: Secondary | ICD-10-CM

## 2024-08-09 ENCOUNTER — Emergency Department (HOSPITAL_BASED_OUTPATIENT_CLINIC_OR_DEPARTMENT_OTHER): Payer: Medicare (Managed Care)

## 2024-08-09 ENCOUNTER — Other Ambulatory Visit: Payer: Self-pay

## 2024-08-09 ENCOUNTER — Emergency Department (HOSPITAL_BASED_OUTPATIENT_CLINIC_OR_DEPARTMENT_OTHER)
Admission: EM | Admit: 2024-08-09 | Discharge: 2024-08-09 | Disposition: A | Discharge status: Home / Self Care | Payer: Medicare (Managed Care) | Attending: Emergency Medicine | Admitting: Emergency Medicine

## 2024-08-09 DIAGNOSIS — I2510 Atherosclerotic heart disease of native coronary artery without angina pectoris: Secondary | ICD-10-CM | POA: Diagnosis not present

## 2024-08-09 DIAGNOSIS — J40 Bronchitis, not specified as acute or chronic: Secondary | ICD-10-CM | POA: Diagnosis not present

## 2024-08-09 DIAGNOSIS — Z79899 Other long term (current) drug therapy: Secondary | ICD-10-CM | POA: Insufficient documentation

## 2024-08-09 DIAGNOSIS — R4182 Altered mental status, unspecified: Secondary | ICD-10-CM | POA: Diagnosis present

## 2024-08-09 DIAGNOSIS — Z7982 Long term (current) use of aspirin: Secondary | ICD-10-CM | POA: Diagnosis not present

## 2024-08-09 DIAGNOSIS — J849 Interstitial pulmonary disease, unspecified: Secondary | ICD-10-CM | POA: Insufficient documentation

## 2024-08-09 DIAGNOSIS — R059 Cough, unspecified: Secondary | ICD-10-CM | POA: Diagnosis present

## 2024-08-09 DIAGNOSIS — I10 Essential (primary) hypertension: Secondary | ICD-10-CM | POA: Insufficient documentation

## 2024-08-09 DIAGNOSIS — R509 Fever, unspecified: Secondary | ICD-10-CM | POA: Diagnosis present

## 2024-08-09 LAB — CBC
HCT: 36.3 % (ref 36.0–46.0)
Hemoglobin: 12 g/dL (ref 12.0–15.0)
MCH: 32.6 pg (ref 26.0–34.0)
MCHC: 33.1 g/dL (ref 30.0–36.0)
MCV: 98.6 fL (ref 80.0–100.0)
Platelets: 144 K/uL — ABNORMAL LOW (ref 150–400)
RBC: 3.68 MIL/uL — ABNORMAL LOW (ref 3.87–5.11)
RDW: 13.2 % (ref 11.5–15.5)
WBC: 9.4 K/uL (ref 4.0–10.5)
nRBC: 0 % (ref 0.0–0.2)

## 2024-08-09 LAB — COMPREHENSIVE METABOLIC PANEL WITH GFR
ALT: 6 U/L (ref 0–44)
AST: 22 U/L (ref 15–41)
Albumin: 4.8 g/dL (ref 3.5–5.0)
Alkaline Phosphatase: 90 U/L (ref 38–126)
Anion gap: 13 (ref 5–15)
BUN: 28 mg/dL — ABNORMAL HIGH (ref 8–23)
CO2: 27 mmol/L (ref 22–32)
Calcium: 11.3 mg/dL — ABNORMAL HIGH (ref 8.9–10.3)
Chloride: 100 mmol/L (ref 98–111)
Creatinine, Ser: 1.86 mg/dL — ABNORMAL HIGH (ref 0.44–1.00)
GFR, Estimated: 25 mL/min — ABNORMAL LOW (ref >60)
Glucose, Bld: 104 mg/dL — ABNORMAL HIGH (ref 70–99)
Potassium: 4.8 mmol/L (ref 3.5–5.1)
Sodium: 140 mmol/L (ref 135–145)
Total Bilirubin: 0.5 mg/dL (ref 0.0–1.2)
Total Protein: 8.6 g/dL — ABNORMAL HIGH (ref 6.5–8.1)

## 2024-08-09 LAB — URINALYSIS, ROUTINE W REFLEX MICROSCOPIC
Bilirubin Urine: NEGATIVE
Glucose, UA: NEGATIVE mg/dL
Hgb urine dipstick: NEGATIVE
Ketones, ur: NEGATIVE mg/dL
Leukocytes,Ua: NEGATIVE
Nitrite: NEGATIVE
Specific Gravity, Urine: 1.015 (ref 1.005–1.030)
pH: 6.5 (ref 5.0–8.0)

## 2024-08-09 LAB — RESP PANEL BY RT-PCR (RSV, FLU A&B, COVID)  RVPGX2
Influenza A by PCR: NEGATIVE
Influenza B by PCR: NEGATIVE
Resp Syncytial Virus by PCR: NEGATIVE
SARS Coronavirus 2 by RT PCR: NEGATIVE

## 2024-08-09 MED ORDER — AZITHROMYCIN 250 MG PO TABS
500.0000 mg | ORAL_TABLET | Freq: Once | ORAL | Status: AC
  Administered 2024-08-09: 500 mg via ORAL
  Filled 2024-08-09: qty 2

## 2024-08-09 MED ORDER — METHYLPREDNISOLONE SODIUM SUCC 125 MG IJ SOLR
125.0000 mg | Freq: Once | INTRAMUSCULAR | Status: AC
  Administered 2024-08-09: 125 mg via INTRAVENOUS
  Filled 2024-08-09: qty 2

## 2024-08-09 MED ORDER — AZITHROMYCIN 250 MG PO TABS: 250.0000 mg | ORAL_TABLET | Freq: Every day | ORAL | 0 refills | Status: AC

## 2024-08-09 MED ORDER — IPRATROPIUM-ALBUTEROL 0.5-2.5 (3) MG/3ML IN SOLN
3.0000 mL | Freq: Once | RESPIRATORY_TRACT | Status: AC
  Administered 2024-08-09: 3 mL via RESPIRATORY_TRACT
  Filled 2024-08-09: qty 3

## 2024-08-09 MED ORDER — PREDNISONE 20 MG PO TABS: 40.0000 mg | ORAL_TABLET | Freq: Every day | ORAL | 0 refills | Status: AC

## 2024-08-09 NOTE — Discharge Instructions (Signed)
 Take next dose of antibiotic and steroid tomorrow.  Follow-up with your primary care doctor.  Return if symptoms worsen.

## 2024-08-09 NOTE — ED Provider Notes (Addendum)
 [place] EMERGENCY DEPARTMENT AT [facility] Provider Note   CSN: [id number] Arrival date & time: 08/09/24  1900     Patient presents with: Fever, Cough, and Altered Mental Status   Cindy [name] is a 89 y.o. female.   Patient here with cough fever chills body aches for the last several days.  Nothing makes it worse or better.  Has any pain with urination.  History of bronchitis hypertension and interstitial lung disease.  Uses inhalers at home.  She denies any chest pain shortness of breath weakness numbness tingling.  The history is provided by the patient.       Prior to Admission medications   Medication Sig Start Date End Date Taking? Authorizing Provider  azithromycin  (ZITHROMAX ) 250 MG tablet Take 1 tablet (250 mg total) by mouth daily for 4 days. Take first 2 tablets together, then 1 every day until finished. 08/09/24 08/13/24 Yes Jessilyn Catino, DO  predniSONE  (DELTASONE ) 20 MG tablet Take 2 tablets (40 mg total) by mouth daily for 5 days. 08/09/24 08/14/24 Yes Maquita Sandoval, DO  albuterol  (VENTOLIN  HFA) 108 (90 Base) MCG/ACT inhaler Inhale 1 puff into the lungs every 4 (four) hours as needed for wheezing or shortness of breath. 03/14/23   Raenelle Coria, MD  amLODipine  (NORVASC ) 5 MG tablet Take 1 tablet (5 mg total) by mouth daily. 03/24/24   Goodrich, Callie E, PA-C  aspirin  81 MG tablet Take 81 mg by mouth daily.     [provider]  atorvastatin  (LIPITOR ) 80 MG tablet Take 1 tablet (80 mg total) by mouth daily. 12/25/18   Hammond, Janine, NP  benzonatate  (TESSALON ) 200 MG capsule Take 1 capsule by mouth twice daily as needed for cough 09/23/20   Jude Harden GAILS, MD  Calcium  Carbonate-Vitamin D 600-200 MG-UNIT TABS Take 1 tablet by mouth 2 (two) times daily.    [provider]  ferrous sulfate  325 (65 FE) MG tablet Take 325 mg by mouth daily with breakfast.    [provider]  fexofenadine  (ALLEGRA ) 180 MG tablet Take 1 tablet (180 mg  total) by mouth daily. 01/28/23   Jude Harden GAILS, MD  furosemide  (LASIX ) 40 MG tablet Take 1 tablet (40 mg total) by mouth daily as needed (for weight gain 3 lbs overnight or 5 lbs in 1 week, or for increased swelling in legs/feet). 03/24/24   Goodrich, Callie E, PA-C  guaifenesin  (HUMIBID E) 400 MG TABS tablet Take 400 mg by mouth in the morning and at bedtime. 07/02/23   [provider]  ipratropium (ATROVENT ) 0.06 % nasal spray Place 2 sprays into both nostrils 3 (three) times daily as needed for rhinitis.    [provider]  isosorbide  mononitrate (IMDUR ) 30 MG 24 hr tablet Take 90 mg by mouth daily. 11/13/21   [provider]  levothyroxine  (SYNTHROID , LEVOTHROID) 175 MCG tablet Take 175 mcg by mouth at bedtime.    [provider]  losartan  (COZAAR ) 25 MG tablet Take 1 tablet (25 mg total) by mouth daily. 02/16/24 02/15/25  Drusilla Sabas RAMAN, MD  MAGNESIUM  PO Take 400 mg by mouth daily.    [provider]  metoprolol  succinate (TOPROL -XL) 25 MG 24 hr tablet Take 1 tablet (25 mg total) by mouth daily. 02/14/23   Jeffrie Oneil BROCKS, MD  montelukast  (SINGULAIR ) 10 MG tablet Take 10 mg by mouth daily. 08/31/20   [provider]  Multiple Vitamins-Minerals (ICAPS AREDS 2 PO) Take 1 capsule by mouth in the morning  and at bedtime.    [provider]  Niacinamide-Zn-Cu-Methfo-Se-Cr (NICOTINAMIDE PO) Take 1 capsule by mouth 2 (two) times daily.    [provider]  nitroGLYCERIN  (NITROSTAT ) 0.4 MG SL tablet PLACE 1 TABLET UNDER THE TONGUE EVERY 5 MINUTES AS NEEDED FOR CHEST PAIN 09/11/19   Jeffrie Oneil BROCKS, MD  pantoprazole  (PROTONIX ) 40 MG tablet Take 40 mg by mouth 2 (two) times daily.    [provider]  TART CHERRY PO Take 1 tablet by mouth 2 (two) times a week. M, F    [provider]  traMADol  (ULTRAM ) 50 MG tablet Take 50 mg by mouth 2 (two) times daily.    [provider]    Allergies: Codeine    Review of  Systems  Updated Vital Signs BP (!) 164/63 (BP Location: Left Arm)   Pulse 75   Temp 98.6 F (37 C) (Oral)   Resp 18   LMP  (LMP Unknown)   SpO2 99%   Physical Exam Vitals and nursing note reviewed.  Constitutional:      General: She is not in acute distress.    Appearance: She is well-developed. She is not ill-appearing.  HENT:     Head: Normocephalic and atraumatic.     Nose: Nose normal.     Mouth/Throat:     Mouth: Mucous membranes are moist.  Eyes:     Extraocular Movements: Extraocular movements intact.     Conjunctiva/sclera: Conjunctivae normal.     Pupils: Pupils are equal, round, and reactive to light.  Cardiovascular:     Rate and Rhythm: Normal rate and regular rhythm.     Pulses: Normal pulses.     Heart sounds: Normal heart sounds. No murmur heard. Pulmonary:     Effort: Pulmonary effort is normal. No respiratory distress.     Breath sounds: Wheezing present.  Abdominal:     Palpations: Abdomen is soft.     Tenderness: There is no abdominal tenderness.  Musculoskeletal:        General: No swelling.     Cervical back: Normal range of motion and neck supple.  Skin:    General: Skin is warm and dry.     Capillary Refill: Capillary refill takes less than 2 seconds.  Neurological:     General: No focal deficit present.     Mental Status: She is alert and oriented to person, place, and time.     Cranial Nerves: No cranial nerve deficit.     Sensory: No sensory deficit.     Motor: No weakness.     Coordination: Coordination normal.  Psychiatric:        Mood and Affect: Mood normal.     (all labs ordered are listed, but only abnormal results are displayed) Labs Reviewed  COMPREHENSIVE METABOLIC PANEL WITH GFR - Abnormal; Notable for the following components:      Result Value   Glucose, Bld 104 (*)    BUN 28 (*)    Creatinine, Ser 1.86 (*)    Calcium  11.3 (*)    Total Protein 8.6 (*)    GFR, Estimated 25 (*)    All other components within normal  limits  CBC - Abnormal; Notable for the following components:   RBC 3.68 (*)    Platelets 144 (*)    All other components within normal limits  URINALYSIS, ROUTINE W REFLEX MICROSCOPIC - Abnormal; Notable for the following components:   Protein, ur TRACE (*)    All other  components within normal limits  RESP PANEL BY RT-PCR (RSV, FLU A&B, COVID)  [id number]    EKG: None  Radiology: DG Chest Port 1 View Result Date: 08/09/2024 EXAM: 1 VIEW XRAY OF THE CHEST 08/09/2024 07:55:00 PM COMPARISON: Comparison with 02/11/2024. CLINICAL HISTORY: Altered mental status, fever, cough, and some altered mental status. FINDINGS: LUNGS AND PLEURA: Diffuse interstitial coarsening. The left basilar atelectasis or scarring. No pulmonary edema. No pleural effusion. No pneumothorax. HEART AND MEDIASTINUM: Stable cardiomediastinal silhouette. Sternotomy. Similar fracture of the most inferior sternal wire. Aortic atherosclerotic calcification. BONES AND SOFT TISSUES: No acute osseous abnormality. IMPRESSION: 1. No acute cardiopulmonary process. Electronically signed by: Norman Gatlin MD 08/09/2024 08:02 PM EDT RP Workstation: [id number]     Procedures   Medications Ordered in the ED  azithromycin  (ZITHROMAX ) tablet 500 mg (has no administration in time range)  ipratropium-albuterol  (DUONEB) 0.5-2.5 (3) MG/3ML nebulizer solution 3 mL (3 mLs Nebulization Given 08/09/24 2040)  methylPREDNISolone  sodium succinate  (SOLU-MEDROL ) 125 mg/2 mL injection 125 mg (125 mg Intravenous Given 08/09/24 2035)                                    Medical Decision Making Amount and/or Complexity of Data Reviewed Labs: ordered. Radiology: ordered.  Risk Prescription drug management.   [name] is here with cough fever chills body aches.  History of bronchitis hypertension CAD.  She is well-appearing.  Neurologically intact.  Differential diagnosis likely viral process versus bronchitis versus reactive airway process  versus pneumonia.  I have no concern for ACS or PE.  EKG shows sinus rhythm.  No ischemic changes.  She has had low-grade temperatures but has not had a definitive fever.  Basic labs were obtained including CBC CMP viral panel urinalysis were unremarkable.  No significant leukocytosis anemia or electrolyte abnormality per my review and interpretation.  Urinalysis is negative for infection.  COVID flu RSV test is negative.  Chest x-ray shows no evidence of pneumonia or pneumothorax.  Overall patient was given breathing treatment antibiotic steroids here in the ED.  I do suspect that she has a bronchitis/viral process.  Vital signs are reassuring.  Have no concern for sepsis.  Will continue prednisone  and Z-Pak at home.  Follow-up with primary care.  Return if symptoms worsen.  This chart was dictated using voice recognition software.  Despite best efforts to proofread,  errors can occur which can change the documentation meaning.      Final diagnoses:  Bronchitis    ED Discharge Orders          Ordered    azithromycin  (ZITHROMAX ) 250 MG tablet  Daily        08/09/24 2132    predniSONE  (DELTASONE ) 20 MG tablet  Daily        08/09/24 2132               Ruthe Cornet, DO 08/09/24 2137    Ruthe Cornet, DO 08/09/24 2138

## 2024-08-09 NOTE — ED Triage Notes (Signed)
 Patient reports fever, cough, and some altered mental status. Family reports that she has been seen here before for similar events. It typically is either a pneumonia or a UTI.

## 2024-08-11 DIAGNOSIS — J40 Bronchitis, not specified as acute or chronic: Secondary | ICD-10-CM | POA: Diagnosis not present

## 2024-08-11 DIAGNOSIS — Z6827 Body mass index (BMI) 27.0-27.9, adult: Secondary | ICD-10-CM | POA: Diagnosis not present

## 2024-08-11 DIAGNOSIS — R609 Edema, unspecified: Secondary | ICD-10-CM | POA: Diagnosis not present

## 2024-08-11 DIAGNOSIS — Z09 Encounter for follow-up examination after completed treatment for conditions other than malignant neoplasm: Secondary | ICD-10-CM | POA: Diagnosis not present

## 2024-08-21 ENCOUNTER — Ambulatory Visit
Admission: RE | Admit: 2024-08-21 | Discharge: 2024-08-21 | Disposition: A | Discharge status: Home / Self Care | Payer: Medicare (Managed Care) | Source: Ambulatory Visit | Attending: Family Medicine | Admitting: Family Medicine

## 2024-08-21 DIAGNOSIS — Z1231 Encounter for screening mammogram for malignant neoplasm of breast: Secondary | ICD-10-CM

## 2024-09-30 DIAGNOSIS — L570 Actinic keratosis: Secondary | ICD-10-CM | POA: Diagnosis not present

## 2024-09-30 DIAGNOSIS — L578 Other skin changes due to chronic exposure to nonionizing radiation: Secondary | ICD-10-CM | POA: Diagnosis not present

## 2024-09-30 DIAGNOSIS — L821 Other seborrheic keratosis: Secondary | ICD-10-CM | POA: Diagnosis not present

## 2025-01-20 ENCOUNTER — Ambulatory Visit: Admitting: Podiatry
